# Patient Record
Sex: Female | Born: 1945
Health system: Southern US, Community
[De-identification: ages and names within clinical notes are randomized; demographics above are authoritative.]

## PROBLEM LIST (undated history)

## (undated) DIAGNOSIS — Z860100 Personal history of colon polyps, unspecified: Secondary | ICD-10-CM

## (undated) DIAGNOSIS — I5030 Unspecified diastolic (congestive) heart failure: Secondary | ICD-10-CM

## (undated) DIAGNOSIS — I2510 Atherosclerotic heart disease of native coronary artery without angina pectoris: Secondary | ICD-10-CM

## (undated) DIAGNOSIS — Z8701 Personal history of pneumonia (recurrent): Secondary | ICD-10-CM

## (undated) DIAGNOSIS — Z87898 Personal history of other specified conditions: Secondary | ICD-10-CM

## (undated) DIAGNOSIS — I10 Essential (primary) hypertension: Secondary | ICD-10-CM

## (undated) DIAGNOSIS — L853 Xerosis cutis: Secondary | ICD-10-CM

## (undated) DIAGNOSIS — M549 Dorsalgia, unspecified: Secondary | ICD-10-CM

## (undated) DIAGNOSIS — M069 Rheumatoid arthritis, unspecified: Secondary | ICD-10-CM

## (undated) DIAGNOSIS — R238 Other skin changes: Secondary | ICD-10-CM

## (undated) DIAGNOSIS — E785 Hyperlipidemia, unspecified: Secondary | ICD-10-CM

## (undated) DIAGNOSIS — I6529 Occlusion and stenosis of unspecified carotid artery: Secondary | ICD-10-CM

## (undated) DIAGNOSIS — R05 Cough: Secondary | ICD-10-CM

## (undated) DIAGNOSIS — I739 Peripheral vascular disease, unspecified: Secondary | ICD-10-CM

## (undated) DIAGNOSIS — R059 Cough, unspecified: Secondary | ICD-10-CM

## (undated) DIAGNOSIS — Z720 Tobacco use: Secondary | ICD-10-CM

## (undated) DIAGNOSIS — Z8669 Personal history of other diseases of the nervous system and sense organs: Secondary | ICD-10-CM

## (undated) DIAGNOSIS — J189 Pneumonia, unspecified organism: Secondary | ICD-10-CM

## (undated) DIAGNOSIS — F410 Panic disorder [episodic paroxysmal anxiety] without agoraphobia: Secondary | ICD-10-CM

## (undated) DIAGNOSIS — Z862 Personal history of diseases of the blood and blood-forming organs and certain disorders involving the immune mechanism: Secondary | ICD-10-CM

## (undated) HISTORY — PX: TRIGGER FINGER RELEASE: SHX641

## (undated) HISTORY — PX: BACK SURGERY: SHX140

## (undated) HISTORY — PX: CHOLECYSTECTOMY: SHX55

## (undated) HISTORY — PX: ESOPHAGOGASTRODUODENOSCOPY: SHX1529

## (undated) HISTORY — DX: Personal history of diseases of the blood and blood-forming organs and certain disorders involving the immune mechanism: Z86.2

## (undated) HISTORY — DX: Essential (primary) hypertension: I10

## (undated) HISTORY — DX: Hyperlipidemia, unspecified: E78.5

## (undated) HISTORY — PX: CORONARY ANGIOPLASTY: SHX604

## (undated) HISTORY — PX: ABDOMINAL HYSTERECTOMY: SHX81

## (undated) HISTORY — DX: Unspecified diastolic (congestive) heart failure: I50.30

## (undated) HISTORY — PX: OTHER SURGICAL HISTORY: SHX169

## (undated) HISTORY — PX: CARPAL TUNNEL RELEASE: SHX101

## (undated) HISTORY — DX: Occlusion and stenosis of unspecified carotid artery: I65.29

## (undated) HISTORY — DX: Personal history of other specified conditions: Z87.898

## (undated) HISTORY — PX: COLONOSCOPY: SHX174

## (undated) HISTORY — PX: TOTAL KNEE ARTHROPLASTY: SHX125

## (undated) HISTORY — PX: GIVENS CAPSULE STUDY: SHX5432

## (undated) HISTORY — DX: Personal history of pneumonia (recurrent): Z87.01

---

## 1998-06-09 ENCOUNTER — Encounter: Admission: RE | Admit: 1998-06-09 | Discharge: 1998-09-07 | Payer: Self-pay | Admitting: Anesthesiology

## 1999-03-01 ENCOUNTER — Encounter: Payer: Self-pay | Admitting: Neurological Surgery

## 1999-03-01 ENCOUNTER — Ambulatory Visit (HOSPITAL_COMMUNITY): Admission: RE | Admit: 1999-03-01 | Discharge: 1999-03-01 | Payer: Self-pay | Admitting: Neurological Surgery

## 1999-04-06 ENCOUNTER — Encounter: Payer: Self-pay | Admitting: Neurological Surgery

## 1999-04-08 ENCOUNTER — Encounter: Payer: Self-pay | Admitting: Neurological Surgery

## 1999-04-08 ENCOUNTER — Inpatient Hospital Stay (HOSPITAL_COMMUNITY): Admission: RE | Admit: 1999-04-08 | Discharge: 1999-04-14 | Payer: Self-pay | Admitting: Neurological Surgery

## 1999-05-07 ENCOUNTER — Encounter: Admission: RE | Admit: 1999-05-07 | Discharge: 1999-05-07 | Payer: Self-pay | Admitting: Neurological Surgery

## 1999-05-07 ENCOUNTER — Encounter: Payer: Self-pay | Admitting: Neurological Surgery

## 1999-06-11 ENCOUNTER — Encounter: Payer: Self-pay | Admitting: Neurological Surgery

## 1999-06-11 ENCOUNTER — Encounter: Admission: RE | Admit: 1999-06-11 | Discharge: 1999-06-11 | Payer: Self-pay | Admitting: Neurological Surgery

## 1999-08-06 ENCOUNTER — Ambulatory Visit (HOSPITAL_COMMUNITY): Admission: RE | Admit: 1999-08-06 | Discharge: 1999-08-06 | Payer: Self-pay | Admitting: Neurological Surgery

## 1999-08-06 ENCOUNTER — Encounter: Payer: Self-pay | Admitting: Neurological Surgery

## 2003-03-07 ENCOUNTER — Ambulatory Visit (HOSPITAL_COMMUNITY): Admission: RE | Admit: 2003-03-07 | Discharge: 2003-03-07 | Payer: Self-pay | Admitting: Internal Medicine

## 2003-05-22 ENCOUNTER — Inpatient Hospital Stay (HOSPITAL_COMMUNITY): Admission: AD | Admit: 2003-05-22 | Discharge: 2003-05-23 | Payer: Self-pay | Admitting: Neurology

## 2003-12-22 ENCOUNTER — Ambulatory Visit (HOSPITAL_COMMUNITY): Admission: RE | Admit: 2003-12-22 | Discharge: 2003-12-22 | Payer: Self-pay | Admitting: Cardiovascular Disease

## 2003-12-29 ENCOUNTER — Ambulatory Visit (HOSPITAL_COMMUNITY): Admission: RE | Admit: 2003-12-29 | Discharge: 2003-12-29 | Payer: Self-pay | Admitting: Cardiovascular Disease

## 2004-01-27 ENCOUNTER — Ambulatory Visit (HOSPITAL_COMMUNITY): Admission: RE | Admit: 2004-01-27 | Discharge: 2004-01-28 | Payer: Self-pay | Admitting: Cardiovascular Disease

## 2004-02-18 ENCOUNTER — Emergency Department (HOSPITAL_COMMUNITY): Admission: EM | Admit: 2004-02-18 | Discharge: 2004-02-18 | Payer: Self-pay | Admitting: Emergency Medicine

## 2004-03-09 ENCOUNTER — Emergency Department (HOSPITAL_COMMUNITY): Admission: EM | Admit: 2004-03-09 | Discharge: 2004-03-09 | Payer: Self-pay | Admitting: Emergency Medicine

## 2004-03-21 HISTORY — PX: OTHER SURGICAL HISTORY: SHX169

## 2004-03-25 ENCOUNTER — Ambulatory Visit: Payer: Self-pay | Admitting: Internal Medicine

## 2004-04-09 ENCOUNTER — Ambulatory Visit (HOSPITAL_COMMUNITY): Admission: RE | Admit: 2004-04-09 | Discharge: 2004-04-09 | Payer: Self-pay | Admitting: Internal Medicine

## 2004-04-09 ENCOUNTER — Ambulatory Visit: Payer: Self-pay | Admitting: Internal Medicine

## 2004-07-08 ENCOUNTER — Ambulatory Visit: Payer: Self-pay | Admitting: Internal Medicine

## 2004-07-19 HISTORY — PX: ESOPHAGOGASTRODUODENOSCOPY: SHX1529

## 2004-07-30 ENCOUNTER — Ambulatory Visit: Payer: Self-pay | Admitting: Internal Medicine

## 2004-07-30 ENCOUNTER — Ambulatory Visit (HOSPITAL_COMMUNITY): Admission: RE | Admit: 2004-07-30 | Discharge: 2004-07-30 | Payer: Self-pay | Admitting: Internal Medicine

## 2005-01-24 ENCOUNTER — Ambulatory Visit (HOSPITAL_COMMUNITY): Admission: RE | Admit: 2005-01-24 | Discharge: 2005-01-24 | Payer: Self-pay | Admitting: *Deleted

## 2005-01-31 ENCOUNTER — Observation Stay (HOSPITAL_COMMUNITY): Admission: RE | Admit: 2005-01-31 | Discharge: 2005-02-01 | Payer: Self-pay | Admitting: Cardiovascular Disease

## 2005-01-31 HISTORY — PX: CARDIAC CATHETERIZATION: SHX172

## 2005-10-10 ENCOUNTER — Ambulatory Visit (HOSPITAL_COMMUNITY): Admission: RE | Admit: 2005-10-10 | Discharge: 2005-10-10 | Payer: Self-pay | Admitting: Internal Medicine

## 2006-12-11 ENCOUNTER — Ambulatory Visit (HOSPITAL_COMMUNITY): Admission: RE | Admit: 2006-12-11 | Discharge: 2006-12-11 | Payer: Self-pay | Admitting: Internal Medicine

## 2007-04-25 ENCOUNTER — Inpatient Hospital Stay (HOSPITAL_COMMUNITY): Admission: AD | Admit: 2007-04-25 | Discharge: 2007-04-29 | Payer: Self-pay | Admitting: Internal Medicine

## 2007-04-25 ENCOUNTER — Ambulatory Visit (HOSPITAL_COMMUNITY): Admission: RE | Admit: 2007-04-25 | Discharge: 2007-04-25 | Payer: Self-pay | Admitting: Internal Medicine

## 2007-11-16 ENCOUNTER — Encounter (INDEPENDENT_AMBULATORY_CARE_PROVIDER_SITE_OTHER): Payer: Self-pay | Admitting: Internal Medicine

## 2007-11-21 ENCOUNTER — Ambulatory Visit: Payer: Self-pay | Admitting: Internal Medicine

## 2007-11-21 DIAGNOSIS — E785 Hyperlipidemia, unspecified: Secondary | ICD-10-CM | POA: Insufficient documentation

## 2007-11-21 DIAGNOSIS — S0990XA Unspecified injury of head, initial encounter: Secondary | ICD-10-CM | POA: Insufficient documentation

## 2007-11-22 ENCOUNTER — Encounter (INDEPENDENT_AMBULATORY_CARE_PROVIDER_SITE_OTHER): Payer: Self-pay | Admitting: Internal Medicine

## 2007-11-22 LAB — CONVERTED CEMR LAB
Amphetamine Screen, Ur: NEGATIVE
Barbiturate Quant, Ur: NEGATIVE
Benzodiazepines.: NEGATIVE
Cocaine Metabolites: NEGATIVE
Creatinine,U: 148.1 mg/dL
Marijuana Metabolite: NEGATIVE
Methadone: NEGATIVE
Opiate Screen, Urine: NEGATIVE
Phencyclidine (PCP): NEGATIVE
Propoxyphene: NEGATIVE

## 2008-01-17 ENCOUNTER — Encounter (INDEPENDENT_AMBULATORY_CARE_PROVIDER_SITE_OTHER): Payer: Self-pay | Admitting: Internal Medicine

## 2008-07-29 ENCOUNTER — Ambulatory Visit (HOSPITAL_COMMUNITY): Admission: RE | Admit: 2008-07-29 | Discharge: 2008-07-29 | Payer: Self-pay | Admitting: Internal Medicine

## 2009-10-19 ENCOUNTER — Emergency Department (HOSPITAL_COMMUNITY): Admission: EM | Admit: 2009-10-19 | Discharge: 2009-10-19 | Payer: Self-pay | Admitting: Surgery

## 2010-04-12 ENCOUNTER — Encounter: Payer: Self-pay | Admitting: Internal Medicine

## 2010-08-03 NOTE — H&P (Signed)
Leslie [name], Leslie [name]              ACCOUNT NO.:  [id number]   MEDICAL RECORD NO.:  [id number]          PATIENT TYPE:  INP   LOCATION:  A307                          FACILITY:  [facility]   PHYSICIAN:  Tesfaye D. Felecia Shelling, MD   DATE OF BIRTH:  Jun 28, 1945   DATE OF ADMISSION:  04/25/2007  DATE OF DISCHARGE:  LH                              HISTORY & PHYSICAL   CHIEF COMPLAINT:  Recurrent cough and congestion.   HISTORY OF PRESENT ILLNESS:  This is 65 year old, female patient with a  history of hypertension and hyperlipidemia came to the office with  recurrent cough of 3 days duration.  The patient was initially seen in  the office on April 23, 2007, and diagnosed as a case of possible  acute bronchitis and the patient was started on oral antibiotics and  antihistaminic medications.  She was also given a cough suppressant.  The patient continued the oral antibiotics and the rest of her  medications.  However, her symptoms got worse and the patient was sent  for x-ray.  An x-ray showed left lower lobe pneumonia.  Since the  patient has a history of longstanding tobacco smoking, she was admitted  for IV antibiotics and further evaluation.   REVIEW OF SYSTEMS:  The patient complains of discomfort on her left  chest and generalized weakness and loss of appetite.  No headache, fever  or chills, chest pain, nausea, vomiting, abdominal pain, dysuria,  urgency or frequency of urination.   PAST MEDICAL HISTORY:  1. Hyperlipidemia.  2. Hypertension.  3. History of recurrent bronchitis.  4. Headache.   CURRENT MEDICATIONS:  1. Norvasc 5 mg daily.  2. Lipitor 20 mg p.o. daily.   SOCIAL HISTORY:  The patient is single.  She smokes about one pack per  day.  No history of alcohol or substance abuse.   PHYSICAL EXAMINATION:  GENERAL:  The patient is alert, awake and sick-  looking.  VITAL SIGNS:  Blood pressure 120/70, pulse 106, respiratory rate 26,  temperature 98 degrees Fahrenheit.  HEENT:   Pupils are equal and reactive.  NECK:  Supple.  CHEST:  Decreased air entry.  There are rhonchi and crackles at the left  lung bases.  CARDIAC:  S1, S2 heard, tachycardic.  ABDOMEN:  Soft and lax.  Bowel sounds positive.  No mass or  organomegaly.  EXTREMITIES:  No leg edema.   ASSESSMENT:  1. Left lower lobe pneumonia.  2. History of hypertension.  3. Hyperlipidemia.  4. Nicotine addiction.   PLAN:  Will start the patient on combination of Zithromax and IV  Rocephin.  Will give also nebulizer treatments.  Will continue her  regular medications.      Tesfaye D. Felecia Shelling, MD  Electronically Signed     TDF/MEDQ  D:  04/26/2007  T:  04/27/2007  Job:  [id number]

## 2010-08-06 NOTE — Discharge Summary (Signed)
Leslie [name], Leslie [name]                        ACCOUNT NO.:  [id number]   MEDICAL RECORD NO.:  [id number]                   PATIENT TYPE:  INP   LOCATION:  2009                                 FACILITY:  [facility]   PHYSICIAN:  Pramod P. Pearlean Brownie, MD                 DATE OF BIRTH:  November 15, 1945   DATE OF ADMISSION:  05/22/2003  DATE OF DISCHARGE:  05/23/2003                                 DISCHARGE SUMMARY   DIAGNOSES AT TIME OF DISCHARGE:  1. Numbness secondary to migraine.  2. Dyslipidemia.  3. Tobacco abuse.  4. Hypertension.  5. Back pain.  6. Status post cholecystectomy.  7. Status post hysterectomy.  8. History of back surgery in 2001 by Dr. Danielle Dess.   MEDICATIONS AT TIME OF DISCHARGE:  1. Lotensin/hydrochlorothiazide 10/12.5 one-half tablet daily.  2. Lipitor 10 mg daily.  3. Elavil 25 mg at bedtime.  4. Aspirin 81 mg daily.   STUDIES PERFORMED:  1. CT of the brain on admission showed no significant abnormality, no acute     changes.  2. MRI of the brain shows no acute infarct.  3. MRA of the brain shows no large or medium-size vessels.  4. A 2-D echocardiogram was performed, results pending.  5. Transcranial Doppler performed, results pending.  6. Carotid Doppler showed right 60% to 80% ICA stenosis in the low mid range     of the scale; left, no ICA stenosis, and bilateral vertebral artery flow     antegrade.   LABORATORY STUDIES:  Hemoglobin A1C 6.1. Lipids with cholesterol of 177,  triglycerides 163, HDL 134, and LDL 110.  Homocysteine 11.35. Hemoglobin  12.6, hematocrit 37.6, white blood cell 7.7, and platelet count 348,000.  Sodium 138, potassium 3.5, chloride 103, CO2 26, BUN 13, creatinine 0.6,  glucose 98, calcium 9.5.  Coagulation studies normal. Cardiac enzymes  normal.   HISTORY OF PRESENT ILLNESS:  Leslie [name] is a 65 year old right-handed  black female who developed sudden onset of paresthesia of the right thigh  yesterday morning when she was going to  work. She was unable to go to work  and subsequently developed similar paresthesias affecting the right forearm  as well as the right ring and little fingers.  She presented to [facility]  emergency room and Dr. Rosalia Hammers called and requested transfer since their CT was  not working. She arrived at [facility] where she was evaluated and  admitted for further stroke workup.   The patient does have a history of migraine and as MRI, MRA, and CT were all  negative, it was felt paresthesias were secondary to migraine as they  resolved within 24 hours. The patient had no other identifiable risk factors  other than hyperlipidemia that was present prior to admission and then mild  right ICA stenosis that would just need follow up over time. Will go ahead  and discharge the patient home with  addition of aspirin 81 mg daily for  secondary stroke prevention. The patient did have white matter changes on  her MRI most often seen with history of migraines.   The patient does smoke and she needs to stop. She was counseled to do so and  offered free smoking classes.   CONDITION ON DISCHARGE:  Neurologically normal.   DISCHARGE PLAN:  1. Discharge home.  2. Add aspirin to daily medication regimen.  3. Follow up with Dr. Felecia Shelling in two months.  4. No need for neurological follow-up at this time.      Annie Main, N.P.                         Pramod P. Pearlean Brownie, MD    SB/MEDQ  D:  05/23/2003  T:  05/24/2003  Job:  [id number]   cc:   Pramod P. Pearlean Brownie, MD  Fax: [phone]   Tesfaye D. Felecia Shelling, M.D.  [address]  [place]  [place] [address]  Fax: [phone]

## 2010-08-06 NOTE — Consult Note (Signed)
Leslie [name], Leslie [name]              ACCOUNT NO.:  [id number]   MEDICAL RECORD NO.:  [id number]           PATIENT TYPE:   LOCATION:                                 FACILITY:   PHYSICIAN:  R. Roetta Sessions, M.D. DATE OF BIRTH:  1945/07/03   DATE OF CONSULTATION:  DATE OF DISCHARGE:                                   CONSULTATION   REASON FOR CONSULTATION:  Anemia and rectal bleeding.   HISTORY OF PRESENT ILLNESS:  Leslie [name] is a 65 year old female patient  of Dr. Letitia Neri.  She notes on March 09, 2004 she experienced dizziness at  work.  She was then taken to the emergency room, where she was found to have  anemia with a hemoglobin of 10.5 and a hematocrit of 31 with an MCV of 85.9.  She was evaluated at that time.  The hemoglobin had dropped from 12.7 and a  hematocrit of 40 from November 04, 2003.  Leslie [name] notes she had noticed  rectal bleeding of a moderate amount in her stool.  She notes the bleeding  was bright red.  She also notes external hemorrhoids.  She denied any  associated abdominal pain or melena.  She denied any nausea and vomiting.  She does have occasional heart burn and indigestion.  She had been started  on PPI therapy in November and the Prevacid caused her a rash.  Therefore,  it was discontinued.  She denies any history of odynophagia or dysphagia.  She denies any early satiety.  Notably she has been taking aspirin once  daily, 81 mg daily.  She also has been taking multiple Excedrin Migraine for  migraine headaches, anywhere from one tablet to six tablets daily on a  regular basis.   PAST MEDICAL HISTORY:  1.  Hypertension.  2.  Hypercholesterolemia.  3.  Migraine headaches.  4.  Colonoscopy in 1998 by Dr. Katrinka Blazing with a few polyps.  However, a biopsy      was not performed per the patient's report.   PAST SURGICAL HISTORY:  1.  She is status post PCI and stenting in her right leg.  2.  Status post vaginal hysterectomy about 30 years ago.   CURRENT  MEDICATIONS:  1.  Lotensin 12.5 mg 1/2 tablet daily.  2.  Lipitor 20 mg daily.  3.  Aspirin 81 mg daily.  4.  Excedrin Migraine anywhere from 1-6 tablets daily.  5.  Senokot daily.   ALLERGIES:  1.  PLAVIX.  2.  PREVACID, which caused a rash.   FAMILY HISTORY:  No known family history of colorectal carcinoma, liver, or  chronic GI problems.  Mother is deceased at age 49 secondary to a CVA.  Father is deceased in his early 14s secondary to brain carcinoma.  Multiple  siblings have a history significant for coronary artery disease, diabetes  mellitus, and hypertension.   SOCIAL HISTORY:  Leslie [name] is single.  She lives alone.  She has two  grown healthy children.  She is employed full-time with [employer].  She reports a 1/2 pack per day tobacco use over the  last 30 years.  She  denies any alcohol or drug use.   REVIEW OF SYSTEMS:  CONSTITUTIONAL:  Weight is stable.  Denies any anorexia.  Denies any fatigue.  NEUROLOGIC:  She does have significant migraine  headaches very frequently.  This has been evaluated last year with a  negative CT scan per her report in March.  She is being followed by Dr.  Felecia Shelling for this.  CARDIOVASCULAR:  Denies any chest pain or palpitations.  PULMONARY:  Denies any shortness of breath, dyspnea, cough, or hemoptysis.  GYNECOLOGICAL:  She does note a heavy vaginal discharge.  She has discussed  this with Dr. Felecia Shelling as well.  She notes the discharge is brownish with  possibly some bleeding.  GASTROINTESTINAL:  See HPI.   PHYSICAL EXAMINATION:  VITAL SIGNS:  Weight 151 pounds.  Temperature 97.4,  blood pressure 120/76, pulse 76.  GENERAL:  Leslie [name] is a 65 year old well-developed well-nourished  African American female who is alert, oriented, pleasant, and cooperative in  no acute distress.  HEENT:  Sclerae clear, nonicteric.  Conjunctivae pink.  Oropharynx pink and  moist without any lesions.  She does have upper and lower dentures  intact.  NECK:  Supple without any masses or thyromegaly.  HEART:  Regular rate and rhythm with normal S1 and S2 without any murmurs,  clicks, rubs, or gallops.  LUNGS:  Clear to auscultation bilaterally.  ABDOMEN:  Positive bowel sounds x4.  She does have left lower quadrant and  right lower quadrant soft bruits.  Otherwise abdomen is soft, nontender,  nondistended without palpable masses or hepatosplenomegaly.  No rebound  tenderness or guarding.  RECTAL:  She does have large protruding external hemorrhoids that are not  actively thrombosed or erythematous.  Good sphincter tone.  No internal  masses palpated.  A small amount of stool is Hemoccult negative from the  vault.  EXTREMITIES:  Show 2+ pedal pulses bilaterally.  SKIN:  Brown, warm, and dry without any rashes or jaundice.   LABORATORY DATA:  See HPI.   ASSESSMENT:  Leslie [name] is a 65 year old African American female with  about a 2 g drop in her hemoglobin recently.  Since this episode repeat labs  show her hemoglobin is back up to 11.8.  She is on a significant amount of  aspirin daily, an 81 mg tablet as well as an Excedrin Migraine which has 250  mg of aspirin per tablet.  She notes taking up to six of these per day.  She  is definitely at risk for developing a peptic ulcer disease.  She also has  noted intermittent bright red rectal bleeding with her stools for at least  the last 20 years that she can recall.  I would recommend a further  evaluation to rule out peptic ulcer disease, as well as lower  gastrointestinal bleeding.  She is instructed to discontinue the use of  Excedrin Migraine for now.   RECOMMENDATIONS:  1.  Discontinue Excedrin Migraine.  2.  We will schedule a colonoscopy and EGD with Dr. Jena Gauss in the near      future.  I discussed this procedure including the risks and benefits to      include but not limited to bleeding, infection, perforation, and drug     reaction.  She agrees and a consent will  be obtained.  3.  As far as her vaginal discharge and headaches are concerned she is to      follow up with Dr.  Fanta.  4.  We will hold the PPI for now given the fact that she had a rash with      Prevacid.  5.  Further recommendations pending the procedure and if she develops any      dizziness, profuse bleeding, or any of these signs or symptoms she is to      call our office or go immediately to the emergency room.   We would like to thank Dr. Felecia Shelling for allowing [place] to participate in the care  of Leslie [name].     Kand   KC/MEDQ  D:  03/25/2004  T:  03/25/2004  Job:  [id number]   cc:   Tesfaye D. Felecia Shelling, MD  [address]  [place]  [place] [address]  Fax: [phone]

## 2010-08-06 NOTE — Cardiovascular Report (Signed)
NAMEALANNIS, Leslie [name]              ACCOUNT NO.:  [id number]   MEDICAL RECORD NO.:  [id number]          PATIENT TYPE:  OIB   LOCATION:  2899                         FACILITY:  [facility]   PHYSICIAN:  Nanetta Batty, M.D.   DATE OF BIRTH:  17-Sep-1945   DATE OF PROCEDURE:  01/27/2004  DATE OF DISCHARGE:                              CARDIAC CATHETERIZATION   PROCEDURE:  Peripheral angiogram/PTA and stent procedure.   CARDIOLOGIST:  Nanetta Batty, M.D.   INDEPENDENT MEDICAL EVALUATION:  The patient is a 65 year old  African/American female, a patient of Dr. Ninetta Lights D. Fanta's and Dr. Rod Can.  She has a history of hypertension, hyperlipidemia and chronic  migraines.  She has had a negative Cardiolite, but a history of progressive  right lower extremity claudication.  Dopplers revealed an ABI of 0.87 with  high-grade stenosis, versus occlusion of the mid-right SFA.  She presents  now for an angiography and potential intervention.   DESCRIPTION OF PROCEDURE:  The patient is brought to the sixth floor [facility] Peripheral Vascular Angiographic suite in the post-  absorptive state.  She was premedicated with p.o. Valium and IV Versed.  Her  left groin was prepped and shaved in the usual sterile fashion.  Xylocaine  1% was used for local anesthesia.  A 5-French sheath was inserted into the  left femoral artery using the standard Seldinger technique.  A 5-French  pigtail catheter along with a 5-French cross-over catheter were used for a  mid-stream and distal abdominal aortography, with bifemoral runoff.  Visipaque dye was used for the entirety of the case.  Retrograde aortic  pressures monitored during the case.   ANGIOGRAPHIC RESULTS:  1.  Abdominal aorta      1.  Renal arteries:  A 50% ostial left renal artery stenosis.      2.  Infrarenal abdominal aorta:  Moderate atherosclerosis towards the          iliac bifurcation.  2.  Left lower extremity      1.   Ulcerated plaque at the origin of the left common iliac artery that          was not obstructive, and on the lateral border of the ostium.      2.  A 50% segmental mid-left SFA with three-vessel runoff.  3.  Right lower extremity      1.  First segment occlusion mid-right SFA with three-vessel runoff.   DESCRIPTION OF PROCEDURE:  A contralateral access was obtained with a cross-  over catheter and an 0.035 Wholey followed by a 0.035 diagonal glide wire.  The patient received 3000 units of heparin intravenously.  Following this, a  long 5-French end-hole catheter advanced over the [facility] to the mid-right  SFA.  The wire was exchanged for a 0.035 angled glide wire which was used to  cross the total occlusion into the distal SFA/popliteal.  A PTA was  performed with a 4 x 6 Power Flex using overlapping sequential inflations.  Following this, a 6 x 10 overlapping with 6 x 6 Smart stents were then  deployed  sequentially and post-dilated with a 5 x 8 Power Flex.  There was  some pain in her leg with balloon inflation at low pressures, which resolved  with balloon deflation.  The final angiographic result was a reduction of a  short segment of the mid-right SFA occlusion to 0% residual, with excellent  flow.  The patient tolerated the procedure well.  The contralateral sheath  was then removed, after an ACT was measured at 150.  Pressure was held on  the groin to achieve hemostasis.  The patient left the laboratory in stable  condition.   PLAN:  She be treated with aspirin and Plavix.  She will be hydrated and  discharged home in the morning, and will return to the office in  approximately two weeks after obtaining lower extremity post-procedure  Dopplers.  She left the laboratory in stable condition.       JB/MEDQ  D:  01/27/2004  T:  01/27/2004  Job:  [id number]   cc:   Peripheral Vascular Lab - [facility]   [facility]   Cherly Anderson, M.D.  [place], [place]    Tesfaye D. Felecia Shelling, MD  [address]  [place]  [place] [address]  Fax: [phone]

## 2010-08-06 NOTE — H&P (Signed)
Leslie [name], Leslie [name]              ACCOUNT NO.:  [id number]   MEDICAL RECORD NO.:  [id number]          PATIENT TYPE:  AMB   LOCATION:                                FACILITY:  [facility]   PHYSICIAN:  R. Roetta Sessions, M.D. DATE OF BIRTH:  [birth year]   DATE OF ADMISSION:  DATE OF DISCHARGE:  LH                                HISTORY & PHYSICAL   CHIEF COMPLAINT:  Follow up gastric ulcers.   Last seen April 09, 2004, at which time she was a 65 year old lady who  underwent EGD and colonoscopy for further evaluation of a 2-gram drop in  hemoglobin and hematochezia.  She had been on aspirin and Plavix.  She was  found to have a tiny distal esophageal erosion, prepyloric enteral erosions  and ulcerations.  She had, on colonoscopy polyps, which turned out to be  hyperplastic rectosigmoid.  Remainder of her colonic mucosa to be normal.  She had a titer with positive H. pylori serologies, for which she underwent  triple drug therapy.  She has remained off aspirin and Plavix.  She  continues on Nexium 40 mg daily and has really done very well.  She is here  for consideration of follow-up EGD to document ulcer healing.  Dr. Felecia Shelling  states she needs to go back on Plavix if possible.   PAST MEDICAL HISTORY:  1.  Hypertension.  2.  Hypercholesterolemia.  3.  Migraine headaches.  4.  Colonoscopy in 1998 revealed colonic polyps.   PAST SURGICAL HISTORY:  1.  Status post stenting of right femoral artery.  2.  Status post vaginal hysterectomy.   CURRENT MEDICATIONS:  1.  Lotensin 12.5 mg 1/2 tablet daily.  2.  Lipitor 20 mg daily.  3.  A.S.A. 81 mg daily.  4.  Senokot q.i.d.  5.  Nexium 40 mg daily.   ALLERGIES:  PREVACID.  Prevacid causes a rash.   FAMILY HISTORY:  Negative for chronic GI or liver illness.   SOCIAL HISTORY:  The patient is single.  She lives alone.  She has two  grown, healthy children.  She is employed full time with [employer].  She has been a half-a-pack smoker  daily for 30 years.  No alcohol or drug  use.   REVIEW OF SYSTEMS:  Quite stable.  No chest pain.  No dyspnea.  No fever or  chills.  No change in bowel habits.   PHYSICAL EXAMINATION:  GENERAL APPEARANCE:  A pleasant 65 year old lady,  resting comfortably.  VITAL SIGNS:  Weight 159-1/2 pounds (up 8 pounds).  Height 5-foot-7.  Temp  97.6, BP 118/68, pulse 80.  SKIN:  Warm and dry.  HEENT:  No scleral icterus.  NECK:  JVD is not prominent.  CHEST:  Lungs are clear to auscultation.  CARDIAC:  Regular rate and rhythm without murmur, gallop or rub.  ABDOMEN:  Benign.  Nondistended.  Positive bowel sounds.  Soft and  nontender.  No hepatosplenomegaly.  EXTREMITIES:  No edema.   ASSESSMENT:  Leslie [name] is a 65 year old lady who recently was found  to have multiple enteral  ulcers and erosions, most likely related to H.  pylori and, in part, nonsteroidal use.  She is status post H. pylori  treatment and has been on acid suppression.  She has refrained from taking  antiplatelet therapy.  Hopefully, the ulcers have healed.  We need to  document this as antiplatelet therapy will be an issue in the future.  To  this end, I have offered Leslie [name] and EGD in the very near future.  Potential risks, benefits and alternatives have been reviewed and questions  answered.  She is agreeable to plan to perform EGD very soon.  We will make  further recommendations at that time.      RMR/MEDQ  D:  07/08/2004  T:  07/08/2004  Job:  [id number]   cc:   Tesfaye D. Felecia Shelling, MD  [address]  [place]  [place] [address]  Fax: [phone]

## 2010-08-06 NOTE — Cardiovascular Report (Signed)
NAMEJAVARIA, [name] NO.:  [id number]   MEDICAL RECORD NO.:  [id number]          PATIENT TYPE:  OBV   LOCATION:  6532                         FACILITY:  [facility]   PHYSICIAN:  Nanetta Batty, M.D.   DATE OF BIRTH:  24-Oct-1945   DATE OF PROCEDURE:  01/31/2005  DATE OF DISCHARGE:                              CARDIAC CATHETERIZATION   [name] is a 65 year old African American female, patient of Dr.  Domingo Sep and Dr. Felecia Shelling.  She has a history of hypertension, hyperlipidemia  and continued tobacco abuse.  Because of claudication, peripheral  angiography performed January 27, 2004, revealed short segment occlusion  mid right SFA which is stented using overlapping smart self-expanding  stents.  She did have 50% left renal artery stenosis.  She does continue to  smoke.  Follow-up Dopplers revealed high frequency signal in the right SFA  with progressive right lower extremity claudication.  She presents now for  angiography and potential endovascular therapy.   DESCRIPTION OF PROCEDURE:  Patient was brought to the sixth floor [place].  [facility] Peripheral Vascular Angiographic Suite in the  postabsorptive state.  She was premedicated with IV Versed.  Left groin was  prepped and shaved in the usual sterile fashion.  Xylocaine 1% was used for  local anesthesia.  A 5 French sheath was inserted into the left femoral  artery using standard Seldinger technique.  A 5 [place] tennis racquet  catheter was used for mid stream and distal abdominal aortography along with  bifemoral runoff using digital subtraction bolus chase step table technique.  Isovue dye was used through the entirety of the case.  Retrograde aortic  pressure was monitored during the case.   ANGIOGRAPHIC RESULTS:  1.  Abdominal aorta:      1.  Renal arteries:  Normal.      2.  Infrarenal abdominal aorta:  Moderate atherosclerotic changes.  2.  Left lower extremity:      1.  50 to 60%  segmental mid left SFA stenosis with three vessel runoff.  3.  Right lower extremity:      1.  Diffuse in-stent restenosis with occlusion of the overlapping mid          right SFA stents with three vessel runoff.   PROCEDURE DESCRIPTION:  Contralateral access was obtained with a 7 French  Terumo crossover sheath, anal Glidewire and end hole crossover catheters.  Patient received 5000 units of heparin intravenously.  Dr. Alanda Amass  assisted during the case. The end hole catheter was used for backup and the  [facility] wire was advanced across the occluded segment.  This was exchanged  for a 300 cm length 1.4 Asahi Prowater guidewire.  Following this, a fox  hollow silver hawk LX arthrectomy device was used to atherectomize the in-  stent restenosis.  Multiple passes were performed.  Patient had retrieval  of a significant amount of atherosclerotic burden.  The leading edge of the  stent had high grade restenosis and this was atherectomized with a  suboptimal angiographic result.  Following this, a Cordis Smart 6 x 2  Nitinol  self-expanding stent was then deployed and the proximal edge  overlapping previous stent and post dilated with a 5 x 2 Powerflex resulting  in excellent angiographic result.   IMPRESSION:  Successful fox hollow atherectomy, percutaneous transluminal  angioplasty and stenting of a diffuse in-stent restenosis for functional  limiting claudication.  Patient tolerated the procedure well.  The long 7  French sheath was exchanged for a short 7 [place] sheath.  ACT was measured  and the sheath was removed.  Pressure was  held on the groin to achieve hemostasis.  Patient left the lab in stable  condition.  She will be hydrated overnight, treated with aspirin and Plavix  and discharged home in the morning.  She will get follow-up Dopplers and  ABIs and we will see him back in the office in follow-up.      Nanetta Batty, M.D.  Electronically Signed     JB/MEDQ  D:   01/31/2005  T:  02/01/2005  Job:  [id number]   cc:   6th floor [facility] PV/Angio Suite   [facility]  [address] [place], [place] [address]   Richard A. Alanda Amass, M.D.  Fax: [phone]   Dani Gobble, MD  Fax: [phone]   Tesfaye D. Felecia Shelling, MD  Fax: [phone]

## 2010-08-06 NOTE — H&P (Signed)
NAMEJONIQUE, Leslie [name]                        ACCOUNT NO.:  [id number]   MEDICAL RECORD NO.:  [id number]                   PATIENT TYPE:  INP   LOCATION:  2009                                 FACILITY:  [facility]   PHYSICIAN:  Pramod P. Pearlean Brownie, MD                 DATE OF BIRTH:  February 07, 1946   DATE OF ADMISSION:  05/22/2003  DATE OF DISCHARGE:                                HISTORY & PHYSICAL   REASON FOR ADMISSION:  Stroke.   HISTORY OF PRESENT ILLNESS:  Leslie [name] is a 65 year old lady who  developed sudden onset of paresthesia of the right eye yesterday morning.  She was able to go to work and subsequently developed similar paresthesias  affecting the right forearm as well as right ring and little fingers.  This  persisted throughout the day, and she came to [facility] emergency room  where she could not get an adequate workup because the CT scan was done, and  the hospital did not have beds to admit her.  Dr. Rosalia Hammers called me, and  requested transfer.  The patient said she hae not noticed any weakness in  her hands, blurred vision, double vision or slurred speech.  This morning,  she has noticed a headache which was similar to her migraine headache.  Her  headache has been moderate in intensity, left frontal, throbbing in nature  with some nausea and photophobia.  Previously, her headaches have lasted  about 30 minutes to an hour or so.  She said these headache usually respond  to strong over-the-counter analgesics.  She has been having headaches at a  regular frequency of one per week to one per month.  She has not had similar  paresthesia with any of them previous to today.  She does have significant  vascular risk factors in the form of hypertension, hyperlipidemia, family  history of stroke and smoking.   PAST MEDICAL HISTORY:  1. Hypertension.  2. Hyperlipidemia.  3. Back pain.   PAST SURGICAL HISTORY:  Gallbladder surgery, hysterectomy, back surgery in  2001 by Dr.  Danielle Dess.   CURRENT MEDICATIONS:  1. Lotensin/HCTZ 10/12.5 one-half tablet daily.  2. Lipitor 10 mg a day.  3. Elavil 25 mg at h.s.   MEDICATION ALLERGIES:  None.   SOCIAL HISTORY:  The patient smokes one-half pack per day.  She does not  drink alcohol.  The patient lives in [place].   REVIEW OF SYSTEMS:  Not significant for any recent fever, loss of weight,  cough, chest pain, diarrhea or other illness.   PHYSICAL EXAMINATION:  GENERAL:  Reveals a pleasant, middle-aged lady, not  in distress or febrile.  VITAL SIGNS:  Temperature afebrile.  ___________ Blood pressure 140/80.  ___________.  HEENT:  Head is nontraumatic. HEENT exam is unremarkable.  NECK:  Supple without bruits.  CARDIAC:  Revealed no murmur, gallops or rubs.  LUNGS:  Clear to auscultation.  NEUROLOGIC:  The patient is awake, alert, oriented x3 with normal speech and  language function.  No aphasia, apraxia or dysarthria.  Pupils are equal,  reactive to light and accommodation.  Tongue was midline.  Motor exam  reveals symmetric upper and lower extremity strength and tone.  Reflexes are  depressed in the lower extremities and present in the upper extremities.  Plantars are both downgoing.  Finger-to-nose and ___________ coordination  are accurate.  She has subjective decreased touch and pinprick sensation in  the right upper extremity, and her forearm and involving the index and  little fingers.  Right lower extremities, she has decreased touch and  pinprick sensation from the hip to the knee.  Her gait is steady.   LABORATORY DATA:  CT scan of the head is pending.  Admission labs are pending.  WBC count is  unremarkable.  Cardiac enzymes are negative.   IMPRESSION:  A 65 year old lady with progressive right lower and upper  extremity paresthesias of undetermined etiology.  Possibilities include  small left subcortical or  __________ brain stem infarct.  Presence of  headache this morning. Would favor  complicated migraine.   PLAN:  The patient is admitted for stroke risk stratification workup given  her multiple vascular risk factors.  MRI scan of the brain with MRA of the  brain, carotid and transcranial Dopplers, cardiac echocardiogram and check  hemoglobin A1c, lipid profile and homocystine.  Aspirin for stroke  prevention for now.  Continue Lotensin for hypertension and Lipitor for  hyperlipidemia.  Treat the headache with Toradol 30 mg IM.  If the above  workup is negative, may consider this as a complicated migraine.  I had a  long discussion with the patient regarding the above symptoms, discussed my  plan and evaluation and answered questions.                                                Pramod P. Pearlean Brownie, MD    PPS/MEDQ  D:  05/22/2003  T:  05/22/2003  Job:  [id number]   cc:   Hilario Quarry, M.D.  [address]  [place]  [place] [address]  Fax: [phone]

## 2010-08-06 NOTE — Op Note (Signed)
[name], [name]              ACCOUNT NO.:  [id number]   MEDICAL RECORD NO.:  [id number]          PATIENT TYPE:  AMB   LOCATION:  DAY                           FACILITY:  [facility]   PHYSICIAN:  R. Roetta Sessions, M.D. DATE OF BIRTH:  1945-06-13   DATE OF PROCEDURE:  04/09/2004  DATE OF DISCHARGE:                                 OPERATIVE REPORT   PROCEDURE:  Esophagogastroduodenoscopy, diagnostic, followed by colonoscopy  with biopsy.   INDICATION FOR PROCEDURE:  The patient is a 65 year old lady with a 2 g drop  in her hemoglobin, taking daily aspirin, intermittent hematochezia that has  been small-volume for over 20 years.  It has been some eight years since she  last had a colonoscopy, and she was said to have polyps and was told to  return sooner than now.  She has stopped taking her aspirin supplements.  She had a rash with PREVACID previously.  EGD and colonoscopy are now being  done.  The approach has been discussed with the patient at length, the  potential risks, benefits, and alternatives have been reviewed, questions  answered.  She is agreeable.  Please see the documentation in the medical  record.   PROCEDURE NOTE:  O2 saturation, blood pressure, pulse, and respiration were  monitored throughout the entirety of both procedures.   CONSCIOUS SEDATION:  Versed 5 mg IV, Demerol 100 mg IV in divided doses.   INSTRUMENT USED:  Olympus video chip system.   FINDINGS:  EGD:  Examination of the tubular esophagus revealed  circumferential distal esophageal erosions, mild.  No evidence of Barrett's  esophagus or other abnormality.  EG junction easily traversed.   Stomach:  The gastric cavity was empty and insufflated well with air.  A  thorough examination of the gastric mucosa, including retroflexed view of  the proximal stomach and esophagogastric junction, demonstrated linear  erosions, narrow ulcerated areas in the antrum.  There was a discrete 5 mm  ulcer in the  prepyloric mucosa superior to the pylorus.  There was a mucosal  edema present.  These appeared to be benign lesions, please see photos.  Pylorus patent and easily traversed.  Examination of the bulb and second  portion revealed no abnormalities.   Therapeutic/diagnostic maneuvers performed:  None.   The patient tolerated the procedure well, was prepared for colonoscopy.   Digital rectal examination revealed no abnormalities.   Endoscopic findings:  The prep was adequate.   Rectum:  Examination of the rectal mucosa, including retroflexed view of the  anal verge, revealed only some internal hemorrhoids.   Colon:  The colonic mucosa was surveyed from the rectosigmoid junction,  through the left, transverse and right colon to the area of the appendiceal  orifice, ileocecal valve and cecum.  These structures were well-seen and  photographed for the record.  From this level the scope was slowly withdrawn  and all previously-mentioned mucosal surfaces were again seen.  The patient  had a diminutive 3 mm polyp at 20 cm.  It was cold biopsied/removed.  Aside  from internal hemorrhoids, the remainder of her lower GI  tract appeared  normal.  The patient tolerated both procedures well, was reacted in  endoscopy.   IMPRESSION:  Esophagogastroduodenoscopy:  1.  Tiny distal esophageal erosions consistent with nonerosive reflux      esophagitis, otherwise normal esophagus.  2.  Prepyloric antral erosions/ulcerations as noted above.  The ulcers were      small but present.  The remainder of gastric mucosa appeared normal.  3.  Normal D1, D2.   Colonoscopy findings:  1.  Internal hemorrhoids, otherwise normal rectum.  2.  Diminutive polyp of rectosigmoid junction cold biopsied/removed.  The      remainder of colonic mucosa appeared normal.   RECOMMENDATIONS:  1.  Continue to refrain from taking aspirin products.  2.  H. pylori serologies today.  3.  I am concerned about the rash with  PREVACID.  For now will put her on      Pepcid 20 mg orally b.i.d.  4.  Follow up on pathology.  5.  Follow up on H. pylori serologies.  6.  Further recommendations to follow.      RMR/MEDQ  D:  04/09/2004  T:  04/09/2004  Job:  [id number]   cc:   Tesfaye D. Felecia Shelling, MD  [address]  [place]  [place] [address]  Fax: [phone]

## 2010-08-06 NOTE — Discharge Summary (Signed)
[name], Leslie              ACCOUNT NO.:  [id number]   MEDICAL RECORD NO.:  [id number]          PATIENT TYPE:  OIB   LOCATION:  4705                         FACILITY:  [facility]   PHYSICIAN:  Nanetta Batty, M.D.   DATE OF BIRTH:  [birth year]   DATE OF ADMISSION:  01/27/2004  DATE OF DISCHARGE:  01/28/2004                                 DISCHARGE SUMMARY   [name] came in for elective angiogram secondary to __________  claudication and decrease in her ABIs.  This was performed by Dr. Nanetta Batty.  She was found to have abdominal aorta, had 50% left renal artery  stenosis.  Moderate distal aorta atherosclerosis.  She had a left lower  extremity ulcerated plaque at the origin of the left CIA, 50%, segmental to  her left SFA with three-vessel runoff.  Her right lower extremity had a  short segmental occluded mid right SFA lesion.  She underwent PCI and  stenting to her right SFA reduced from 100% down to zero.  She was seen on  January 28, 2004, by Dr. Nanetta Batty.  Blood pressure was 147/78.  Pulse  was 70.  Respirations were 18.  Temperature was 97.7.  Her labs showed a  hemoglobin of 11.3, hematocrit 32.5, WBC 8.9, and platelets 300.  Sodium  142, potassium 3.6, BUN 6, creatinine 0.7, glucose 87.  She was considered  stable to be discharged home.  She had maintained sinus rhythm on the  monitor.   DISCHARGE MEDICATIONS:  1.  Lotensin/HCTZ 10/12.5 once daily.  2.  Lipitor 20 mg daily.  3.  Prevacid 30 mg daily.  4.  Aspirin 81 mg daily.  5.  Plavix 75 mg once daily.  6.  She may take ibuprofen or Tylenol for pain.   She is not to return to work until November 14.  She will follow up on  November 21 at 4 p.m. for lower extremity Doppler.  She will follow up with  Dr. Allyson Sabal on November 28.  If she has any problems with her groin, she knows  to give [place] a call.   DISCHARGE DIAGNOSES:  1.  Claudication.  Abnormalities ankle brachial indices with subsequent  portal vein angiogram revealing high grade stenosis in the right      superficial femoral artery with subsequent PCI with stenting to her      right superficial      femoral artery.  2.  Hypertension.  3.  Hyperlipidemia.       BB/MEDQ  D:  01/28/2004  T:  01/28/2004  Job:  [id number]   cc:   Tesfaye D. Felecia Shelling, MD  [address]  [place]  [place] [address]  Fax: [phone]   Dani Gobble, MD  Fax: [phone]

## 2010-08-06 NOTE — Discharge Summary (Signed)
[place]. [facility]  Patient:    Leslie [name]                            MRN: [id number] Adm. Date:  [id number] Disc. Date: [id number] Attending:  Jonne Ply                           Discharge Summary  ADMITTING DIAGNOSIS:  Lumbar spondylosis and stenosis, L4 transitional level, with lumbar radiculopathy.  DISCHARGE DIAGNOSIS:  Lumbar spondylosis and stenosis, L4 transitional level, with lumbar radiculopathy.  OPERATION:  Lumbar laminectomy; diskectomy; posterior interbody arthrodesis, L4  transitional level.  Ray cage fixation and posterior interbody grafting with local autograft.  CONDITION ON DISCHARGE:  Improving.  HOSPITAL COURSE:  The patient is a 66 year old individual who was found to have  significant lumbar spondylitic disease.  She is advised regarding surgical decompression and stabilization.  She failed extensive efforts at conservative management.  She was taken to the operating room on the day of admission, April 08, 1999, where she underwent laminectomy, diskectomy, and posterior interbody grafting using a ray cage technique.  She tolerated the procedure well. However, postoperatively she complained of significant exacerbation of radiculopathic pain.  She noted that her back pain seemed to be markedly improved. The radiculopathic pain was poor to respond to starting doses of Neurontin. Baclofen was also tried, however, it was felt that with simple time this should  recover.  Postoperative x-rays demonstrate a good fixation and implantation of er hardware.  She has been increased to 300 mg 3 times a day of Neurontin and is discharged home on this medication.  She will be seen in the office in approximately three weeks time for further followup.  She is given prescriptions for Percocet as needed for pain, which she has used very sparingly in addition o Valium as a muscle relaxer if needed, and Neurontin 300 mg to be taken  3 times  day.  Baclofen was also tried.  This did not seem to affect her pain positively and this medication is discontinued at the time of discharge. DD:  04/13/99 TD:  04/14/99 Job: [id number]

## 2010-08-06 NOTE — H&P (Signed)
[place]. [facility]  Patient:    Leslie [name]                            MRN: [id number] Adm. Date:  [id number] Attending:  Jonne Ply Dictator:   Stefani Dama, M.D.                         History and Physical  ADMISSION DIAGNOSIS:  Lumbar spondylosis at L4-L5 with lumbar radiculopathy and  chronic back pain.  HISTORY OF PRESENT ILLNESS:  The patient is a 65 year old individual who was seen initially in November 1999 because of low back and left leg pain secondary to spondylitic disease and spinal stenosis.  The patient was initially seen and evaluated by [facility] in [facility] at [facility].  A myelogram and post-myelogram scan  showed spondylitic changes bilaterally at the lower levels of the lumbar spine,  most prominent level was actually worse on the right than on the left.  She noted that she had significant feelings of numbness onto the bottoms of her feet, and  generally she note that her pain would radiate down below the levels of her knees. She denied any bowel or bladder dysfunction.  She was seen and treated conservatively initially, then she was seen again by me in November 2000, and after reviewing her films I suggested discography.  The patient had evidence of disc degeneration at the level of the L2-3, L3-4, and L4-5 levels.  She had a transitional L5-S1 which appeared to be fused to the sacrum.  The patient underwent discography at the lower three movable levels, positive concordance was noted at the L4-5 level which was the last mobile segment in this individual.  After careful review of her films, she was advised regarding surgical decompression and stabilization of the L4-5 level, and she is now admitted to the hospital for this procedure.  PAST MEDICAL HISTORY: 1. She had bilateral carpal tunnel surgery in 1988 in [place]. 2. She had  bilateral ulnar nerve surgery done in 1998 by Dr. Simonne Come.  She    obtained excellent results from both procedures. 3. Cholecystectomy was done in 1998.  SOCIAL HISTORY:  She smokes a 1/2 pack of cigarettes a day, and did so for over 20 years.  Drinks alcohol only socially.  Her height and weight have been stable at 170 pounds and 5 feet 7 inches.  She is single.  She works as a Careers adviser at work.  FAMILY HISTORY:  Her mother died of a stroke at age 62 and had hypertension. Father died of brain cancer at age 22.  She has a sister with diabetes.  CURRENT MEDICATIONS: 1. Hydrocodone. 2. Celebrex.  REVIEW OF SYSTEMS:  Notes that the patient wears glasses and she has some sinus  problems, and is otherwise negative.  PHYSICAL EXAMINATION:  GENERAL:  She is alert, oriented, cooperative individual in no overt distress.  NEUROLOGIC:  She flexes forward to a maximum of 70 degrees.  She extends 10 degrees.  Motor strength of the lower extremities reveals iliopsoas, quadriceps, tibialis, and gastrocs have good strength, tone, and bulk in the lower extremities. Deep tendon reflexes are 2+ in the patellae, 1+ in the Achilles.  Babinskis are  downgoing.  Straight leg raising is positive at 60 degrees for primarily back pain, but only minimal complaints of leg pain in  the left lower extremity compared to the right lower extremity.  Patricks maneuver is negative bilaterally.  Sensation is intact to pin and light touch in the distal lower extremities.  There is some suggestion of some tibialis anterior and extensor hallices longus weakness, though the patient is able to heel walk.  In the upper extremities tone and bulk is normal.  Deep tendon reflexes are 2+ in the biceps, 1+ in the triceps, 1+ in the brachial radialis.  Cranial nerve examination reveals the pupils are 4 mm, brisk, reactive to light and accommodation.  Extraocular movements are full with face symmetric.  Grimace,  tongue, and uvula in the midline.  Sclera and conjunctivae are clear.  LUNGS:  Clear to auscultation.  HEART:  Regular rate and rhythm.  ABDOMEN:  Soft, bowel sounds positive, no masses are palpable.  EXTREMITIES:  No clubbing, cyanosis, or edema.  IMPRESSION AND PLAN:  The patient has evidence of a spondylitic disease at the 4-5 level which is the last mobile segment.  She has a positive discogram with concordant pain.  She was advised regarding surgical decompression and stabilization, and is now admitted to the hospital to be taken to the operating  room for this procedure.DD:  04/08/99 TD:  04/08/99 Job: [id number]

## 2010-08-06 NOTE — Discharge Summary (Signed)
NAMEBETHLEHEM, Leslie [name]              ACCOUNT NO.:  [id number]   MEDICAL RECORD NO.:  [id number]          PATIENT TYPE:  INP   LOCATION:  A307                          FACILITY:  [facility]   PHYSICIAN:  Tesfaye D. Felecia Shelling, MD   DATE OF BIRTH:  January 22, 1946   DATE OF ADMISSION:  04/25/2007  DATE OF DISCHARGE:  02/08/2009LH                               DISCHARGE SUMMARY   DISCHARGE DIAGNOSES:  1. Left lower lobe pneumonia.  2. Hypertension.  3. History of recurrent bronchitis.  4. History of tobacco abuse.  5. Hyperlipidemia.  6. Headache.   DISCHARGE MEDICATIONS:  1. Augmentin 500 mg p.o. t.i.d. for 5 days.  2. Caduet 05/20 one tablet p.o. daily.  3. Aspirin 325 mg p.o. daily.   DISPOSITION:  The patient was discharged to home in stable condition.   HOSPITAL COURSE:  This is a 65 year old female patient with a history of  hypertension and recurrent bronchitis who was admitted due to her left  lower lobe pneumonia.  The patient initially was treated as an  outpatient with oral antibiotics for about 3 days.  However, the  patient's condition got worse.  Her chest x-ray showed a new left lower  lobe infiltrate.  She has a history of tobacco abuse.  The patient was  admitted and treated with IV antibiotics.  The patient gradually  improved and she was back to her baseline.  She was discharged to home  in stable condition.  The patient was strongly advised to stop tobacco  smoking.      Tesfaye D. Felecia Shelling, MD  Electronically Signed     TDF/MEDQ  D:  05/29/2007  T:  05/29/2007  Job:  [id number]

## 2010-08-06 NOTE — Op Note (Signed)
[name], RUSSI              ACCOUNT NO.:  [id number]   MEDICAL RECORD NO.:  [id number]          PATIENT TYPE:  AMB   LOCATION:  DAY                           FACILITY:  [facility]   PHYSICIAN:  R. Roetta Sessions, M.D. DATE OF BIRTH:  05-30-1945   DATE OF PROCEDURE:  07/30/2004  DATE OF DISCHARGE:                                 OPERATIVE REPORT   PROCEDURE:  Surveillance esophagogastroduodenoscopy.   INDICATIONS FOR PROCEDURE:  A 65 year old lady was found to have multiple  antral ulcers previously related to aspirin, Plavix, and Helicobacter  pylori. Helicobacter pylori has been treated. She has refrained from taking  aspirin and an another antiplatelet therapy. She has been on Nexium 40 mg  daily. She comes for surveillance to document the ulcer has healed. This  approach has been discussed with the patient. Potential risks, benefits, and  alternatives have been reviewed and questions answered.   PROCEDURE NOTE:  O2 saturation, blood pressure, pulse, and respirations were  monitored throughout the entire procedure. Conscious sedation with Versed 3  mg IV and Demerol 75 mg IV in divided doses.   INSTRUMENT:  Olympus video chip system.   FINDINGS:  Examination of the tubular esophagus revealed no mucosal  abnormalities. EG junction easily traversed.   Stomach:  Gastric cavity was empty and insufflated well with air. Thorough  examination of gastric mucosa including retroflexed view of the proximal  stomach and esophagogastric junction demonstrated normal gastric mucosa.  Previously noted ulcers have completed healed. Pylorus was patent and easily  traversed. Examination of the bulb and second portion revealed no  abnormalities.   THERAPEUTIC/DIAGNOSTIC MANEUVERS:  None.   The patient tolerated the procedure well and was reactive to endoscopy.   IMPRESSION:  Normal esophagus, stomach, D1 and D2. These findings are  reassuring.   RECOMMENDATIONS:  1.  Would continue Nexium  40 mg orally daily, particularly if antiplatelet      therapy in the way of Plavix is      restarted. If she does go back on antiplatelet therapy, she has to      accept some increased risk in recurrent peptic ulcer disease/bleeding.  2.  She is to follow up with Dr. Felecia Shelling.      RMR/MEDQ  D:  07/30/2004  T:  07/30/2004  Job:  [id number]   cc:   Tesfaye D. Felecia Shelling, MD  [address]  [place]  [place] [address]  Fax: [phone]

## 2010-08-06 NOTE — Op Note (Signed)
[place]. [facility]  Patient:    Leslie [name]                            MRN: [id number] Proc. Date: 04/08/99 Adm. Date:  [id number] Attending:  Jonne Ply                           Operative Report  PREOPERATIVE DIAGNOSIS:  Lumbar spondylosis, L4-5, with lumbar radiculopathy.  POSTOPERATIVE DIAGNOSIS:  Lumbar spondylosis, L4-5, with lumbar radiculopathy.  PROCEDURE:  Bilateral laminotomy and diskectomy.  Posterior interbody arthrodesis with Ray cage technique and Lumpo autograft.  SURGEON:  Stefani Dama, M.D.  FIRST ASSISTANT:  Hewitt Shorts, M.D.  ANESTHESIA:  General endotracheal.  INDICATIONS:  The patient is a 65 year old individual who has had significant back pain, with positive discogramic concordant pain.  She has significant radicular  complaints.  She was advised regarding surgical decompression and stabilization  using a Ray cage technique and possible consideration of supplemental fixation.  This was not used in this patient, as at the time of surgery distraction yielded a tight construct with Ray cage arthrodesis.  DESCRIPTION OF PROCEDURE:  The patient was brought to operating room supine on he stretcher.  After smooth induction of general endotracheal anesthesia she was turned prone.  Her back was shaved, prepped with DuraPrep, draped in a sterile fashion.  A midline incision was created and carried down to the lumbodorsal fascia.  First identifiable spinous process was noted to be that of the L4/transitional level.  Dissection was then carried down to expose the internal  laminal spaces of the L4 transitional level, and dissection was carried out laterally.  The facets were noted to be very broad and dense and there was noted to a significant stenotic lesion at this level on a previously obtained myelogram.  Laminectomy was then performed, removing the lateral margin of the lamina out to and including the  facet joint.  The facet joint was severely overgrown, and this seemed to compress the underlying or L5 or S1 nerve root down below the level of the disk space.  This was elevated and decompressed, the dissection was carried  laterally.  The nerve root was mobilized so that it could be retracted medially. The underlying disk space was noted to be protuberant and bulging posteriorly, ith some significant osteophyte formation at this level.  Then diskectomy was performed, opening the posterior longitudinal ligament with a #15 blade.  A combination of curettes and rongeurs was used to dissect the disk and perform a  complete diskectomy.  Osteophyte rongeurs were used to take down the osteophytes from the superior end plate of S1 and the inferior end plate of L5.  Dissection was then continued on the opposite side, where similar procedure was carried out. n the end the disk space was completely evacuated.  A disk spreader was placed into the interspace, and then on the right side the disk space was then cemented with 14 mm tang and cage instrumentation.  Subsequently the patient underwent placement of 16 x 21 mm cage on the right side.  Similar procedure was carried out on the  left side.  Bone that was harvested from the laminectomy was cut into small pieces and packed.  The plastic end caps were then applied.  The external lateral space that was also decompressed of disk material was then packed with the remainder  f the bone.  Care was taken to make sure that the L5 nerve root superiorly, the S1 nerve roots inferiorly were well decompressed at the foramen.  With this being secured, hemostasis was achieved.  Two small subcutaneous fat pads were placed ver the common dural tube and the takeoff of the nerve roots, and then the lumbodorsal fascia was reapproximated with #1 Vicryl interrupted fashion.  Vicryl 2.0 was used in subcutaneous, subcuticular tissues.  Steri-Strips were  placed on the skin.  The patient tolerated the procedure well and was returned to the recovery room n stable condition. DD:  04/08/99 TD:  04/08/99 Job: [id number]

## 2010-12-10 LAB — DIFFERENTIAL
Band Neutrophils: 3
Basophils Absolute: 0
Basophils Relative: 0
Basophils Relative: 0
Blasts: 0
Eosinophils Absolute: 0
Eosinophils Relative: 0
Eosinophils Relative: 0
Lymphocytes Relative: 13
Lymphocytes Relative: 23
Lymphs Abs: 1.6
Metamyelocytes Relative: 1
Monocytes Absolute: 1.3 — ABNORMAL HIGH
Monocytes Relative: 11
Monocytes Relative: 5
Myelocytes: 1
Neutro Abs: 9.6 — ABNORMAL HIGH
Neutrophils Relative %: 67
Neutrophils Relative %: 76
Promyelocytes Absolute: 0
nRBC: 0

## 2010-12-10 LAB — CBC
HCT: 30.4 — ABNORMAL LOW
HCT: 32.3 — ABNORMAL LOW
Hemoglobin: 10.3 — ABNORMAL LOW
Hemoglobin: 11 — ABNORMAL LOW
MCHC: 33.8
MCHC: 34.1
MCV: 85.1
MCV: 85.2
Platelets: 274
Platelets: 428 — ABNORMAL HIGH
RBC: 3.57 — ABNORMAL LOW
RBC: 3.79 — ABNORMAL LOW
RDW: 14.7
RDW: 15.1
WBC: 11 — ABNORMAL HIGH
WBC: 12.7 — ABNORMAL HIGH

## 2010-12-10 LAB — BASIC METABOLIC PANEL
BUN: 5 — ABNORMAL LOW
BUN: 8
CO2: 27
CO2: 27
Calcium: 8.6
Calcium: 8.8
Chloride: 104
Chloride: 99
Creatinine, Ser: 0.74
Creatinine, Ser: 0.76
GFR calc Af Amer: >60
GFR calc Af Amer: >60
GFR calc non Af Amer: >60
GFR calc non Af Amer: >60
Glucose, Bld: 112 — ABNORMAL HIGH
Glucose, Bld: 124 — ABNORMAL HIGH
Potassium: 3.5
Potassium: 4.1
Sodium: 136
Sodium: 137

## 2010-12-10 LAB — CULTURE, RESPIRATORY W GRAM STAIN: Culture: NORMAL

## 2010-12-10 LAB — EXPECTORATED SPUTUM ASSESSMENT W GRAM STAIN, RFLX TO RESP C

## 2011-10-03 ENCOUNTER — Emergency Department (HOSPITAL_COMMUNITY): Payer: Medicaid Other

## 2011-10-03 ENCOUNTER — Inpatient Hospital Stay (HOSPITAL_COMMUNITY)
Admission: EM | Admit: 2011-10-03 | Discharge: 2011-10-04 | DRG: 149 | Disposition: A | Discharge status: Home / Self Care | Payer: Medicaid Other | Attending: Cardiovascular Disease | Admitting: Cardiovascular Disease

## 2011-10-03 ENCOUNTER — Inpatient Hospital Stay (HOSPITAL_COMMUNITY): Payer: Medicaid Other

## 2011-10-03 ENCOUNTER — Encounter (HOSPITAL_COMMUNITY): Payer: Self-pay | Admitting: Emergency Medicine

## 2011-10-03 DIAGNOSIS — Z9114 Patient's other noncompliance with medication regimen: Secondary | ICD-10-CM

## 2011-10-03 DIAGNOSIS — R9431 Abnormal electrocardiogram [ECG] [EKG]: Secondary | ICD-10-CM

## 2011-10-03 DIAGNOSIS — R42 Dizziness and giddiness: Secondary | ICD-10-CM | POA: Diagnosis present

## 2011-10-03 DIAGNOSIS — E785 Hyperlipidemia, unspecified: Secondary | ICD-10-CM | POA: Diagnosis present

## 2011-10-03 DIAGNOSIS — I517 Cardiomegaly: Secondary | ICD-10-CM | POA: Diagnosis present

## 2011-10-03 DIAGNOSIS — Z9119 Patient's noncompliance with other medical treatment and regimen: Secondary | ICD-10-CM

## 2011-10-03 DIAGNOSIS — E669 Obesity, unspecified: Secondary | ICD-10-CM | POA: Diagnosis present

## 2011-10-03 DIAGNOSIS — I10 Essential (primary) hypertension: Secondary | ICD-10-CM | POA: Diagnosis present

## 2011-10-03 DIAGNOSIS — Z720 Tobacco use: Secondary | ICD-10-CM | POA: Diagnosis present

## 2011-10-03 DIAGNOSIS — Z91199 Patient's noncompliance with other medical treatment and regimen due to unspecified reason: Secondary | ICD-10-CM

## 2011-10-03 DIAGNOSIS — I161 Hypertensive emergency: Secondary | ICD-10-CM

## 2011-10-03 DIAGNOSIS — M069 Rheumatoid arthritis, unspecified: Secondary | ICD-10-CM | POA: Diagnosis present

## 2011-10-03 DIAGNOSIS — I739 Peripheral vascular disease, unspecified: Secondary | ICD-10-CM | POA: Diagnosis present

## 2011-10-03 DIAGNOSIS — I701 Atherosclerosis of renal artery: Secondary | ICD-10-CM | POA: Diagnosis present

## 2011-10-03 DIAGNOSIS — F17200 Nicotine dependence, unspecified, uncomplicated: Secondary | ICD-10-CM | POA: Diagnosis present

## 2011-10-03 HISTORY — DX: Rheumatoid arthritis, unspecified: M06.9

## 2011-10-03 HISTORY — DX: Tobacco use: Z72.0

## 2011-10-03 HISTORY — DX: Essential (primary) hypertension: I10

## 2011-10-03 HISTORY — DX: Peripheral vascular disease, unspecified: I73.9

## 2011-10-03 LAB — URINE MICROSCOPIC-ADD ON

## 2011-10-03 LAB — URINALYSIS, ROUTINE W REFLEX MICROSCOPIC
Bilirubin Urine: NEGATIVE
Leukocytes, UA: NEGATIVE
Nitrite: NEGATIVE
Specific Gravity, Urine: 1.01 (ref 1.005–1.030)
pH: 6.5 (ref 5.0–8.0)

## 2011-10-03 LAB — CBC WITH DIFFERENTIAL/PLATELET
Eosinophils Absolute: 0 10*3/uL (ref 0.0–0.7)
Lymphocytes Relative: 24 % (ref 12–46)
Lymphs Abs: 1.9 10*3/uL (ref 0.7–4.0)
Neutrophils Relative %: 69 % (ref 43–77)
Platelets: 282 10*3/uL (ref 150–400)
RBC: 4.93 MIL/uL (ref 3.87–5.11)
WBC: 7.9 10*3/uL (ref 4.0–10.5)

## 2011-10-03 LAB — COMPREHENSIVE METABOLIC PANEL
ALT: 10 U/L (ref 0–35)
Alkaline Phosphatase: 87 U/L (ref 39–117)
CO2: 29 mEq/L (ref 19–32)
GFR calc Af Amer: >90 mL/min (ref >90)
Glucose, Bld: 121 mg/dL — ABNORMAL HIGH (ref 70–99)
Potassium: 4.7 mEq/L (ref 3.5–5.1)
Sodium: 139 mEq/L (ref 135–145)
Total Protein: 8.4 g/dL — ABNORMAL HIGH (ref 6.0–8.3)

## 2011-10-03 LAB — CARDIAC PANEL(CRET KIN+CKTOT+MB+TROPI)
Relative Index: INVALID (ref 0.0–2.5)
Relative Index: INVALID (ref 0.0–2.5)
Total CK: 70 U/L (ref 7–177)

## 2011-10-03 LAB — PRO B NATRIURETIC PEPTIDE: Pro B Natriuretic peptide (BNP): 758.3 pg/mL — ABNORMAL HIGH (ref 0–125)

## 2011-10-03 LAB — POCT I-STAT TROPONIN I

## 2011-10-03 MED ORDER — MORPHINE SULFATE 4 MG/ML IJ SOLN
4.0000 mg | Freq: Once | INTRAMUSCULAR | Status: AC
  Administered 2011-10-03: 4 mg via INTRAVENOUS
  Filled 2011-10-03: qty 1

## 2011-10-03 MED ORDER — FUROSEMIDE 10 MG/ML IJ SOLN
40.0000 mg | Freq: Once | INTRAMUSCULAR | Status: AC
  Administered 2011-10-03: 40 mg via INTRAVENOUS
  Filled 2011-10-03: qty 4

## 2011-10-03 MED ORDER — ONDANSETRON HCL 4 MG/2ML IJ SOLN: 4.0000 mg | Freq: Four times a day (QID) | INTRAMUSCULAR | Status: DC | PRN

## 2011-10-03 MED ORDER — LABETALOL HCL 5 MG/ML IV SOLN
20.0000 mg | Freq: Once | INTRAVENOUS | Status: AC
  Administered 2011-10-03: 20 mg via INTRAVENOUS
  Filled 2011-10-03: qty 4

## 2011-10-03 MED ORDER — SODIUM CHLORIDE 0.9 % IV SOLN: INTRAVENOUS | Status: DC | PRN

## 2011-10-03 MED ORDER — GADOBENATE DIMEGLUMINE 529 MG/ML IV SOLN
15.0000 mL | Freq: Once | INTRAVENOUS | Status: AC | PRN
  Administered 2011-10-03: 15 mL via INTRAVENOUS

## 2011-10-03 MED ORDER — SODIUM CHLORIDE 0.9 % IJ SOLN
3.0000 mL | Freq: Two times a day (BID) | INTRAMUSCULAR | Status: DC
  Administered 2011-10-03 – 2011-10-04 (×2): 3 mL via INTRAVENOUS

## 2011-10-03 MED ORDER — NITROGLYCERIN 0.4 MG SL SUBL
0.4000 mg | SUBLINGUAL_TABLET | SUBLINGUAL | Status: DC | PRN
  Administered 2011-10-03 (×2): 0.4 mg via SUBLINGUAL
  Filled 2011-10-03: qty 25

## 2011-10-03 MED ORDER — ATORVASTATIN CALCIUM 10 MG PO TABS
10.0000 mg | ORAL_TABLET | Freq: Every day | ORAL | Status: DC
  Administered 2011-10-04: 10 mg via ORAL
  Filled 2011-10-03: qty 1

## 2011-10-03 MED ORDER — ASPIRIN EC 325 MG PO TBEC
325.0000 mg | DELAYED_RELEASE_TABLET | Freq: Every day | ORAL | Status: DC
  Administered 2011-10-03 – 2011-10-04 (×2): 325 mg via ORAL
  Filled 2011-10-03 (×2): qty 1

## 2011-10-03 MED ORDER — LABETALOL HCL 5 MG/ML IV SOLN
2.0000 mg/min | INTRAVENOUS | Status: DC
  Administered 2011-10-04: 2 mg/min via INTRAVENOUS
  Filled 2011-10-03 (×2): qty 100

## 2011-10-03 MED ORDER — LABETALOL HCL 5 MG/ML IV SOLN
2.0000 mg/min | INTRAVENOUS | Status: DC
  Administered 2011-10-03: 2 mg/min via INTRAVENOUS
  Filled 2011-10-03 (×2): qty 100

## 2011-10-03 NOTE — H&P (Signed)
Leslie [name] is an 66 y.o. female.   Chief Complaint: Dizziness Cardiologist:  Dr. Allyson Sabal HPI:   The patient is a 66 yo AA female with a history of tobacco abuse and peripheral vascular disease and had a PV angio in 01/2004 which revealed 50% left renal artery stenosis, 50% segmental mid-left SFA with three vessel run-off and first segment occlusion mid-right SFA with three-vessel runoff.  A 6 x 10 overlapping with a 6 x 6 Smart stents were placed sequentially.  She subsequently had instent re-stenosis November 2006.  The leading edge of the stent had high grade restenosis and this was atherectomized with a suboptimal angiographic result.  A Cordis Smart 6 x 2 Nitinol self-expanding stent was then deployed at the proximal edge overlapping previous stent.  Her history also includes hypertension, hyperlipidemia, headaches and recurrent bronchitis.  She has not seen a PCP in over a year and has not seen Dr. Allyson Sabal since the second PV angio in 2006.  The patient developed dizziness over the weekend which got worse today.  She walked out to take the trash out and while standing by the trash can, thought he was going to pass-out.  She was able to hold on to the can to steady herself.  She reports not taking her usual meds due to cost.   She also reports nausea and vomiting today as well as left inguinal pain which has been on-going for a year.  Her BP in the ER was 229/102.   She denies CP, SOB, orthopnea, abd pain, palpitations,  cough, congestion, claudication.  MRA of her neck revealed high-grade right ICA stenosis 2 cm beyond its origin, with focal loss of flow signal. The right ICA remains patent beyond this level.     Past Medical History  Diagnosis Date  . PAD (peripheral artery disease)   . HTN (hypertension), benign   . Tobacco abuse     Past Surgical History  Procedure Date  . Back surgery   . Leg stents     History reviewed. No pertinent family history. Social History:  reports that she has  been smoking Cigarettes.  She has a 25 pack-year smoking history. She does not have any smokeless tobacco history on file. She reports that she drinks alcohol. She reports that she does not use illicit drugs.  Allergies:  Allergies  Allergen Reactions  . Latex Other (See Comments)    Burns skin   . Tape Other (See Comments)    Burns skin     Medications Prior to Admission  Medication Sig Dispense Refill  . acetaminophen (TYLENOL) 325 MG tablet Take 650 mg by mouth every 6 (six) hours as needed. Pain        Results for orders placed during the hospital encounter of 10/03/11 (from the past 48 hour(s))  CBC WITH DIFFERENTIAL     Status: Normal   Collection Time   10/03/11 11:58 AM      Component Value Range Comment   WBC 7.9  4.0 - 10.5 K/uL    RBC 4.93  3.87 - 5.11 MIL/uL    Hemoglobin 14.1  12.0 - 15.0 g/dL    HCT 57.8  46.9 - 62.9 %    MCV 86.2  78.0 - 100.0 fL    MCH 28.6  26.0 - 34.0 pg    MCHC 33.2  30.0 - 36.0 g/dL    RDW 52.8  41.3 - 24.4 %    Platelets 282  150 - 400 K/uL  Neutrophils Relative 69  43 - 77 %    Neutro Abs 5.5  1.7 - 7.7 K/uL    Lymphocytes Relative 24  12 - 46 %    Lymphs Abs 1.9  0.7 - 4.0 K/uL    Monocytes Relative 6  3 - 12 %    Monocytes Absolute 0.5  0.1 - 1.0 K/uL    Eosinophils Relative 0  0 - 5 %    Eosinophils Absolute 0.0  0.0 - 0.7 K/uL    Basophils Relative 0  0 - 1 %    Basophils Absolute 0.0  0.0 - 0.1 K/uL   URINALYSIS, ROUTINE W REFLEX MICROSCOPIC     Status: Abnormal   Collection Time   10/03/11 12:32 PM      Component Value Range Comment   Color, Urine YELLOW  YELLOW    APPearance CLEAR  CLEAR    Specific Gravity, Urine 1.010  1.005 - 1.030    pH 6.5  5.0 - 8.0    Glucose, UA NEGATIVE  NEGATIVE mg/dL    Hgb urine dipstick SMALL (*) NEGATIVE    Bilirubin Urine NEGATIVE  NEGATIVE    Ketones, ur NEGATIVE  NEGATIVE mg/dL    Protein, ur NEGATIVE  NEGATIVE mg/dL    Urobilinogen, UA 0.2  0.0 - 1.0 mg/dL    Nitrite NEGATIVE   NEGATIVE    Leukocytes, UA NEGATIVE  NEGATIVE   URINE MICROSCOPIC-ADD ON     Status: Abnormal   Collection Time   10/03/11 12:32 PM      Component Value Range Comment   Squamous Epithelial / LPF FEW (*) RARE    RBC / HPF 3-6  <3 RBC/hpf    Bacteria, UA FEW (*) RARE   COMPREHENSIVE METABOLIC PANEL     Status: Abnormal   Collection Time   10/03/11 12:45 PM      Component Value Range Comment   Sodium 139  135 - 145 mEq/L    Potassium 4.7  3.5 - 5.1 mEq/L    Chloride 101  96 - 112 mEq/L    CO2 29  19 - 32 mEq/L    Glucose, Bld 121 (*) 70 - 99 mg/dL    BUN 11  6 - 23 mg/dL    Creatinine, Ser 1.61  0.50 - 1.10 mg/dL    Calcium 09.6  8.4 - 10.5 mg/dL    Total Protein 8.4 (*) 6.0 - 8.3 g/dL    Albumin 3.9  3.5 - 5.2 g/dL    AST 17  0 - 37 U/L    ALT 10  0 - 35 U/L    Alkaline Phosphatase 87  39 - 117 U/L    Total Bilirubin 0.3  0.3 - 1.2 mg/dL    GFR calc non Af Amer 90 (*) >90 mL/min    GFR calc Af Amer >90  >90 mL/min   CARDIAC PANEL(CRET KIN+CKTOT+MB+TROPI)     Status: Normal   Collection Time   10/03/11 12:45 PM      Component Value Range Comment   Total CK 72  7 - 177 U/L    CK, MB 2.4  0.3 - 4.0 ng/mL    Troponin I <0.30  <0.30 ng/mL    Relative Index RELATIVE INDEX IS INVALID  0.0 - 2.5   D-DIMER, QUANTITATIVE     Status: Abnormal   Collection Time   10/03/11 12:45 PM      Component Value Range Comment   D-Dimer, Quant 0.49 (*)  0.00 - 0.48 ug/mL-FEU   POCT I-STAT TROPONIN I     Status: Normal   Collection Time   10/03/11  3:54 PM      Component Value Range Comment   Troponin i, poc 0.00  0.00 - 0.08 ng/mL    Comment 3             Mr [name] Wo Contrast  10/03/2011  *RADIOLOGY REPORT*  Clinical Data:  66 year old female with fatigue, dizziness, numbness.  Dizziness and weakness.  Comparison: Brain MRI and MRA 05/22/2003.  MRI HEAD WITHOUT CONTRAST  Technique: Multiplanar, multiecho pulse sequences of the brain and surrounding structures were obtained according to standard  protocol without intravenous contrast.  Findings:  No restricted diffusion to suggest acute infarction.  No midline shift, mass effect, evidence of mass lesion, ventriculomegaly, extra-axial collection or acute intracranial hemorrhage.  Cervicomedullary junction within normal limits.  Major intracranial vascular flow voids are stable.  Patchy confluent cerebral white matter T2 and FLAIR hyperintensity has progressed since 2005 but remains in a similar nonspecific configuration. There is bilateral temporal lobe involvement.  There has been superimposed the generalized cerebral volume loss.  There are small number of chronic lacunar infarcts in the left basal ganglia. Other deep gray matter nuclei are within normal limits.  The comparatively mild T2 and FLAIR hyperintensity in the pontine white matter.  Cerebellum and visualized cervical spine are within normal limits for age.  Chronic adenoid soft tissue hypertrophy is stable.  Partially empty sella configuration has increased.  Stable and normal bone marrow signal. Visualized orbit soft tissues are within normal limits. Mild paranasal sinus mucosal thickening similar to the prior study. Mastoids are clear.  Grossly stable and normal internal auditory structures.  Negative scalp soft tissues.  IMPRESSION: 1. No acute intracranial abnormality. 2.  Progression of nonspecific white matter signal changes and volume loss since 2005.  White matter disease is severe for age. Favor chronic small vessel disease with main differential consideration of chronic demyelination, multiple sclerosis. 3.  See MRA findings below.  MRA NECK WITHOUT AND WITH CONTRAST  Technique:  Angiographic images of the neck were obtained using MRA technique without and with intravenous contrast.  Carotid stenosis measurements (when applicable) are obtained utilizing NASCET criteria, using the distal internal carotid diameter as the denominator.  Contrast:  . 15 ml MultiHance.  Findings:  Precontrast  time-of-flight imaging reveals antegrade flow in both carotid and vertebral arteries.  Three-vessel arch configuration.  Mild to moderate irregularity of the great vessel origins compatible with atherosclerosis. Subsequent common carotid artery origin stenosis is less than 50 % with respect to the distal vessel.   Beyond its origin the right common carotid artery is normal to the bifurcation.  At the bifurcation there is circumferential irregularity compatible with plaque and subsequent stenosis just proximal to the ECA and ICA origin.  There is more severe right ICA stenosis approximately 20 mm beyond its origin, or focal flow gap occurs (series 44 image 6).  This area is partially obscured by prominent ECA branches.  The right ICA remains patent and beyond this level is within normal limits to the skull base.  The left common carotid artery is only mildly irregular beyond its origin.  There is mild plaque and less than 50% narrowing of the left ICA origin.  There is incidental severe stenosis of the left ECA origin.  There is mild irregularity of the left ICA bulb, otherwise the cervical left ICA is within normal limits.  Codominant distal vertebral arteries.  Both vertebral artery origins are patent.  Flow signal in the left proximal vertebral V1 segment is asymmetrically decreased such that a moderate V1 stenosis is suspected.  The V2 segments are symmetric with mild irregularity.  The distal vertebral arteries in the neck are within normal limits.  There may be mild stenosis at the left vertebrobasilar junction.  IMPRESSION: 1.  High-grade right ICA stenosis 2 cm beyond its origin, with focal loss of flow signal.  The right ICA remains patent beyond this level. 2.  No significant left carotid stenosis in the neck (except for incidental left ECA stenosis). 3.  Moderate proximal left vertebral artery stenosis. 4.  Intracranial MRA findings are below.  MRA HEAD WITHOUT CONTRAST  Technique: Angiographic images of  the Circle of Willis were obtained using MRA technique without  intravenous contrast.  Findings:  Antegrade flow in the posterior circulation with relatively codominant distal vertebral arteries.  Normal left PICA. Normal vertebrobasilar junction.  Normal AICA vessels.  Mild basilar artery irregularity without stenosis.  Superior cerebellar artery and PCA origins are within normal limits.  Posterior communicating arteries are diminutive or absent.  Bilateral PCA branches are within normal limits.  Antegrade flow in both ICA siphons.  Ophthalmic artery origins are within normal limits.  Mild ICA irregularity appears stable.  No ICA stenosis.  Stable carotid termini.  MCA and left ACA origins are within normal limits.  Nondominant right ACA A1 segment is stable.  Anterior communicating artery and visualized ACA branches appear stable and within normal limits.  MCA M1 segment are within normal limits.  Visualized bilateral MCA branches are stable and within normal limits.  IMPRESSION: Stable intracranial MRA since 2005 with mild if any intracranial atherosclerosis.  Original Report Authenticated By: Harley Hallmark, M.D.   Mr Angiogram Neck W Wo Contrast  10/03/2011  *RADIOLOGY REPORT*  Clinical Data:  66 year old female with fatigue, dizziness, numbness.  Dizziness and weakness.  Comparison: Brain MRI and MRA 05/22/2003.  MRI HEAD WITHOUT CONTRAST  Technique: Multiplanar, multiecho pulse sequences of the brain and surrounding structures were obtained according to standard protocol without intravenous contrast.  Findings:  No restricted diffusion to suggest acute infarction.  No midline shift, mass effect, evidence of mass lesion, ventriculomegaly, extra-axial collection or acute intracranial hemorrhage.  Cervicomedullary junction within normal limits.  Major intracranial vascular flow voids are stable.  Patchy confluent cerebral white matter T2 and FLAIR hyperintensity has progressed since 2005 but remains in a  similar nonspecific configuration. There is bilateral temporal lobe involvement.  There has been superimposed the generalized cerebral volume loss.  There are small number of chronic lacunar infarcts in the left basal ganglia. Other deep gray matter nuclei are within normal limits.  The comparatively mild T2 and FLAIR hyperintensity in the pontine white matter.  Cerebellum and visualized cervical spine are within normal limits for age.  Chronic adenoid soft tissue hypertrophy is stable.  Partially empty sella configuration has increased.  Stable and normal bone marrow signal. Visualized orbit soft tissues are within normal limits. Mild paranasal sinus mucosal thickening similar to the prior study. Mastoids are clear.  Grossly stable and normal internal auditory structures.  Negative scalp soft tissues.  IMPRESSION: 1. No acute intracranial abnormality. 2.  Progression of nonspecific white matter signal changes and volume loss since 2005.  White matter disease is severe for age. Favor chronic small vessel disease with main differential consideration of chronic demyelination, multiple sclerosis. 3.  See MRA findings  below.  MRA NECK WITHOUT AND WITH CONTRAST  Technique:  Angiographic images of the neck were obtained using MRA technique without and with intravenous contrast.  Carotid stenosis measurements (when applicable) are obtained utilizing NASCET criteria, using the distal internal carotid diameter as the denominator.  Contrast:  . 15 ml MultiHance.  Findings:  Precontrast time-of-flight imaging reveals antegrade flow in both carotid and vertebral arteries.  Three-vessel arch configuration.  Mild to moderate irregularity of the great vessel origins compatible with atherosclerosis. Subsequent common carotid artery origin stenosis is less than 50 % with respect to the distal vessel.   Beyond its origin the right common carotid artery is normal to the bifurcation.  At the bifurcation there is circumferential  irregularity compatible with plaque and subsequent stenosis just proximal to the ECA and ICA origin.  There is more severe right ICA stenosis approximately 20 mm beyond its origin, or focal flow gap occurs (series 44 image 6).  This area is partially obscured by prominent ECA branches.  The right ICA remains patent and beyond this level is within normal limits to the skull base.  The left common carotid artery is only mildly irregular beyond its origin.  There is mild plaque and less than 50% narrowing of the left ICA origin.  There is incidental severe stenosis of the left ECA origin.  There is mild irregularity of the left ICA bulb, otherwise the cervical left ICA is within normal limits.  Codominant distal vertebral arteries.  Both vertebral artery origins are patent.  Flow signal in the left proximal vertebral V1 segment is asymmetrically decreased such that a moderate V1 stenosis is suspected.  The V2 segments are symmetric with mild irregularity.  The distal vertebral arteries in the neck are within normal limits.  There may be mild stenosis at the left vertebrobasilar junction.  IMPRESSION: 1.  High-grade right ICA stenosis 2 cm beyond its origin, with focal loss of flow signal.  The right ICA remains patent beyond this level. 2.  No significant left carotid stenosis in the neck (except for incidental left ECA stenosis). 3.  Moderate proximal left vertebral artery stenosis. 4.  Intracranial MRA findings are below.  MRA HEAD WITHOUT CONTRAST  Technique: Angiographic images of the Circle of Willis were obtained using MRA technique without  intravenous contrast.  Findings:  Antegrade flow in the posterior circulation with relatively codominant distal vertebral arteries.  Normal left PICA. Normal vertebrobasilar junction.  Normal AICA vessels.  Mild basilar artery irregularity without stenosis.  Superior cerebellar artery and PCA origins are within normal limits.  Posterior communicating arteries are diminutive  or absent.  Bilateral PCA branches are within normal limits.  Antegrade flow in both ICA siphons.  Ophthalmic artery origins are within normal limits.  Mild ICA irregularity appears stable.  No ICA stenosis.  Stable carotid termini.  MCA and left ACA origins are within normal limits.  Nondominant right ACA A1 segment is stable.  Anterior communicating artery and visualized ACA branches appear stable and within normal limits.  MCA M1 segment are within normal limits.  Visualized bilateral MCA branches are stable and within normal limits.  IMPRESSION: Stable intracranial MRA since 2005 with mild if any intracranial atherosclerosis.  Original Report Authenticated By: Harley Hallmark, M.D.   Mr Brain Wo Contrast  10/03/2011  *RADIOLOGY REPORT*  Clinical Data:  66 year old female with fatigue, dizziness, numbness.  Dizziness and weakness.  Comparison: Brain MRI and MRA 05/22/2003.  MRI HEAD WITHOUT CONTRAST  Technique: Multiplanar, multiecho  pulse sequences of the brain and surrounding structures were obtained according to standard protocol without intravenous contrast.  Findings:  No restricted diffusion to suggest acute infarction.  No midline shift, mass effect, evidence of mass lesion, ventriculomegaly, extra-axial collection or acute intracranial hemorrhage.  Cervicomedullary junction within normal limits.  Major intracranial vascular flow voids are stable.  Patchy confluent cerebral white matter T2 and FLAIR hyperintensity has progressed since 2005 but remains in a similar nonspecific configuration. There is bilateral temporal lobe involvement.  There has been superimposed the generalized cerebral volume loss.  There are small number of chronic lacunar infarcts in the left basal ganglia. Other deep gray matter nuclei are within normal limits.  The comparatively mild T2 and FLAIR hyperintensity in the pontine white matter.  Cerebellum and visualized cervical spine are within normal limits for age.  Chronic adenoid  soft tissue hypertrophy is stable.  Partially empty sella configuration has increased.  Stable and normal bone marrow signal. Visualized orbit soft tissues are within normal limits. Mild paranasal sinus mucosal thickening similar to the prior study. Mastoids are clear.  Grossly stable and normal internal auditory structures.  Negative scalp soft tissues.  IMPRESSION: 1. No acute intracranial abnormality. 2.  Progression of nonspecific white matter signal changes and volume loss since 2005.  White matter disease is severe for age. Favor chronic small vessel disease with main differential consideration of chronic demyelination, multiple sclerosis. 3.  See MRA findings below.  MRA NECK WITHOUT AND WITH CONTRAST  Technique:  Angiographic images of the neck were obtained using MRA technique without and with intravenous contrast.  Carotid stenosis measurements (when applicable) are obtained utilizing NASCET criteria, using the distal internal carotid diameter as the denominator.  Contrast:  . 15 ml MultiHance.  Findings:  Precontrast time-of-flight imaging reveals antegrade flow in both carotid and vertebral arteries.  Three-vessel arch configuration.  Mild to moderate irregularity of the great vessel origins compatible with atherosclerosis. Subsequent common carotid artery origin stenosis is less than 50 % with respect to the distal vessel.   Beyond its origin the right common carotid artery is normal to the bifurcation.  At the bifurcation there is circumferential irregularity compatible with plaque and subsequent stenosis just proximal to the ECA and ICA origin.  There is more severe right ICA stenosis approximately 20 mm beyond its origin, or focal flow gap occurs (series 44 image 6).  This area is partially obscured by prominent ECA branches.  The right ICA remains patent and beyond this level is within normal limits to the skull base.  The left common carotid artery is only mildly irregular beyond its origin.  There  is mild plaque and less than 50% narrowing of the left ICA origin.  There is incidental severe stenosis of the left ECA origin.  There is mild irregularity of the left ICA bulb, otherwise the cervical left ICA is within normal limits.  Codominant distal vertebral arteries.  Both vertebral artery origins are patent.  Flow signal in the left proximal vertebral V1 segment is asymmetrically decreased such that a moderate V1 stenosis is suspected.  The V2 segments are symmetric with mild irregularity.  The distal vertebral arteries in the neck are within normal limits.  There may be mild stenosis at the left vertebrobasilar junction.  IMPRESSION: 1.  High-grade right ICA stenosis 2 cm beyond its origin, with focal loss of flow signal.  The right ICA remains patent beyond this level. 2.  No significant left carotid stenosis in the neck (  except for incidental left ECA stenosis). 3.  Moderate proximal left vertebral artery stenosis. 4.  Intracranial MRA findings are below.  MRA HEAD WITHOUT CONTRAST  Technique: Angiographic images of the Circle of Willis were obtained using MRA technique without  intravenous contrast.  Findings:  Antegrade flow in the posterior circulation with relatively codominant distal vertebral arteries.  Normal left PICA. Normal vertebrobasilar junction.  Normal AICA vessels.  Mild basilar artery irregularity without stenosis.  Superior cerebellar artery and PCA origins are within normal limits.  Posterior communicating arteries are diminutive or absent.  Bilateral PCA branches are within normal limits.  Antegrade flow in both ICA siphons.  Ophthalmic artery origins are within normal limits.  Mild ICA irregularity appears stable.  No ICA stenosis.  Stable carotid termini.  MCA and left ACA origins are within normal limits.  Nondominant right ACA A1 segment is stable.  Anterior communicating artery and visualized ACA branches appear stable and within normal limits.  MCA M1 segment are within normal  limits.  Visualized bilateral MCA branches are stable and within normal limits.  IMPRESSION: Stable intracranial MRA since 2005 with mild if any intracranial atherosclerosis.  Original Report Authenticated By: Harley Hallmark, M.D.    Review of Systems  Constitutional: Negative for fever.  HENT: Negative for congestion.   Eyes: Negative for blurred vision.  Respiratory: Negative for cough and shortness of breath.   Cardiovascular: Negative for chest pain, palpitations, orthopnea, claudication and leg swelling.  Gastrointestinal: Positive for nausea, vomiting and constipation. Negative for abdominal pain, blood in stool and melena.  Genitourinary: Negative for dysuria and hematuria.  Musculoskeletal:       She describes left inguinal pain which does not necessarily get worse with ambulation.   Neurological: Positive for dizziness.    Blood pressure 204/94, pulse 78, temperature 97.9 F (36.6 C), temperature source Oral, resp. rate 20, height 5' 7.5" (1.715 m), weight 78.019 kg (172 lb), SpO2 97.00%. Physical Exam  Vitals reviewed. Constitutional: She is oriented to person, place, and time. She appears well-developed and well-nourished. No distress.       Obese  HENT:  Head: Normocephalic and atraumatic.  Eyes: Conjunctivae and EOM are normal. Pupils are equal, round, and reactive to light. Right eye exhibits no discharge. Left eye exhibits no discharge. No scleral icterus.  Neck: Normal range of motion. Neck supple. No JVD present. No tracheal deviation present. No thyromegaly present.  Cardiovascular: Normal rate and regular rhythm.  Exam reveals gallop (S4). Exam reveals no friction rub.   No murmur heard. Pulses:      Carotid pulses are on the right side with bruit, and on the left side with bruit.      Radial pulses are 2+ on the right side, and 2+ on the left side.       Dorsalis pedis pulses are 2+ on the right side, and 1+ on the left side.       Posterior tibial pulses are 1+ on  the right side, and 1+ on the left side.       Soft bilateral carotid bruits. Left pedal pulses are diminished < Right.   Slightly cooler left foot compared to right. + S4  Respiratory: Effort normal and breath sounds normal. No respiratory distress. She has no wheezes. She has no rales. She exhibits no tenderness.  GI: Soft. Bowel sounds are normal. She exhibits no distension and no mass. There is no tenderness. There is no rebound and no guarding.  Genitourinary:  Deferred  Musculoskeletal: She exhibits no edema and no tenderness.       No LEE   Lymphadenopathy:    She has no cervical adenopathy.  Neurological: She is alert and oriented to person, place, and time. She displays normal reflexes. No cranial nerve deficit. She exhibits normal muscle tone. Coordination normal.  Skin: Skin is warm and dry.  Psychiatric: She has a normal mood and affect. Her behavior is normal. Judgment and thought content normal.     Assessment/Plan Patient Active Hospital Problem List: Hypertension, malignant (10/03/2011) Dizziness (10/03/2011) Right ICA stenosis PAD (peripheral artery disease): mid right SFA (10/03/2011) HYPERLIPIDEMIA (11/21/2007) Tobacco abuse (10/03/2011) LVH (left ventricular hypertrophy) (10/03/2011) Renal artery stenosis: left 50% 2005 (10/03/2011)  Plan:  The patient was admitted to CICU on IV labetalol.  I will set parameters to maintain SBP between 130-140.  I will order 2D Echo, Carotid dopplers to asses right ICA flow.  Start statin, ASA.  She likely needs renal artery assessment either with dopplers or angiogram.  Given history of PAD, continued tobacco abuse and decreased left pedal pulses,  angiogram may be a good choice.   Wilburt Finlay 10/03/2011, 6:05 PM  ATTENDING ATTESTATION:  I have seen and examined the patient along with Wilburt Finlay, PA.  I have reviewed the chart, notes and new data.  I agree with Bryan's note, exam findings & recommendations.  Brief Description:  67 y/o woman with h/p HTN & PAD, chronic/persistent smoker who presented with SSx c/w Hypertensive Crisis / Malignant HTN -- dizziness, nausea, HA & blurred vision.  In [facility] found to have BPs ~230s/140s & ECG with diffuse ~2.5 mmST Depressions with TWI in Inf-Lat leads associated with septal Q waves & ST Elevations -- suggestive of LVH with strain.  Not felt to meed STEMI requirements by Dr. Excell Seltzer - & Troponin's have remained negative.  The ST depressions & Elevations improved dramatically with BP lowering post IV Labetalol.  She has not c/o CP or SOB, but has been weak with nausea -- ECG changes as likely due to LVH, but cannot r/o ischemia with Q waves & known PAD history. MRI/A of Head & neck demonstrates severe RICA stenosis. She was essentially lost to f/u with Dr. Allyson Sabal after LE PTCA/Stent ~5 yrs ago. Thankfully her renal function remains normal.  I agree with his exam.  PLAN:  Continue IV Labetalol overnight for target BP ~130-160 mmHg -- wean off in AM with plan to convert to PO BB & ACE-i +/- thiazide diuretic.  CXR preliminary review suggests possible interstitial edema -- will give a dose of IV lasix  Continue to cycle cardiac biomarkers & add proBNP to initial set.  The goal will be adequate BP control, but given the MRI/A findings, will as Dr. Allyson Sabal to see her in AM to consider possible Carotid Angiography along with Coronary Angiography to confirm no CAD.  Agree with Statin & ASA  Smoking cessation -- I spent ~ 5 minutes discussing the detrimental effects of smoking -- including financial and health related.  She is in the contemplative stage.  Marykay Lex, M.D., M.S. [facility] [address] [place], [place]  [address]  [phone]  10/03/2011 6:42 PM

## 2011-10-03 NOTE — ED Provider Notes (Signed)
History  This chart was scribed for Leslie Octave, MD by Ladona Ridgel Day. This patient was seen in room APA16A/AP[facility]16A and the patient's care was started at 1138.  CSN: [id number]  Arrival date & time 10/03/11  1110   First MD Initiated Contact with Patient 10/03/11 1138      Chief Complaint  Patient presents with  . Fatigue  . Dizziness  . Numbness    The history is provided by the patient and a relative. No language interpreter was used.   Leslie [name] is a 66 y.o. female who presents to the Emergency Department complaining of gradually worsening dizziness which began 3 days ago. She states she was at work taking out the trash and felt a sudden onset of diffuse weakness through her body and has had intermittent dizziness since then. She states nausea, mild chronic back pain, generalized weakness, emesis on arrival as her associated symptoms. She states modifying factors as standing up makes her feel more dizzy and walking makes her chronic injury painful. She denies any chest pain, SOB, abdominal pain, and blurry/double vision at this time. She has had previous back surgery.  She was seen by Dr. Felecia Shelling years ago but is no longer with him.   Past Medical History  Diagnosis Date  . PAD (peripheral artery disease)   . HTN (hypertension), benign   . Tobacco abuse     Past Surgical History  Procedure Date  . Back surgery   . Leg stents     History reviewed. No pertinent family history.  History  Substance Use Topics  . Smoking status: Current Everyday Smoker -- 0.5 packs/day for 50 years    Types: Cigarettes  . Smokeless tobacco: Not on file  . Alcohol Use: Yes    OB History    Grav Para Term Preterm Abortions TAB SAB Ect Mult Living                  Review of Systems  Constitutional: Negative for fever.  HENT: Negative for congestion, sore throat and neck pain.   Respiratory: Negative for cough, chest tightness and shortness of breath.   Cardiovascular: Negative for  chest pain.  Gastrointestinal: Positive for nausea and vomiting (Emesis episode just prior to arrival. ). Negative for abdominal pain and diarrhea.  Musculoskeletal: Positive for back pain (Chronic back pain. ).  Skin: Negative for color change and pallor.  Neurological: Positive for weakness (Generalized weakness. ) and light-headedness. Negative for speech difficulty and numbness.  All other systems reviewed and are negative.    Allergies  Latex and Tape  Home Medications  No current outpatient prescriptions on file.  Triage Vitals: BP 209/92  Pulse 74  Temp 97.9 F (36.6 C) (Oral)  Resp 17  Ht 5' 7.5" (1.715 m)  Wt 172 lb (78.019 kg)  BMI 26.54 kg/m2  SpO2 100%  Physical Exam  Nursing note and vitals reviewed. Constitutional: She is oriented to person, place, and time. She appears well-developed and well-nourished. No distress.  HENT:  Head: Normocephalic and atraumatic.  Eyes: Conjunctivae and EOM are normal.  Neck: Neck supple. No tracheal deviation present.  Cardiovascular: Normal rate, regular rhythm, normal heart sounds and intact distal pulses.   Pulmonary/Chest: Effort normal. No respiratory distress.  Abdominal: Soft. She exhibits no distension. There is no tenderness. There is no rebound and no guarding.  Musculoskeletal: Normal range of motion.       No midline or paraspinal tenderness.   Neurological: She is alert  and oriented to person, place, and time. No cranial nerve deficit. Coordination normal.       5/5 Motor strength throughout all extremities.  Cranial nerves 3-12 intact.  No nystagmus, ataxia.  Finger to nose test is normal. +2 Patellar reflex.   Skin: Skin is warm and dry.  Psychiatric: She has a normal mood and affect. Her behavior is normal.    ED Course  Procedures (including critical care time) DIAGNOSTIC STUDIES: Oxygen Saturation is 100% on room air, normal by my interpretation.    COORDINATION OF CARE: At 1149 AM Discussed treatment  plan with patient which includes head MRI, heart makers, urine analysis, and blood work. Patient agrees.   1237 PM I discussed consult with cardiologist Dr. Excell Seltzer with patient discussing that she does not immediately need a heart cath but should look to schedule one soon due to abnormal EKG/lab results.  Labs Reviewed  COMPREHENSIVE METABOLIC PANEL - Abnormal; Notable for the following:    Glucose, Bld 121 (*)     Total Protein 8.4 (*)     GFR calc non Af Amer 90 (*)     All other components within normal limits  URINALYSIS, ROUTINE W REFLEX MICROSCOPIC - Abnormal; Notable for the following:    Hgb urine dipstick SMALL (*)     All other components within normal limits  URINE MICROSCOPIC-ADD ON - Abnormal; Notable for the following:    Squamous Epithelial / LPF FEW (*)     Bacteria, UA FEW (*)     All other components within normal limits  D-DIMER, QUANTITATIVE - Abnormal; Notable for the following:    D-Dimer, Quant 0.49 (*)     All other components within normal limits  PRO B NATRIURETIC PEPTIDE - Abnormal; Notable for the following:    Pro B Natriuretic peptide (BNP) 758.3 (*)     All other components within normal limits  CBC WITH DIFFERENTIAL  CARDIAC PANEL(CRET KIN+CKTOT+MB+TROPI)  POCT I-STAT TROPONIN I  MRSA PCR SCREENING  CBC  CARDIAC PANEL(CRET KIN+CKTOT+MB+TROPI)  CARDIAC PANEL(CRET KIN+CKTOT+MB+TROPI)  BASIC METABOLIC PANEL  CARDIAC PANEL(CRET KIN+CKTOT+MB+TROPI)  HEMOGLOBIN A1C   Mr [name] Wo Contrast  10/03/2011  *RADIOLOGY REPORT*  Clinical Data:  66 year old female with fatigue, dizziness, numbness.  Dizziness and weakness.  Comparison: Brain MRI and MRA 05/22/2003.  MRI HEAD WITHOUT CONTRAST  Technique: Multiplanar, multiecho pulse sequences of the brain and surrounding structures were obtained according to standard protocol without intravenous contrast.  Findings:  No restricted diffusion to suggest acute infarction.  No midline shift, mass effect, evidence of  mass lesion, ventriculomegaly, extra-axial collection or acute intracranial hemorrhage.  Cervicomedullary junction within normal limits.  Major intracranial vascular flow voids are stable.  Patchy confluent cerebral white matter T2 and FLAIR hyperintensity has progressed since 2005 but remains in a similar nonspecific configuration. There is bilateral temporal lobe involvement.  There has been superimposed the generalized cerebral volume loss.  There are small number of chronic lacunar infarcts in the left basal ganglia. Other deep gray matter nuclei are within normal limits.  The comparatively mild T2 and FLAIR hyperintensity in the pontine white matter.  Cerebellum and visualized cervical spine are within normal limits for age.  Chronic adenoid soft tissue hypertrophy is stable.  Partially empty sella configuration has increased.  Stable and normal bone marrow signal. Visualized orbit soft tissues are within normal limits. Mild paranasal sinus mucosal thickening similar to the prior study. Mastoids are clear.  Grossly stable and normal internal auditory structures.  Negative scalp soft tissues.  IMPRESSION: 1. No acute intracranial abnormality. 2.  Progression of nonspecific white matter signal changes and volume loss since 2005.  White matter disease is severe for age. Favor chronic small vessel disease with main differential consideration of chronic demyelination, multiple sclerosis. 3.  See MRA findings below.  MRA NECK WITHOUT AND WITH CONTRAST  Technique:  Angiographic images of the neck were obtained using MRA technique without and with intravenous contrast.  Carotid stenosis measurements (when applicable) are obtained utilizing NASCET criteria, using the distal internal carotid diameter as the denominator.  Contrast:  . 15 ml MultiHance.  Findings:  Precontrast time-of-flight imaging reveals antegrade flow in both carotid and vertebral arteries.  Three-vessel arch configuration.  Mild to moderate  irregularity of the great vessel origins compatible with atherosclerosis. Subsequent common carotid artery origin stenosis is less than 50 % with respect to the distal vessel.   Beyond its origin the right common carotid artery is normal to the bifurcation.  At the bifurcation there is circumferential irregularity compatible with plaque and subsequent stenosis just proximal to the ECA and ICA origin.  There is more severe right ICA stenosis approximately 20 mm beyond its origin, or focal flow gap occurs (series 44 image 6).  This area is partially obscured by prominent ECA branches.  The right ICA remains patent and beyond this level is within normal limits to the skull base.  The left common carotid artery is only mildly irregular beyond its origin.  There is mild plaque and less than 50% narrowing of the left ICA origin.  There is incidental severe stenosis of the left ECA origin.  There is mild irregularity of the left ICA bulb, otherwise the cervical left ICA is within normal limits.  Codominant distal vertebral arteries.  Both vertebral artery origins are patent.  Flow signal in the left proximal vertebral V1 segment is asymmetrically decreased such that a moderate V1 stenosis is suspected.  The V2 segments are symmetric with mild irregularity.  The distal vertebral arteries in the neck are within normal limits.  There may be mild stenosis at the left vertebrobasilar junction.  IMPRESSION: 1.  High-grade right ICA stenosis 2 cm beyond its origin, with focal loss of flow signal.  The right ICA remains patent beyond this level. 2.  No significant left carotid stenosis in the neck (except for incidental left ECA stenosis). 3.  Moderate proximal left vertebral artery stenosis. 4.  Intracranial MRA findings are below.  MRA HEAD WITHOUT CONTRAST  Technique: Angiographic images of the Circle of Willis were obtained using MRA technique without  intravenous contrast.  Findings:  Antegrade flow in the posterior  circulation with relatively codominant distal vertebral arteries.  Normal left PICA. Normal vertebrobasilar junction.  Normal AICA vessels.  Mild basilar artery irregularity without stenosis.  Superior cerebellar artery and PCA origins are within normal limits.  Posterior communicating arteries are diminutive or absent.  Bilateral PCA branches are within normal limits.  Antegrade flow in both ICA siphons.  Ophthalmic artery origins are within normal limits.  Mild ICA irregularity appears stable.  No ICA stenosis.  Stable carotid termini.  MCA and left ACA origins are within normal limits.  Nondominant right ACA A1 segment is stable.  Anterior communicating artery and visualized ACA branches appear stable and within normal limits.  MCA M1 segment are within normal limits.  Visualized bilateral MCA branches are stable and within normal limits.  IMPRESSION: Stable intracranial MRA since 2005 with mild if any  intracranial atherosclerosis.  Original Report Authenticated By: Harley Hallmark, M.D.   Mr Angiogram Neck W Wo Contrast  10/03/2011  *RADIOLOGY REPORT*  Clinical Data:  66 year old female with fatigue, dizziness, numbness.  Dizziness and weakness.  Comparison: Brain MRI and MRA 05/22/2003.  MRI HEAD WITHOUT CONTRAST  Technique: Multiplanar, multiecho pulse sequences of the brain and surrounding structures were obtained according to standard protocol without intravenous contrast.  Findings:  No restricted diffusion to suggest acute infarction.  No midline shift, mass effect, evidence of mass lesion, ventriculomegaly, extra-axial collection or acute intracranial hemorrhage.  Cervicomedullary junction within normal limits.  Major intracranial vascular flow voids are stable.  Patchy confluent cerebral white matter T2 and FLAIR hyperintensity has progressed since 2005 but remains in a similar nonspecific configuration. There is bilateral temporal lobe involvement.  There has been superimposed the generalized cerebral  volume loss.  There are small number of chronic lacunar infarcts in the left basal ganglia. Other deep gray matter nuclei are within normal limits.  The comparatively mild T2 and FLAIR hyperintensity in the pontine white matter.  Cerebellum and visualized cervical spine are within normal limits for age.  Chronic adenoid soft tissue hypertrophy is stable.  Partially empty sella configuration has increased.  Stable and normal bone marrow signal. Visualized orbit soft tissues are within normal limits. Mild paranasal sinus mucosal thickening similar to the prior study. Mastoids are clear.  Grossly stable and normal internal auditory structures.  Negative scalp soft tissues.  IMPRESSION: 1. No acute intracranial abnormality. 2.  Progression of nonspecific white matter signal changes and volume loss since 2005.  White matter disease is severe for age. Favor chronic small vessel disease with main differential consideration of chronic demyelination, multiple sclerosis. 3.  See MRA findings below.  MRA NECK WITHOUT AND WITH CONTRAST  Technique:  Angiographic images of the neck were obtained using MRA technique without and with intravenous contrast.  Carotid stenosis measurements (when applicable) are obtained utilizing NASCET criteria, using the distal internal carotid diameter as the denominator.  Contrast:  . 15 ml MultiHance.  Findings:  Precontrast time-of-flight imaging reveals antegrade flow in both carotid and vertebral arteries.  Three-vessel arch configuration.  Mild to moderate irregularity of the great vessel origins compatible with atherosclerosis. Subsequent common carotid artery origin stenosis is less than 50 % with respect to the distal vessel.   Beyond its origin the right common carotid artery is normal to the bifurcation.  At the bifurcation there is circumferential irregularity compatible with plaque and subsequent stenosis just proximal to the ECA and ICA origin.  There is more severe right ICA stenosis  approximately 20 mm beyond its origin, or focal flow gap occurs (series 44 image 6).  This area is partially obscured by prominent ECA branches.  The right ICA remains patent and beyond this level is within normal limits to the skull base.  The left common carotid artery is only mildly irregular beyond its origin.  There is mild plaque and less than 50% narrowing of the left ICA origin.  There is incidental severe stenosis of the left ECA origin.  There is mild irregularity of the left ICA bulb, otherwise the cervical left ICA is within normal limits.  Codominant distal vertebral arteries.  Both vertebral artery origins are patent.  Flow signal in the left proximal vertebral V1 segment is asymmetrically decreased such that a moderate V1 stenosis is suspected.  The V2 segments are symmetric with mild irregularity.  The distal vertebral arteries in the  neck are within normal limits.  There may be mild stenosis at the left vertebrobasilar junction.  IMPRESSION: 1.  High-grade right ICA stenosis 2 cm beyond its origin, with focal loss of flow signal.  The right ICA remains patent beyond this level. 2.  No significant left carotid stenosis in the neck (except for incidental left ECA stenosis). 3.  Moderate proximal left vertebral artery stenosis. 4.  Intracranial MRA findings are below.  MRA HEAD WITHOUT CONTRAST  Technique: Angiographic images of the Circle of Willis were obtained using MRA technique without  intravenous contrast.  Findings:  Antegrade flow in the posterior circulation with relatively codominant distal vertebral arteries.  Normal left PICA. Normal vertebrobasilar junction.  Normal AICA vessels.  Mild basilar artery irregularity without stenosis.  Superior cerebellar artery and PCA origins are within normal limits.  Posterior communicating arteries are diminutive or absent.  Bilateral PCA branches are within normal limits.  Antegrade flow in both ICA siphons.  Ophthalmic artery origins are within normal  limits.  Mild ICA irregularity appears stable.  No ICA stenosis.  Stable carotid termini.  MCA and left ACA origins are within normal limits.  Nondominant right ACA A1 segment is stable.  Anterior communicating artery and visualized ACA branches appear stable and within normal limits.  MCA M1 segment are within normal limits.  Visualized bilateral MCA branches are stable and within normal limits.  IMPRESSION: Stable intracranial MRA since 2005 with mild if any intracranial atherosclerosis.  Original Report Authenticated By: Harley Hallmark, M.D.   Mr Brain Wo Contrast  10/03/2011  *RADIOLOGY REPORT*  Clinical Data:  66 year old female with fatigue, dizziness, numbness.  Dizziness and weakness.  Comparison: Brain MRI and MRA 05/22/2003.  MRI HEAD WITHOUT CONTRAST  Technique: Multiplanar, multiecho pulse sequences of the brain and surrounding structures were obtained according to standard protocol without intravenous contrast.  Findings:  No restricted diffusion to suggest acute infarction.  No midline shift, mass effect, evidence of mass lesion, ventriculomegaly, extra-axial collection or acute intracranial hemorrhage.  Cervicomedullary junction within normal limits.  Major intracranial vascular flow voids are stable.  Patchy confluent cerebral white matter T2 and FLAIR hyperintensity has progressed since 2005 but remains in a similar nonspecific configuration. There is bilateral temporal lobe involvement.  There has been superimposed the generalized cerebral volume loss.  There are small number of chronic lacunar infarcts in the left basal ganglia. Other deep gray matter nuclei are within normal limits.  The comparatively mild T2 and FLAIR hyperintensity in the pontine white matter.  Cerebellum and visualized cervical spine are within normal limits for age.  Chronic adenoid soft tissue hypertrophy is stable.  Partially empty sella configuration has increased.  Stable and normal bone marrow signal. Visualized orbit  soft tissues are within normal limits. Mild paranasal sinus mucosal thickening similar to the prior study. Mastoids are clear.  Grossly stable and normal internal auditory structures.  Negative scalp soft tissues.  IMPRESSION: 1. No acute intracranial abnormality. 2.  Progression of nonspecific white matter signal changes and volume loss since 2005.  White matter disease is severe for age. Favor chronic small vessel disease with main differential consideration of chronic demyelination, multiple sclerosis. 3.  See MRA findings below.  MRA NECK WITHOUT AND WITH CONTRAST  Technique:  Angiographic images of the neck were obtained using MRA technique without and with intravenous contrast.  Carotid stenosis measurements (when applicable) are obtained utilizing NASCET criteria, using the distal internal carotid diameter as the denominator.  Contrast:  .  15 ml MultiHance.  Findings:  Precontrast time-of-flight imaging reveals antegrade flow in both carotid and vertebral arteries.  Three-vessel arch configuration.  Mild to moderate irregularity of the great vessel origins compatible with atherosclerosis. Subsequent common carotid artery origin stenosis is less than 50 % with respect to the distal vessel.   Beyond its origin the right common carotid artery is normal to the bifurcation.  At the bifurcation there is circumferential irregularity compatible with plaque and subsequent stenosis just proximal to the ECA and ICA origin.  There is more severe right ICA stenosis approximately 20 mm beyond its origin, or focal flow gap occurs (series 44 image 6).  This area is partially obscured by prominent ECA branches.  The right ICA remains patent and beyond this level is within normal limits to the skull base.  The left common carotid artery is only mildly irregular beyond its origin.  There is mild plaque and less than 50% narrowing of the left ICA origin.  There is incidental severe stenosis of the left ECA origin.  There is mild  irregularity of the left ICA bulb, otherwise the cervical left ICA is within normal limits.  Codominant distal vertebral arteries.  Both vertebral artery origins are patent.  Flow signal in the left proximal vertebral V1 segment is asymmetrically decreased such that a moderate V1 stenosis is suspected.  The V2 segments are symmetric with mild irregularity.  The distal vertebral arteries in the neck are within normal limits.  There may be mild stenosis at the left vertebrobasilar junction.  IMPRESSION: 1.  High-grade right ICA stenosis 2 cm beyond its origin, with focal loss of flow signal.  The right ICA remains patent beyond this level. 2.  No significant left carotid stenosis in the neck (except for incidental left ECA stenosis). 3.  Moderate proximal left vertebral artery stenosis. 4.  Intracranial MRA findings are below.  MRA HEAD WITHOUT CONTRAST  Technique: Angiographic images of the Circle of Willis were obtained using MRA technique without  intravenous contrast.  Findings:  Antegrade flow in the posterior circulation with relatively codominant distal vertebral arteries.  Normal left PICA. Normal vertebrobasilar junction.  Normal AICA vessels.  Mild basilar artery irregularity without stenosis.  Superior cerebellar artery and PCA origins are within normal limits.  Posterior communicating arteries are diminutive or absent.  Bilateral PCA branches are within normal limits.  Antegrade flow in both ICA siphons.  Ophthalmic artery origins are within normal limits.  Mild ICA irregularity appears stable.  No ICA stenosis.  Stable carotid termini.  MCA and left ACA origins are within normal limits.  Nondominant right ACA A1 segment is stable.  Anterior communicating artery and visualized ACA branches appear stable and within normal limits.  MCA M1 segment are within normal limits.  Visualized bilateral MCA branches are stable and within normal limits.  IMPRESSION: Stable intracranial MRA since 2005 with mild if any  intracranial atherosclerosis.  Original Report Authenticated By: Harley Hallmark, M.D.   Dg Chest Port 1 View  10/03/2011  *RADIOLOGY REPORT*  Clinical Data: Decreased breath sounds.  PORTABLE CHEST - 1 VIEW  Comparison: Chest x-ray 04/25/2007.  Findings: Lung volumes are normal.  No consolidative airspace disease.  No pleural effusions.  No pneumothorax.  No pulmonary nodule or mass noted. Pulmonary vasculature is normal.  Heart size is mildly enlarged with prominence of the left ventricular contour. Atherosclerotic calcifications within the thoracic aorta.  IMPRESSION: 1. No radiographic evidence of acute cardiopulmonary disease. 2.  Mild cardiomegaly  with prominent left ventricular contour which may suggest underlying left ventricular hypertrophy. 3.  Atherosclerosis.  Original Report Authenticated By: Florencia Reasons, M.D.     1. EKG abnormality   2. Dizziness   3. Hypertensive emergency       MDM  3 days of lightheadedness, dizziness, vertigo that is intermittent. Chronic left hip/groin and upper leg pain that does not radiate. No focal weakness, numbness, tingling, chest pain or shortness of breath. Uncontrolled blood pressure.  EKG changes discussed with Dr. Excell Seltzer on-call interventional cardiologist. He agrees changes are concerning but does not meet criteria for ST elevation MI. Patient continues to deny chest pain shortness of breath.  D/w Dr. Jomarie Longs of [facility].  He accepts patient in transfer to CICU at [facility].  Plans [facility].  Does not recommend heparin or NTG gtt unless patient has chest pain or positive cardiac markers. Continue labetalol gtt for BP control  MRI negative for CVA, but high grade R ICA stenosis identified. Admitting team aware.    Date: 10/03/2011  Rate: 85  Rhythm: normal sinus rhythm  QRS Axis: normal  Intervals: normal  ST/T Wave abnormalities: ST elevations anteriorly, ST depressions inferiorly, ST depressions laterally and ST depressions diffusely   Conduction Disutrbances:none  Narrative Interpretation: ST elevation with Q waves V1, V2, aVR. ST depression with T wave inversions, 2, 3, aVF, V5 and V6  Old EKG Reviewed: changes noted  CRITICAL CARE Performed by: Leslie [name]   Total critical care time: 40  Critical care time was exclusive of separately billable procedures and treating other patients.  Critical care was necessary to treat or prevent imminent or life-threatening deterioration.  Critical care was time spent personally by me on the following activities: development of treatment plan with patient and/or surrogate as well as nursing, discussions with consultants, evaluation of patient's response to treatment, examination of patient, obtaining history from patient or surrogate, ordering and performing treatments and interventions, ordering and review of laboratory studies, ordering and review of radiographic studies, pulse oximetry and re-evaluation of patient's condition.   I personally performed the services described in this documentation, which was scribed in my presence.  The recorded information has been reviewed and considered.   Leslie Octave, MD 10/04/11 [phone]

## 2011-10-03 NOTE — ED Notes (Signed)
Pt c/o chronic left leg numbess with new onset of dizziness and weakness.

## 2011-10-03 NOTE — ED Notes (Signed)
DP pulses dopplered billaterally and wnl.

## 2011-10-03 NOTE — ED Notes (Signed)
Pt in radiology 

## 2011-10-04 ENCOUNTER — Encounter (HOSPITAL_COMMUNITY): Payer: Self-pay | Admitting: Cardiology

## 2011-10-04 DIAGNOSIS — Z9114 Patient's other noncompliance with medication regimen: Secondary | ICD-10-CM

## 2011-10-04 DIAGNOSIS — M069 Rheumatoid arthritis, unspecified: Secondary | ICD-10-CM | POA: Diagnosis present

## 2011-10-04 DIAGNOSIS — Z91148 Patient's other noncompliance with medication regimen for other reason: Secondary | ICD-10-CM

## 2011-10-04 LAB — CARDIAC PANEL(CRET KIN+CKTOT+MB+TROPI)
Relative Index: INVALID (ref 0.0–2.5)
Relative Index: INVALID (ref 0.0–2.5)
Total CK: 75 U/L (ref 7–177)
Troponin I: <0.3 ng/mL (ref <0.30)
Troponin I: <0.3 ng/mL (ref <0.30)

## 2011-10-04 LAB — CBC
MCH: 28.2 pg (ref 26.0–34.0)
MCHC: 33 g/dL (ref 30.0–36.0)
MCV: 85.4 fL (ref 78.0–100.0)
Platelets: 263 10*3/uL (ref 150–400)
RBC: 4.44 MIL/uL (ref 3.87–5.11)
RDW: 15.2 % (ref 11.5–15.5)

## 2011-10-04 LAB — LIPID PANEL
Cholesterol: 266 mg/dL — ABNORMAL HIGH (ref 0–200)
HDL: 31 mg/dL — ABNORMAL LOW (ref >39)
LDL Cholesterol: 200 mg/dL — ABNORMAL HIGH (ref 0–99)
Total CHOL/HDL Ratio: 8.6 RATIO
Triglycerides: 177 mg/dL — ABNORMAL HIGH (ref <150)
VLDL: 35 mg/dL (ref 0–40)

## 2011-10-04 LAB — BASIC METABOLIC PANEL
BUN: 12 mg/dL (ref 6–23)
CO2: 25 mEq/L (ref 19–32)
Calcium: 9.6 mg/dL (ref 8.4–10.5)
Creatinine, Ser: 0.7 mg/dL (ref 0.50–1.10)
GFR calc non Af Amer: 90 mL/min — ABNORMAL LOW (ref >90)
Glucose, Bld: 114 mg/dL — ABNORMAL HIGH (ref 70–99)
Sodium: 138 mEq/L (ref 135–145)

## 2011-10-04 LAB — GLUCOSE, CAPILLARY: Glucose-Capillary: 95 mg/dL (ref 70–99)

## 2011-10-04 MED ORDER — LISINOPRIL 10 MG PO TABS
10.0000 mg | ORAL_TABLET | Freq: Once | ORAL | Status: AC
  Administered 2011-10-04: 10 mg via ORAL
  Filled 2011-10-04: qty 1

## 2011-10-04 MED ORDER — METOPROLOL TARTRATE 12.5 MG HALF TABLET: 12.5000 mg | ORAL_TABLET | Freq: Two times a day (BID) | ORAL | Status: DC

## 2011-10-04 MED ORDER — ROSUVASTATIN CALCIUM 5 MG PO TABS: 5.0000 mg | ORAL_TABLET | Freq: Every day | ORAL | Status: DC

## 2011-10-04 MED ORDER — METOPROLOL TARTRATE 12.5 MG HALF TABLET
12.5000 mg | ORAL_TABLET | Freq: Two times a day (BID) | ORAL | Status: DC
  Administered 2011-10-04: 12.5 mg via ORAL
  Filled 2011-10-04 (×2): qty 1

## 2011-10-04 MED ORDER — LISINOPRIL 10 MG PO TABS
10.0000 mg | ORAL_TABLET | Freq: Every day | ORAL | Status: DC
  Administered 2011-10-04: 10 mg via ORAL
  Filled 2011-10-04: qty 1

## 2011-10-04 MED ORDER — ASPIRIN 325 MG PO TBEC: 325.0000 mg | DELAYED_RELEASE_TABLET | Freq: Every day | ORAL | Status: AC

## 2011-10-04 MED ORDER — LISINOPRIL 20 MG PO TABS: 20.0000 mg | ORAL_TABLET | Freq: Every day | ORAL | Status: DC

## 2011-10-04 NOTE — Progress Notes (Signed)
Pt ambulated in hall with no dizziness. Went around circle and back to room. Duwaine Maxin, RN

## 2011-10-04 NOTE — Care Management Note (Signed)
    Page 1 of 1   10/04/2011     9:58:34 AM   CARE MANAGEMENT NOTE 10/04/2011  Patient:  DUSTYN, ARMBRISTER   Account Number:  [id number]  Date Initiated:  10/04/2011  Documentation initiated by:  Junius Creamer  Subjective/Objective Assessment:   adm w htn     Action/Plan:   lives alone, no ins listed   Anticipated DC Date:     Anticipated DC Plan:        DC Planning Services  CM consult      Choice offered to / List presented to:             Status of service:   Medicare Important Message given?   (If response is "NO", the following Medicare IM given date fields will be blank) Date Medicare IM given:   Date Additional Medicare IM given:    Discharge Disposition:    Per UR Regulation:  Reviewed for med. necessity/level of care/duration of stay  If discussed at Long Length of Stay Meetings, dates discussed:    Comments:  7/16 9:53a debbie Diasia Henken rn,bsn [phone] spoke w pt and gave her inform on [facility]. pt gets medicare in sept. she was on 4.00 meds from [facility] but refills ran out and she has not had money for office visits. she would like to follow up w [facility] [place]. she gets her medicare in sept 2013.

## 2011-10-04 NOTE — Progress Notes (Signed)
Patient ID: BREIA OCAMPO,  MRN: [id number], DOB/AGE: 65/19/1947 65 y.o.  Admit date: 10/03/2011 Discharge date: 10/04/2011  Primary Care Provider:  Primary Cardiologist: Dr Allyson Sabal  Discharge Diagnoses Principal Problem:  *Dizziness Active Problems:  Hypertension, malignant  PAD, high grade RICA by MRI 10/03/11  Renal artery stenosis: left 50% 2005  Non compliance w medication regimen secondary to cost  HYPERLIPIDEMIA  Tobacco abuse  LVH (left ventricular hypertrophy)  Arthritis, rheumatoid    Hospital Course:  66 y/o woman with h/p HTN & PAD.66 y/o woman with h/p HTN & PAD. She was essentially lost to f/u with Dr. Allyson Sabal after LE PTCA/Stent ~5 yrs ago. She is a  chronic/persistent smoker. She presented to [facility] with SSx c/w Hypertensive Crisis / Malignant HTN -- dizziness, nausea, HA & blurred vision. In [facility] ER found to have BPs ~230s/140s & ECG with diffuse ~2.5 mmST Depressions with TWI in Inf-Lat leads associated with septal Q waves & ST Elevations -- suggestive of LVH with strain. She was not felt to meed STEMI requirements by Dr. Excell Seltzer - & Troponin's have remained negative. The ST depressions & Elevations improved dramatically with BP lowering post IV Labetalol. She has not c/o CP or SOB, but has been weak with nausea -- ECG changes as likely due to LVH, but cannot r/o ischemia with Q waves & known PAD history. She had an MRI/A of Head & neck that demonstrated severe RICA stenosis at [facility]. She was admitted to stepdown and placed on IV Labetalol which controlled her B/P and actually was able to be turned off during the night. On the morning of 10/04/11 she is comfortable. We started beta blocker and Ace. Echo shows hyperdynamic LVF and LVH. Dr Rennis Golden suggest no diuretics for HTN in her situaction. We increased her Lisinopril to 20mg  daily at discharge. She notes a history of Lipitor intol so we added low dose Crestor at discharge. She will follow up with an extender in one week. She will need carotid  and renal dopplers in our office and then an apt to see Dr Allyson Sabal. At discharge she also noted a history of Lt LE pain c/w claudication. She will need LEA dopplers as well.    Discharge Vitals:  Blood pressure 136/82, pulse 69, temperature 98 F (36.7 C), temperature source Oral, resp. rate 20, height 5\' 7"  (1.702 m), weight 78 kg (171 lb 15.3 oz), SpO2 95.00%.    Labs: Results for orders placed during the hospital encounter of 10/03/11 (from the past 48 hour(s))  CBC WITH DIFFERENTIAL     Status: Normal   Collection Time   10/03/11 11:58 AM      Component Value Range Comment   WBC 7.9  4.0 - 10.5 K/uL    RBC 4.93  3.87 - 5.11 MIL/uL    Hemoglobin 14.1  12.0 - 15.0 g/dL    HCT 95.2  84.1 - 32.4 %    MCV 86.2  78.0 - 100.0 fL    MCH 28.6  26.0 - 34.0 pg    MCHC 33.2  30.0 - 36.0 g/dL    RDW 40.1  02.7 - 25.3 %    Platelets 282  150 - 400 K/uL    Neutrophils Relative 69  43 - 77 %    Neutro Abs 5.5  1.7 - 7.7 K/uL    Lymphocytes Relative 24  12 - 46 %    Lymphs Abs 1.9  0.7 - 4.0 K/uL    Monocytes Relative 6  3 - 12 %    Monocytes Absolute 0.5  0.1 - 1.0 K/uL    Eosinophils Relative 0  0 - 5 %    Eosinophils Absolute 0.0  0.0 - 0.7 K/uL    Basophils Relative 0  0 - 1 %    Basophils Absolute 0.0  0.0 - 0.1 K/uL   URINALYSIS, ROUTINE W REFLEX MICROSCOPIC     Status: Abnormal   Collection Time   10/03/11 12:32 PM      Component Value Range Comment   Color, Urine YELLOW  YELLOW    APPearance CLEAR  CLEAR    Specific Gravity, Urine 1.010  1.005 - 1.030    pH 6.5  5.0 - 8.0    Glucose, UA NEGATIVE  NEGATIVE mg/dL    Hgb urine dipstick SMALL (*) NEGATIVE    Bilirubin Urine NEGATIVE  NEGATIVE    Ketones, ur NEGATIVE  NEGATIVE mg/dL    Protein, ur NEGATIVE  NEGATIVE mg/dL    Urobilinogen, UA 0.2  0.0 - 1.0 mg/dL    Nitrite NEGATIVE  NEGATIVE    Leukocytes, UA NEGATIVE  NEGATIVE   URINE MICROSCOPIC-ADD ON     Status: Abnormal   Collection Time   10/03/11 12:32 PM      Component  Value Range Comment   Squamous Epithelial / LPF FEW (*) RARE    RBC / HPF 3-6  <3 RBC/hpf    Bacteria, UA FEW (*) RARE   COMPREHENSIVE METABOLIC PANEL     Status: Abnormal   Collection Time   10/03/11 12:45 PM      Component Value Range Comment   Sodium 139  135 - 145 mEq/L    Potassium 4.7  3.5 - 5.1 mEq/L    Chloride 101  96 - 112 mEq/L    CO2 29  19 - 32 mEq/L    Glucose, Bld 121 (*) 70 - 99 mg/dL    BUN 11  6 - 23 mg/dL    Creatinine, Ser 1.19  0.50 - 1.10 mg/dL    Calcium 14.7  8.4 - 10.5 mg/dL    Total Protein 8.4 (*) 6.0 - 8.3 g/dL    Albumin 3.9  3.5 - 5.2 g/dL    AST 17  0 - 37 U/L    ALT 10  0 - 35 U/L    Alkaline Phosphatase 87  39 - 117 U/L    Total Bilirubin 0.3  0.3 - 1.2 mg/dL    GFR calc non Af Amer 90 (*) >90 mL/min    GFR calc Af Amer >90  >90 mL/min   CARDIAC PANEL(CRET KIN+CKTOT+MB+TROPI)     Status: Normal   Collection Time   10/03/11 12:45 PM      Component Value Range Comment   Total CK 72  7 - 177 U/L    CK, MB 2.4  0.3 - 4.0 ng/mL    Troponin I <0.30  <0.30 ng/mL    Relative Index RELATIVE INDEX IS INVALID  0.0 - 2.5   D-DIMER, QUANTITATIVE     Status: Abnormal   Collection Time   10/03/11 12:45 PM      Component Value Range Comment   D-Dimer, Quant 0.49 (*) 0.00 - 0.48 ug/mL-FEU   POCT I-STAT TROPONIN I     Status: Normal   Collection Time   10/03/11  3:54 PM      Component Value Range Comment   Troponin i, poc 0.00  0.00 - 0.08 ng/mL  Comment 3            MRSA PCR SCREENING     Status: Normal   Collection Time   10/03/11  4:52 PM      Component Value Range Comment   MRSA by PCR NEGATIVE  NEGATIVE   CARDIAC PANEL(CRET KIN+CKTOT+MB+TROPI)     Status: Normal   Collection Time   10/03/11  6:47 PM      Component Value Range Comment   Total CK 70  7 - 177 U/L    CK, MB 2.3  0.3 - 4.0 ng/mL    Troponin I <0.30  <0.30 ng/mL    Relative Index RELATIVE INDEX IS INVALID  0.0 - 2.5   PRO B NATRIURETIC PEPTIDE     Status: Abnormal   Collection Time     10/03/11  6:47 PM      Component Value Range Comment   Pro B Natriuretic peptide (BNP) 758.3 (*) 0 - 125 pg/mL   CARDIAC PANEL(CRET KIN+CKTOT+MB+TROPI)     Status: Normal   Collection Time   10/04/11 12:23 AM      Component Value Range Comment   Total CK 73  7 - 177 U/L    CK, MB 2.1  0.3 - 4.0 ng/mL    Troponin I <0.30  <0.30 ng/mL    Relative Index RELATIVE INDEX IS INVALID  0.0 - 2.5   CARDIAC PANEL(CRET KIN+CKTOT+MB+TROPI)     Status: Normal   Collection Time   10/04/11  6:04 AM      Component Value Range Comment   Total CK 75  7 - 177 U/L    CK, MB 2.0  0.3 - 4.0 ng/mL    Troponin I <0.30  <0.30 ng/mL    Relative Index RELATIVE INDEX IS INVALID  0.0 - 2.5   BASIC METABOLIC PANEL     Status: Abnormal   Collection Time   10/04/11  6:05 AM      Component Value Range Comment   Sodium 138  135 - 145 mEq/L    Potassium 3.9  3.5 - 5.1 mEq/L    Chloride 101  96 - 112 mEq/L    CO2 25  19 - 32 mEq/L    Glucose, Bld 114 (*) 70 - 99 mg/dL    BUN 12  6 - 23 mg/dL    Creatinine, Ser 0.98  0.50 - 1.10 mg/dL    Calcium 9.6  8.4 - 11.9 mg/dL    GFR calc non Af Amer 90 (*) >90 mL/min    GFR calc Af Amer >90  >90 mL/min   CBC     Status: Normal   Collection Time   10/04/11  6:05 AM      Component Value Range Comment   WBC 9.0  4.0 - 10.5 K/uL    RBC 4.44  3.87 - 5.11 MIL/uL    Hemoglobin 12.5  12.0 - 15.0 g/dL    HCT 14.7  82.9 - 56.2 %    MCV 85.4  78.0 - 100.0 fL    MCH 28.2  26.0 - 34.0 pg    MCHC 33.0  30.0 - 36.0 g/dL    RDW 13.0  86.5 - 78.4 %    Platelets 263  150 - 400 K/uL   HEMOGLOBIN A1C     Status: Abnormal   Collection Time   10/04/11  6:05 AM      Component Value Range Comment   Hemoglobin A1C 6.1 (*) <5.7 %  Mean Plasma Glucose 128 (*) <117 mg/dL   LIPID PANEL     Status: Abnormal   Collection Time   10/04/11  6:05 AM      Component Value Range Comment   Cholesterol 266 (*) 0 - 200 mg/dL    Triglycerides 295 (*) <150 mg/dL    HDL 31 (*) >62 mg/dL    Total  CHOL/HDL Ratio 8.6      VLDL 35  0 - 40 mg/dL    LDL Cholesterol 130 (*) 0 - 99 mg/dL   GLUCOSE, CAPILLARY     Status: Normal   Collection Time   10/04/11  9:15 AM      Component Value Range Comment   Glucose-Capillary 95  70 - 99 mg/dL     Disposition:  Follow-up Information    Follow up with Abelino Derrick, PA. (office will call)    Contact information:   [address] [place] [address] [phone]          Discharge Medications:  Medication List  As of 10/04/2011  4:59 PM   TAKE these medications         aspirin 325 MG EC tablet   Take 1 tablet (325 mg total) by mouth daily.      lisinopril 20 MG tablet   Commonly known as: PRINIVIL,ZESTRIL   Take 1 tablet (20 mg total) by mouth daily.      metoprolol tartrate 12.5 mg Tabs   Commonly known as: LOPRESSOR   Take 0.5 tablets (12.5 mg total) by mouth 2 (two) times daily.      rosuvastatin 5 MG tablet   Commonly known as: CRESTOR   Take 1 tablet (5 mg total) by mouth at bedtime.      TYLENOL 325 MG tablet   Generic drug: acetaminophen   Take 650 mg by mouth every 6 (six) hours as needed. Pain              Duration of Discharge Encounter: Greater than 30 minutes including physician time.  Jolene Provost PA-C 10/04/2011 4:59 PM

## 2011-10-04 NOTE — Progress Notes (Signed)
Pt received discharge instructions with no further questions. She is aware of how to take new medications, where to get them, and when to call the doctor. She is aware of follow up appointment. Encouraged to take BP as much as possible and encourage to stop smoking. Stable for DC. Family to take home. Duwaine Maxin, RN

## 2011-10-04 NOTE — Plan of Care (Signed)
Problem: Phase I Progression Outcomes Goal: Initial discharge plan identified Outcome: Completed/Met Date Met:  10/04/11 Home with family, PCP follow up

## 2011-10-04 NOTE — Progress Notes (Signed)
Subjective:  No chest pain or SOB. Her B/P came down quickly, she is now off Labetalol.  Objective:  Vital Signs in the last 24 hours: Temp:  [97.9 F (36.6 C)-98.2 F (36.8 C)] 98.2 F (36.8 C) (07/16 0315) Pulse Rate:  [63-99] 66  (07/16 0100) Resp:  [16-22] 16  (07/16 0600) BP: (117-229)/(57-103) 136/57 mmHg (07/16 0600) SpO2:  [96 %-100 %] 97 % (07/16 0315) Weight:  [78 kg (171 lb 15.3 oz)-78.019 kg (172 lb)] 78 kg (171 lb 15.3 oz) (07/15 1838)  Intake/Output from previous day:  Intake/Output Summary (Last 24 hours) at 10/04/11 0807 Last data filed at 10/04/11 0500  Gross per 24 hour  Intake    357 ml  Output   1650 ml  Net  -1293 ml    Physical Exam: General appearance: alert, cooperative and no distress Neck: no JVD, supple, symmetrical, trachea midline, thyroid not enlarged, symmetric, no tenderness/mass/nodules and bilat carotid bruits Lungs: clear to auscultation bilaterally Heart: regular rate and rhythm and soft sytolic murmur AOV area.   Rate: 68  Rhythm: normal sinus rhythm  Lab Results:  Basename 10/04/11 0605 10/03/11 1158  WBC 9.0 7.9  HGB 12.5 14.1  PLT 263 282    Basename 10/04/11 0605 10/03/11 1245  NA 138 139  K 3.9 4.7  CL 101 101  CO2 25 29  GLUCOSE 114* 121*  BUN 12 11  CREATININE 0.70 0.70    Basename 10/04/11 0604 10/04/11 0023  TROPONINI <0.30 <0.30   Hepatic Function Panel  Basename 10/03/11 1245  PROT 8.4*  ALBUMIN 3.9  AST 17  ALT 10  ALKPHOS 87  BILITOT 0.3  BILIDIR --  IBILI --   No results found for this basename: CHOL in the last 72 hours No results found for this basename: INR in the last 72 hours  Imaging: Imaging results have been reviewed  Cardiac Studies:  Assessment/Plan:   Principal Problem:  *Dizziness Active Problems:  Hypertension, malignant  PAD, high grade RICA by MRI 10/03/11  Renal artery stenosis: left 50% 2005  Non compliance w medication regimen secondary to cost  HYPERLIPIDEMIA  Tobacco abuse  LVH (left ventricular hypertrophy)  Plan- Add low dose po beta blocker and ACE, (keep meds generic). Echo ordered. Tx to telemetry.    [employer] PA-C 10/04/2011, 8:07 AM

## 2011-10-04 NOTE — Progress Notes (Signed)
Pt. Seen and examined. Agree with the NP/PA-C note as written.  Feels much better today. Good response to medications for hypertensive emergency. Plan for 2D echo today (EKG shows high voltage and LV strain).  She will need outpatient carotid and renal dopplers.  Insurance coverage set to "kick-in" in September (suspect medicare). Could have social work see if she would be eligible early for medicaid? Still with some dizziness in change of position - need to be careful not to run BP too low .Marland Kitchen Shoot for SBP around 140-160.  Possible d/c later this afternoon.  Chrystie Nose, MD, [facility] Attending Cardiologist [facility]

## 2011-10-04 NOTE — Progress Notes (Signed)
  Echocardiogram 2D Echocardiogram has been performed.  Katai Marsico, [facility] 10/04/2011, 10:17 AM

## 2011-12-28 ENCOUNTER — Encounter (HOSPITAL_COMMUNITY): Payer: Self-pay | Admitting: Pharmacy Technician

## 2012-01-02 ENCOUNTER — Other Ambulatory Visit: Payer: Self-pay | Admitting: Cardiovascular Disease

## 2012-01-06 ENCOUNTER — Ambulatory Visit (HOSPITAL_COMMUNITY)
Admission: RE | Admit: 2012-01-06 | Discharge: 2012-01-06 | Disposition: A | Discharge status: Home / Self Care | Payer: Medicare PPO | Source: Ambulatory Visit | Attending: Cardiovascular Disease | Admitting: Cardiovascular Disease

## 2012-01-06 ENCOUNTER — Encounter (HOSPITAL_COMMUNITY)
Admission: RE | Disposition: A | Discharge status: Home / Self Care | Payer: Self-pay | Source: Ambulatory Visit | Attending: Cardiovascular Disease

## 2012-01-06 DIAGNOSIS — I10 Essential (primary) hypertension: Secondary | ICD-10-CM | POA: Insufficient documentation

## 2012-01-06 DIAGNOSIS — E785 Hyperlipidemia, unspecified: Secondary | ICD-10-CM | POA: Insufficient documentation

## 2012-01-06 DIAGNOSIS — I6529 Occlusion and stenosis of unspecified carotid artery: Secondary | ICD-10-CM | POA: Insufficient documentation

## 2012-01-06 DIAGNOSIS — I658 Occlusion and stenosis of other precerebral arteries: Secondary | ICD-10-CM | POA: Insufficient documentation

## 2012-01-06 DIAGNOSIS — I70219 Atherosclerosis of native arteries of extremities with intermittent claudication, unspecified extremity: Secondary | ICD-10-CM | POA: Insufficient documentation

## 2012-01-06 DIAGNOSIS — F17200 Nicotine dependence, unspecified, uncomplicated: Secondary | ICD-10-CM | POA: Insufficient documentation

## 2012-01-06 HISTORY — PX: CAROTID ANGIOGRAM: SHX5504

## 2012-01-06 HISTORY — PX: LOWER EXTREMITY ANGIOGRAM: SHX5508

## 2012-01-06 LAB — BASIC METABOLIC PANEL WITH GFR
BUN: 10 mg/dL (ref 6–23)
CO2: 25 meq/L (ref 19–32)
Calcium: 9.4 mg/dL (ref 8.4–10.5)
Chloride: 105 meq/L (ref 96–112)
Creatinine, Ser: 0.69 mg/dL (ref 0.50–1.10)
GFR calc Af Amer: >90 mL/min
GFR calc non Af Amer: 89 mL/min — ABNORMAL LOW
Glucose, Bld: 113 mg/dL — ABNORMAL HIGH (ref 70–99)
Potassium: 5.4 meq/L — ABNORMAL HIGH (ref 3.5–5.1)
Sodium: 142 meq/L (ref 135–145)

## 2012-01-06 LAB — CBC
HCT: 36.5 % (ref 36.0–46.0)
Hemoglobin: 12.2 g/dL (ref 12.0–15.0)
MCH: 28.4 pg (ref 26.0–34.0)
MCHC: 33.4 g/dL (ref 30.0–36.0)
MCV: 85.1 fL (ref 78.0–100.0)
Platelets: 300 10*3/uL (ref 150–400)
RBC: 4.29 MIL/uL (ref 3.87–5.11)
RDW: 15.3 % (ref 11.5–15.5)
WBC: 6.8 10*3/uL (ref 4.0–10.5)

## 2012-01-06 SURGERY — CAROTID ANGIOGRAM
Anesthesia: LOCAL

## 2012-01-06 MED ORDER — LIDOCAINE HCL (PF) 1 % IJ SOLN
INTRAMUSCULAR | Status: AC
  Filled 2012-01-06: qty 30

## 2012-01-06 MED ORDER — MORPHINE SULFATE 2 MG/ML IJ SOLN
INTRAMUSCULAR | Status: AC
  Filled 2012-01-06: qty 1

## 2012-01-06 MED ORDER — MORPHINE SULFATE 4 MG/ML IJ SOLN
1.0000 mg | INTRAMUSCULAR | Status: DC | PRN
  Administered 2012-01-06: 1 mg via INTRAVENOUS

## 2012-01-06 MED ORDER — ONDANSETRON HCL 4 MG/2ML IJ SOLN: 4.0000 mg | Freq: Four times a day (QID) | INTRAMUSCULAR | Status: DC | PRN

## 2012-01-06 MED ORDER — MIDAZOLAM HCL 2 MG/2ML IJ SOLN
INTRAMUSCULAR | Status: AC
  Filled 2012-01-06: qty 2

## 2012-01-06 MED ORDER — HYDRALAZINE HCL 20 MG/ML IJ SOLN: 10.0000 mg | INTRAMUSCULAR | Status: DC

## 2012-01-06 MED ORDER — FENTANYL CITRATE 0.05 MG/ML IJ SOLN
INTRAMUSCULAR | Status: AC
  Filled 2012-01-06: qty 2

## 2012-01-06 MED ORDER — HYDRALAZINE HCL 20 MG/ML IJ SOLN
INTRAMUSCULAR | Status: AC
  Filled 2012-01-06: qty 1

## 2012-01-06 MED ORDER — SODIUM CHLORIDE 0.9 % IJ SOLN: 3.0000 mL | INTRAMUSCULAR | Status: DC | PRN

## 2012-01-06 MED ORDER — SODIUM CHLORIDE 0.9 % IV SOLN: INTRAVENOUS | Status: DC

## 2012-01-06 MED ORDER — SODIUM CHLORIDE 0.9 % IV SOLN
INTRAVENOUS | Status: DC
  Administered 2012-01-06: 07:00:00 via INTRAVENOUS

## 2012-01-06 MED ORDER — ACETAMINOPHEN 325 MG PO TABS: 650.0000 mg | ORAL_TABLET | ORAL | Status: DC | PRN

## 2012-01-06 NOTE — H&P (Signed)
  H & P will be scanned in.  Pt was reexamined and existing H & P reviewed. No changes found.  Runell Gess, MD [facility] 01/06/2012 9:43 AM

## 2012-01-06 NOTE — Op Note (Signed)
Leslie [name] is a 67 y.o. female    [id number] LOCATION:  FACILITY: [facility]  PHYSICIAN: Nanetta Batty, M.D. 16-Jan-1946   DATE OF PROCEDURE:  01/06/2012  DATE OF DISCHARGE:  [facility]  PV Angio    History obtained from chart review. Leslie [name] is a 66 year old moderately overweight worse African American female mother of 2 who I last saw in the office on 12/19/2011. She has a history of peripheral vascular occlusive disease status post right SFA stenting in 2005 at 2006 for claudication with residual moderate left SFA disease. She has a long history of tobacco abuse having recently stopped her other problems include hypertension and hyperlipidemia. She has normal LV function by 2-D echo and a nonischemic Myoview. She does have left pelvic claudication as well as high-grade carotid disease by duplex ultrasound. She presents now for cerebral angiography as well as abdominal aortography and bifemoral runoff potential endovascular treatment of her left lower extremity her lifestyle limiting claudication.   PROCEDURE DESCRIPTION:    The patient was brought to the second floor  [place] [facility] cath lab in the postabsorptive state. She was premedicated with IV Versed and fentanyl at the end of the case.Marland Kitchen Her right groinwas prepped and shaved in usual sterile fashion. Xylocaine 1% was used for local anesthesia. A 5 French sheath was inserted into the right common femoral  artery using standard Seldinger technique. A 5 French pigtail catheter was used for aortic arch angiography, abdominal aortography with bifemoral runoff using bolus chase digital subtraction spelled table technique. A 5 [place] JP 1 catheters was used for carotid angiography imaging both intra-and extracranial views. Intracranial anatomy will be interpreted by neuro interventional radiology. Visipaque dye was used for the entirety of the case. Retrograde aortic pressure was monitored in the  case.  HEMODYNAMICS:    AO SYSTOLIC/AO DIASTOLIC: 211/98   ANGIOGRAPHIC RESULTS:   1: Aortic arch angiogram-type I aortic arch  2: Right carotid artery-80-90% calcified eccentric distal common carotid/proximal right ICA stenosis. There was 80% diffuse segmental proximal to mid right ICA stenosis beyond the calcified segment.  3: Left carotid artery-30% distal left common carotid/ostial left internal carotid artery stenosis. There was disease at the origin of the left external carotid artery.   4: abdominal aortogram-renal arteries were widely patent. The infrarenal abdominal aorta revealed moderate atherosclerotic changes without significant narrowing or aneurysmal dilatation.   5: left lower extremity-60% ostial left common iliac artery stenosis. The left SFA was diffusely diseased with origin down to the adductor canal in the 80+ percent range with fluoroscopic calcification. There was two-vessel runoff below the knee with occluded posterior tibial.  6: Right lower extremity-occluded right SFA at its origin with reconstitution in the above-the-knee popliteal with three-vessel runoff.  IMPRESSION:this lady has a high-grade distal right common carotid, proximal right internal carotid as well as segmental mid right internal carotid artery stenosis. She also has diffuse SFA disease bilaterally not amenable to percutaneous intervention. Continue medical therapy of her lower extremity occlusive disease is recommended. I cannot feel that she is a candidate for surgical revascularization at this point. She'll be hydrated and discharged home later today as an outpatient. She'll see me back in the office in 2-3 weeks. I will refer her to Dr. Durene Cal for evaluation of her right carotid artery disease.  Runell Gess MD, [facility] 01/06/2012 10:34 AM

## 2012-01-30 NOTE — Consult Note (Signed)
NAMESHERISA, GILVIN                  ACCOUNT NO.:  [id number]  MEDICAL RECORD NO.:  [id number]  LOCATION:                                 FACILITY:  PHYSICIAN:  Narmeen Kerper K. Barbra Miner, M.D.DATE OF BIRTH:  08/28/1945  DATE OF CONSULTATION: DATE OF DISCHARGE:  01/06/2012                                CONSULTATION   CLINICAL HISTORY:  Patient with progressive right internal carotid artery stenosis  by ultrasound.  EXAMINATION:  Intracranial interpretation of bilateral common carotid arteriograms.  The right common carotid arteriogram demonstrates the distal cervical  segment of the right internal carotid to be normal.  The petrous, cavernous, and supraclinoid segments are also normal.  The right middle cerebral artery demonstrates a mild focal stenosis in Its proximal  M1 segment.  More distally, the vessel is seen  To opacify normally into the capillary and venous phases.  A hypoplastic right anterior cerebral artery is seen opacifying into the A3 branches.  The left common carotid arteriogram demonstrates the left internal carotid artery in the cervical/ petrous junction to be normal.  The petrous, cavernous, and supraclinoid segments are normal.  The left middle and the left anterior cerebral arteries  to opacify  normally into the capillary and venous phases.  Cross opacification via the anterior communicating artery of the right anterior cerebral A2 segment is seen.  IMPRESSION:  1. Suggestion of mild atherosclerotic intracranial disease involving the right middle cerebral artery.  The remainder of the examination is unremarkable.          ______________________________ Grandville Silos. Corliss Skains, M.D.     SKD/MEDQ  D:  01/27/2012  T:  01/27/2012  Job:  [id number]

## 2012-03-09 ENCOUNTER — Encounter: Payer: Self-pay | Admitting: Surgery

## 2012-03-12 ENCOUNTER — Ambulatory Visit (INDEPENDENT_AMBULATORY_CARE_PROVIDER_SITE_OTHER): Payer: Medicare PPO | Admitting: Surgery

## 2012-03-12 ENCOUNTER — Encounter: Payer: Self-pay | Admitting: Surgery

## 2012-03-12 VITALS — BP 153/90 | HR 86 | Ht 67.5 in | Wt 177.0 lb

## 2012-03-12 DIAGNOSIS — I6529 Occlusion and stenosis of unspecified carotid artery: Secondary | ICD-10-CM | POA: Insufficient documentation

## 2012-03-12 NOTE — Progress Notes (Signed)
[facility] [place]   Patient name: Leslie [name] MRN: [id number] DOB: 09-Mar-1946 Sex: female   Referred by: Dr. Allyson Sabal  Reason for referral:  Chief Complaint  Patient presents with  . New Evaluation    carotid stenosis/Dr. Allyson Sabal    HISTORY OF PRESENT ILLNESS: This is a very pleasant female referred for evaluation of carotid stenosis. She is been followed for many years with serial carotid ultrasound. This recently showed progression of disease. She underwent arteriogram which revealed 80% right carotid stenosis. She was referred for surgical evaluation. She has a history of peripheral vascular disease having undergone stenting in bilateral superficial femoral arteries. Regarding her carotid artery occlusive disease, she remains asymptomatic. Specifically she denies numbness or weakness in either extremity. She denies slurred speech. She denies ever as 2 tacks. She is medically managed for hypertension and hyperlipidemia. She continues to have problems with claudication. She is on double antiplatelet therapy with aspirin and Plavix. She is a current smoker  Past Medical History  Diagnosis Date  . PAD (peripheral artery disease)   . HTN (hypertension), benign   . Tobacco abuse   . Rheumatoid arthritis   . Carotid artery occlusion   . CAD (coronary artery disease)   . Hyperlipidemia     Past Surgical History  Procedure Date  . Back surgery   . Leg stents   . Total knee arthroplasty     History   Social History  . Marital Status: Divorced    Spouse Name: N/A    Number of Children: N/A  . Years of Education: N/A   Occupational History  . Not on file.   Social History Main Topics  . Smoking status: Current Some Day Smoker -- 0.5 packs/day for 50 years    Types: Cigarettes  . Smokeless tobacco: Never Used     Comment: pt states that she has had 3 cigs in the last 8 weeks  . Alcohol Use: No  . Drug Use: No  . Sexually Active: Not on file   Other  Topics Concern  . Not on file   Social History Narrative  . No narrative on file    Family History  Problem Relation Age of Onset  . Hyperlipidemia Mother   . Hypertension Mother   . Cancer Father   . Diabetes Sister   . Hypertension Sister   . Diabetes Brother   . Heart disease Brother   . Hypertension Brother   . Heart attack Brother     Allergies as of 03/12/2012 - Review Complete 03/12/2012  Allergen Reaction Noted  . Latex Other (See Comments) 10/03/2011  . Tape Other (See Comments) 10/03/2011  . Lipitor (atorvastatin) Other (See Comments) 10/04/2011    Current Outpatient Prescriptions on File Prior to Visit  Medication Sig Dispense Refill  . acetaminophen (TYLENOL) 500 MG tablet Take 500 mg by mouth every 6 (six) hours as needed. For pain      . aspirin EC 325 MG tablet Take 325 mg by mouth daily.      Marland Kitchen atorvastatin (LIPITOR) 20 MG tablet Take 40 mg by mouth daily.       . clopidogrel (PLAVIX) 75 MG tablet Take 75 mg by mouth daily.      Marland Kitchen losartan (COZAAR) 100 MG tablet Take 100 mg by mouth daily.      Marland Kitchen lisinopril (PRINIVIL,ZESTRIL) 20 MG tablet Take 1 tablet (20 mg total) by mouth daily.  30 tablet  11  REVIEW OF SYSTEMS: Cardiovascular: No chest pain, chest pressure, palpitations, orthopnea, or dyspnea on exertion.  No history of DVT or phlebitis. Pulmonary: No productive cough, asthma or wheezing. Neurologic: No weakness, paresthesias, aphasia, or amaurosis. No dizziness. Hematologic: No bleeding problems or clotting disorders. Musculoskeletal: No joint pain or joint swelling. Gastrointestinal: No blood in stool or hematemesis Genitourinary: No dysuria or hematuria. Psychiatric:: No history of major depression. Integumentary: No rashes or ulcers. Constitutional: No fever or chills.  PHYSICAL EXAMINATION: General: The patient appears their stated age.  Vital signs are BP 153/90  Pulse 86  Ht 5' 7.5" (1.715 m)  Wt 177 lb (80.287 kg)  BMI 27.31 kg/m2   SpO2 100% HEENT:  No gross abnormalities Pulmonary: Respirations are non-labored Abdomen: Soft and non-tender  Musculoskeletal: There are no major deformities.   Neurologic: No focal weakness or paresthesias are detected, Skin: There are no ulcer or rashes noted. Psychiatric: The patient has normal affect. Cardiovascular: There is a regular rate and rhythm without significant murmur appreciated. No carotid bruit  Diagnostic Studies: I have reviewed her angiogram. There is a high-grade right carotid stenosis with approximately 30% left carotid stenosis  Outside Studies/Documentation Historical records were reviewed.  They showed asymptomatic high-grade right carotid stenosis  Medication Changes: None  Assessment:  Asymptomatic right carotid stenosis Plan: From a cardiac perspective the patient would be low risk given her nonischemic Myoview and normal LV function by 2-D echo. Having reviewed her carotid angiogram however, I am somewhat concerned that her lesion may be difficult to reach surgically. Unfortunately, I cannot window properly the images and so I will need to review them on the Storz machine. Therefore, I cannot make a definitive decision regarding correction of her carotid stenosis. I have described in detail the carotid endarterectomy procedure including the risks and benefits of nerve injury and stroke. We also discussed the risk for carotid stenting. I told the patient I would contact her once I have reviewed her images and have come up with a definitive decision.     Jorge Ny, M.D. [facility] [place] Office: [phone] Pager:  [phone]

## 2012-04-08 ENCOUNTER — Emergency Department (HOSPITAL_COMMUNITY)
Admission: EM | Admit: 2012-04-08 | Discharge: 2012-04-08 | Disposition: A | Discharge status: Home / Self Care | Payer: Medicare PPO | Attending: Emergency Medicine | Admitting: Emergency Medicine

## 2012-04-08 ENCOUNTER — Emergency Department (HOSPITAL_COMMUNITY): Payer: Medicare PPO

## 2012-04-08 ENCOUNTER — Encounter (HOSPITAL_COMMUNITY): Payer: Self-pay | Admitting: *Deleted

## 2012-04-08 DIAGNOSIS — Z79899 Other long term (current) drug therapy: Secondary | ICD-10-CM | POA: Insufficient documentation

## 2012-04-08 DIAGNOSIS — E785 Hyperlipidemia, unspecified: Secondary | ICD-10-CM | POA: Insufficient documentation

## 2012-04-08 DIAGNOSIS — R079 Chest pain, unspecified: Secondary | ICD-10-CM

## 2012-04-08 DIAGNOSIS — Z8739 Personal history of other diseases of the musculoskeletal system and connective tissue: Secondary | ICD-10-CM | POA: Insufficient documentation

## 2012-04-08 DIAGNOSIS — R0789 Other chest pain: Secondary | ICD-10-CM | POA: Insufficient documentation

## 2012-04-08 DIAGNOSIS — Z8679 Personal history of other diseases of the circulatory system: Secondary | ICD-10-CM | POA: Insufficient documentation

## 2012-04-08 DIAGNOSIS — I2510 Atherosclerotic heart disease of native coronary artery without angina pectoris: Secondary | ICD-10-CM | POA: Insufficient documentation

## 2012-04-08 DIAGNOSIS — F17200 Nicotine dependence, unspecified, uncomplicated: Secondary | ICD-10-CM | POA: Insufficient documentation

## 2012-04-08 DIAGNOSIS — I10 Essential (primary) hypertension: Secondary | ICD-10-CM | POA: Insufficient documentation

## 2012-04-08 DIAGNOSIS — Z7982 Long term (current) use of aspirin: Secondary | ICD-10-CM | POA: Insufficient documentation

## 2012-04-08 NOTE — ED Provider Notes (Signed)
History     CSN: [id number]  Arrival date & time 04/08/12  4098   First MD Initiated Contact with Patient 04/08/12 [phone]      Chief Complaint  Patient presents with  . Chest Pain     Patient is a 67 y.o. female presenting with chest pain. The history is provided by the patient.  Chest Pain Episode onset: just prior to arrival. Chest pain occurs constantly. The chest pain is unchanged. The pain is associated with stress. The severity of the pain is mild. The quality of the pain is described as pressure-like. The pain does not radiate. Pertinent negatives for primary symptoms include no shortness of breath, no vomiting and no dizziness.  Pertinent negatives for associated symptoms include no weakness.   Pt was here in the ER with another family member and she developed chest pressure.  She had otherwise been well.  She has no other complaints  Past Medical History  Diagnosis Date  . PAD (peripheral artery disease)   . HTN (hypertension), benign   . Tobacco abuse   . Rheumatoid arthritis   . Carotid artery occlusion   . CAD (coronary artery disease)   . Hyperlipidemia     Past Surgical History  Procedure Date  . Back surgery   . Leg stents   . Total knee arthroplasty     Family History  Problem Relation Age of Onset  . Hyperlipidemia Mother   . Hypertension Mother   . Cancer Father   . Diabetes Sister   . Hypertension Sister   . Diabetes Brother   . Heart disease Brother   . Hypertension Brother   . Heart attack Brother     History  Substance Use Topics  . Smoking status: Current Some Day Smoker -- 0.5 packs/day for 50 years    Types: Cigarettes  . Smokeless tobacco: Never Used     Comment: pt states that she has had 3 cigs in the last 8 weeks  . Alcohol Use: No    OB History    Grav Para Term Preterm Abortions TAB SAB Ect Mult Living                  Review of Systems  Respiratory: Negative for shortness of breath.   Cardiovascular: Positive for chest  pain.  Gastrointestinal: Negative for vomiting.  Neurological: Negative for dizziness and weakness.    Allergies  Latex; Tape; and Lipitor  Home Medications   Current Outpatient Rx  Name  Route  Sig  Dispense  Refill  . ACETAMINOPHEN 500 MG PO TABS   Oral   Take 500 mg by mouth every 6 (six) hours as needed. For pain         . ASPIRIN EC 325 MG PO TBEC   Oral   Take 325 mg by mouth daily.         . ATORVASTATIN CALCIUM 20 MG PO TABS   Oral   Take 40 mg by mouth daily.          Marland Kitchen CLOPIDOGREL BISULFATE 75 MG PO TABS   Oral   Take 75 mg by mouth daily.         Marland Kitchen LISINOPRIL 20 MG PO TABS   Oral   Take 1 tablet (20 mg total) by mouth daily.   30 tablet   11   . LOSARTAN POTASSIUM 100 MG PO TABS   Oral   Take 100 mg by mouth daily.  BP 215/86  Pulse 117  Temp 97.2 F (36.2 C) (Oral)  Resp 20  SpO2 100%  Physical Exam CONSTITUTIONAL: Well developed/well nourished, anxious HEAD AND FACE: Normocephalic/atraumatic EYES: EOMI ENMT: Mucous membranes moist NECK: supple no meningeal signs CV: S1/S2 noted, no murmurs/rubs/gallops noted LUNGS: Lungs are clear to auscultation bilaterally, no apparent distress ABDOMEN: soft, nontender, no rebound or guarding NEURO: Pt is awake/alert, moves all extremitiesx4 EXTREMITIES: pulses normal, full ROM SKIN: warm, color normal PSYCH: anxious  ED Course  Procedures   1. Chest pain   2. HTN (hypertension)    Pt was initially in the ED with family and upon hearing of bad news she had chest pressure.  She would like to be discharged immediately to be with family.  Her EKG is abnormal, however it is minimally changed from prior and no STEMI noted.  She is in no distress, just mildly anxious.  I advised that chest pain evaluation usually entails CXR/labs and her physical exam and EKG are only part of the initial workup and I can not rule out a life threatening event.  She understands this and will return if there  are any worsening symptoms   MDM  Nursing notes including past medical history and social history reviewed and considered in documentation        Date: 04/08/2012  Rate: 114  Rhythm: sinus tachycardia  QRS Axis: normal  Intervals: normal  ST/T Wave abnormalities: ST depressions inferiorly and ST depressions laterally  Conduction Disutrbances:none  Narrative Interpretation:   Old EKG Reviewed: unchanged    Joya Gaskins, MD 04/08/12 [phone]

## 2012-04-08 NOTE — ED Notes (Signed)
Cp that started upon arriving.  Here with another pt when pain started.  Pt tearful upon triage.  Denies sob/n/v/weakness.

## 2012-04-09 ENCOUNTER — Other Ambulatory Visit: Payer: Self-pay

## 2012-04-16 ENCOUNTER — Ambulatory Visit (HOSPITAL_COMMUNITY)
Admission: RE | Admit: 2012-04-16 | Discharge: 2012-04-16 | Disposition: A | Discharge status: Home / Self Care | Payer: Medicare PPO | Source: Ambulatory Visit | Attending: Anesthesiology | Admitting: Anesthesiology

## 2012-04-16 ENCOUNTER — Encounter (HOSPITAL_COMMUNITY)
Admission: RE | Admit: 2012-04-16 | Discharge: 2012-04-16 | Disposition: A | Discharge status: Home / Self Care | Payer: Medicare PPO | Source: Ambulatory Visit | Attending: Surgery | Admitting: Surgery

## 2012-04-16 ENCOUNTER — Encounter (HOSPITAL_COMMUNITY): Payer: Self-pay

## 2012-04-16 DIAGNOSIS — Z01818 Encounter for other preprocedural examination: Secondary | ICD-10-CM | POA: Insufficient documentation

## 2012-04-16 DIAGNOSIS — Z01812 Encounter for preprocedural laboratory examination: Secondary | ICD-10-CM | POA: Insufficient documentation

## 2012-04-16 HISTORY — DX: Personal history of colonic polyps: Z86.010

## 2012-04-16 HISTORY — DX: Atherosclerotic heart disease of native coronary artery without angina pectoris: I25.10

## 2012-04-16 HISTORY — DX: Cough, unspecified: R05.9

## 2012-04-16 HISTORY — DX: Personal history of other diseases of the nervous system and sense organs: Z86.69

## 2012-04-16 HISTORY — DX: Other skin changes: R23.8

## 2012-04-16 HISTORY — DX: Xerosis cutis: L85.3

## 2012-04-16 HISTORY — DX: Panic disorder (episodic paroxysmal anxiety): F41.0

## 2012-04-16 HISTORY — DX: Personal history of colon polyps, unspecified: Z86.0100

## 2012-04-16 HISTORY — DX: Dorsalgia, unspecified: M54.9

## 2012-04-16 HISTORY — DX: Cough: R05

## 2012-04-16 HISTORY — DX: Pneumonia, unspecified organism: J18.9

## 2012-04-16 LAB — COMPREHENSIVE METABOLIC PANEL
ALT: 12 U/L (ref 0–35)
AST: 17 U/L (ref 0–37)
Albumin: 3.7 g/dL (ref 3.5–5.2)
BUN: 11 mg/dL (ref 6–23)
Calcium: 9.6 mg/dL (ref 8.4–10.5)
Chloride: 99 mEq/L (ref 96–112)
Creatinine, Ser: 0.78 mg/dL (ref 0.50–1.10)
Glucose, Bld: 93 mg/dL (ref 70–99)
Potassium: 4.3 mEq/L (ref 3.5–5.1)
Total Protein: 8.1 g/dL (ref 6.0–8.3)

## 2012-04-16 LAB — TYPE AND SCREEN
ABO/RH(D): A POS
Antibody Screen: NEGATIVE

## 2012-04-16 LAB — URINALYSIS, ROUTINE W REFLEX MICROSCOPIC
Leukocytes, UA: NEGATIVE
Protein, ur: NEGATIVE mg/dL
Urobilinogen, UA: 0.2 mg/dL (ref 0.0–1.0)

## 2012-04-16 LAB — CBC
HCT: 36.2 % (ref 36.0–46.0)
Hemoglobin: 11.4 g/dL — ABNORMAL LOW (ref 12.0–15.0)
MCV: 83.8 fL (ref 78.0–100.0)
RDW: 14.6 % (ref 11.5–15.5)
WBC: 7.9 10*3/uL (ref 4.0–10.5)

## 2012-04-16 LAB — APTT: aPTT: 32 seconds (ref 24–37)

## 2012-04-16 LAB — SURGICAL PCR SCREEN: MRSA, PCR: NEGATIVE

## 2012-04-16 NOTE — Progress Notes (Addendum)
Dr.Jonathan Allyson Sabal is Cardiologist with last visit in Dec 2013--with [facility]  Echo in epic from 10/04/11 Stress test done to request report from Dr.Berry Multiple heart caths in epic  EKG in epic from 04/08/12 No CXR within past yr other than [facility]

## 2012-04-16 NOTE — Pre-Procedure Instructions (Signed)
Leslie [name]  04/16/2012   Your procedure is scheduled on:  Fri, Jan 31 @ 7:30 AM  Report to [facility] at 5:30 AM.  Call this number if you have problems the morning of surgery: [phone]   Remember:   Do not eat food or drink liquids after midnight.      Do not wear jewelry, make-up or nail polish.  Do not wear lotions, powders, or perfumes. You may wear deodorant.  Do not shave 48 hours prior to surgery.   Do not bring valuables to the hospital.  Contacts, dentures or bridgework may not be worn into surgery.  Leave suitcase in the car. After surgery it may be brought to your room.  For patients admitted to the hospital, checkout time is 11:00 AM the day of  discharge.   Patients discharged the day of surgery will not be allowed to drive  home.    Special Instructions: Shower using CHG 2 nights before surgery and the night before surgery.  If you shower the day of surgery use CHG.  Use special wash - you have one bottle of CHG for all showers.  You should use approximately 1/3 of the bottle for each shower.   Please read over the following fact sheets that you were given: Pain Booklet, Coughing and Deep Breathing, Blood Transfusion Information, MRSA Information and Surgical Site Infection Prevention

## 2012-04-16 NOTE — Progress Notes (Signed)
Pt states she has never had knee replacement but I cannot get it to come off of the assessment

## 2012-04-17 ENCOUNTER — Encounter (HOSPITAL_COMMUNITY): Payer: Self-pay | Admitting: Vascular Surgery

## 2012-04-17 NOTE — Consult Note (Signed)
Anesthesia Chart Review:  Patient is a 67 year old female scheduled for right CEA by Dr. Myra Gianotti on 04/20/12.  History includes smoking, PAD s/p right SFA stent '05 and '06, RA, HLD, HTN, PNA, panic attacks, migraines.  History also lists  CAD, but I have not found any evidence to support this--although she does have multiple risk factors and may very well have underlying CAD (she denies known history of MI/CHF or coronary intervention).  Dr. Allyson Sabal is her Cardiologist, but he has primarily been following her for PAD.  He did order a Myoview stress study that was done on 12/29/11 and showed normal myocardial perfusion, EF 48%, global LV systolic function mildly reduced, mild global hypokinesis.  Findings consistent with non-ischemia cardiomyopathy.  This was reviewed by Dr. Allyson Sabal and then patient was referred to Dr. Myra Gianotti for consideration of elective right CEA with plans for right FPBG in the future.  EKG on 04/08/12 showed ST @ 114, LVH, cannot rule out septal infarct, marked ST abnormality, possible inferolateral subendocardial injury.  ST depression is a little more prominent since 10/04/11.  By her PAT RN account, EKG was done in the ED when she presented for chest pain after learning about the death of her 65 month old great grandchild.  She was briefly evaluated by an ED physician who felt her EKG was minimally changed without signs of STEMI noted. She was mildly anxious, but in no acute distress.  He advised labs and CXR as part of her work-up, but she refused and wanted to be with her family.  I was not asked to evaluate patient during her PAT visit, but reportedly she did not report any further chest pain.  I did call her, and she denied further chest pains, SOB, or new LE edema.  She felt she experienced a panic attack.  I spoke with Dr. Allyson Sabal today regarding her ED visit with chest pain associated with a significant stressor, EKG findings that showed slightly more pronounced ST depression, and that  patient refused further evaluation.  Since patient had a low risk stress in October 2013, he did not feel he would pursue further cardiology work-up at this time if she was no longer symptomatic.    Echo on 10/04/11 showed: - Left ventricle: The cavity sizeis small. There was severe concentric hypertrophy. The estimated ejection fraction was 80%. Wall motion was normal; there were no regional wall motion abnormalities. The left ventricle is hyperdynamic with mid-systolic LV cavity obliteration and an increased gradient in the ventricle. Doppler parameters are consistent with abnormal left ventricular relaxation (grade 1 diastolic dysfunction). The E/e' ratio is >25, suggesting very high LV filling pressure. - Aortic valve: Sclerosis without stenosis. - Mitral valve: Mildly thickened leaflets . - Left atrium: Moderately dilated (39 ml/m2) - Right ventricle: The cavity size was normal. Wall thickness was normal. The moderator band was prominent. - Atrial septum: There is bowing of the IAS from left to right, suggesting elevated LA pressure. - Tricuspid valve: Mild regurgitation. - Pulmonary arteries: PA peak pressure: 40mm Hg (S). - Inferior vena cava: The vessel was normal in size; the respirophasic diameter changes were in the normal range (= 50%); findings are consistent with normal central venous pressure. - Pericardium, extracardiac: There was no pericardial effusion.  CXR was normal on 04/16/12.  Preoperative labs noted.  If no significant change in her status then would anticipate she could proceed as planned.  Shonna Chock, PA-C 04/17/12 1235

## 2012-04-19 MED ORDER — DEXTROSE 5 % IV SOLN
1.5000 g | INTRAVENOUS | Status: AC
  Administered 2012-04-20: 1.5 g via INTRAVENOUS
  Filled 2012-04-19: qty 1.5

## 2012-04-19 MED ORDER — SODIUM CHLORIDE 0.9 % IV SOLN: INTRAVENOUS | Status: DC

## 2012-04-20 ENCOUNTER — Inpatient Hospital Stay (HOSPITAL_COMMUNITY): Payer: Medicare PPO | Admitting: Vascular Surgery

## 2012-04-20 ENCOUNTER — Encounter (HOSPITAL_COMMUNITY): Payer: Self-pay | Admitting: *Deleted

## 2012-04-20 ENCOUNTER — Encounter (HOSPITAL_COMMUNITY): Payer: Self-pay | Admitting: Vascular Surgery

## 2012-04-20 ENCOUNTER — Inpatient Hospital Stay (HOSPITAL_COMMUNITY)
Admission: RE | Admit: 2012-04-20 | Discharge: 2012-04-22 | DRG: 039 | Disposition: A | Discharge status: Home / Self Care | Payer: Medicare PPO | Source: Ambulatory Visit | Attending: Surgery | Admitting: Surgery

## 2012-04-20 ENCOUNTER — Encounter (HOSPITAL_COMMUNITY)
Admission: RE | Disposition: A | Discharge status: Home / Self Care | Payer: Self-pay | Source: Ambulatory Visit | Attending: Surgery

## 2012-04-20 ENCOUNTER — Telehealth: Payer: Self-pay | Admitting: Surgery

## 2012-04-20 DIAGNOSIS — R51 Headache: Secondary | ICD-10-CM | POA: Diagnosis not present

## 2012-04-20 DIAGNOSIS — Z8249 Family history of ischemic heart disease and other diseases of the circulatory system: Secondary | ICD-10-CM

## 2012-04-20 DIAGNOSIS — Z79899 Other long term (current) drug therapy: Secondary | ICD-10-CM

## 2012-04-20 DIAGNOSIS — F17200 Nicotine dependence, unspecified, uncomplicated: Secondary | ICD-10-CM | POA: Diagnosis present

## 2012-04-20 DIAGNOSIS — Z888 Allergy status to other drugs, medicaments and biological substances status: Secondary | ICD-10-CM

## 2012-04-20 DIAGNOSIS — Z860100 Personal history of colon polyps, unspecified: Secondary | ICD-10-CM

## 2012-04-20 DIAGNOSIS — I6529 Occlusion and stenosis of unspecified carotid artery: Secondary | ICD-10-CM

## 2012-04-20 DIAGNOSIS — I739 Peripheral vascular disease, unspecified: Secondary | ICD-10-CM | POA: Diagnosis present

## 2012-04-20 DIAGNOSIS — F410 Panic disorder [episodic paroxysmal anxiety] without agoraphobia: Secondary | ICD-10-CM | POA: Diagnosis present

## 2012-04-20 DIAGNOSIS — E785 Hyperlipidemia, unspecified: Secondary | ICD-10-CM | POA: Diagnosis present

## 2012-04-20 DIAGNOSIS — IMO0002 Reserved for concepts with insufficient information to code with codable children: Secondary | ICD-10-CM | POA: Diagnosis present

## 2012-04-20 DIAGNOSIS — Z91040 Latex allergy status: Secondary | ICD-10-CM

## 2012-04-20 DIAGNOSIS — Z7902 Long term (current) use of antithrombotics/antiplatelets: Secondary | ICD-10-CM

## 2012-04-20 DIAGNOSIS — Z7982 Long term (current) use of aspirin: Secondary | ICD-10-CM

## 2012-04-20 DIAGNOSIS — I10 Essential (primary) hypertension: Secondary | ICD-10-CM | POA: Diagnosis present

## 2012-04-20 DIAGNOSIS — M069 Rheumatoid arthritis, unspecified: Secondary | ICD-10-CM | POA: Diagnosis present

## 2012-04-20 DIAGNOSIS — I2510 Atherosclerotic heart disease of native coronary artery without angina pectoris: Secondary | ICD-10-CM | POA: Diagnosis present

## 2012-04-20 HISTORY — PX: ENDARTERECTOMY: SHX5162

## 2012-04-20 HISTORY — PX: ANGIOPLASTY: SHX39

## 2012-04-20 SURGERY — ENDARTERECTOMY, CAROTID
Anesthesia: General | Site: Neck | Laterality: Right | Wound class: Clean

## 2012-04-20 MED ORDER — OXYCODONE HCL 5 MG PO TABS: 5.0000 mg | ORAL_TABLET | Freq: Once | ORAL | Status: DC | PRN

## 2012-04-20 MED ORDER — HEPARIN SODIUM (PORCINE) 1000 UNIT/ML IJ SOLN
INTRAMUSCULAR | Status: DC | PRN
  Administered 2012-04-20: 2 mL via INTRAVENOUS
  Administered 2012-04-20: 7 mL via INTRAVENOUS

## 2012-04-20 MED ORDER — DEXTROSE 5 % IV SOLN
1.5000 g | Freq: Two times a day (BID) | INTRAVENOUS | Status: AC
  Administered 2012-04-20 – 2012-04-21 (×2): 1.5 g via INTRAVENOUS
  Filled 2012-04-20 (×2): qty 1.5

## 2012-04-20 MED ORDER — GUAIFENESIN-DM 100-10 MG/5ML PO SYRP: 15.0000 mL | ORAL_SOLUTION | ORAL | Status: DC | PRN

## 2012-04-20 MED ORDER — LABETALOL HCL 5 MG/ML IV SOLN
10.0000 mg | INTRAVENOUS | Status: DC | PRN
  Administered 2012-04-21 (×2): 10 mg via INTRAVENOUS
  Filled 2012-04-20 (×2): qty 4

## 2012-04-20 MED ORDER — BISACODYL 10 MG RE SUPP: 10.0000 mg | Freq: Every day | RECTAL | Status: DC | PRN

## 2012-04-20 MED ORDER — LOSARTAN POTASSIUM 50 MG PO TABS: 100.0000 mg | ORAL_TABLET | Freq: Every day | ORAL | Status: DC

## 2012-04-20 MED ORDER — POTASSIUM CHLORIDE CRYS ER 20 MEQ PO TBCR: 20.0000 meq | EXTENDED_RELEASE_TABLET | Freq: Once | ORAL | Status: AC | PRN

## 2012-04-20 MED ORDER — ESMOLOL HCL 10 MG/ML IV SOLN
INTRAVENOUS | Status: DC | PRN
  Administered 2012-04-20: 20 mg via INTRAVENOUS

## 2012-04-20 MED ORDER — ONDANSETRON HCL 4 MG/2ML IJ SOLN
INTRAMUSCULAR | Status: DC | PRN
  Administered 2012-04-20: 4 mg via INTRAVENOUS

## 2012-04-20 MED ORDER — PHENOL 1.4 % MT LIQD: 1.0000 | OROMUCOSAL | Status: DC | PRN

## 2012-04-20 MED ORDER — ASPIRIN EC 325 MG PO TBEC: 325.0000 mg | DELAYED_RELEASE_TABLET | Freq: Every day | ORAL | Status: DC

## 2012-04-20 MED ORDER — LACTATED RINGERS IV SOLN
INTRAVENOUS | Status: DC | PRN
  Administered 2012-04-20 (×2): via INTRAVENOUS

## 2012-04-20 MED ORDER — ARTIFICIAL TEARS OP OINT
TOPICAL_OINTMENT | OPHTHALMIC | Status: DC | PRN
  Administered 2012-04-20: 1 via OPHTHALMIC

## 2012-04-20 MED ORDER — MORPHINE SULFATE 2 MG/ML IJ SOLN
2.0000 mg | INTRAMUSCULAR | Status: DC | PRN
  Administered 2012-04-20 – 2012-04-21 (×2): 2 mg via INTRAVENOUS
  Filled 2012-04-20 (×2): qty 1

## 2012-04-20 MED ORDER — MORPHINE SULFATE 4 MG/ML IJ SOLN
INTRAMUSCULAR | Status: AC
  Administered 2012-04-20 (×2): 2 mg
  Filled 2012-04-20: qty 1

## 2012-04-20 MED ORDER — HEMOSTATIC AGENTS (NO CHARGE) OPTIME
TOPICAL | Status: DC | PRN
  Administered 2012-04-20: 1 via TOPICAL

## 2012-04-20 MED ORDER — LOSARTAN POTASSIUM 50 MG PO TABS
100.0000 mg | ORAL_TABLET | Freq: Every day | ORAL | Status: DC
  Administered 2012-04-20 – 2012-04-22 (×3): 100 mg via ORAL
  Filled 2012-04-20 (×3): qty 2

## 2012-04-20 MED ORDER — ACETAMINOPHEN 325 MG PO TABS: 325.0000 mg | ORAL_TABLET | ORAL | Status: DC | PRN

## 2012-04-20 MED ORDER — NEOSTIGMINE METHYLSULFATE 1 MG/ML IJ SOLN
INTRAMUSCULAR | Status: DC | PRN
  Administered 2012-04-20: 3 mg via INTRAVENOUS

## 2012-04-20 MED ORDER — SODIUM CHLORIDE 0.9 % IR SOLN
Status: DC | PRN
  Administered 2012-04-20: 08:00:00

## 2012-04-20 MED ORDER — MAGNESIUM SULFATE 40 MG/ML IJ SOLN
2.0000 g | Freq: Once | INTRAMUSCULAR | Status: AC | PRN
  Filled 2012-04-20: qty 50

## 2012-04-20 MED ORDER — HYDRALAZINE HCL 20 MG/ML IJ SOLN
10.0000 mg | INTRAMUSCULAR | Status: DC | PRN
  Administered 2012-04-21: 10 mg via INTRAVENOUS
  Filled 2012-04-20: qty 1

## 2012-04-20 MED ORDER — ATORVASTATIN CALCIUM 40 MG PO TABS
40.0000 mg | ORAL_TABLET | Freq: Every day | ORAL | Status: DC
  Administered 2012-04-20 – 2012-04-22 (×3): 40 mg via ORAL
  Filled 2012-04-20 (×3): qty 1

## 2012-04-20 MED ORDER — SODIUM CHLORIDE 0.9 % IV SOLN: 500.0000 mL | Freq: Once | INTRAVENOUS | Status: AC | PRN

## 2012-04-20 MED ORDER — LIDOCAINE HCL (PF) 1 % IJ SOLN
INTRAMUSCULAR | Status: AC
  Filled 2012-04-20: qty 30

## 2012-04-20 MED ORDER — DOCUSATE SODIUM 100 MG PO CAPS
100.0000 mg | ORAL_CAPSULE | Freq: Every day | ORAL | Status: DC
  Administered 2012-04-21: 100 mg via ORAL
  Filled 2012-04-20: qty 1

## 2012-04-20 MED ORDER — ALUM & MAG HYDROXIDE-SIMETH 200-200-20 MG/5ML PO SUSP: 15.0000 mL | ORAL | Status: DC | PRN

## 2012-04-20 MED ORDER — ONDANSETRON HCL 4 MG/2ML IJ SOLN: 4.0000 mg | Freq: Four times a day (QID) | INTRAMUSCULAR | Status: DC | PRN

## 2012-04-20 MED ORDER — FENTANYL CITRATE 0.05 MG/ML IJ SOLN
INTRAMUSCULAR | Status: DC | PRN
  Administered 2012-04-20: 25 ug via INTRAVENOUS
  Administered 2012-04-20 (×2): 50 ug via INTRAVENOUS
  Administered 2012-04-20: 100 ug via INTRAVENOUS
  Administered 2012-04-20: 25 ug via INTRAVENOUS

## 2012-04-20 MED ORDER — OXYCODONE HCL 5 MG PO TABS: 5.0000 mg | ORAL_TABLET | ORAL | Status: DC | PRN

## 2012-04-20 MED ORDER — ASPIRIN EC 325 MG PO TBEC
325.0000 mg | DELAYED_RELEASE_TABLET | Freq: Every day | ORAL | Status: DC
  Administered 2012-04-21 – 2012-04-22 (×2): 325 mg via ORAL
  Filled 2012-04-20 (×2): qty 1

## 2012-04-20 MED ORDER — GLYCOPYRROLATE 0.2 MG/ML IJ SOLN
INTRAMUSCULAR | Status: DC | PRN
  Administered 2012-04-20: 0.4 mg via INTRAVENOUS

## 2012-04-20 MED ORDER — MORPHINE SULFATE 4 MG/ML IJ SOLN
INTRAMUSCULAR | Status: AC
  Administered 2012-04-20: 2 mg
  Filled 2012-04-20: qty 1

## 2012-04-20 MED ORDER — OXYCODONE HCL 5 MG/5ML PO SOLN
5.0000 mg | Freq: Once | ORAL | Status: DC | PRN
Start: 2012-04-20 — End: 2012-04-20

## 2012-04-20 MED ORDER — CLOPIDOGREL BISULFATE 75 MG PO TABS
75.0000 mg | ORAL_TABLET | Freq: Every day | ORAL | Status: DC
  Administered 2012-04-21 – 2012-04-22 (×2): 75 mg via ORAL
  Filled 2012-04-20 (×2): qty 1

## 2012-04-20 MED ORDER — SENNOSIDES-DOCUSATE SODIUM 8.6-50 MG PO TABS
1.0000 | ORAL_TABLET | Freq: Every evening | ORAL | Status: DC | PRN
  Filled 2012-04-20: qty 1

## 2012-04-20 MED ORDER — SODIUM CHLORIDE 0.9 % IV SOLN
10.0000 mg | INTRAVENOUS | Status: DC | PRN
  Administered 2012-04-20: 20 ug/min via INTRAVENOUS

## 2012-04-20 MED ORDER — ROCURONIUM BROMIDE 100 MG/10ML IV SOLN
INTRAVENOUS | Status: DC | PRN
  Administered 2012-04-20 (×2): 10 mg via INTRAVENOUS
  Administered 2012-04-20: 50 mg via INTRAVENOUS

## 2012-04-20 MED ORDER — 0.9 % SODIUM CHLORIDE (POUR BTL) OPTIME
TOPICAL | Status: DC | PRN
  Administered 2012-04-20: 3000 mL

## 2012-04-20 MED ORDER — PROPOFOL 10 MG/ML IV BOLUS
INTRAVENOUS | Status: DC | PRN
  Administered 2012-04-20: 140 mg via INTRAVENOUS

## 2012-04-20 MED ORDER — PROTAMINE SULFATE 10 MG/ML IV SOLN
INTRAVENOUS | Status: DC | PRN
  Administered 2012-04-20 (×5): 10 mg via INTRAVENOUS

## 2012-04-20 MED ORDER — MORPHINE SULFATE 2 MG/ML IJ SOLN
1.0000 mg | INTRAMUSCULAR | Status: DC | PRN
  Administered 2012-04-20: 2 mg via INTRAVENOUS

## 2012-04-20 MED ORDER — ACETAMINOPHEN 650 MG RE SUPP: 325.0000 mg | RECTAL | Status: DC | PRN

## 2012-04-20 MED ORDER — METOPROLOL TARTRATE 1 MG/ML IV SOLN: 2.0000 mg | INTRAVENOUS | Status: DC | PRN

## 2012-04-20 MED ORDER — METOCLOPRAMIDE HCL 5 MG/ML IJ SOLN: 10.0000 mg | Freq: Once | INTRAMUSCULAR | Status: DC | PRN

## 2012-04-20 MED ORDER — LIDOCAINE HCL (CARDIAC) 20 MG/ML IV SOLN
INTRAVENOUS | Status: DC | PRN
  Administered 2012-04-20: 100 mg via INTRAVENOUS

## 2012-04-20 MED ORDER — PANTOPRAZOLE SODIUM 40 MG PO TBEC
40.0000 mg | DELAYED_RELEASE_TABLET | Freq: Every day | ORAL | Status: DC
  Administered 2012-04-20 – 2012-04-22 (×3): 40 mg via ORAL
  Filled 2012-04-20 (×3): qty 1

## 2012-04-20 MED ORDER — SODIUM CHLORIDE 0.9 % IV SOLN
INTRAVENOUS | Status: DC
  Administered 2012-04-20: 125 mL/h via INTRAVENOUS
  Administered 2012-04-21: 08:00:00 via INTRAVENOUS

## 2012-04-20 MED ORDER — DOPAMINE-DEXTROSE 3.2-5 MG/ML-% IV SOLN: 3.0000 ug/kg/min | INTRAVENOUS | Status: DC

## 2012-04-20 SURGICAL SUPPLY — 53 items
ADH SKN CLS APL DERMABOND .7 (GAUZE/BANDAGES/DRESSINGS) ×1
CANISTER SUCTION 2500CC (MISCELLANEOUS) ×2 IMPLANT
CATH SUCT 10FR WHISTLE TIP (CATHETERS) ×2 IMPLANT
CLIP TI MEDIUM 24 (CLIP) ×2 IMPLANT
CLIP TI WIDE RED SMALL 24 (CLIP) ×2 IMPLANT
CLOTH BEACON ORANGE TIMEOUT ST (SAFETY) ×2 IMPLANT
COVER SURGICAL LIGHT HANDLE (MISCELLANEOUS) ×2 IMPLANT
CRADLE DONUT ADULT HEAD (MISCELLANEOUS) ×2 IMPLANT
DERMABOND ADVANCED (GAUZE/BANDAGES/DRESSINGS) ×1
DERMABOND ADVANCED .7 DNX12 (GAUZE/BANDAGES/DRESSINGS) ×1 IMPLANT
DRAIN CHANNEL 15F RND FF W/TCR (WOUND CARE) IMPLANT
DRAPE WARM FLUID 44X44 (DRAPE) ×2 IMPLANT
ELECT REM PT RETURN 9FT ADLT (ELECTROSURGICAL) ×2
ELECTRODE REM PT RTRN 9FT ADLT (ELECTROSURGICAL) ×1 IMPLANT
EVACUATOR SILICONE 100CC (DRAIN) IMPLANT
GLOVE BIOGEL PI IND STRL 6.5 (GLOVE) IMPLANT
GLOVE BIOGEL PI IND STRL 7.5 (GLOVE) ×1 IMPLANT
GLOVE BIOGEL PI IND STRL 8 (GLOVE) IMPLANT
GLOVE BIOGEL PI INDICATOR 6.5 (GLOVE) ×2
GLOVE BIOGEL PI INDICATOR 7.5 (GLOVE) ×1
GLOVE BIOGEL PI INDICATOR 8 (GLOVE) ×1
GLOVE SURG SS PI 6.5 STRL IVOR (GLOVE) ×2 IMPLANT
GLOVE SURG SS PI 7.0 STRL IVOR (GLOVE) ×1 IMPLANT
GLOVE SURG SS PI 7.5 STRL IVOR (GLOVE) ×2 IMPLANT
GOWN PREVENTION PLUS XXLARGE (GOWN DISPOSABLE) ×2 IMPLANT
GOWN STRL NON-REIN LRG LVL3 (GOWN DISPOSABLE) ×6 IMPLANT
HEMOSTAT SNOW SURGICEL 2X4 (HEMOSTASIS) ×1 IMPLANT
HEMOSTAT SURGICEL 2X14 (HEMOSTASIS) IMPLANT
INSERT FOGARTY SM (MISCELLANEOUS) IMPLANT
KIT BASIN OR (CUSTOM PROCEDURE TRAY) ×2 IMPLANT
KIT ROOM TURNOVER OR (KITS) ×2 IMPLANT
KIT SUCTION CATH 14FR (SUCTIONS) ×2 IMPLANT
NS IRRIG 1000ML POUR BTL (IV SOLUTION) ×5 IMPLANT
PACK CAROTID (CUSTOM PROCEDURE TRAY) ×2 IMPLANT
PAD ARMBOARD 7.5X6 YLW CONV (MISCELLANEOUS) ×4 IMPLANT
PATCH VASCULAR VASCU GUARD 1X6 (Vascular Products) ×1 IMPLANT
SHUNT CAROTID BYPASS 10 (VASCULAR PRODUCTS) IMPLANT
SHUNT CAROTID BYPASS 12FRX15.5 (VASCULAR PRODUCTS) IMPLANT
SPECIMEN JAR SMALL (MISCELLANEOUS) ×2 IMPLANT
SPONGE INTESTINAL PEANUT (DISPOSABLE) ×2 IMPLANT
SUT ETHILON 3 0 PS 1 (SUTURE) IMPLANT
SUT PROLENE 6 0 BV (SUTURE) ×4 IMPLANT
SUT PROLENE 7 0 BV 1 (SUTURE) IMPLANT
SUT SILK 3 0 (SUTURE) ×2
SUT SILK 3 0 TIES 17X18 (SUTURE)
SUT SILK 3-0 18XBRD TIE 12 (SUTURE) IMPLANT
SUT SILK 3-0 18XBRD TIE BLK (SUTURE) IMPLANT
SUT VIC AB 3-0 SH 27 (SUTURE) ×4
SUT VIC AB 3-0 SH 27X BRD (SUTURE) ×2 IMPLANT
SUT VICRYL 4-0 PS2 18IN ABS (SUTURE) ×2 IMPLANT
TOWEL OR 17X24 6PK STRL BLUE (TOWEL DISPOSABLE) ×3 IMPLANT
TOWEL OR 17X26 10 PK STRL BLUE (TOWEL DISPOSABLE) ×2 IMPLANT
WATER STERILE IRR 1000ML POUR (IV SOLUTION) ×2 IMPLANT

## 2012-04-20 NOTE — H&P (Signed)
[facility] [place]  Patient name: Leslie [name] MRN: [id number] DOB: December 19, 1945 Sex: female  Referred by: Dr. Allyson Sabal  Reason for referral:  Chief Complaint   Patient presents with   .  New Evaluation     carotid stenosis/Dr. Allyson Sabal    HISTORY OF PRESENT ILLNESS:  This is a very pleasant female referred for evaluation of carotid stenosis. She is been followed for many years with serial carotid ultrasound. This recently showed progression of disease. She underwent arteriogram which revealed 80% right carotid stenosis. She was referred for surgical evaluation. She has a history of peripheral vascular disease having undergone stenting in bilateral superficial femoral arteries. Regarding her carotid artery occlusive disease, she remains asymptomatic. Specifically she denies numbness or weakness in either extremity. She denies slurred speech. She denies ever as 2 tacks. She is medically managed for hypertension and hyperlipidemia. She continues to have problems with claudication. She is on double antiplatelet therapy with aspirin and Plavix. She is a current smoker  Past Medical History   Diagnosis  Date   .  PAD (peripheral artery disease)    .  HTN (hypertension), benign    .  Tobacco abuse    .  Rheumatoid arthritis    .  Carotid artery occlusion    .  CAD (coronary artery disease)    .  Hyperlipidemia     Past Surgical History   Procedure  Date   .  Back surgery    .  Leg stents    .  Total knee arthroplasty     History    Social History   .  Marital Status:  Divorced     Spouse Name:  N/A     Number of Children:  N/A   .  Years of Education:  N/A    Occupational History   .  Not on file.    Social History Main Topics   .  Smoking status:  Current Some Day Smoker -- 0.5 packs/day for 50 years     Types:  Cigarettes   .  Smokeless tobacco:  Never Used      Comment: pt states that she has had 3 cigs in the last 8 weeks   .  Alcohol Use:  No   .  Drug Use:   No   .  Sexually Active:  Not on file    Other Topics  Concern   .  Not on file    Social History Narrative   .  No narrative on file    Family History   Problem  Relation  Age of Onset   .  Hyperlipidemia  Mother    .  Hypertension  Mother    .  Cancer  Father    .  Diabetes  Sister    .  Hypertension  Sister    .  Diabetes  Brother    .  Heart disease  Brother    .  Hypertension  Brother    .  Heart attack  Brother     Allergies as of 03/12/2012 - Review Complete 03/12/2012   Allergen  Reaction  Noted   .  Latex  Other (See Comments)  10/03/2011   .  Tape  Other (See Comments)  10/03/2011   .  Lipitor (atorvastatin)  Other (See Comments)  10/04/2011    Current Outpatient Prescriptions on File Prior to Visit   Medication  Sig  Dispense  Refill   .  acetaminophen (TYLENOL) 500 MG tablet  Take 500 mg by mouth every 6 (six) hours as needed. For pain     .  aspirin EC 325 MG tablet  Take 325 mg by mouth daily.     Marland Kitchen  atorvastatin (LIPITOR) 20 MG tablet  Take 40 mg by mouth daily.     .  clopidogrel (PLAVIX) 75 MG tablet  Take 75 mg by mouth daily.     Marland Kitchen  losartan (COZAAR) 100 MG tablet  Take 100 mg by mouth daily.     Marland Kitchen  lisinopril (PRINIVIL,ZESTRIL) 20 MG tablet  Take 1 tablet (20 mg total) by mouth daily.  30 tablet  11    REVIEW OF SYSTEMS:  Cardiovascular: No chest pain, chest pressure, palpitations, orthopnea, or dyspnea on exertion. No history of DVT or phlebitis.  Pulmonary: No productive cough, asthma or wheezing.  Neurologic: No weakness, paresthesias, aphasia, or amaurosis. No dizziness.  Hematologic: No bleeding problems or clotting disorders.  Musculoskeletal: No joint pain or joint swelling.  Gastrointestinal: No blood in stool or hematemesis  Genitourinary: No dysuria or hematuria.  Psychiatric:: No history of major depression.  Integumentary: No rashes or ulcers.  Constitutional: No fever or chills.  PHYSICAL EXAMINATION:  General: The patient appears their  stated age. Vital signs are BP 153/90  Pulse 86  Ht 5' 7.5" (1.715 m)  Wt 177 lb (80.287 kg)  BMI 27.31 kg/m2  SpO2 100%  HEENT: No gross abnormalities  Pulmonary: Respirations are non-labored  Abdomen: Soft and non-tender  Musculoskeletal: There are no major deformities.  Neurologic: No focal weakness or paresthesias are detected,  Skin: There are no ulcer or rashes noted.  Psychiatric: The patient has normal affect.  Cardiovascular: There is a regular rate and rhythm without significant murmur appreciated. No carotid bruit  Diagnostic Studies:  I have reviewed her angiogram. There is a high-grade right carotid stenosis with approximately 30% left carotid stenosis  Outside Studies/Documentation  Historical records were reviewed. They showed asymptomatic high-grade right carotid stenosis  Medication Changes:  None  Assessment:  Asymptomatic right carotid stenosis  Plan:  From a cardiac perspective the patient would be low risk given her nonischemic Myoview and normal LV function by 2-D echo. Having reviewed her carotid angiogram however, I am somewhat concerned that her lesion may be difficult to reach surgically. Unfortunately, I cannot window properly the images and so I will need to review them on the Storz machine. Therefore, I cannot make a definitive decision regarding correction of her carotid stenosis. I have described in detail the carotid endarterectomy procedure including the risks and benefits of nerve injury and stroke. We also discussed the risk for carotid stenting. I told the patient I would contact her once I have reviewed her images and have come up with a definitive decision.    After reviewing her angio again with Dr. Allyson Sabal, we fel that her lesion is too long for carotid stenting.  It is on the high side, so I discussed the significance of this with her this am, including the risk of swallowing trouble post op.  She wishes to proceed. Jorge Ny, M.D.   [facility] [place]  Office: [phone]  Pager: [phone]

## 2012-04-20 NOTE — Telephone Encounter (Addendum)
Message copied by Rosalyn Charters on Fri Apr 20, 2012  3:13 PM ------      Message from: Melene Plan      Created: Fri Apr 20, 2012  1:56 PM                   ----- Message -----         From: Lars Mage, PA         Sent: 04/20/2012   1:07 PM           To: Melene Plan, RN            Right carotid Dr. Myra Gianotti f/u in 2 weeks  l/v/m notifying patient of fu appt. with dr. Myra Gianotti on 05-14-12 at 3 pm

## 2012-04-20 NOTE — Progress Notes (Signed)
PHARMACIST - PHYSICIAN COMMUNICATION  CONCERNING:  Renal dose ABX adjustments  DESCRIPTION: Pt. Receiving Cefuroxime 1.5 gm every 12 hours x 2 doses.  Current renal function is stable with an estimated clearance > 54ml/min.   RECOMMENDATION: Continue current regimen.  Nadara Mustard, PharmD., MS Clinical Pharmacist Pager:  [phone] Thank you for allowing pharmacy to be part of this patients care team.

## 2012-04-20 NOTE — Progress Notes (Signed)
Patient arrived from PACU, all vital signs WNL, alert and oriented, right CEA soft, OTA with dermabond, patient tongue diviates slightly to the right, per PACU RN, Nita Sells, Dr. Myra Gianotti aware and has been to bedside in PACU .Admission vitals, 158/80, 91, 94% on 2 L, 24, 98.1. Will continue to monitor patient.

## 2012-04-20 NOTE — Anesthesia Preprocedure Evaluation (Addendum)
Anesthesia Evaluation  Patient identified by MRN, date of birth, ID band Patient awake    Reviewed: Allergy & Precautions, H&P , NPO status , Patient's Chart, lab work & pertinent test results, reviewed documented beta blocker date and time   Airway Mallampati: II TM Distance: >3 FB Neck ROM: full    Dental  (+) Edentulous Upper, Edentulous Lower and Dental Advisory Given   Pulmonary pneumonia -, resolved, Current Smoker,  breath sounds clear to auscultation        Cardiovascular hypertension, On Medications + CAD and + Peripheral Vascular Disease Rhythm:regular     Neuro/Psych negative neurological ROS  negative psych ROS   GI/Hepatic negative GI ROS, Neg liver ROS,   Endo/Other  negative endocrine ROS  Renal/GU Renal disease  negative genitourinary   Musculoskeletal   Abdominal   Peds  Hematology negative hematology ROS (+)   Anesthesia Other Findings See surgeon's H&P   Reproductive/Obstetrics negative OB ROS                          Anesthesia Physical Anesthesia Plan  ASA: III  Anesthesia Plan: General   Post-op Pain Management:    Induction: Intravenous  Airway Management Planned: Oral ETT  Additional Equipment: Arterial line  Intra-op Plan:   Post-operative Plan: Extubation in OR  Informed Consent: I have reviewed the patients History and Physical, chart, labs and discussed the procedure including the risks, benefits and alternatives for the proposed anesthesia with the patient or authorized representative who has indicated his/her understanding and acceptance.   Dental Advisory Given  Plan Discussed with: CRNA and Surgeon  Anesthesia Plan Comments:         Anesthesia Quick Evaluation

## 2012-04-20 NOTE — Progress Notes (Signed)
Patient is now s/p R CEA for asymptomatic stenosis earlier today.  She had extensive disease from her clavicle up to her skull base which was completely removed.  This required full mobilization of her hypoglossal nerve.  On examination, her incision is c/d/i She has equal motor strength bilaterally Smile is symmetric Her tongue deviates slightly to the right which is not unexpected due to the dissection required.  I discussed this with the patient and told her that this is NOT a stroke, and that it will most likely resolve with time. She has been able to eat jello and drink liquid without swallowing difficulties  She will most likely be able to go home tomorrow. Family at bedside and verbalized understanding  Durene Cal

## 2012-04-20 NOTE — Transfer of Care (Signed)
Immediate Anesthesia Transfer of Care Note  Patient: Leslie [name]  Procedure(s) Performed: Procedure(s) (LRB) with comments: ENDARTERECTOMY CAROTID (Right) ANGIOPLASTY (Right) - Using 1cm x6 cm vascu-guard patch   Patient Location: PACU  Anesthesia Type:General  Level of Consciousness: awake, alert  and oriented  Airway & Oxygen Therapy: Patient Spontanous Breathing and Patient connected to nasal cannula oxygen  Post-op Assessment: Report given to PACU RN and Patient moving all extremities X 4  Post vital signs: Reviewed  Complications: No apparent anesthesia complications

## 2012-04-20 NOTE — Anesthesia Postprocedure Evaluation (Signed)
Anesthesia Post Note  Patient: Leslie [name]  Procedure(s) Performed: Procedure(s) (LRB): ENDARTERECTOMY CAROTID (Right) ANGIOPLASTY (Right)  Anesthesia type: general  Patient location: PACU  Post pain: Pain level controlled  Post assessment: Patient's Cardiovascular Status Stable  Last Vitals:  Filed Vitals:   04/20/12 1215  BP:   Pulse: 81  Temp:   Resp: 18    Post vital signs: Reviewed and stable  Level of consciousness: sedated  Complications: No apparent anesthesia complications

## 2012-04-20 NOTE — Progress Notes (Signed)
Utilization review completed.  

## 2012-04-20 NOTE — Preoperative (Signed)
Beta Blockers   Reason not to administer Beta Blockers:Not Applicable 

## 2012-04-20 NOTE — Op Note (Signed)
[facility] [place]  Patient name: Leslie [name] MRN: [id number] DOB: 11-21-1945 Sex: female  04/20/2012 Pre-operative Diagnosis: Asymptomatic   right carotid stenosis Post-operative diagnosis:  Same Surgeon:  Jorge Ny Assistants:  Narda Amber Procedure:    right carotid Endarterectomy with bovine pericardial patch angioplasty Anesthesia:  General Blood Loss:  See anesthesia record Specimens:  Carotid Plaque to pathology  Findings:  Diffuse long segment 85 %stenosis; Thrombus: None   Indications:  This is a 67 year old female with known carotid occlusive disease. Angiography revealed a long segment diffuse stenosis within the right carotid artery. She was felt to be a poor candidate for stenting and therefore she comes in for operation.  Procedure:  The patient was identified in the holding area and taken to [facility] OR ROOM 11  The patient was then placed supine on the table.   General endotrachial anesthesia was administered.  The patient was prepped and draped in the usual sterile fashion.  A time out was called and antibiotics were administered.  The incision was made along the anterior border of the right sternocleidomastoid muscle.  Cautery was used to dissect through the subcutaneous tissue.  The platysma muscle was divided with cautery.  The internal jugular vein was exposed along its anterior medial border.  The common facial vein was exposed and then divided between 2-0 silk ties and metal clips.  The common carotid artery was then circumferentially exposed and encircled with an umbilical tape.  The vagus nerve was identified and protected.  It was on the lateral posterior side of the common carotid artery Next sharp dissection was used to expose the external carotid artery and the superior thyroid artery.  The were encircled with a blue vessel loop and a 2-0 silk tie respectively.  Finally, the internal carotid was carefully dissected free. I had to use a  retractor under the posterior belly of the digastric muscle in order to dissect out the distal internal carotid artery. This required gentle manipulation of the hypoglossal nerve. The artery was obviously diseased all the way up to just short of the skull base. I dissected the artery all the way out up to this point. Once exposure was satisfactory, the patient was fully heparinized. After the heparin circulated, the internal, followed by the external and common carotid arteries were occluded. A #11 blade was used to make an arteriotomy which was extended longitudinally along the anterior surface of the common and internal carotid artery. The disease within the internal carotid artery went all the way up to the base of the clamp which was as high as I could position it. The disease within the common carotid artery also went down to under the clavicle. The patient did have good backbleeding from her internal carotid artery. Because of the proximal extent of the disease, I was unable to place a shunt. An endarterectomy was performed using a Ladoris Gene. Fortunately, I was able to remove all the plaque and get a good distal endpoint. The endarterectomized head was copiously irrigated and all potential embolic debris was removed. I did remove also the plaque down to just under the clavicle. I primarily closed approximately 4 cm of the common carotid artery with a running 6-0 Prolene. I then proceeded with patch angioplasty using a bovine pericardial patch on the internal carotid and common carotid artery. Prior completion the appropriate flushing maneuvers were performed. The artery was again flushed, and then the anastomosis was completed. Sequential declamping was performed,  first with the external followed by the common carotid artery clamp. Approximately 30 seconds later the internal carotid artery was released. The patient did have a significant increase in her blood pressure with declamping. This was  easily controlled with medication. The heparin was then reversed with protamine. Once hemostasis was satisfactory, the carotid sheath was reapproximated with 2-0 Vicryl, the platysma muscle was reapproximated with 3-0 Vicryl, and the skin was closed with running 4-0 Vicryl.  Disposition:  To PACU in stable condition.  Relevant Operative Details:   Lesion extended up to the base of the skull and down to the clavicle. A shunt was not placed because of the extent of the disease. I was able to remove all the plaque. The hypoglossal nerve had to be manipulated in order to fully visualize all of the distal internal carotid artery. The vagus nerve was on the posterior lateral side of the common carotid artery. I closed the common carotid artery primarily for approximately 4 cm. A bovine pericardial patch was placed on the internal and common carotid artery.   Juleen China, M.D. [facility] [place] Office: [phone] Pager:  [phone]

## 2012-04-21 LAB — CBC
HCT: 29.6 % — ABNORMAL LOW (ref 36.0–46.0)
MCH: 26.5 pg (ref 26.0–34.0)
MCV: 84.3 fL (ref 78.0–100.0)
RBC: 3.51 MIL/uL — ABNORMAL LOW (ref 3.87–5.11)
WBC: 10.7 10*3/uL — ABNORMAL HIGH (ref 4.0–10.5)

## 2012-04-21 LAB — BASIC METABOLIC PANEL
BUN: 7 mg/dL (ref 6–23)
CO2: 26 mEq/L (ref 19–32)
Chloride: 104 mEq/L (ref 96–112)
Creatinine, Ser: 0.77 mg/dL (ref 0.50–1.10)
Glucose, Bld: 117 mg/dL — ABNORMAL HIGH (ref 70–99)

## 2012-04-21 MED ORDER — DOPAMINE-DEXTROSE 3.2-5 MG/ML-% IV SOLN: 3.0000 ug/kg/min | INTRAVENOUS | Status: DC | PRN

## 2012-04-21 NOTE — Progress Notes (Addendum)
VASCULAR AND VEIN SURGERY POST - OP CEA PROGRESS NOTE  Date of Surgery: 04/20/2012  Surgeon(s): Nada Libman, MD 1 Day Post-Op right Carotid Endarterectomy .  HPI: Leslie [name] is a 67 y.o. female who is 1 Day Post-Op right Carotid Endarterectomy . Patient is doing well.  Patient reports mild headache which has resolved Patient denies difficulty swallowing; denies weakness in upper or lower extremities; Pt. denies other symptoms of stroke or TIA.  IMAGING: No results found.  Significant Diagnostic Studies: CBC Lab Results  Component Value Date   WBC 10.7* 04/21/2012   HGB 9.3* 04/21/2012   HCT 29.6* 04/21/2012   MCV 84.3 04/21/2012   PLT 242 04/21/2012    BMET    Component Value Date/Time   NA 137 04/21/2012 0400   K 4.0 04/21/2012 0400   CL 104 04/21/2012 0400   CO2 26 04/21/2012 0400   GLUCOSE 117* 04/21/2012 0400   BUN 7 04/21/2012 0400   CREATININE 0.77 04/21/2012 0400   CALCIUM 8.2* 04/21/2012 0400   GFRNONAA 86* 04/21/2012 0400   GFRAA >90 04/21/2012 0400    COAG Lab Results  Component Value Date   INR 0.97 04/16/2012   No results found for this basename: PTT      Intake/Output Summary (Last 24 hours) at 04/21/12 0805 Last data filed at 04/21/12 0600  Gross per 24 hour  Intake   3570 ml  Output   1400 ml  Net   2170 ml    Physical Exam:  BP Readings from Last 3 Encounters:  04/21/12 147/74  04/21/12 147/74  04/16/12 145/72   Temp Readings from Last 3 Encounters:  04/21/12 98.4 F (36.9 C)   04/21/12 98.4 F (36.9 C)   04/16/12 98 F (36.7 C)    SpO2 Readings from Last 3 Encounters:  04/21/12 98%  04/21/12 98%  04/16/12 94%   Pulse Readings from Last 3 Encounters:  04/21/12 96  04/21/12 96  04/16/12 91    Pt is A&O x 3 Gait is normal Speech is fluent right Neck Wound is healing well Patient with Positive mild tongue deviation and Negative facial droop Pt has good and equal strength in all extremities  Assessment/Plan:: Leslie [name] is a 67 y.o.  female is S/P Right Carotid endarterectomy Pt is voiding, ambulating and taking po well Pt denies Chest pain/SOB Mild reperfusion H/A - resolved    Discharge to: Home Follow-up in 2 weeks   ROCZNIAK,REGINA J [phone] 04/21/2012 8:05 AM   I have examined the patient, reviewed and agree with above. Up in chair. Grossly intact neurologically. He is having mild difficulty with swallowing. Her right neck has typical swelling following surgery on postop day one with no evidence of tracheal shift or hematoma. She is having a saturations dropping below 90 off supplemental oxygen. I discussed this with the patient would feel more comfortable watching her for one additional day in the hospital. She will wean oxygen as tolerated.  Jaylin Roundy, MD 04/21/2012 11:27 AM

## 2012-04-21 NOTE — Progress Notes (Addendum)
During 0000 neuro assessment, tautness and swelling noted around incision site. Firmness had grown past previously marked area around incision from 2000 assessment. Patient's vital signs stable. No tracheal deviation noted. Patient does not complain of any discomfort besides tightness of skin around incision. Dr. Arbie Cookey notified and made aware. No new orders received. Will continue to monitor.   Kathlene Cote A RN

## 2012-04-21 NOTE — Discharge Summary (Signed)
[facility] Discharge Summary   Patient ID:  Leslie [name] MRN: [id number] DOB/AGE: 1945-11-27 67 y.o.  Admit date: 04/20/2012 Discharge date: 04/22/12 Date of Surgery: 04/20/2012 Surgeon: Surgeon(s): Nada Libman, MD  Admission Diagnosis: right intenal carotid artery stenosis  Discharge Diagnoses:  right intenal carotid artery stenosis  Secondary Diagnoses: Past Medical History  Diagnosis Date  . PAD (peripheral artery disease)   . Tobacco abuse   . Rheumatoid arthritis   . Carotid artery occlusion   . Hyperlipidemia     takes Atorvastatin daily  . HTN (hypertension), benign     takes Losartan daily  . Cough     phlem but no odor noted  . Pneumonia     several yrs ago  . History of migraine     last one at least 8months ago  . Back pain     DDD  . Dry skin   . Bruises easily     pt attributes this to Plavix and ASA  . History of colon polyps   . Panic attack     on the 2024-01-23Granddaughter died(32months)  . Coronary artery disease     no definite diagnosis found as of 04/17/12; no ischemia, EF 48% by Myoview stress 12/29/11 (Dr. Allyson Sabal)    Procedure(s): ENDARTERECTOMY CAROTID ANGIOPLASTY  Discharged Condition: good  HPI:  Leslie [name] is a 67 y.o. female is a very pleasant female referred for evaluation of carotid stenosis. She is been followed for many years with serial carotid ultrasound. This recently showed progression of disease. She underwent arteriogram which revealed 80% right carotid stenosis. She was referred for surgical evaluation. She has a history of peripheral vascular disease having undergone stenting in bilateral superficial femoral arteries. Regarding her carotid artery occlusive disease, she remains asymptomatic. Specifically she denies numbness or weakness in either extremity. She denies slurred speech. She denies ever as 2 tacks. She is medically managed for hypertension and hyperlipidemia. She continues to have problems  with claudication. She is on double antiplatelet therapy with aspirin and Plavix. She is a current smoker. Pt is admitted for elective Right CEA    Hospital Course:  Leslie [name] is a 67 y.o. female is S/P Right Procedure(s): ENDARTERECTOMY CAROTID ANGIOPLASTY Extubated: POD # 0 Post-op wounds healing well Neuro exam intact with improving mild tongue dev. Min diff swallowing pills and good and equal strength in all extremities Pt. Ambulating, voiding and taking PO diet without difficulty. Pt pain controlled with PO pain meds. Labs as below Complications: POD#1 pt with some fullness right neck with sore throat. O2 sats in the 70's with ambulation Both these issues resolved by POD#2 with less soreness with swallowing and sat's 90% on RA which is baseline  Consults:     Significant Diagnostic Studies: CBC Lab Results  Component Value Date   WBC 10.7* 04/21/2012   HGB 9.3* 04/21/2012   HCT 29.6* 04/21/2012   MCV 84.3 04/21/2012   PLT 242 04/21/2012    BMET    Component Value Date/Time   NA 137 04/21/2012 0400   K 4.0 04/21/2012 0400   CL 104 04/21/2012 0400   CO2 26 04/21/2012 0400   GLUCOSE 117* 04/21/2012 0400   BUN 7 04/21/2012 0400   CREATININE 0.77 04/21/2012 0400   CALCIUM 8.2* 04/21/2012 0400   GFRNONAA 86* 04/21/2012 0400   GFRAA >90 04/21/2012 0400   COAG Lab Results  Component Value Date   INR 0.97 04/16/2012  Disposition:  Discharge to :Home Discharge Orders    Future Appointments: Provider: Department: Dept Phone: Center:   05/14/2012 3:00 PM [facility] [phone] [facility]   05/14/2012 4:00 PM Nada Libman, MD [facility] [phone] [facility]     Future Orders Please Complete By Expires   Resume previous diet      Driving Restrictions      Comments:   No driving for 2 weeks   Lifting restrictions      Comments:   No lifting for 6 weeks   Call MD for:  temperature >100.5      Call MD for:  redness,  tenderness, or signs of infection (pain, swelling, bleeding, redness, odor or green/yellow discharge around incision site)      Call MD for:  severe or increased pain, loss or decreased feeling  in affected limb(s)      Increase activity slowly      Comments:   Walk with assistance use walker or cane as needed      Raziya, Aveni  Home Medication Instructions [id number]   Printed on:04/21/12 0809  Medication Information                    aspirin EC 325 MG tablet Take 325 mg by mouth daily.           acetaminophen (TYLENOL) 500 MG tablet Take 1,000 mg by mouth every 6 (six) hours as needed. For pain           clopidogrel (PLAVIX) 75 MG tablet Take 75 mg by mouth daily.           losartan (COZAAR) 100 MG tablet Take 100 mg by mouth daily.           atorvastatin (LIPITOR) 40 MG tablet Take 40 mg by mouth daily.           oxyCODONE (OXY IR/ROXICODONE) 5 MG immediate release tablet Take 1 tablet (5 mg total) by mouth once as needed.            Verbal and written Discharge instructions given to the patient. Wound care per Discharge AVS Follow-up Information    Follow up with Myra Gianotti IV, Lala Lund, MD. In 2 weeks. (sent)    Contact information:   [address] [place] [address] [phone]          Signed: Marlowe Shores 04/21/2012, 8:09 AM

## 2012-04-22 NOTE — Progress Notes (Signed)
Pt discharged home per MD order. Discharge instructions were reviewed and all questions answered. Surgical site is clean, dry, and intact with no signs of infection.

## 2012-04-22 NOTE — Progress Notes (Signed)
VASCULAR AND VEIN SURGERY POST - OP CEA PROGRESS NOTE  Date of Surgery: 04/20/2012  Surgeon(s): Nada Libman, MD 2 Days Post-Op right Carotid Endarterectomy .  HPI: Leslie [name] is a 67 y.o. female who is 2 Days Post-Op right Carotid Endarterectomy . Patient is doing well. C/O some numbness superior aspect of incision as expected Patient denies headache; Patient improved swallowing; denies weakness in upper or lower extremities; Pt. denies other symptoms of stroke or TIA.  IMAGING: No results found.  Significant Diagnostic Studies: CBC Lab Results  Component Value Date   WBC 10.7* 04/21/2012   HGB 9.3* 04/21/2012   HCT 29.6* 04/21/2012   MCV 84.3 04/21/2012   PLT 242 04/21/2012    BMET    Component Value Date/Time   NA 137 04/21/2012 0400   K 4.0 04/21/2012 0400   CL 104 04/21/2012 0400   CO2 26 04/21/2012 0400   GLUCOSE 117* 04/21/2012 0400   BUN 7 04/21/2012 0400   CREATININE 0.77 04/21/2012 0400   CALCIUM 8.2* 04/21/2012 0400   GFRNONAA 86* 04/21/2012 0400   GFRAA >90 04/21/2012 0400    COAG Lab Results  Component Value Date   INR 0.97 04/16/2012   No results found for this basename: PTT      Intake/Output Summary (Last 24 hours) at 04/22/12 1001 Last data filed at 04/22/12 0800  Gross per 24 hour  Intake    200 ml  Output   1850 ml  Net  -1650 ml    Physical Exam:  BP Readings from Last 3 Encounters:  04/22/12 134/76  04/22/12 134/76  04/16/12 145/72   Temp Readings from Last 3 Encounters:  04/22/12 98.9 F (37.2 C) Oral  04/22/12 98.9 F (37.2 C) Oral  04/16/12 98 F (36.7 C)    SpO2 Readings from Last 3 Encounters:  04/22/12 92%  04/22/12 92%  04/16/12 94%   Pulse Readings from Last 3 Encounters:  04/22/12 108  04/22/12 108  04/16/12 91    Pt is A&O x 3 Gait is normal Speech is fluent right Neck Wound is healing well Patient with improved tongue deviation and Negative facial droop Pt has good and equal strength in all  extremities  Assessment/Plan:: Leslie [name] is a 67 y.o. female is S/P Right Carotid endarterectomy Pt is voiding, ambulating and taking po well    Discharge to: Home Follow-up in 2 weeks   Infinity Jeffords J [phone] 04/22/2012 10:01 AM

## 2012-04-23 ENCOUNTER — Encounter (HOSPITAL_COMMUNITY): Payer: Self-pay | Admitting: Surgery

## 2012-04-23 NOTE — Discharge Summary (Signed)
Agree with the above.  The patient was kept in the hospital one extra day for pulmonary issues.  Leslie [name]

## 2012-05-01 ENCOUNTER — Other Ambulatory Visit: Payer: Self-pay | Admitting: *Deleted

## 2012-05-01 DIAGNOSIS — I6529 Occlusion and stenosis of unspecified carotid artery: Secondary | ICD-10-CM

## 2012-05-11 ENCOUNTER — Encounter: Payer: Self-pay | Admitting: Surgery

## 2012-05-14 ENCOUNTER — Other Ambulatory Visit (INDEPENDENT_AMBULATORY_CARE_PROVIDER_SITE_OTHER): Payer: Medicare PPO | Admitting: Vascular Surgery

## 2012-05-14 ENCOUNTER — Encounter: Payer: Self-pay | Admitting: Surgery

## 2012-05-14 ENCOUNTER — Ambulatory Visit (INDEPENDENT_AMBULATORY_CARE_PROVIDER_SITE_OTHER): Payer: Medicare PPO | Admitting: Surgery

## 2012-05-14 NOTE — Progress Notes (Signed)
This is the patient's first postoperative visit. She is status post right carotid endarterectomy with bovine pericardial patch angioplasty. This was done on 04/20/2012 for asymptomatic right carotid stenosis. Intraoperative findings included a diffuse long segment stenosis which went from her skull base down to her clavicle. An extensive endarterectomy was performed. The patient's postoperative course was uncomplicated. She has had episodes of hypotension. She has minimal complaints today other than some numbness around her incision and occasional pain over her right eye.  On examination she was neurologically intact, her incision is healing nicely. Ultrasound was done today which showed a widely patent right carotid endarterectomy site.  Overall, the patient is doing very well. She will followup with me in 6 months. She will be getting her followup surveillance ultrasound studies with Dr. Allyson Sabal

## 2012-08-17 ENCOUNTER — Telehealth: Payer: Self-pay

## 2012-08-17 NOTE — Telephone Encounter (Signed)
Pt returned call and said she is not having any problems and no family hx of colon cancer. She is aware that we will have her on list to call for 03/2014 and she will call before then if she has any problems.

## 2012-08-17 NOTE — Telephone Encounter (Signed)
Pt was referred by Dr. Felecia Shelling for colonoscopy. Per our records, pt had EGD/TCS by [facility] on 04/09/2004. ( Hyperplastic polyp) . Would not be due until Jan 2016 unless having problems or recent family colon cancer).

## 2012-08-19 NOTE — Telephone Encounter (Signed)
Send Dr. Felecia Shelling copy of last tsc note, plse

## 2012-08-20 NOTE — Telephone Encounter (Signed)
Reminder in epic to follow up in Jan 2016

## 2012-08-20 NOTE — Telephone Encounter (Signed)
Letter, copy of procedure and path faxed to Dr. Felecia Shelling. Routing to Darl Pikes to nic for reminder in 03/2014.

## 2012-11-12 ENCOUNTER — Ambulatory Visit: Payer: Medicare PPO | Admitting: Surgery

## 2012-11-26 ENCOUNTER — Ambulatory Visit: Payer: Medicare PPO | Admitting: Surgery

## 2012-12-07 ENCOUNTER — Other Ambulatory Visit: Payer: Self-pay | Admitting: *Deleted

## 2012-12-07 MED ORDER — CLOPIDOGREL BISULFATE 75 MG PO TABS: 75.0000 mg | ORAL_TABLET | Freq: Every day | ORAL | Status: DC

## 2012-12-07 NOTE — Telephone Encounter (Signed)
Rx was sent to pharmacy electronically. 

## 2012-12-24 ENCOUNTER — Ambulatory Visit: Payer: Medicare PPO | Admitting: Surgery

## 2012-12-28 ENCOUNTER — Encounter: Payer: Self-pay | Admitting: Surgery

## 2012-12-31 ENCOUNTER — Encounter: Payer: Self-pay | Admitting: Surgery

## 2012-12-31 ENCOUNTER — Ambulatory Visit (INDEPENDENT_AMBULATORY_CARE_PROVIDER_SITE_OTHER): Payer: Medicare PPO | Admitting: Surgery

## 2012-12-31 DIAGNOSIS — I6529 Occlusion and stenosis of unspecified carotid artery: Secondary | ICD-10-CM

## 2012-12-31 DIAGNOSIS — Z48812 Encounter for surgical aftercare following surgery on the circulatory system: Secondary | ICD-10-CM

## 2012-12-31 DIAGNOSIS — I739 Peripheral vascular disease, unspecified: Secondary | ICD-10-CM

## 2012-12-31 NOTE — Progress Notes (Signed)
[facility] [place]   Patient name: Leslie [name] MRN: [id number] DOB: 1945/06/02 Sex: female     Chief Complaint  Patient presents with  . Carotid    6 month f/ u     HISTORY OF PRESENT ILLNESS: She is status post right carotid endarterectomy with bovine pericardial patch angioplasty. This was done on 04/20/2012 for asymptomatic right carotid stenosis. Intraoperative findings included a diffuse long segment stenosis which went from her skull base down to her clavicle. An extensive endarterectomy was perform back today for followup.  Her only complaint is that of some numbness around her incision.  She remains neurologically intact.  We discussed her limitations from a lower extremity standpoint.  She will get cramping and fatigue in her legs with activities.  Her biggest complaint is that of cramping and burning when she is lying down or sitting.    Past Medical History  Diagnosis Date  . PAD (peripheral artery disease)   . Tobacco abuse   . Rheumatoid arthritis(714.0)   . Carotid artery occlusion   . Hyperlipidemia     takes Atorvastatin daily  . HTN (hypertension), benign     takes Losartan daily  . Cough     phlem but no odor noted  . Pneumonia     several yrs ago  . History of migraine     last one at least 8months ago  . Back pain     DDD  . Dry skin   . Bruises easily     pt attributes this to Plavix and ASA  . History of colon polyps   . Panic attack     on the Feb 12, 2024Granddaughter died(54months)  . Coronary artery disease     no definite diagnosis found as of 04/17/12; no ischemia, EF 48% by Myoview stress 12/29/11 (Dr. Allyson Sabal)    Past Surgical History  Procedure Laterality Date  . Back surgery    . Leg stents    . Total knee arthroplasty    . Abdominal hysterectomy    . Ulnar nerve surgery    . Carpal tunnel release    . Trigger finger release    . Cholecystectomy    . Coronary angioplasty  01/06/12/2005/2006     2 stents  .  Givens capsule study    . Colonoscopy    . Esophagogastroduodenoscopy    . Endarterectomy  04/20/2012    Procedure: ENDARTERECTOMY CAROTID;  Surgeon: Nada Libman, MD;  Location: [facility] OR;  Service: Vascular;  Laterality: Right;  . Angioplasty  04/20/2012    Procedure: ANGIOPLASTY;  Surgeon: Nada Libman, MD;  Location: [facility] OR;  Service: Vascular;  Laterality: Right;  Using 1cm x6 cm vascu-guard patch     History   Social History  . Marital Status: Divorced    Spouse Name: N/A    Number of Children: N/A  . Years of Education: N/A   Occupational History  . Not on file.   Social History Main Topics  . Smoking status: Former Smoker -- 0.50 packs/day for 50 years    Types: Cigarettes  . Smokeless tobacco: Never Used     Comment: pt states that she uses nic patches  . Alcohol Use: No  . Drug Use: No  . Sexual Activity: Not on file   Other Topics Concern  . Not on file   Social History Narrative  . No narrative on file    Family History  Problem Relation  Age of Onset  . Hyperlipidemia Mother   . Hypertension Mother   . Cancer Father   . Diabetes Sister   . Hypertension Sister   . Diabetes Brother   . Heart disease Brother   . Hypertension Brother   . Heart attack Brother     Allergies as of 12/31/2012 - Review Complete 12/31/2012  Allergen Reaction Noted  . Latex Other (See Comments) 10/03/2011  . Tape Other (See Comments) 10/03/2011  . Lipitor [atorvastatin] Other (See Comments) 10/04/2011    Current Outpatient Prescriptions on File Prior to Visit  Medication Sig Dispense Refill  . aspirin EC 325 MG tablet Take 325 mg by mouth daily.      Marland Kitchen atorvastatin (LIPITOR) 40 MG tablet Take 40 mg by mouth daily.      . clopidogrel (PLAVIX) 75 MG tablet Take 1 tablet (75 mg total) by mouth daily.  30 tablet  3  . losartan (COZAAR) 100 MG tablet Take 100 mg by mouth daily.      Marland Kitchen acetaminophen (TYLENOL) 500 MG tablet Take 1,000 mg by mouth every 6 (six) hours as needed.  For pain      . oxyCODONE (OXY IR/ROXICODONE) 5 MG immediate release tablet Take 1 tablet (5 mg total) by mouth once as needed.  30 tablet  0   No current facility-administered medications on file prior to visit.     REVIEW OF SYSTEMS: See history of present illness, otherwise negative  PHYSICAL EXAMINATION:   Vital signs are BP 134/76  Pulse 83  Temp(Src) 98.2 F (36.8 C) (Oral)  Ht 5\' 7"  (1.702 m)  Wt 167 lb (75.751 kg)  BMI 26.15 kg/m2  SpO2 100% General: The patient appears their stated age. HEENT:  No gross abnormalities Pulmonary:  Non labored breathing Musculoskeletal: There are no major deformities. Neurologic: No focal weakness or paresthesias are detected, Skin: There are no ulcer or rashes noted. Psychiatric: The patient has normal affect. Cardiovascular: There is a regular rate and rhythm without significant murmur appreciated.   Diagnostic Studies None  Assessment: Carotid disease: Lower extremity disease Plan: #1: The patient has yet to have her 6 month followup carotid duplex.  She did have a study approximately one month postoperative period she will need a carotid ultrasound within the next 6 months.  Peripheral vascular disease: The patient complains of lower extremity fatigue with activity.  After a while informed discussion, she feels that this is manageable and not lifestyle limiting at this time.  I did give her a prescription for cilostazol to see if this helps her symptoms.  This will also be followed up in 6 months.  The patient told me that she wanted me to be responsible for her vascular care moving forward.  Jorge Ny, M.D. [facility] [place] Office: [phone] Pager:  [phone]

## 2013-01-01 NOTE — Addendum Note (Signed)
Addended by: Sharee Pimple on: 01/01/2013 08:33 AM   Modules accepted: Orders

## 2013-04-19 ENCOUNTER — Other Ambulatory Visit (HOSPITAL_COMMUNITY): Payer: Self-pay | Admitting: Internal Medicine

## 2013-04-19 DIAGNOSIS — Z139 Encounter for screening, unspecified: Secondary | ICD-10-CM

## 2013-04-26 ENCOUNTER — Ambulatory Visit (HOSPITAL_COMMUNITY)
Admission: RE | Admit: 2013-04-26 | Discharge: 2013-04-26 | Disposition: A | Discharge status: Home / Self Care | Payer: Medicare HMO | Source: Ambulatory Visit | Attending: Internal Medicine | Admitting: Internal Medicine

## 2013-04-26 DIAGNOSIS — Z139 Encounter for screening, unspecified: Secondary | ICD-10-CM

## 2013-04-26 DIAGNOSIS — Z1231 Encounter for screening mammogram for malignant neoplasm of breast: Secondary | ICD-10-CM | POA: Insufficient documentation

## 2013-07-12 ENCOUNTER — Encounter: Payer: Self-pay | Admitting: Surgery

## 2013-07-15 ENCOUNTER — Ambulatory Visit (HOSPITAL_COMMUNITY)
Admission: RE | Admit: 2013-07-15 | Discharge: 2013-07-15 | Disposition: A | Discharge status: Home / Self Care | Payer: Medicare HMO | Source: Ambulatory Visit | Attending: Surgery | Admitting: Surgery

## 2013-07-15 ENCOUNTER — Ambulatory Visit (INDEPENDENT_AMBULATORY_CARE_PROVIDER_SITE_OTHER)
Admission: RE | Admit: 2013-07-15 | Discharge: 2013-07-15 | Disposition: A | Discharge status: Home / Self Care | Payer: Medicare HMO | Source: Ambulatory Visit | Attending: Surgery | Admitting: Surgery

## 2013-07-15 ENCOUNTER — Ambulatory Visit (INDEPENDENT_AMBULATORY_CARE_PROVIDER_SITE_OTHER): Payer: Medicare HMO | Admitting: Surgery

## 2013-07-15 DIAGNOSIS — I739 Peripheral vascular disease, unspecified: Secondary | ICD-10-CM

## 2013-07-15 DIAGNOSIS — Z48812 Encounter for surgical aftercare following surgery on the circulatory system: Secondary | ICD-10-CM

## 2013-07-15 DIAGNOSIS — I6529 Occlusion and stenosis of unspecified carotid artery: Secondary | ICD-10-CM | POA: Insufficient documentation

## 2013-08-19 ENCOUNTER — Ambulatory Visit: Payer: Medicare HMO | Admitting: Surgery

## 2013-09-06 ENCOUNTER — Encounter: Payer: Self-pay | Admitting: Surgery

## 2013-09-09 ENCOUNTER — Encounter: Payer: Self-pay | Admitting: Surgery

## 2013-09-09 ENCOUNTER — Ambulatory Visit (INDEPENDENT_AMBULATORY_CARE_PROVIDER_SITE_OTHER): Payer: Medicare HMO | Admitting: Surgery

## 2013-09-09 VITALS — BP 119/74 | HR 74 | Ht 67.0 in | Wt 169.0 lb

## 2013-09-09 DIAGNOSIS — I6529 Occlusion and stenosis of unspecified carotid artery: Secondary | ICD-10-CM

## 2013-09-09 MED ORDER — CILOSTAZOL 100 MG PO TABS: 100.0000 mg | ORAL_TABLET | Freq: Two times a day (BID) | ORAL | Status: DC

## 2013-09-09 NOTE — Progress Notes (Signed)
Patient name: Leslie [name] MRN: [id number] DOB: [birth year] Sex: female     Chief Complaint  Patient presents with  . Re-evaluation    f/u from 04/27 carotid duplex    HISTORY OF PRESENT ILLNESS: She is status post right carotid endarterectomy with bovine pericardial patch angioplasty. This was done on 04/20/2012 for asymptomatic right carotid stenosis. Intraoperative findings included a diffuse long segment stenosis which went from her skull base down to her clavicle. An extensive endarterectomy was performed.  She is back today for followup.  I have also been following her for claudication.  She has a known occlusion of a right superficial femoral stent.  She does have claudication but feels she can tolerate her symptoms.  I gave her a prescription for cilostazol at our last visit.  She did not get this filled because she lost.  She reports no ulcers or rest pain   Past Medical History  Diagnosis Date  . PAD (peripheral artery disease)   . Tobacco abuse   . Rheumatoid arthritis(714.0)   . Carotid artery occlusion   . Hyperlipidemia     takes Atorvastatin daily  . HTN (hypertension), benign     takes Losartan daily  . Cough     phlem but no odor noted  . Pneumonia     several yrs ago  . History of migraine     last one at least 65months ago  . Back pain     DDD  . Dry skin   . Bruises easily     pt attributes this to Plavix and ASA  . History of colon polyps   . Panic attack     on the 04-16-22 Granddaughter died(89months)  . Coronary artery disease     no definite diagnosis found as of 04/17/12; no ischemia, EF 48% by Myoview stress 12/29/11 (Dr. Gwenlyn Found)    Past Surgical History  Procedure Laterality Date  . Back surgery    . Leg stents    . Total knee arthroplasty    . Abdominal hysterectomy    . Ulnar nerve surgery    . Carpal tunnel release    . Trigger finger release    . Cholecystectomy    . Coronary angioplasty  01/06/12/2005/2006     2 stents  .  Givens capsule study    . Colonoscopy    . Esophagogastroduodenoscopy    . Endarterectomy  04/20/2012    Procedure: ENDARTERECTOMY CAROTID;  Surgeon: Serafina Mitchell, MD;  Location: [place];  Service: Vascular;  Laterality: Right;  . Angioplasty  04/20/2012    Procedure: ANGIOPLASTY;  Surgeon: Serafina Mitchell, MD;  Location: [facility] OR;  Service: Vascular;  Laterality: Right;  Using 1cm x6 cm vascu-guard patch     History   Social History  . Marital Status: Divorced    Spouse Name: N/A    Number of Children: N/A  . Years of Education: N/A   Occupational History  . Not on file.   Social History Main Topics  . Smoking status: Former Smoker -- 0.50 packs/day for 50 years    Types: Cigarettes  . Smokeless tobacco: Never Used     Comment: pt states that she uses nic patches  . Alcohol Use: No  . Drug Use: No  . Sexual Activity: Not on file   Other Topics Concern  . Not on file   Social History Narrative  . No narrative on file    Family  History  Problem Relation Age of Onset  . Hyperlipidemia Mother   . Hypertension Mother   . Cancer Father   . Diabetes Sister   . Hypertension Sister   . Diabetes Brother   . Heart disease Brother   . Hypertension Brother   . Heart attack Brother     Allergies as of 09/09/2013 - Review Complete 09/09/2013  Allergen Reaction Noted  . Latex Other (See Comments) 10/03/2011  . Tape Other (See Comments) 10/03/2011  . Lipitor [atorvastatin] Other (See Comments) 10/04/2011    Current Outpatient Prescriptions on File Prior to Visit  Medication Sig Dispense Refill  . acetaminophen (TYLENOL) 500 MG tablet Take 1,000 mg by mouth every 6 (six) hours as needed. For pain      . aspirin EC 325 MG tablet Take 325 mg by mouth daily.      Marland Kitchen atorvastatin (LIPITOR) 40 MG tablet Take 40 mg by mouth daily.      . clopidogrel (PLAVIX) 75 MG tablet Take 1 tablet (75 mg total) by mouth daily.  30 tablet  3  . hydrochlorothiazide (MICROZIDE) 12.5 MG capsule Take  12.5 mg by mouth daily.      Marland Kitchen losartan (COZAAR) 100 MG tablet Take 100 mg by mouth daily.      Marland Kitchen oxyCODONE (OXY IR/ROXICODONE) 5 MG immediate release tablet Take 1 tablet (5 mg total) by mouth once as needed.  30 tablet  0   No current facility-administered medications on file prior to visit.     REVIEW OF SYSTEMS: Cardiovascular: No chest pain, chest pressure, palpitations, orthopnea, or dyspnea on exertion.  Positive for claudication,  No history of DVT or phlebitis. Pulmonary: No productive cough, asthma or wheezing. Neurologic: No weakness, paresthesias, aphasia, or amaurosis. No dizziness. Hematologic: No bleeding problems or clotting disorders. Musculoskeletal: No joint pain or joint swelling. Gastrointestinal: No blood in stool or hematemesis Genitourinary: No dysuria or hematuria. Psychiatric:: No history of major depression. Integumentary: No rashes or ulcers. Constitutional: No fever or chills.  PHYSICAL EXAMINATION:   Vital signs are BP 119/74  Pulse 74  Ht 5\' 7"  (1.702 m)  Wt 169 lb (76.658 kg)  BMI 26.46 kg/m2  SpO2 100% General: The patient appears their stated age. HEENT:  No gross abnormalities Pulmonary:  Non labored breathing Musculoskeletal: There are no major deformities. Neurologic: No focal weakness or paresthesias are detected, Skin: There are no ulcer or rashes noted. Psychiatric: The patient has normal affect. Cardiovascular: There is a regular rate and rhythm without significant murmur appreciated.   Diagnostic Studies I have reviewed her ultrasound studies.  The right carotid endarterectomy site is widely patent.  40-59% left carotid stenosis.  ABI on the right is 0.69.  On the left is 0.77.  There is a right superficial femoral occlusion.  There is a greater than 50% mid superficial femoral artery stenosis on the left.  She has bilateral 50% common femoral stenosis.  Assessment: #1: Carotid artery disease, status post endarterectomy #2:  Peripheral vascular disease with claudication Plan: #1: The patient's most recent carotid ultrasound shows a widely patent endarterectomy site with mild-moderate stenosis on the left.  The patient will followup in one year with a repeat carotid ultrasound #2: I again stressed the importance of medical management for peripheral vascular disease.  She agrees that her symptoms are not lifestyle limiting at this time.  She can tolerate her degree of disability.  I have again given her a prescription for cilostazol.  We did stop  her Plavix today as she is having significant bruising while taking dual antiplatelet therapy.  From what I can tell this was started when she had lower extremity revascularization, which has now failed.  She'll continue to take 325 mg of aspirin.  Eldridge Abrahams, M.D. [facility] [place] Office: [phone] Pager:  [phone]

## 2014-01-06 ENCOUNTER — Encounter: Payer: Self-pay | Admitting: Internal Medicine

## 2014-02-27 ENCOUNTER — Encounter (HOSPITAL_COMMUNITY): Payer: Self-pay | Admitting: Cardiovascular Disease

## 2014-07-21 ENCOUNTER — Other Ambulatory Visit (HOSPITAL_COMMUNITY): Payer: Self-pay | Admitting: Respiratory Therapy

## 2014-07-21 DIAGNOSIS — I10 Essential (primary) hypertension: Secondary | ICD-10-CM

## 2014-07-21 DIAGNOSIS — I2510 Atherosclerotic heart disease of native coronary artery without angina pectoris: Secondary | ICD-10-CM

## 2014-07-21 DIAGNOSIS — I739 Peripheral vascular disease, unspecified: Secondary | ICD-10-CM

## 2014-09-05 ENCOUNTER — Other Ambulatory Visit: Payer: Self-pay | Admitting: *Deleted

## 2014-09-05 DIAGNOSIS — Z48812 Encounter for surgical aftercare following surgery on the circulatory system: Secondary | ICD-10-CM

## 2014-09-05 DIAGNOSIS — I739 Peripheral vascular disease, unspecified: Secondary | ICD-10-CM

## 2014-09-05 DIAGNOSIS — I6523 Occlusion and stenosis of bilateral carotid arteries: Secondary | ICD-10-CM

## 2014-09-12 ENCOUNTER — Encounter: Payer: Self-pay | Admitting: Family

## 2014-09-15 ENCOUNTER — Ambulatory Visit: Payer: Medicare HMO | Admitting: Family

## 2014-09-15 ENCOUNTER — Encounter (HOSPITAL_COMMUNITY): Payer: Medicare HMO

## 2014-09-15 ENCOUNTER — Other Ambulatory Visit (HOSPITAL_COMMUNITY): Payer: Medicare HMO

## 2014-09-16 ENCOUNTER — Encounter: Payer: Self-pay | Admitting: Vascular Surgery

## 2014-09-17 ENCOUNTER — Ambulatory Visit: Payer: Medicare HMO | Admitting: Family

## 2014-09-17 ENCOUNTER — Encounter (HOSPITAL_COMMUNITY): Payer: Medicare HMO

## 2014-11-26 ENCOUNTER — Encounter: Payer: Self-pay | Admitting: Family

## 2014-11-27 ENCOUNTER — Encounter (HOSPITAL_COMMUNITY): Payer: Commercial Managed Care - HMO

## 2014-11-27 ENCOUNTER — Ambulatory Visit: Payer: Self-pay | Admitting: Family

## 2014-11-27 ENCOUNTER — Encounter (HOSPITAL_COMMUNITY): Payer: Self-pay

## 2015-02-06 DIAGNOSIS — R0989 Other specified symptoms and signs involving the circulatory and respiratory systems: Secondary | ICD-10-CM | POA: Diagnosis not present

## 2015-02-06 DIAGNOSIS — J069 Acute upper respiratory infection, unspecified: Secondary | ICD-10-CM | POA: Diagnosis not present

## 2015-02-06 DIAGNOSIS — J029 Acute pharyngitis, unspecified: Secondary | ICD-10-CM | POA: Diagnosis not present

## 2015-03-19 ENCOUNTER — Inpatient Hospital Stay (HOSPITAL_COMMUNITY)
Admission: EM | Admit: 2015-03-19 | Discharge: 2015-03-21 | DRG: 292 | Disposition: A | Discharge status: Home / Self Care | Payer: Commercial Managed Care - HMO | Attending: Internal Medicine | Admitting: Internal Medicine

## 2015-03-19 ENCOUNTER — Emergency Department (HOSPITAL_COMMUNITY): Payer: Commercial Managed Care - HMO

## 2015-03-19 ENCOUNTER — Encounter (HOSPITAL_COMMUNITY): Payer: Self-pay | Admitting: Emergency Medicine

## 2015-03-19 DIAGNOSIS — I5031 Acute diastolic (congestive) heart failure: Secondary | ICD-10-CM | POA: Diagnosis not present

## 2015-03-19 DIAGNOSIS — Z833 Family history of diabetes mellitus: Secondary | ICD-10-CM | POA: Diagnosis not present

## 2015-03-19 DIAGNOSIS — F410 Panic disorder [episodic paroxysmal anxiety] without agoraphobia: Secondary | ICD-10-CM | POA: Diagnosis present

## 2015-03-19 DIAGNOSIS — R05 Cough: Secondary | ICD-10-CM | POA: Diagnosis not present

## 2015-03-19 DIAGNOSIS — M069 Rheumatoid arthritis, unspecified: Secondary | ICD-10-CM | POA: Diagnosis present

## 2015-03-19 DIAGNOSIS — I2510 Atherosclerotic heart disease of native coronary artery without angina pectoris: Secondary | ICD-10-CM | POA: Diagnosis not present

## 2015-03-19 DIAGNOSIS — Z8249 Family history of ischemic heart disease and other diseases of the circulatory system: Secondary | ICD-10-CM

## 2015-03-19 DIAGNOSIS — Z7982 Long term (current) use of aspirin: Secondary | ICD-10-CM

## 2015-03-19 DIAGNOSIS — R0600 Dyspnea, unspecified: Secondary | ICD-10-CM | POA: Diagnosis present

## 2015-03-19 DIAGNOSIS — J441 Chronic obstructive pulmonary disease with (acute) exacerbation: Secondary | ICD-10-CM | POA: Diagnosis not present

## 2015-03-19 DIAGNOSIS — Z23 Encounter for immunization: Secondary | ICD-10-CM | POA: Diagnosis not present

## 2015-03-19 DIAGNOSIS — Z7902 Long term (current) use of antithrombotics/antiplatelets: Secondary | ICD-10-CM

## 2015-03-19 DIAGNOSIS — I509 Heart failure, unspecified: Secondary | ICD-10-CM

## 2015-03-19 DIAGNOSIS — I10 Essential (primary) hypertension: Secondary | ICD-10-CM

## 2015-03-19 DIAGNOSIS — E785 Hyperlipidemia, unspecified: Secondary | ICD-10-CM | POA: Diagnosis present

## 2015-03-19 DIAGNOSIS — I110 Hypertensive heart disease with heart failure: Principal | ICD-10-CM | POA: Diagnosis present

## 2015-03-19 DIAGNOSIS — I5033 Acute on chronic diastolic (congestive) heart failure: Secondary | ICD-10-CM | POA: Diagnosis not present

## 2015-03-19 DIAGNOSIS — Z716 Tobacco abuse counseling: Secondary | ICD-10-CM | POA: Diagnosis not present

## 2015-03-19 DIAGNOSIS — F17210 Nicotine dependence, cigarettes, uncomplicated: Secondary | ICD-10-CM | POA: Diagnosis not present

## 2015-03-19 DIAGNOSIS — E7800 Pure hypercholesterolemia, unspecified: Secondary | ICD-10-CM | POA: Diagnosis present

## 2015-03-19 DIAGNOSIS — I5022 Chronic systolic (congestive) heart failure: Secondary | ICD-10-CM | POA: Diagnosis not present

## 2015-03-19 DIAGNOSIS — I739 Peripheral vascular disease, unspecified: Secondary | ICD-10-CM | POA: Diagnosis present

## 2015-03-19 LAB — CBC
HCT: 37.1 % (ref 36.0–46.0)
HEMOGLOBIN: 11.8 g/dL — AB (ref 12.0–15.0)
MCH: 26.9 pg (ref 26.0–34.0)
MCHC: 31.8 g/dL (ref 30.0–36.0)
MCV: 84.7 fL (ref 78.0–100.0)
PLATELETS: 210 10*3/uL (ref 150–400)
RBC: 4.38 MIL/uL (ref 3.87–5.11)
RDW: 15.6 % — ABNORMAL HIGH (ref 11.5–15.5)
WBC: 5.6 10*3/uL (ref 4.0–10.5)

## 2015-03-19 LAB — BASIC METABOLIC PANEL
ANION GAP: 11 (ref 5–15)
BUN: 15 mg/dL (ref 6–20)
CHLORIDE: 96 mmol/L — AB (ref 101–111)
CO2: 29 mmol/L (ref 22–32)
CREATININE: 1.03 mg/dL — AB (ref 0.44–1.00)
Calcium: 9.1 mg/dL (ref 8.9–10.3)
GFR calc non Af Amer: 55 mL/min — ABNORMAL LOW (ref >60)
Glucose, Bld: 110 mg/dL — ABNORMAL HIGH (ref 65–99)
POTASSIUM: 3.9 mmol/L (ref 3.5–5.1)
SODIUM: 136 mmol/L (ref 135–145)

## 2015-03-19 LAB — TROPONIN I
TROPONIN I: 0.06 ng/mL — AB (ref <0.031)
Troponin I: 0.06 ng/mL — ABNORMAL HIGH (ref <0.031)

## 2015-03-19 LAB — BRAIN NATRIURETIC PEPTIDE: B NATRIURETIC PEPTIDE 5: 272 pg/mL — AB (ref 0.0–100.0)

## 2015-03-19 LAB — TSH: TSH: 0.53 u[IU]/mL (ref 0.350–4.500)

## 2015-03-19 MED ORDER — ALBUTEROL SULFATE HFA 108 (90 BASE) MCG/ACT IN AERS
2.0000 | INHALATION_SPRAY | Freq: Once | RESPIRATORY_TRACT | Status: AC
  Administered 2015-03-19: 2 via RESPIRATORY_TRACT
  Filled 2015-03-19: qty 6.7

## 2015-03-19 MED ORDER — ACETAMINOPHEN 325 MG PO TABS
650.0000 mg | ORAL_TABLET | ORAL | Status: DC | PRN
  Administered 2015-03-20: 650 mg via ORAL
  Filled 2015-03-19: qty 2

## 2015-03-19 MED ORDER — ASPIRIN EC 325 MG PO TBEC
325.0000 mg | DELAYED_RELEASE_TABLET | Freq: Every day | ORAL | Status: DC
  Administered 2015-03-20 – 2015-03-21 (×2): 325 mg via ORAL
  Filled 2015-03-19 (×2): qty 1

## 2015-03-19 MED ORDER — CLOPIDOGREL BISULFATE 75 MG PO TABS
75.0000 mg | ORAL_TABLET | Freq: Every day | ORAL | Status: DC
  Administered 2015-03-20 – 2015-03-21 (×2): 75 mg via ORAL
  Filled 2015-03-19 (×2): qty 1

## 2015-03-19 MED ORDER — CILOSTAZOL 100 MG PO TABS
100.0000 mg | ORAL_TABLET | Freq: Two times a day (BID) | ORAL | Status: DC
  Administered 2015-03-20 – 2015-03-21 (×3): 100 mg via ORAL
  Filled 2015-03-19 (×8): qty 1

## 2015-03-19 MED ORDER — LOSARTAN POTASSIUM 50 MG PO TABS
100.0000 mg | ORAL_TABLET | Freq: Every day | ORAL | Status: DC
  Administered 2015-03-20: 100 mg via ORAL
  Filled 2015-03-19: qty 2

## 2015-03-19 MED ORDER — FUROSEMIDE 10 MG/ML IJ SOLN
40.0000 mg | Freq: Two times a day (BID) | INTRAMUSCULAR | Status: DC
  Administered 2015-03-20: 40 mg via INTRAVENOUS
  Filled 2015-03-19: qty 4

## 2015-03-19 MED ORDER — FUROSEMIDE 10 MG/ML IJ SOLN
40.0000 mg | Freq: Once | INTRAMUSCULAR | Status: AC
  Administered 2015-03-19: 40 mg via INTRAVENOUS
  Filled 2015-03-19: qty 4

## 2015-03-19 MED ORDER — ONDANSETRON HCL 4 MG/2ML IJ SOLN
4.0000 mg | Freq: Four times a day (QID) | INTRAMUSCULAR | Status: DC | PRN
Start: 2015-03-19 — End: 2015-03-21

## 2015-03-19 MED ORDER — ENOXAPARIN SODIUM 40 MG/0.4ML ~~LOC~~ SOLN
40.0000 mg | SUBCUTANEOUS | Status: DC
  Administered 2015-03-20: 40 mg via SUBCUTANEOUS
  Filled 2015-03-19: qty 0.4

## 2015-03-19 MED ORDER — HYDROCOD POLST-CPM POLST ER 10-8 MG/5ML PO SUER
5.0000 mL | Freq: Once | ORAL | Status: AC
  Administered 2015-03-19: 5 mL via ORAL
  Filled 2015-03-19: qty 5

## 2015-03-19 MED ORDER — ATORVASTATIN CALCIUM 40 MG PO TABS
40.0000 mg | ORAL_TABLET | Freq: Every day | ORAL | Status: DC
  Filled 2015-03-19 (×2): qty 1

## 2015-03-19 MED ORDER — ASPIRIN 81 MG PO CHEW
324.0000 mg | CHEWABLE_TABLET | Freq: Once | ORAL | Status: AC
  Administered 2015-03-19: 324 mg via ORAL
  Filled 2015-03-19: qty 4

## 2015-03-19 MED ORDER — HYDROCHLOROTHIAZIDE 12.5 MG PO CAPS
12.5000 mg | ORAL_CAPSULE | Freq: Every day | ORAL | Status: DC
  Administered 2015-03-20 – 2015-03-21 (×2): 12.5 mg via ORAL
  Filled 2015-03-19 (×2): qty 1

## 2015-03-19 MED ORDER — SODIUM CHLORIDE 0.9 % IJ SOLN
3.0000 mL | Freq: Two times a day (BID) | INTRAMUSCULAR | Status: DC
Start: 2015-03-19 — End: 2015-03-21
  Administered 2015-03-20 – 2015-03-21 (×3): 3 mL via INTRAVENOUS

## 2015-03-19 NOTE — H&P (Signed)
[facility] History and Physical  Leslie [name] [id number] DOB: 04/27/1945 DOA: 03/19/2015  Referring physician: ER PCP: Rosita Fire, MD   Chief Complaint: Cough, dyspnea  HPI: Leslie [name] is a 69 y.o. female  This is a 69 year old lady who has history of carotid artery disease and peripheral vascular disease who presents with almost a month's history of cough productive of white sputum associated with dyspnea. She gets short of breath on exertion minimally. She denies any chest pain or palpitations. She denies any fever. She denies any PND or orthopnea. There is no ankle or leg swelling. Evaluation in the emergency room showed a chest x-ray that is consistent with congestive heart failure. She is now admitted for further evaluation.   Review of Systems:  Apart from symptoms above, all systems are negative.  Past Medical History  Diagnosis Date  . PAD (peripheral artery disease) ([place])   . Tobacco abuse   . Rheumatoid arthritis(714.0)   . Carotid artery occlusion   . Hyperlipidemia     takes Atorvastatin daily  . HTN (hypertension), benign     takes Losartan daily  . Cough     phlem but no odor noted  . Pneumonia     several yrs ago  . History of migraine     last one at least 52months ago  . Back pain     DDD  . Dry skin   . Bruises easily     pt attributes this to Plavix and ASA  . History of colon polyps   . Panic attack     on the 2022-04-27 Granddaughter died(21months)  . Coronary artery disease     no definite diagnosis found as of 04/17/12; no ischemia, EF 48% by Myoview stress 12/29/11 (Dr. Gwenlyn Found)  . History of bruising easily    Past Surgical History  Procedure Laterality Date  . Back surgery    . Leg stents    . Total knee arthroplasty    . Abdominal hysterectomy    . Ulnar nerve surgery    . Carpal tunnel release    . Trigger finger release    . Cholecystectomy    . Coronary angioplasty  01/06/12/2005/2006     2 stents  . Givens capsule  study    . Colonoscopy    . Esophagogastroduodenoscopy    . Endarterectomy  04/20/2012    Procedure: ENDARTERECTOMY CAROTID;  Surgeon: Serafina Mitchell, MD;  Location: [place];  Service: Vascular;  Laterality: Right;  . Angioplasty  04/20/2012    Procedure: ANGIOPLASTY;  Surgeon: Serafina Mitchell, MD;  Location: [facility] OR;  Service: Vascular;  Laterality: Right;  Using 1cm x6 cm vascu-guard patch   . Carotid angiogram N/A 01/06/2012    Procedure: CAROTID ANGIOGRAM;  Surgeon: Lorretta Harp, MD;  Location: [facility] CATH LAB;  Service: Cardiovascular;  Laterality: N/A;  . Lower extremity angiogram N/A 01/06/2012    Procedure: LOWER EXTREMITY ANGIOGRAM;  Surgeon: Lorretta Harp, MD;  Location: [facility] CATH LAB;  Service: Cardiovascular;  Laterality: N/A;  . Cardiac catheterization  01/31/2005   Social History:  reports that she has been smoking Cigarettes.  She has a 25 pack-year smoking history. She has never used smokeless tobacco. She reports that she drinks alcohol. She reports that she does not use illicit drugs.  Allergies  Allergen Reactions  . Latex Other (See Comments)    Burns skin   . Tape Other (See Comments)    Burns skin   .  Lipitor [Atorvastatin] Other (See Comments)    myalgia    Family History  Problem Relation Age of Onset  . Hyperlipidemia Mother   . Hypertension Mother   . Cancer Father   . Diabetes Sister   . Hypertension Sister   . Diabetes Brother   . Heart disease Brother   . Hypertension Brother   . Heart attack Brother      Prior to Admission medications   Medication Sig Start Date End Date Taking? Authorizing Provider  acetaminophen (TYLENOL) 500 MG tablet Take 1,000 mg by mouth every 6 (six) hours as needed. For pain    Historical Provider, MD  aspirin EC 325 MG tablet Take 325 mg by mouth daily.    Historical Provider, MD  atorvastatin (LIPITOR) 40 MG tablet Take 40 mg by mouth daily.    Historical Provider, MD  cilostazol (PLETAL) 100 MG tablet Take 1 tablet (100  mg total) by mouth 2 (two) times daily. 09/09/13   Serafina Mitchell, MD  clopidogrel (PLAVIX) 75 MG tablet Take 1 tablet (75 mg total) by mouth daily. 12/07/12   Lorretta Harp, MD  hydrochlorothiazide (MICROZIDE) 12.5 MG capsule Take 12.5 mg by mouth daily.    Historical Provider, MD  losartan (COZAAR) 100 MG tablet Take 100 mg by mouth daily.    Historical Provider, MD  oxyCODONE (OXY IR/ROXICODONE) 5 MG immediate release tablet Take 1 tablet (5 mg total) by mouth once as needed. 04/20/12   Ulyses Amor, PA-C   Physical Exam: Filed Vitals:   03/19/15 1755 03/19/15 1758 03/19/15 2053  BP: 133/79  143/72  Pulse: 113  107  Temp: 98.7 F (37.1 C)    TempSrc: Oral    Resp: 24  20  Height: 5\' 7"  (1.702 m)    Weight: 75.751 kg (167 lb)    SpO2: 96% 93% 99%    Wt Readings from Last 3 Encounters:  03/19/15 75.751 kg (167 lb)  09/09/13 76.658 kg (169 lb)  12/31/12 75.751 kg (167 lb)    General:  Appears calm and comfortable. There is no increased work of breathing at rest. Eyes: PERRL, normal lids, irises & conjunctiva ENT: grossly normal hearing, lips & tongue Neck: no LAD, masses or thyromegaly Cardiovascular: RRR, no m/r/g. No LE edema. Telemetry: SR, no arrhythmias  Respiratory: she has bilateral wheezing with inspiratory crackles at the bases and mid zones. There is no bronchial breathing.  Abdomen: soft, ntnd Skin: no rash or induration seen on limited exam Musculoskeletal: grossly normal tone BUE/BLE Psychiatric: grossly normal mood and affect, speech fluent and appropriate Neurologic: grossly non-focal.          Labs on Admission:  Basic Metabolic Panel:  Recent Labs Lab 03/19/15 1950  NA 136  K 3.9  CL 96*  CO2 29  GLUCOSE 110*  BUN 15  CREATININE 1.03*  CALCIUM 9.1   Liver Function Tests: No results for input(s): AST, ALT, ALKPHOS, BILITOT, PROT, ALBUMIN in the last 168 hours. No results for input(s): LIPASE, AMYLASE in the last 168 hours. No results for  input(s): AMMONIA in the last 168 hours. CBC: No results for input(s): WBC, NEUTROABS, HGB, HCT, MCV, PLT in the last 168 hours. Cardiac Enzymes:  Recent Labs Lab 03/19/15 1950  TROPONINI 0.06*    BNP (last 3 results)  Recent Labs  03/19/15 1950  BNP 272.0*    ProBNP (last 3 results) No results for input(s): PROBNP in the last 8760 hours.  CBG: No results  for input(s): GLUCAP in the last 168 hours.  Radiological Exams on Admission: Dg Chest 2 View  03/19/2015  CLINICAL DATA:  Nonproductive cough for the past 2 days. General myalgias. EXAM: CHEST  2 VIEW COMPARISON:  04/16/2012. FINDINGS: The cardiac silhouette is enlarged with an interval increase in size. Increased prominence of the pulmonary vasculature and interstitial markings. The lungs are hyperexpanded and no pleural fluid is seen. Diffuse peribronchial thickening is noted. Unremarkable bones. IMPRESSION: Progressive cardiomegaly with interval mild changes of congestive heart failure superimposed on COPD and chronic bronchitis. Electronically Signed   By: Claudie Revering M.D.   On: 03/19/2015 18:30    EKG: Independently reviewed-Sinus tachycardia with T-wave changes which are old with ST segment depressions laterally. This is unchanged from previous EEG. There is no acute ST elevation.   Assessment/Plan   1. Congestive heart failure. She will be treated with intravenous diuretics. Obtain echocardiogram. Cardiology consultation in the morning. 2. Elevated troponin level. Cycle cardiac enzymes. 3. Hypertension. Continue with home medications. 4. Peripheral vascular disease. Continue with home medications.   She'll be admitted to telemetry. Further recommendations will depend on patient's hospital progress  Code Status: Full code.  DVT Prophylaxis:Lovenox.   Family Communication: I discussed the plan with the patient at the bedside.   Disposition Plan: home when medically stable.  Time spent: 60 minutes.    Doree Albee [facility] Pager [phone].

## 2015-03-19 NOTE — ED Notes (Signed)
PT states congested non-productive cough x2 days with generalized body aches. PT states antibiotic therapy x2 weeks ago for same.

## 2015-03-19 NOTE — ED Provider Notes (Signed)
CSN: [id number]     Arrival date & time 03/19/15  1744 History   First MD Initiated Contact with Patient 03/19/15 1839     Chief Complaint  Patient presents with  . Cough     (Consider location/radiation/quality/duration/timing/severity/associated sxs/prior Treatment) Patient is a 69 y.o. female presenting with cough. The history is provided by the patient.  Cough Cough characteristics:  Productive Sputum characteristics:  Clear Severity:  Moderate Onset quality:  Gradual Duration:  2 days Timing:  Intermittent Progression:  Worsening Smoker: yes   Context: sick contacts, upper respiratory infection and weather changes   Relieved by:  Nothing Worsened by:  Nothing tried Associated symptoms: chills, myalgias, shortness of breath and wheezing   Associated symptoms: no chest pain, no diaphoresis, no fever and no sore throat   Risk factors: no recent travel     Past Medical History  Diagnosis Date  . PAD (peripheral artery disease) ([place])   . Tobacco abuse   . Rheumatoid arthritis(714.0)   . Carotid artery occlusion   . Hyperlipidemia     takes Atorvastatin daily  . HTN (hypertension), benign     takes Losartan daily  . Cough     phlem but no odor noted  . Pneumonia     several yrs ago  . History of migraine     last one at least 1months ago  . Back pain     DDD  . Dry skin   . Bruises easily     pt attributes this to Plavix and ASA  . History of colon polyps   . Panic attack     on the 04-11-22 Granddaughter died(54months)  . Coronary artery disease     no definite diagnosis found as of 04/17/12; no ischemia, EF 48% by Myoview stress 12/29/11 (Dr. Gwenlyn Found)  . History of bruising easily    Past Surgical History  Procedure Laterality Date  . Back surgery    . Leg stents    . Total knee arthroplasty    . Abdominal hysterectomy    . Ulnar nerve surgery    . Carpal tunnel release    . Trigger finger release    . Cholecystectomy    . Coronary angioplasty   01/06/12/2005/2006     2 stents  . Givens capsule study    . Colonoscopy    . Esophagogastroduodenoscopy    . Endarterectomy  04/20/2012    Procedure: ENDARTERECTOMY CAROTID;  Surgeon: Serafina Mitchell, MD;  Location: [facility];  Service: Vascular;  Laterality: Right;  . Angioplasty  04/20/2012    Procedure: ANGIOPLASTY;  Surgeon: Serafina Mitchell, MD;  Location: [facility] OR;  Service: Vascular;  Laterality: Right;  Using 1cm x6 cm vascu-guard patch   . Carotid angiogram N/A 01/06/2012    Procedure: CAROTID ANGIOGRAM;  Surgeon: Lorretta Harp, MD;  Location: [facility] CATH LAB;  Service: Cardiovascular;  Laterality: N/A;  . Lower extremity angiogram N/A 01/06/2012    Procedure: LOWER EXTREMITY ANGIOGRAM;  Surgeon: Lorretta Harp, MD;  Location: [facility] CATH LAB;  Service: Cardiovascular;  Laterality: N/A;  . Cardiac catheterization  01/31/2005   Family History  Problem Relation Age of Onset  . Hyperlipidemia Mother   . Hypertension Mother   . Cancer Father   . Diabetes Sister   . Hypertension Sister   . Diabetes Brother   . Heart disease Brother   . Hypertension Brother   . Heart attack Brother    Social History  Substance  Use Topics  . Smoking status: Current Every Day Smoker -- 0.50 packs/day for 50 years    Types: Cigarettes  . Smokeless tobacco: Never Used     Comment: pt states that she uses nic patches  . Alcohol Use: Yes     Comment: occassionally   OB History    Gravida Para Term Preterm AB TAB SAB Ectopic Multiple Living            2     Review of Systems  Constitutional: Positive for chills. Negative for fever and diaphoresis.  HENT: Negative for sore throat.   Respiratory: Positive for cough, shortness of breath and wheezing.   Cardiovascular: Negative for chest pain, palpitations and leg swelling.  Musculoskeletal: Positive for myalgias.      Allergies  Latex; Tape; and Lipitor  Home Medications   Prior to Admission medications   Medication Sig Start Date End Date  Taking? Authorizing Provider  acetaminophen (TYLENOL) 500 MG tablet Take 1,000 mg by mouth every 6 (six) hours as needed. For pain    Historical Provider, MD  aspirin EC 325 MG tablet Take 325 mg by mouth daily.    Historical Provider, MD  atorvastatin (LIPITOR) 40 MG tablet Take 40 mg by mouth daily.    Historical Provider, MD  cilostazol (PLETAL) 100 MG tablet Take 1 tablet (100 mg total) by mouth 2 (two) times daily. 09/09/13   Serafina Mitchell, MD  clopidogrel (PLAVIX) 75 MG tablet Take 1 tablet (75 mg total) by mouth daily. 12/07/12   Lorretta Harp, MD  hydrochlorothiazide (MICROZIDE) 12.5 MG capsule Take 12.5 mg by mouth daily.    Historical Provider, MD  losartan (COZAAR) 100 MG tablet Take 100 mg by mouth daily.    Historical Provider, MD  oxyCODONE (OXY IR/ROXICODONE) 5 MG immediate release tablet Take 1 tablet (5 mg total) by mouth once as needed. 04/20/12   Ulyses Amor, PA-C   BP 133/79 mmHg  Pulse 113  Temp(Src) 98.7 F (37.1 C) (Oral)  Resp 24  Ht 5\' 7"  (1.702 m)  Wt 75.751 kg  BMI 26.15 kg/m2  SpO2 93% Physical Exam  Constitutional: She is oriented to person, place, and time. She appears well-developed and well-nourished.  Non-toxic appearance.  HENT:  Head: Normocephalic.  Right Ear: Tympanic membrane and external ear normal.  Left Ear: Tympanic membrane and external ear normal.  Eyes: EOM and lids are normal. Pupils are equal, round, and reactive to light.  Neck: Normal range of motion. Neck supple. Carotid bruit is not present.  Cardiovascular: Normal rate, regular rhythm, normal heart sounds, intact distal pulses and normal pulses.   Pulmonary/Chest: No respiratory distress. She has wheezes.  Few scattered wheezes and some rhonchi present. Patient has some mild laboring no respirations, and tachypnea of 26 breaths per minute. No retractions, or using the sensory muscles.  Abdominal: Soft. Bowel sounds are normal. There is no tenderness. There is no guarding.  No  ascites, no mass or tumor appreciated.  Musculoskeletal: Normal range of motion. She exhibits no edema.  No pitting edema of the upper or lower extremities.  Lymphadenopathy:       Head (right side): No submandibular adenopathy present.       Head (left side): No submandibular adenopathy present.    She has no cervical adenopathy.  Neurological: She is alert and oriented to person, place, and time. She has normal strength. No cranial nerve deficit or sensory deficit.  Skin: Skin is warm and  dry.  No color changes of the extremities.  Psychiatric: She has a normal mood and affect. Her speech is normal.  Nursing note and vitals reviewed.   ED Course  Oxygen level went down to 88% on room air. Pt improved on O2. No distress.  Case discussed in detail with Dr Roderic Palau.  Procedures (including critical care time) Labs Review Labs Reviewed - No data to display  Imaging Review Dg Chest 2 View  03/19/2015  CLINICAL DATA:  Nonproductive cough for the past 2 days. General myalgias. EXAM: CHEST  2 VIEW COMPARISON:  04/16/2012. FINDINGS: The cardiac silhouette is enlarged with an interval increase in size. Increased prominence of the pulmonary vasculature and interstitial markings. The lungs are hyperexpanded and no pleural fluid is seen. Diffuse peribronchial thickening is noted. Unremarkable bones. IMPRESSION: Progressive cardiomegaly with interval mild changes of congestive heart failure superimposed on COPD and chronic bronchitis. Electronically Signed   By: Claudie Revering M.D.   On: 03/19/2015 18:30   I have personally reviewed and evaluated these images and lab results as part of my medical decision-making.   EKG Interpretation None      MDM  Vital signs reviewed. Pulse oximetry is 93-96% on room air. Heart rate is elevated at 113.   The electrocardiogram shows a sinus tachycardia 119 beats a minute. There is evidence of right atrial enlargement. There is ST-T wave abnormality in the  inferior lateral area. Question of some ischemia. These changes however are unchanged from EKG of 2014. Chest x-ray shows progressive cardiomegaly with interval changes of congestive heart failure superimposed on chronic obstructive pulmonary disease and chronic bronchitis. Patient treated with albuterol and Tussionex.  Patient's oxygen level went down to 88% on room air. Patient was placed on 2 L of oxygen by nasal cannula, and improved to 93-94% on oxygen. The basic metabolic panel shows the glucose to be slightly elevated at 110, the creatinine slightly elevated at 1.03, the anion gap is normal at 11. The troponin is elevated at 0.06, the B Nat peptide is elevated at 272. Patient given aspirin, and IV Lasix.   Pt having good results from IV Lasix. Case discussed with Dr Sharren Bridge. Pt to be admitted to telemetry.   Final diagnoses:  Congestive heart failure, unspecified congestive heart failure chronicity, unspecified congestive heart failure type (HCC)    **I have reviewed nursing notes, vital signs, and all appropriate lab and imaging results for this patient.Lily Kocher, PA-C 03/20/15 1545  Milton Ferguson, MD 03/23/15 [phone]

## 2015-03-20 ENCOUNTER — Inpatient Hospital Stay (HOSPITAL_COMMUNITY): Payer: Commercial Managed Care - HMO

## 2015-03-20 DIAGNOSIS — I509 Heart failure, unspecified: Secondary | ICD-10-CM

## 2015-03-20 DIAGNOSIS — J441 Chronic obstructive pulmonary disease with (acute) exacerbation: Secondary | ICD-10-CM

## 2015-03-20 DIAGNOSIS — I5031 Acute diastolic (congestive) heart failure: Secondary | ICD-10-CM

## 2015-03-20 DIAGNOSIS — I739 Peripheral vascular disease, unspecified: Secondary | ICD-10-CM

## 2015-03-20 DIAGNOSIS — Z716 Tobacco abuse counseling: Secondary | ICD-10-CM

## 2015-03-20 DIAGNOSIS — I248 Other forms of acute ischemic heart disease: Secondary | ICD-10-CM

## 2015-03-20 DIAGNOSIS — R509 Fever, unspecified: Secondary | ICD-10-CM

## 2015-03-20 DIAGNOSIS — I10 Essential (primary) hypertension: Secondary | ICD-10-CM

## 2015-03-20 DIAGNOSIS — R000 Tachycardia, unspecified: Secondary | ICD-10-CM

## 2015-03-20 LAB — COMPREHENSIVE METABOLIC PANEL
ALT: 10 U/L — ABNORMAL LOW (ref 14–54)
AST: 18 U/L (ref 15–41)
Albumin: 3.5 g/dL (ref 3.5–5.0)
Alkaline Phosphatase: 50 U/L (ref 38–126)
Anion gap: 10 (ref 5–15)
BILIRUBIN TOTAL: 0.8 mg/dL (ref 0.3–1.2)
BUN: 16 mg/dL (ref 6–20)
CO2: 29 mmol/L (ref 22–32)
CREATININE: 0.98 mg/dL (ref 0.44–1.00)
Calcium: 8.8 mg/dL — ABNORMAL LOW (ref 8.9–10.3)
Chloride: 97 mmol/L — ABNORMAL LOW (ref 101–111)
GFR, EST NON AFRICAN AMERICAN: 58 mL/min — AB (ref >60)
Glucose, Bld: 108 mg/dL — ABNORMAL HIGH (ref 65–99)
POTASSIUM: 3.4 mmol/L — AB (ref 3.5–5.1)
Sodium: 136 mmol/L (ref 135–145)
TOTAL PROTEIN: 7.4 g/dL (ref 6.5–8.1)

## 2015-03-20 LAB — TROPONIN I
TROPONIN I: 0.06 ng/mL — AB (ref <0.031)
Troponin I: 0.06 ng/mL — ABNORMAL HIGH (ref <0.031)

## 2015-03-20 MED ORDER — ALBUTEROL SULFATE (2.5 MG/3ML) 0.083% IN NEBU
2.5000 mg | INHALATION_SOLUTION | RESPIRATORY_TRACT | Status: DC | PRN
  Administered 2015-03-20: 2.5 mg via RESPIRATORY_TRACT
  Filled 2015-03-20: qty 3

## 2015-03-20 MED ORDER — INFLUENZA VAC SPLIT QUAD 0.5 ML IM SUSY
0.5000 mL | PREFILLED_SYRINGE | INTRAMUSCULAR | Status: AC
  Administered 2015-03-21: 0.5 mL via INTRAMUSCULAR
  Filled 2015-03-20: qty 0.5

## 2015-03-20 MED ORDER — HYDROCODONE-ACETAMINOPHEN 5-325 MG PO TABS
1.0000 | ORAL_TABLET | ORAL | Status: DC | PRN
  Administered 2015-03-20 – 2015-03-21 (×2): 1 via ORAL
  Filled 2015-03-20 (×3): qty 1

## 2015-03-20 MED ORDER — DILTIAZEM HCL 30 MG PO TABS
30.0000 mg | ORAL_TABLET | Freq: Four times a day (QID) | ORAL | Status: DC
  Administered 2015-03-20 – 2015-03-21 (×3): 30 mg via ORAL
  Filled 2015-03-20 (×3): qty 1

## 2015-03-20 MED ORDER — LEVALBUTEROL HCL 0.63 MG/3ML IN NEBU
0.6300 mg | INHALATION_SOLUTION | Freq: Three times a day (TID) | RESPIRATORY_TRACT | Status: DC
  Administered 2015-03-20: 0.63 mg via RESPIRATORY_TRACT
  Filled 2015-03-20: qty 3

## 2015-03-20 MED ORDER — LEVALBUTEROL HCL 0.63 MG/3ML IN NEBU: 0.6300 mg | INHALATION_SOLUTION | Freq: Three times a day (TID) | RESPIRATORY_TRACT | Status: DC

## 2015-03-20 MED ORDER — GUAIFENESIN-CODEINE 100-10 MG/5ML PO SOLN
10.0000 mL | Freq: Four times a day (QID) | ORAL | Status: DC | PRN
  Administered 2015-03-20 (×3): 10 mL via ORAL
  Filled 2015-03-20 (×3): qty 10

## 2015-03-20 MED ORDER — LOSARTAN POTASSIUM 50 MG PO TABS
50.0000 mg | ORAL_TABLET | Freq: Every day | ORAL | Status: DC
  Administered 2015-03-21: 50 mg via ORAL
  Filled 2015-03-20: qty 1

## 2015-03-20 MED ORDER — ALBUTEROL SULFATE (2.5 MG/3ML) 0.083% IN NEBU
INHALATION_SOLUTION | RESPIRATORY_TRACT | Status: AC
  Administered 2015-03-20: 2.5 mg
  Filled 2015-03-20: qty 3

## 2015-03-20 NOTE — Care Management Important Message (Signed)
Important Message  Patient Details  Name: AYLIA SKIBA MRN: [id number] Date of Birth: 1945-06-26   Medicare Important Message Given:  Yes    Joylene Draft, RN 03/20/2015, 11:26 AM

## 2015-03-20 NOTE — Progress Notes (Signed)
Patient c/o pain r/t coughing and muscle soreness, paged on call MD, will follow any new orders received and continue to monitor.

## 2015-03-20 NOTE — Progress Notes (Signed)
lipitor not given - patient has allergy

## 2015-03-20 NOTE — Care Management Note (Signed)
Case Management Note  Patient Details  Name: Leslie [name] MRN: [id number] Date of Birth: 06-01-1945  Subjective/Objective:                  Pt admitted from home with CHF. Pt lives with family and will return home at discharge. Pt spends part of the year in [place] and part of the year in [place]. Pt has a neb machine for home use. Pt is independent with ADL's.  Action/Plan: Will need home O2 assessment prior to discharge. No other CM needs noted.  Expected Discharge Date:                  Expected Discharge Plan:  Home/Self Care  In-House Referral:  NA  Discharge planning Services  CM Consult  Post Acute Care Choice:  NA Choice offered to:  NA  DME Arranged:    DME Agency:     HH Arranged:    HH Agency:     Status of Service:  Completed, signed off  Medicare Important Message Given:  Yes Date Medicare IM Given:    Medicare IM give by:    Date Additional Medicare IM Given:    Additional Medicare Important Message give by:     If discussed at [place] of Stay Meetings, dates discussed:    Additional Comments:  Joylene Draft, RN 03/20/2015, 11:27 AM

## 2015-03-20 NOTE — Progress Notes (Signed)
Subjective: Patient was admitted yesterday due to cough, congestion and shortness of breath. Patient is a tobacco smoker with history of chronic bronchitis. Her chest x-ray shows cardiomegally with sign of CHF and COPD. No chest pain. Patient is on IV diuretics and nebulizer treatment. ECHO and cardiology consult are requested.  Objective: Vital signs in last 24 hours: Temp:  [98.7 F (37.1 C)-99.7 F (37.6 C)] 98.7 F (37.1 C) (12/30 0519) Pulse Rate:  [105-113] 105 (12/30 0519) Resp:  [20-24] 20 (12/30 0519) BP: (108-143)/(61-79) 131/61 mmHg (12/30 0519) SpO2:  [93 %-99 %] 99 % (12/30 0519) Weight:  [74.345 kg (163 lb 14.4 oz)-75.751 kg (167 lb)] 74.345 kg (163 lb 14.4 oz) (12/30 0542) Weight change:  Last BM Date: 03/19/15  Intake/Output from previous day:    PHYSICAL EXAM General appearance: alert, cooperative and no distress Resp: diminished breath sounds bilaterally and rhonchi bilaterally Cardio: S1, S2 normal GI: soft, non-tender; bowel sounds normal; no masses,  no organomegaly Extremities: extremities normal, atraumatic, no cyanosis or edema  Lab Results:  Results for orders placed or performed during the hospital encounter of 03/19/15 (from the past 48 hour(s))  Basic metabolic panel     Status: Abnormal   Collection Time: 03/19/15  7:50 PM  Result Value Ref Range   Sodium 136 135 - 145 mmol/L   Potassium 3.9 3.5 - 5.1 mmol/L   Chloride 96 (L) 101 - 111 mmol/L   CO2 29 22 - 32 mmol/L   Glucose, Bld 110 (H) 65 - 99 mg/dL   BUN 15 6 - 20 mg/dL   Creatinine, Ser 1.03 (H) 0.44 - 1.00 mg/dL   Calcium 9.1 8.9 - 10.3 mg/dL   GFR calc non Af Amer 55 (L) >60 mL/min   GFR calc Af Amer >60 >60 mL/min    Comment: (NOTE) The eGFR has been calculated using the CKD EPI equation. This calculation has not been validated in all clinical situations. eGFR's persistently <60 mL/min signify possible Chronic Kidney Disease.    Anion gap 11 5 - 15  Troponin I     Status: Abnormal    Collection Time: 03/19/15  7:50 PM  Result Value Ref Range   Troponin I 0.06 (H) <0.031 ng/mL    Comment:        PERSISTENTLY INCREASED TROPONIN VALUES IN THE RANGE OF 0.04-0.49 ng/mL CAN BE SEEN IN:       -UNSTABLE ANGINA       -CONGESTIVE HEART FAILURE       -MYOCARDITIS       -CHEST TRAUMA       -ARRYHTHMIAS       -LATE PRESENTING MYOCARDIAL INFARCTION       -COPD   CLINICAL FOLLOW-UP RECOMMENDED.   Brain natriuretic peptide     Status: Abnormal   Collection Time: 03/19/15  7:50 PM  Result Value Ref Range   B Natriuretic Peptide 272.0 (H) 0.0 - 100.0 pg/mL  TSH     Status: None   Collection Time: 03/19/15  7:50 PM  Result Value Ref Range   TSH 0.530 0.350 - 4.500 uIU/mL  Troponin I     Status: Abnormal   Collection Time: 03/19/15 10:09 PM  Result Value Ref Range   Troponin I 0.06 (H) <0.031 ng/mL    Comment:        PERSISTENTLY INCREASED TROPONIN VALUES IN THE RANGE OF 0.04-0.49 ng/mL CAN BE SEEN IN:       -UNSTABLE ANGINA       -  CONGESTIVE HEART FAILURE       -MYOCARDITIS       -CHEST TRAUMA       -ARRYHTHMIAS       -LATE PRESENTING MYOCARDIAL INFARCTION       -COPD   CLINICAL FOLLOW-UP RECOMMENDED.   CBC     Status: Abnormal   Collection Time: 03/19/15 10:09 PM  Result Value Ref Range   WBC 5.6 4.0 - 10.5 K/uL   RBC 4.38 3.87 - 5.11 MIL/uL   Hemoglobin 11.8 (L) 12.0 - 15.0 g/dL   HCT 37.1 36.0 - 46.0 %   MCV 84.7 78.0 - 100.0 fL   MCH 26.9 26.0 - 34.0 pg   MCHC 31.8 30.0 - 36.0 g/dL   RDW 15.6 (H) 11.5 - 15.5 %   Platelets 210 150 - 400 K/uL  Troponin I     Status: Abnormal   Collection Time: 03/20/15  3:35 AM  Result Value Ref Range   Troponin I 0.06 (H) <0.031 ng/mL    Comment:        PERSISTENTLY INCREASED TROPONIN VALUES IN THE RANGE OF 0.04-0.49 ng/mL CAN BE SEEN IN:       -UNSTABLE ANGINA       -CONGESTIVE HEART FAILURE       -MYOCARDITIS       -CHEST TRAUMA       -ARRYHTHMIAS       -LATE PRESENTING MYOCARDIAL INFARCTION       -COPD    CLINICAL FOLLOW-UP RECOMMENDED.   Comprehensive metabolic panel     Status: Abnormal   Collection Time: 03/20/15  3:35 AM  Result Value Ref Range   Sodium 136 135 - 145 mmol/L   Potassium 3.4 (L) 3.5 - 5.1 mmol/L   Chloride 97 (L) 101 - 111 mmol/L   CO2 29 22 - 32 mmol/L   Glucose, Bld 108 (H) 65 - 99 mg/dL   BUN 16 6 - 20 mg/dL   Creatinine, Ser 0.98 0.44 - 1.00 mg/dL   Calcium 8.8 (L) 8.9 - 10.3 mg/dL   Total Protein 7.4 6.5 - 8.1 g/dL   Albumin 3.5 3.5 - 5.0 g/dL   AST 18 15 - 41 U/L   ALT 10 (L) 14 - 54 U/L   Alkaline Phosphatase 50 38 - 126 U/L   Total Bilirubin 0.8 0.3 - 1.2 mg/dL   GFR calc non Af Amer 58 (L) >60 mL/min   GFR calc Af Amer >60 >60 mL/min    Comment: (NOTE) The eGFR has been calculated using the CKD EPI equation. This calculation has not been validated in all clinical situations. eGFR's persistently <60 mL/min signify possible Chronic Kidney Disease.    Anion gap 10 5 - 15    ABGS No results for input(s): PHART, PO2ART, TCO2, HCO3 in the last 72 hours.  Invalid input(s): PCO2 CULTURES No results found for this or any previous visit (from the past 240 hour(s)). Studies/Results: Dg Chest 2 View  03/19/2015  CLINICAL DATA:  Nonproductive cough for the past 2 days. General myalgias. EXAM: CHEST  2 VIEW COMPARISON:  04/16/2012. FINDINGS: The cardiac silhouette is enlarged with an interval increase in size. Increased prominence of the pulmonary vasculature and interstitial markings. The lungs are hyperexpanded and no pleural fluid is seen. Diffuse peribronchial thickening is noted. Unremarkable bones. IMPRESSION: Progressive cardiomegaly with interval mild changes of congestive heart failure superimposed on COPD and chronic bronchitis. Electronically Signed   By: Claudie Revering M.D.   On: 03/19/2015  18:30    Medications: I have reviewed the patient's current medications.  Assesment:   Active Problems:   CHF (congestive heart failure) (HCC)   Hypertension    Peripheral vascular disease (HCC)   Congestive heart failure (CHF) (HCC) probably COPD Nicotine dependence    Plan:  Medications reviewed ECHO  Cardiology consult Continue nebulizer Continue IV diuretics for now.    LOS: 1 day   Harl Wiechmann 03/20/2015, 8:19 AM

## 2015-03-20 NOTE — Consult Note (Signed)
CARDIOLOGY CONSULT NOTE   Patient ID: DEAUNDRA SCHRAG MRN: [id number] DOB/AGE: 68-15-1947 68 y.o.  Admit Date: 03/19/2015 Referring Physician: Rosita Fire MD Primary Physician: Rosita Fire, MD Consulting Cardiologist: Kate Sable MD Primary Cardiologist Quay Burow MD Reason for Consultation: CHF  Clinical Summary Ms. Cosentino is a 69 y.o.female with known history of HTN & PAD.69 y/o woman with h/p HTN & PAD. She was essentially lost to f/u with Dr. Gwenlyn Found after LE PTCA/Stent 2008, carotid artery stenosis s/p right carotid endarterectomy in 2014,  hypertension, hypercholesterolemia who presented to ER with complaints of worsening dyspnea, cough and congestion.    IN ER BP 133/79, HR 113 bpm, O2 Sat 96%, Troponin 0.06, Creatinine 1.03. CXR demonstrated CHF, with COPD. She was treated with albuterol, lasix, and placed back on her medications.  EKG, ST with LVH and ST depression laterally likely rate  related.   She states that she has had worsening breathing and fatigue for several days, but the coughing was most worrisome. She had some edema in her hands and legs as well. Daughter at bedside states that she snores loudly and does stop breathing while sleeping.   Allergies  Allergen Reactions  . Latex Other (See Comments)    Burns skin   . Tape Other (See Comments)    Burns skin   . Lipitor [Atorvastatin] Other (See Comments)    myalgia    Medications Scheduled Medications: . aspirin EC  325 mg Oral Daily  . atorvastatin  40 mg Oral q1800  . cilostazol  100 mg Oral BID  . clopidogrel  75 mg Oral Daily  . diltiazem  30 mg Oral 4 times per day  . enoxaparin (LOVENOX) injection  40 mg Subcutaneous Q24H  . furosemide  40 mg Intravenous Q12H  . hydrochlorothiazide  12.5 mg Oral Daily  . [START ON 03/21/2015] Influenza vac split quadrivalent PF  0.5 mL Intramuscular Tomorrow-1000  . levalbuterol  0.63 mg Nebulization Q8H  . [START ON 03/21/2015] losartan  50 mg Oral  Daily  . sodium chloride  3 mL Intravenous Q12H    Infusions:    PRN Medications: acetaminophen, guaiFENesin-codeine, ondansetron (ZOFRAN) IV   Past Medical History  Diagnosis Date  . PAD (peripheral artery disease) ([place])   . Tobacco abuse   . Rheumatoid arthritis(714.0)   . Carotid artery occlusion   . Hyperlipidemia     takes Atorvastatin daily  . HTN (hypertension), benign     takes Losartan daily  . Cough     phlem but no odor noted  . Pneumonia     several yrs ago  . History of migraine     last one at least 69months ago  . Back pain     DDD  . Dry skin   . Bruises easily     pt attributes this to Plavix and ASA  . History of colon polyps   . Panic attack     on the 29-Apr-2022 Granddaughter died(64months)  . Coronary artery disease     no definite diagnosis found as of 04/17/12; no ischemia, EF 48% by Myoview stress 12/29/11 (Dr. Gwenlyn Found)  . History of bruising easily     Past Surgical History  Procedure Laterality Date  . Back surgery    . Leg stents    . Total knee arthroplasty    . Abdominal hysterectomy    . Ulnar nerve surgery    . Carpal tunnel release    . Trigger finger  release    . Cholecystectomy    . Coronary angioplasty  01/06/12/2005/2006     2 stents  . Givens capsule study    . Colonoscopy    . Esophagogastroduodenoscopy    . Endarterectomy  04/20/2012    Procedure: ENDARTERECTOMY CAROTID;  Surgeon: Serafina Mitchell, MD;  Location: [facility];  Service: Vascular;  Laterality: Right;  . Angioplasty  04/20/2012    Procedure: ANGIOPLASTY;  Surgeon: Serafina Mitchell, MD;  Location: [facility] OR;  Service: Vascular;  Laterality: Right;  Using 1cm x6 cm vascu-guard patch   . Carotid angiogram N/A 01/06/2012    Procedure: CAROTID ANGIOGRAM;  Surgeon: Lorretta Harp, MD;  Location: [facility] CATH LAB;  Service: Cardiovascular;  Laterality: N/A;  . Lower extremity angiogram N/A 01/06/2012    Procedure: LOWER EXTREMITY ANGIOGRAM;  Surgeon: Lorretta Harp, MD;   Location[facility] LAB;  Service: Cardiovascular;  Laterality: N/A;  . Cardiac catheterization  01/31/2005    Family History  Problem Relation Age of Onset  . Hyperlipidemia Mother   . Hypertension Mother   . Cancer Father   . Diabetes Sister   . Hypertension Sister   . Diabetes Brother   . Heart disease Brother   . Hypertension Brother   . Heart attack Brother     Social History Ms. Protsman reports that she has been smoking Cigarettes.  She has a 25 pack-year smoking history. She has never used smokeless tobacco. Ms. Kret reports that she drinks alcohol.  Review of Systems Complete review of systems are found to be negative unless outlined in H&P above.  Physical Examination Blood pressure 128/91, pulse 125, temperature 102.1 F (38.9 C), temperature source Oral, resp. rate 24, height 5\' 7"  (1.702 m), weight 163 lb 14.4 oz (74.345 kg), SpO2 90 %.  Intake/Output Summary (Last 24 hours) at 03/20/15 1638 Last data filed at 03/20/15 1300  Gross per 24 hour  Intake    483 ml  Output      0 ml  Net    483 ml    Telemetry: Sinus tachycardia.   GEN: No acute distress HEENT: Conjunctiva and lids normal, oropharynx clear with moist mucosa. Neck: Supple, no elevated JVP or carotid bruits, no thyromegaly.Well healed carotid incision on the right.  Lungs: Crackles in the LLL, no wheezes.  Cardiac: Regular rate and rhythm, tachycardic,  Course 2/6 systolic murmur, apex with radiation to the left and right.  Abdomen: Soft, nontender, no hepatomegaly, bowel sounds present, no guarding or rebound. Extremities: No pitting edema, distal pulses 2+. Skin: Warm and dry. Musculoskeletal: No kyphosis. Neuropsychiatric: Alert and oriented x3, affect grossly appropriate.  Prior Cardiac Testing/Procedures 1. Echocardiogram 03/20/2015 Left ventricle: The cavity size was normal. Systolic function was vigorous. The estimated ejection fraction was in the range of 75% to 80%. Doppler  parameters are consistent with abnormal left ventricular relaxation (grade 1 diastolic dysfunction). Moderate concentric and severe focal basal septal hypertrophy. - Mitral valve: Calcified annulus. Mildly thickened leaflets . There was mild to moderate regurgitation. - Left atrium: The atrium was moderately dilated. - Tricuspid valve: There was mild regurgitation. - Pulmonary arteries: Systolic pressure was mildly increased. PA peak pressure: 37 mm Hg (S).  2. Aortagram with Run Off. 08/03/2014 ANGIOGRAPHIC RESULTS: 1. Abdominal aorta:  1. Renal arteries: Normal.  2. Infrarenal abdominal aorta: Moderate atherosclerotic changes. 2. Left lower extremity:  1. 50 to 60% segmental mid left SFA stenosis with three vessel runoff. 3. Right lower extremity:  1.  Diffuse in-stent restenosis with occlusion of the overlapping mid  right SFA stents with three vessel runoff. Successful fox hollow atherectomy, percutaneous transluminal angioplasty and stenting of a diffuse in-stent restenosis for functional limiting claudication.  Lab Results  Basic Metabolic Panel:  Recent Labs Lab 03/19/15 1950 03/20/15 0335  NA 136 136  K 3.9 3.4*  CL 96* 97*  CO2 29 29  GLUCOSE 110* 108*  BUN 15 16  CREATININE 1.03* 0.98  CALCIUM 9.1 8.8*    Liver Function Tests:  Recent Labs Lab 03/20/15 0335  AST 18  ALT 10*  ALKPHOS 50  BILITOT 0.8  PROT 7.4  ALBUMIN 3.5    CBC:  Recent Labs Lab 03/19/15 2209  WBC 5.6  HGB 11.8*  HCT 37.1  MCV 84.7  PLT 210    Cardiac Enzymes:  Recent Labs Lab 03/19/15 1950 03/19/15 2209 03/20/15 0335 03/20/15 0951  TROPONINI 0.06* 0.06* 0.06* 0.06*    Radiology: Dg Chest 2 View  03/19/2015  CLINICAL DATA:  Nonproductive cough for the past 2 days. General myalgias. EXAM: CHEST  2 VIEW COMPARISON:  04/16/2012. FINDINGS: The cardiac silhouette is enlarged with an interval increase in size. Increased  prominence of the pulmonary vasculature and interstitial markings. The lungs are hyperexpanded and no pleural fluid is seen. Diffuse peribronchial thickening is noted. Unremarkable bones. IMPRESSION: Progressive cardiomegaly with interval mild changes of congestive heart failure superimposed on COPD and chronic bronchitis. Electronically Signed   By: Claudie Revering M.D.   On: 03/19/2015 18:30     ECG: Sinus tachycardia with LVH.    Impression and Recommendations  1. Acute on Chronic Diastolic CHF: She is breathing and feeling better, but she has not diuresed a considerable amount on lasix. No overt edema. Creatinine 0.98. Replace potassium Will stop lasix.  2. Tachycardia: Will slow HR for better cardiac output and perfusion. Will begin diltiazem 30 mg Q 6 hours, decrease losartan to 50 mg, to avoid hypotension.  Avoiding BB for now due to COPD. Change albuterol to Xopenex as this has less affect on HR.   3. COPD: She is a longtime smoker, over 50 years. She is on inhalers. May need to have pulmonology see her as OP.   4. Febrile: Await influenza result. She did not get a flu shot.    Signed: Phill Myron. Lawrence NP [place]  03/20/2015, 4:38 PM Co-Sign MD  The patient was seen and examined, and I agree with the assessment and plan as documented above, with modifications as noted below.  Pt with long h/o tobacco abuse, right CEA, and lower extremity PVD with angioplasty and stenting admitted with acute respiratory infection with probable COPD exacerbation and a very mild degree of diastolic CHF (BNP only [id number]). Troponin elevation consistent with demand ischemia (very low level and flat curve). Tachycardic which is likely being driven by acute illness and beta agonists. Will temporarily provide diltiazem to slow HR. Await influenza result. LVH with consequent repolarization abnormalities seen on ECG. No need for further diuresis.  Kate Sable, MD, [facility]  03/20/2015 4:48 PM

## 2015-03-21 DIAGNOSIS — E785 Hyperlipidemia, unspecified: Secondary | ICD-10-CM | POA: Diagnosis not present

## 2015-03-21 DIAGNOSIS — M069 Rheumatoid arthritis, unspecified: Secondary | ICD-10-CM | POA: Diagnosis not present

## 2015-03-21 DIAGNOSIS — I739 Peripheral vascular disease, unspecified: Secondary | ICD-10-CM | POA: Diagnosis not present

## 2015-03-21 DIAGNOSIS — F17210 Nicotine dependence, cigarettes, uncomplicated: Secondary | ICD-10-CM | POA: Diagnosis not present

## 2015-03-21 DIAGNOSIS — I5033 Acute on chronic diastolic (congestive) heart failure: Secondary | ICD-10-CM | POA: Diagnosis not present

## 2015-03-21 DIAGNOSIS — I110 Hypertensive heart disease with heart failure: Secondary | ICD-10-CM | POA: Diagnosis not present

## 2015-03-21 DIAGNOSIS — I2510 Atherosclerotic heart disease of native coronary artery without angina pectoris: Secondary | ICD-10-CM | POA: Diagnosis not present

## 2015-03-21 DIAGNOSIS — E7800 Pure hypercholesterolemia, unspecified: Secondary | ICD-10-CM | POA: Diagnosis not present

## 2015-03-21 DIAGNOSIS — J441 Chronic obstructive pulmonary disease with (acute) exacerbation: Secondary | ICD-10-CM | POA: Diagnosis not present

## 2015-03-21 MED ORDER — DILTIAZEM HCL 30 MG PO TABS: 30.0000 mg | ORAL_TABLET | Freq: Four times a day (QID) | ORAL | Status: DC

## 2015-03-21 NOTE — Progress Notes (Signed)
Patient staes understanding of discharge instructions 

## 2015-03-24 DIAGNOSIS — J449 Chronic obstructive pulmonary disease, unspecified: Secondary | ICD-10-CM | POA: Diagnosis not present

## 2015-03-24 DIAGNOSIS — I2510 Atherosclerotic heart disease of native coronary artery without angina pectoris: Secondary | ICD-10-CM | POA: Diagnosis not present

## 2015-03-24 DIAGNOSIS — I10 Essential (primary) hypertension: Secondary | ICD-10-CM | POA: Diagnosis not present

## 2015-04-06 NOTE — Discharge Summary (Signed)
Physician Discharge Summary  Patient ID: Leslie [name] MRN: [id number] DOB/AGE: 69/11/1945 69 y.o. Primary Care Physician:Karnisha Lefebre, MD Admit date: 03/19/2015 Discharge date: 03/21/2015   Discharge Diagnoses:   Active Problems:   CHF (congestive heart failure) (HCC)   Hypertension   Peripheral vascular disease (HCC)   Congestive heart failure (CHF) (HCC) Acute on chronic diastolic CHF COPD with exacerbation   Medication List    TAKE these medications        acetaminophen 500 MG tablet  Commonly known as:  TYLENOL  Take 1,000 mg by mouth every 6 (six) hours as needed. For pain     aspirin EC 325 MG tablet  Take 325 mg by mouth daily.     cilostazol 100 MG tablet  Commonly known as:  PLETAL  Take 1 tablet (100 mg total) by mouth 2 (two) times daily.     diltiazem 30 MG tablet  Commonly known as:  CARDIZEM  Take 1 tablet (30 mg total) by mouth every 6 (six) hours.     guaiFENesin-codeine 100-10 MG/5ML syrup  Commonly known as:  ROBITUSSIN AC  Take 5 mLs by mouth 3 (three) times daily as needed for cough.     losartan 25 MG tablet  Commonly known as:  COZAAR  Take 25 mg by mouth daily.     omeprazole 20 MG capsule  Commonly known as:  PRILOSEC  Take 20 mg by mouth daily.     rosuvastatin 10 MG tablet  Commonly known as:  CRESTOR  Take 10 mg by mouth daily.        Discharged Condition: improved    Consults: cardiology  Significant Diagnostic Studies: Dg Chest 2 View  03/19/2015  CLINICAL DATA:  Nonproductive cough for the past 2 days. General myalgias. EXAM: CHEST  2 VIEW COMPARISON:  04/16/2012. FINDINGS: The cardiac silhouette is enlarged with an interval increase in size. Increased prominence of the pulmonary vasculature and interstitial markings. The lungs are hyperexpanded and no pleural fluid is seen. Diffuse peribronchial thickening is noted. Unremarkable bones. IMPRESSION: Progressive cardiomegaly with interval mild changes of congestive heart  failure superimposed on COPD and chronic bronchitis. Electronically Signed   By: Claudie Revering M.D.   On: 03/19/2015 18:30    Lab Results: Basic Metabolic Panel: No results for input(s): NA, K, CL, CO2, GLUCOSE, BUN, CREATININE, CALCIUM, MG, PHOS in the last 72 hours. Liver Function Tests: No results for input(s): AST, ALT, ALKPHOS, BILITOT, PROT, ALBUMIN in the last 72 hours.   CBC: No results for input(s): WBC, NEUTROABS, HGB, HCT, MCV, PLT in the last 72 hours.  No results found for this or any previous visit (from the past 240 hour(s)).   Hospital Course:   This is a 70 years old female with history of multiple medical illnesses was admitted to shortness of breath. Patient was found to have an acute on chronic diastolic CHF. Patient was evaluated by cardiology and she was diuresed. Patient is also known case of COPD and she had component COPD exacerbation. Patient improved and discharged in stable condition.  Discharge Exam: Blood pressure 112/59, pulse 84, temperature 98.5 F (36.9 C), temperature source Oral, resp. rate 20, height 5\' 7"  (1.702 m), weight 74.617 kg (164 lb 8 oz), SpO2 98 %.   Disposition:  home        Follow-up Information    Follow up with [facility], MD In 1 week.   Specialty:  Internal Medicine   Contact information:   [address]  [place] [address] [phone]       Signed: Dulse Rutan   04/06/2015, 8:09 AM

## 2015-05-23 DIAGNOSIS — M5431 Sciatica, right side: Secondary | ICD-10-CM | POA: Diagnosis not present

## 2015-05-23 DIAGNOSIS — E7800 Pure hypercholesterolemia, unspecified: Secondary | ICD-10-CM | POA: Diagnosis not present

## 2015-05-23 DIAGNOSIS — M545 Low back pain: Secondary | ICD-10-CM | POA: Diagnosis not present

## 2015-05-23 DIAGNOSIS — I10 Essential (primary) hypertension: Secondary | ICD-10-CM | POA: Diagnosis not present

## 2015-05-23 DIAGNOSIS — I2510 Atherosclerotic heart disease of native coronary artery without angina pectoris: Secondary | ICD-10-CM | POA: Diagnosis not present

## 2015-05-30 DIAGNOSIS — M25561 Pain in right knee: Secondary | ICD-10-CM | POA: Diagnosis not present

## 2015-05-30 DIAGNOSIS — M25461 Effusion, right knee: Secondary | ICD-10-CM | POA: Diagnosis not present

## 2015-06-03 DIAGNOSIS — M25561 Pain in right knee: Secondary | ICD-10-CM | POA: Diagnosis not present

## 2015-06-09 DIAGNOSIS — Z720 Tobacco use: Secondary | ICD-10-CM | POA: Diagnosis not present

## 2015-06-09 DIAGNOSIS — M25569 Pain in unspecified knee: Secondary | ICD-10-CM | POA: Diagnosis not present

## 2015-06-12 DIAGNOSIS — M25561 Pain in right knee: Secondary | ICD-10-CM | POA: Diagnosis not present

## 2015-06-22 DIAGNOSIS — M25561 Pain in right knee: Secondary | ICD-10-CM | POA: Diagnosis not present

## 2015-07-03 DIAGNOSIS — M25561 Pain in right knee: Secondary | ICD-10-CM | POA: Diagnosis not present

## 2015-07-16 DIAGNOSIS — R760 Raised antibody titer: Secondary | ICD-10-CM | POA: Diagnosis not present

## 2015-07-30 DIAGNOSIS — M25561 Pain in right knee: Secondary | ICD-10-CM | POA: Diagnosis not present

## 2016-04-01 DIAGNOSIS — J45909 Unspecified asthma, uncomplicated: Secondary | ICD-10-CM | POA: Diagnosis not present

## 2016-04-01 DIAGNOSIS — J209 Acute bronchitis, unspecified: Secondary | ICD-10-CM | POA: Diagnosis not present

## 2016-04-03 DIAGNOSIS — J188 Other pneumonia, unspecified organism: Secondary | ICD-10-CM | POA: Diagnosis not present

## 2016-04-14 DIAGNOSIS — Z1159 Encounter for screening for other viral diseases: Secondary | ICD-10-CM | POA: Diagnosis not present

## 2016-04-14 DIAGNOSIS — I739 Peripheral vascular disease, unspecified: Secondary | ICD-10-CM | POA: Diagnosis not present

## 2016-04-14 DIAGNOSIS — E7800 Pure hypercholesterolemia, unspecified: Secondary | ICD-10-CM | POA: Diagnosis not present

## 2016-04-14 DIAGNOSIS — Z114 Encounter for screening for human immunodeficiency virus [HIV]: Secondary | ICD-10-CM | POA: Diagnosis not present

## 2016-04-14 DIAGNOSIS — I10 Essential (primary) hypertension: Secondary | ICD-10-CM | POA: Diagnosis not present

## 2016-04-14 DIAGNOSIS — Z8679 Personal history of other diseases of the circulatory system: Secondary | ICD-10-CM | POA: Diagnosis not present

## 2016-04-14 DIAGNOSIS — M1711 Unilateral primary osteoarthritis, right knee: Secondary | ICD-10-CM | POA: Diagnosis not present

## 2016-04-14 DIAGNOSIS — I509 Heart failure, unspecified: Secondary | ICD-10-CM | POA: Diagnosis not present

## 2016-04-14 DIAGNOSIS — Z720 Tobacco use: Secondary | ICD-10-CM | POA: Diagnosis not present

## 2016-04-21 DIAGNOSIS — J189 Pneumonia, unspecified organism: Secondary | ICD-10-CM | POA: Diagnosis not present

## 2016-04-21 DIAGNOSIS — I10 Essential (primary) hypertension: Secondary | ICD-10-CM | POA: Diagnosis not present

## 2016-04-21 DIAGNOSIS — I517 Cardiomegaly: Secondary | ICD-10-CM | POA: Diagnosis not present

## 2016-04-21 DIAGNOSIS — Z720 Tobacco use: Secondary | ICD-10-CM | POA: Diagnosis not present

## 2016-04-21 DIAGNOSIS — R946 Abnormal results of thyroid function studies: Secondary | ICD-10-CM | POA: Diagnosis not present

## 2016-04-21 DIAGNOSIS — Z23 Encounter for immunization: Secondary | ICD-10-CM | POA: Diagnosis not present

## 2016-04-21 DIAGNOSIS — E7800 Pure hypercholesterolemia, unspecified: Secondary | ICD-10-CM | POA: Diagnosis not present

## 2016-04-21 DIAGNOSIS — M1711 Unilateral primary osteoarthritis, right knee: Secondary | ICD-10-CM | POA: Diagnosis not present

## 2016-04-21 DIAGNOSIS — J9801 Acute bronchospasm: Secondary | ICD-10-CM | POA: Diagnosis not present

## 2016-04-26 DIAGNOSIS — I739 Peripheral vascular disease, unspecified: Secondary | ICD-10-CM | POA: Diagnosis not present

## 2016-04-26 DIAGNOSIS — Z720 Tobacco use: Secondary | ICD-10-CM | POA: Diagnosis not present

## 2016-04-26 DIAGNOSIS — I517 Cardiomegaly: Secondary | ICD-10-CM | POA: Diagnosis not present

## 2016-04-26 DIAGNOSIS — I5032 Chronic diastolic (congestive) heart failure: Secondary | ICD-10-CM | POA: Diagnosis not present

## 2016-04-26 DIAGNOSIS — Z8679 Personal history of other diseases of the circulatory system: Secondary | ICD-10-CM | POA: Diagnosis not present

## 2016-04-26 DIAGNOSIS — F17200 Nicotine dependence, unspecified, uncomplicated: Secondary | ICD-10-CM | POA: Diagnosis not present

## 2016-04-26 DIAGNOSIS — I10 Essential (primary) hypertension: Secondary | ICD-10-CM | POA: Diagnosis not present

## 2016-04-26 DIAGNOSIS — E7800 Pure hypercholesterolemia, unspecified: Secondary | ICD-10-CM | POA: Diagnosis not present

## 2016-04-26 DIAGNOSIS — N183 Chronic kidney disease, stage 3 (moderate): Secondary | ICD-10-CM | POA: Diagnosis not present

## 2016-04-26 DIAGNOSIS — I701 Atherosclerosis of renal artery: Secondary | ICD-10-CM | POA: Diagnosis not present

## 2016-04-27 DIAGNOSIS — G8929 Other chronic pain: Secondary | ICD-10-CM | POA: Diagnosis not present

## 2016-04-27 DIAGNOSIS — M1711 Unilateral primary osteoarthritis, right knee: Secondary | ICD-10-CM | POA: Diagnosis not present

## 2016-04-27 DIAGNOSIS — M25561 Pain in right knee: Secondary | ICD-10-CM | POA: Diagnosis not present

## 2016-05-04 DIAGNOSIS — R0602 Shortness of breath: Secondary | ICD-10-CM | POA: Diagnosis not present

## 2016-05-04 DIAGNOSIS — I739 Peripheral vascular disease, unspecified: Secondary | ICD-10-CM | POA: Diagnosis not present

## 2016-05-04 DIAGNOSIS — R69 Illness, unspecified: Secondary | ICD-10-CM | POA: Diagnosis not present

## 2016-05-05 DIAGNOSIS — Z13820 Encounter for screening for osteoporosis: Secondary | ICD-10-CM | POA: Diagnosis not present

## 2016-05-05 DIAGNOSIS — I10 Essential (primary) hypertension: Secondary | ICD-10-CM | POA: Diagnosis not present

## 2016-05-05 DIAGNOSIS — Z1231 Encounter for screening mammogram for malignant neoplasm of breast: Secondary | ICD-10-CM | POA: Diagnosis not present

## 2016-05-05 DIAGNOSIS — Z1211 Encounter for screening for malignant neoplasm of colon: Secondary | ICD-10-CM | POA: Diagnosis not present

## 2016-05-10 DIAGNOSIS — I2510 Atherosclerotic heart disease of native coronary artery without angina pectoris: Secondary | ICD-10-CM | POA: Diagnosis not present

## 2016-05-10 DIAGNOSIS — I739 Peripheral vascular disease, unspecified: Secondary | ICD-10-CM | POA: Diagnosis not present

## 2016-05-26 DIAGNOSIS — Z87898 Personal history of other specified conditions: Secondary | ICD-10-CM | POA: Diagnosis not present

## 2016-05-26 DIAGNOSIS — E785 Hyperlipidemia, unspecified: Secondary | ICD-10-CM | POA: Diagnosis not present

## 2016-05-27 DIAGNOSIS — D123 Benign neoplasm of transverse colon: Secondary | ICD-10-CM | POA: Diagnosis not present

## 2016-05-27 DIAGNOSIS — D125 Benign neoplasm of sigmoid colon: Secondary | ICD-10-CM | POA: Diagnosis not present

## 2016-05-27 DIAGNOSIS — Z1212 Encounter for screening for malignant neoplasm of rectum: Secondary | ICD-10-CM | POA: Diagnosis not present

## 2016-05-27 DIAGNOSIS — D126 Benign neoplasm of colon, unspecified: Secondary | ICD-10-CM | POA: Diagnosis not present

## 2016-05-27 DIAGNOSIS — K635 Polyp of colon: Secondary | ICD-10-CM | POA: Diagnosis not present

## 2016-05-27 DIAGNOSIS — D124 Benign neoplasm of descending colon: Secondary | ICD-10-CM | POA: Diagnosis not present

## 2016-05-27 DIAGNOSIS — Z1211 Encounter for screening for malignant neoplasm of colon: Secondary | ICD-10-CM | POA: Diagnosis not present

## 2016-05-27 DIAGNOSIS — K649 Unspecified hemorrhoids: Secondary | ICD-10-CM | POA: Diagnosis not present

## 2016-05-27 DIAGNOSIS — D122 Benign neoplasm of ascending colon: Secondary | ICD-10-CM | POA: Diagnosis not present

## 2016-06-08 DIAGNOSIS — J069 Acute upper respiratory infection, unspecified: Secondary | ICD-10-CM | POA: Diagnosis not present

## 2016-06-08 DIAGNOSIS — Z720 Tobacco use: Secondary | ICD-10-CM | POA: Diagnosis not present

## 2016-06-08 DIAGNOSIS — J40 Bronchitis, not specified as acute or chronic: Secondary | ICD-10-CM | POA: Diagnosis not present

## 2016-09-15 DIAGNOSIS — Z23 Encounter for immunization: Secondary | ICD-10-CM | POA: Diagnosis not present

## 2016-09-15 DIAGNOSIS — F5101 Primary insomnia: Secondary | ICD-10-CM | POA: Diagnosis not present

## 2016-09-15 DIAGNOSIS — E7800 Pure hypercholesterolemia, unspecified: Secondary | ICD-10-CM | POA: Diagnosis not present

## 2016-09-15 DIAGNOSIS — N183 Chronic kidney disease, stage 3 (moderate): Secondary | ICD-10-CM | POA: Diagnosis not present

## 2016-09-15 DIAGNOSIS — Z0000 Encounter for general adult medical examination without abnormal findings: Secondary | ICD-10-CM | POA: Diagnosis not present

## 2017-03-17 DIAGNOSIS — R0603 Acute respiratory distress: Secondary | ICD-10-CM | POA: Diagnosis not present

## 2017-03-17 DIAGNOSIS — I214 Non-ST elevation (NSTEMI) myocardial infarction: Secondary | ICD-10-CM | POA: Diagnosis not present

## 2017-03-17 DIAGNOSIS — R079 Chest pain, unspecified: Secondary | ICD-10-CM | POA: Diagnosis not present

## 2017-03-17 DIAGNOSIS — J101 Influenza due to other identified influenza virus with other respiratory manifestations: Secondary | ICD-10-CM | POA: Diagnosis not present

## 2017-03-17 DIAGNOSIS — R69 Illness, unspecified: Secondary | ICD-10-CM | POA: Diagnosis not present

## 2017-03-17 DIAGNOSIS — N183 Chronic kidney disease, stage 3 (moderate): Secondary | ICD-10-CM | POA: Diagnosis not present

## 2017-03-17 DIAGNOSIS — I471 Supraventricular tachycardia: Secondary | ICD-10-CM | POA: Diagnosis not present

## 2017-03-17 DIAGNOSIS — R0602 Shortness of breath: Secondary | ICD-10-CM | POA: Diagnosis not present

## 2017-03-17 DIAGNOSIS — K921 Melena: Secondary | ICD-10-CM | POA: Diagnosis not present

## 2017-03-17 DIAGNOSIS — A4189 Other specified sepsis: Secondary | ICD-10-CM | POA: Diagnosis not present

## 2017-03-17 DIAGNOSIS — I10 Essential (primary) hypertension: Secondary | ICD-10-CM | POA: Diagnosis not present

## 2017-03-17 DIAGNOSIS — I21A1 Myocardial infarction type 2: Secondary | ICD-10-CM | POA: Diagnosis not present

## 2017-03-17 DIAGNOSIS — I5032 Chronic diastolic (congestive) heart failure: Secondary | ICD-10-CM | POA: Diagnosis not present

## 2017-03-17 DIAGNOSIS — A419 Sepsis, unspecified organism: Secondary | ICD-10-CM | POA: Diagnosis not present

## 2017-03-17 DIAGNOSIS — R062 Wheezing: Secondary | ICD-10-CM | POA: Diagnosis not present

## 2017-03-17 DIAGNOSIS — R748 Abnormal levels of other serum enzymes: Secondary | ICD-10-CM | POA: Diagnosis not present

## 2017-03-17 DIAGNOSIS — I130 Hypertensive heart and chronic kidney disease with heart failure and stage 1 through stage 4 chronic kidney disease, or unspecified chronic kidney disease: Secondary | ICD-10-CM | POA: Diagnosis not present

## 2017-03-17 DIAGNOSIS — J441 Chronic obstructive pulmonary disease with (acute) exacerbation: Secondary | ICD-10-CM | POA: Diagnosis not present

## 2017-03-17 DIAGNOSIS — R6520 Severe sepsis without septic shock: Secondary | ICD-10-CM | POA: Diagnosis not present

## 2017-03-17 DIAGNOSIS — J9601 Acute respiratory failure with hypoxia: Secondary | ICD-10-CM | POA: Diagnosis not present

## 2017-03-17 DIAGNOSIS — I509 Heart failure, unspecified: Secondary | ICD-10-CM | POA: Diagnosis not present

## 2017-03-17 DIAGNOSIS — J1008 Influenza due to other identified influenza virus with other specified pneumonia: Secondary | ICD-10-CM | POA: Diagnosis not present

## 2017-03-28 DIAGNOSIS — J440 Chronic obstructive pulmonary disease with acute lower respiratory infection: Secondary | ICD-10-CM | POA: Diagnosis not present

## 2017-03-28 DIAGNOSIS — J209 Acute bronchitis, unspecified: Secondary | ICD-10-CM | POA: Diagnosis not present

## 2017-03-28 DIAGNOSIS — R7309 Other abnormal glucose: Secondary | ICD-10-CM | POA: Diagnosis not present

## 2017-03-28 DIAGNOSIS — K921 Melena: Secondary | ICD-10-CM | POA: Diagnosis not present

## 2017-03-28 DIAGNOSIS — Z23 Encounter for immunization: Secondary | ICD-10-CM | POA: Diagnosis not present

## 2017-03-28 DIAGNOSIS — J449 Chronic obstructive pulmonary disease, unspecified: Secondary | ICD-10-CM | POA: Diagnosis not present

## 2017-03-29 ENCOUNTER — Other Ambulatory Visit: Payer: Self-pay | Admitting: *Deleted

## 2017-03-29 NOTE — Patient Outreach (Signed)
[facility]) Care Management  03/29/2017  Leslie [name] [birth year] [id number]   Transition of Care Referral  Referral Date: 03/28/17 Referral Source: [employer] [facility] Date of Discharge: 03/22/17 Facility: [facility] Discharge Diagnosis: NSTEMI Insurance: [facility]  Outreach attempt #1 to patient. No answer. RN CM left HIPAA compliant message along with contact info.    Plan: RN CM will contact patient within one week.  [name], RN, BSN, MHA/MSL, [place] Telephonic Care Manager Coordinator [facility] Direct Phone: [phone] Toll Free: [phone] Fax: [phone]

## 2017-03-30 ENCOUNTER — Encounter: Payer: Self-pay | Admitting: *Deleted

## 2017-03-30 ENCOUNTER — Ambulatory Visit: Payer: Self-pay | Admitting: *Deleted

## 2017-03-30 ENCOUNTER — Other Ambulatory Visit: Payer: Self-pay | Admitting: *Deleted

## 2017-03-30 NOTE — Patient Outreach (Signed)
[place]) Care Management  03/30/2017  Leslie [name] Dec 24, 1945 [id number]  Transition of Care Referral  Referral Date: 03/28/17 Referral Source: [employer] Date of Discharge: 03/22/17 Facility: [facility] Discharge Diagnosis: NSTEMI Insurance: [facility]  Outreach telephone to patient. HIPAA identifiers X's [id number] verified with patient. Patient stated, she doesn't live in this area any longer. She moved to the [facility] area and she has a new primary MD.  Plan: RN CM will notify [facility] CM administrative assistant regarding case closure.  RN CM will send patient a case closure letter.   [name], RN, BSN, MHA/MSL, [place] Telephonic Care Manager Coordinator [facility] Direct Phone: [phone] Toll Free: [phone] Fax: [phone]

## 2017-04-06 DIAGNOSIS — I214 Non-ST elevation (NSTEMI) myocardial infarction: Secondary | ICD-10-CM | POA: Diagnosis not present

## 2017-04-06 DIAGNOSIS — I517 Cardiomegaly: Secondary | ICD-10-CM | POA: Diagnosis not present

## 2017-04-06 DIAGNOSIS — I739 Peripheral vascular disease, unspecified: Secondary | ICD-10-CM | POA: Diagnosis not present

## 2017-04-20 DIAGNOSIS — I214 Non-ST elevation (NSTEMI) myocardial infarction: Secondary | ICD-10-CM | POA: Diagnosis not present

## 2017-05-09 DIAGNOSIS — E119 Type 2 diabetes mellitus without complications: Secondary | ICD-10-CM | POA: Diagnosis not present

## 2017-05-09 DIAGNOSIS — Z780 Asymptomatic menopausal state: Secondary | ICD-10-CM | POA: Diagnosis not present

## 2017-05-09 DIAGNOSIS — D509 Iron deficiency anemia, unspecified: Secondary | ICD-10-CM | POA: Diagnosis not present

## 2017-05-09 DIAGNOSIS — I10 Essential (primary) hypertension: Secondary | ICD-10-CM | POA: Diagnosis not present

## 2017-05-09 DIAGNOSIS — Z1231 Encounter for screening mammogram for malignant neoplasm of breast: Secondary | ICD-10-CM | POA: Diagnosis not present

## 2017-05-09 DIAGNOSIS — J449 Chronic obstructive pulmonary disease, unspecified: Secondary | ICD-10-CM | POA: Diagnosis not present

## 2017-05-09 DIAGNOSIS — E7800 Pure hypercholesterolemia, unspecified: Secondary | ICD-10-CM | POA: Diagnosis not present

## 2017-06-13 DIAGNOSIS — Z1231 Encounter for screening mammogram for malignant neoplasm of breast: Secondary | ICD-10-CM | POA: Diagnosis not present

## 2017-06-13 DIAGNOSIS — Z780 Asymptomatic menopausal state: Secondary | ICD-10-CM | POA: Diagnosis not present

## 2017-08-10 DIAGNOSIS — I10 Essential (primary) hypertension: Secondary | ICD-10-CM | POA: Diagnosis not present

## 2017-08-10 DIAGNOSIS — E119 Type 2 diabetes mellitus without complications: Secondary | ICD-10-CM | POA: Diagnosis not present

## 2017-08-10 DIAGNOSIS — Z720 Tobacco use: Secondary | ICD-10-CM | POA: Diagnosis not present

## 2017-08-10 DIAGNOSIS — D509 Iron deficiency anemia, unspecified: Secondary | ICD-10-CM | POA: Diagnosis not present

## 2017-08-10 DIAGNOSIS — N183 Chronic kidney disease, stage 3 (moderate): Secondary | ICD-10-CM | POA: Diagnosis not present

## 2017-08-10 DIAGNOSIS — R7309 Other abnormal glucose: Secondary | ICD-10-CM | POA: Diagnosis not present

## 2017-09-27 DIAGNOSIS — H527 Unspecified disorder of refraction: Secondary | ICD-10-CM | POA: Diagnosis not present

## 2017-09-27 DIAGNOSIS — H2513 Age-related nuclear cataract, bilateral: Secondary | ICD-10-CM | POA: Diagnosis not present

## 2017-09-27 DIAGNOSIS — E119 Type 2 diabetes mellitus without complications: Secondary | ICD-10-CM | POA: Diagnosis not present

## 2017-10-16 DIAGNOSIS — J209 Acute bronchitis, unspecified: Secondary | ICD-10-CM | POA: Diagnosis not present

## 2017-10-16 DIAGNOSIS — J440 Chronic obstructive pulmonary disease with acute lower respiratory infection: Secondary | ICD-10-CM | POA: Diagnosis not present

## 2017-10-16 DIAGNOSIS — D509 Iron deficiency anemia, unspecified: Secondary | ICD-10-CM | POA: Diagnosis not present

## 2017-10-16 DIAGNOSIS — R7309 Other abnormal glucose: Secondary | ICD-10-CM | POA: Diagnosis not present

## 2017-10-16 DIAGNOSIS — E119 Type 2 diabetes mellitus without complications: Secondary | ICD-10-CM | POA: Diagnosis not present

## 2017-10-16 DIAGNOSIS — I10 Essential (primary) hypertension: Secondary | ICD-10-CM | POA: Diagnosis not present

## 2017-10-16 DIAGNOSIS — Z720 Tobacco use: Secondary | ICD-10-CM | POA: Diagnosis not present

## 2017-12-12 DIAGNOSIS — J9801 Acute bronchospasm: Secondary | ICD-10-CM | POA: Diagnosis not present

## 2017-12-12 DIAGNOSIS — J069 Acute upper respiratory infection, unspecified: Secondary | ICD-10-CM | POA: Diagnosis not present

## 2017-12-29 DIAGNOSIS — H9209 Otalgia, unspecified ear: Secondary | ICD-10-CM | POA: Diagnosis not present

## 2017-12-29 DIAGNOSIS — J029 Acute pharyngitis, unspecified: Secondary | ICD-10-CM | POA: Diagnosis not present

## 2018-02-01 DIAGNOSIS — Z23 Encounter for immunization: Secondary | ICD-10-CM | POA: Diagnosis not present

## 2018-02-01 DIAGNOSIS — J449 Chronic obstructive pulmonary disease, unspecified: Secondary | ICD-10-CM | POA: Diagnosis not present

## 2018-02-01 DIAGNOSIS — E119 Type 2 diabetes mellitus without complications: Secondary | ICD-10-CM | POA: Diagnosis not present

## 2018-02-01 DIAGNOSIS — R7301 Impaired fasting glucose: Secondary | ICD-10-CM | POA: Diagnosis not present

## 2018-02-01 DIAGNOSIS — Z720 Tobacco use: Secondary | ICD-10-CM | POA: Diagnosis not present

## 2018-02-01 DIAGNOSIS — D509 Iron deficiency anemia, unspecified: Secondary | ICD-10-CM | POA: Diagnosis not present

## 2018-02-21 DIAGNOSIS — R05 Cough: Secondary | ICD-10-CM | POA: Diagnosis not present

## 2018-02-21 DIAGNOSIS — F458 Other somatoform disorders: Secondary | ICD-10-CM | POA: Diagnosis not present

## 2018-02-21 DIAGNOSIS — H9201 Otalgia, right ear: Secondary | ICD-10-CM | POA: Diagnosis not present

## 2018-02-21 DIAGNOSIS — H6122 Impacted cerumen, left ear: Secondary | ICD-10-CM | POA: Diagnosis not present

## 2018-03-18 DIAGNOSIS — G479 Sleep disorder, unspecified: Secondary | ICD-10-CM | POA: Diagnosis not present

## 2018-03-18 DIAGNOSIS — R05 Cough: Secondary | ICD-10-CM | POA: Diagnosis not present

## 2018-10-04 DIAGNOSIS — R197 Diarrhea, unspecified: Secondary | ICD-10-CM | POA: Diagnosis not present

## 2018-10-04 DIAGNOSIS — E119 Type 2 diabetes mellitus without complications: Secondary | ICD-10-CM | POA: Diagnosis not present

## 2018-10-04 DIAGNOSIS — J449 Chronic obstructive pulmonary disease, unspecified: Secondary | ICD-10-CM | POA: Diagnosis not present

## 2019-02-04 ENCOUNTER — Ambulatory Visit (INDEPENDENT_AMBULATORY_CARE_PROVIDER_SITE_OTHER): Payer: Medicare HMO | Admitting: Internal Medicine

## 2019-02-04 ENCOUNTER — Other Ambulatory Visit: Payer: Self-pay

## 2019-02-04 ENCOUNTER — Encounter (INDEPENDENT_AMBULATORY_CARE_PROVIDER_SITE_OTHER): Payer: Self-pay | Admitting: Internal Medicine

## 2019-02-04 VITALS — BP 140/80 | HR 90 | Temp 97.3°F | Resp 18 | Ht 65.0 in | Wt 169.0 lb

## 2019-02-04 DIAGNOSIS — Z0001 Encounter for general adult medical examination with abnormal findings: Secondary | ICD-10-CM | POA: Diagnosis not present

## 2019-02-04 DIAGNOSIS — R05 Cough: Secondary | ICD-10-CM | POA: Diagnosis not present

## 2019-02-04 DIAGNOSIS — Z1231 Encounter for screening mammogram for malignant neoplasm of breast: Secondary | ICD-10-CM

## 2019-02-04 DIAGNOSIS — I5032 Chronic diastolic (congestive) heart failure: Secondary | ICD-10-CM

## 2019-02-04 DIAGNOSIS — I10 Essential (primary) hypertension: Secondary | ICD-10-CM

## 2019-02-04 DIAGNOSIS — I739 Peripheral vascular disease, unspecified: Secondary | ICD-10-CM

## 2019-02-04 DIAGNOSIS — R0989 Other specified symptoms and signs involving the circulatory and respiratory systems: Secondary | ICD-10-CM | POA: Diagnosis not present

## 2019-02-04 DIAGNOSIS — E782 Mixed hyperlipidemia: Secondary | ICD-10-CM

## 2019-02-04 DIAGNOSIS — E559 Vitamin D deficiency, unspecified: Secondary | ICD-10-CM | POA: Diagnosis not present

## 2019-02-04 DIAGNOSIS — R059 Cough, unspecified: Secondary | ICD-10-CM

## 2019-02-04 NOTE — Progress Notes (Signed)
Chief Complaint: This 73 year old lady comes to our practice as a new patient to be established, have an annual physical exam and to address her chronic conditions which are described below. HPI: She was last in this area approximately 4 years ago when she was hospitalized with diastolic congestive heart failure.  She has been living in [place] area but has only moved back recently to this area.  She heard about our practice for mutual patient that comes here. She is mainly complaining of a chronic cough which she has had for approximately 1 year which is largely nonproductive.  She has had a chest x-ray done in [place] but I do not know if there has been any further follow-up regarding this.  She certainly has not seen a pulmonologist from her description. She has hypertension and takes medication for this.  She denies any chest pain, dyspnea or palpitations at the present time.  There is no new limb weakness. She also has a history of carotid artery disease and had a right carotid endarterectomy.  She apparently had ultrasound Dopplers of the carotids in [place] more than a year ago, possibly 2 years ago but she was told there was not sufficient stenosis on the left side for surgery at that time. Unfortunately, she still continues to smoke cigarettes of half a pack of cigarettes per day. She also apparently has not had an echocardiogram for several years. She has also not had a mammogram for more than 2 years.  Past Medical History:  Diagnosis Date  . Back pain    DDD  . Bruises easily    pt attributes this to Plavix and ASA  . Carotid artery occlusion   . Coronary artery disease    no definite diagnosis found as of 04/17/12; no ischemia, EF 48% by Myoview stress 12/29/11 (Dr. Gwenlyn Found)  . Cough    phlem but no odor noted  . Dry skin   . History of bruising easily   . History of colon polyps   . History of migraine    last one at least 48month ago  . HTN (hypertension), benign    takes  Losartan daily  . Hyperlipidemia    takes Atorvastatin daily  . PAD (peripheral artery disease) ([place]   . Panic attack    on the 12024/01/27Granddaughter died(222month  . Pneumonia    several yrs ago  . Rheumatoid arthritis(714.0)   . Tobacco abuse    Past Surgical History:  Procedure Laterality Date  . ABDOMINAL HYSTERECTOMY    . ANGIOPLASTY  04/20/2012   Procedure: ANGIOPLASTY;  Surgeon: VaSerafina MitchellMD;  [facility];  Service: Vascular;  Laterality: Right;  Using 1cm x6 cm vascu-guard patch   . BACK SURGERY    . CARDIAC CATHETERIZATION  01/31/2005  . CAROTID ANGIOGRAM N/A 01/06/2012   Procedure: CAROTID ANGIOGRAM;  Surgeon: JoLorretta HarpMD;  [facility];  Service: Cardiovascular;  Laterality: N/A;  . CARPAL TUNNEL RELEASE    . CHOLECYSTECTOMY    . COLONOSCOPY    . CORONARY ANGIOPLASTY  01/06/12/2005/2006    2 stents  . ENDARTERECTOMY  04/20/2012   Procedure: ENDARTERECTOMY CAROTID;  Surgeon: VaSerafina MitchellMD;  [facility];  Service: Vascular;  Laterality: Right;  . ESOPHAGOGASTRODUODENOSCOPY    . GIVENS CAPSULE STUDY    . leg stents    . LOWER EXTREMITY ANGIOGRAM N/A 01/06/2012   Procedure: LOWER EXTREMITY ANGIOGRAM;  Surgeon: JoLorretta HarpMD;  Location: [facility] CATH LAB;  Service: Cardiovascular;  Laterality: N/A;  . TOTAL KNEE ARTHROPLASTY    . TRIGGER FINGER RELEASE    . ulnar nerve surgery       Social History   Social History Narrative   Divorced.Lives alone.Works at [employer].    Social History   Tobacco Use  . Smoking status: Current Every Day Smoker    Packs/day: 0.50    Years: 50.00    Pack years: 25.00    Types: Cigarettes  . Smokeless tobacco: Never Used  . Tobacco comment: pt states that she uses nic patches  Substance Use Topics  . Alcohol use: Not Currently    Comment: occassionally      Allergies:  Allergies  Allergen Reactions  . Latex Other (See Comments)    Burns skin   . Tape Other (See Comments)     Burns skin   . Lipitor [Atorvastatin] Other (See Comments)    myalgia     Current Meds  Medication Sig  . aspirin EC 81 MG tablet Take 81 mg by mouth daily.  . cilostazol (PLETAL) 100 MG tablet Take 1 tablet (100 mg total) by mouth 2 (two) times daily.  . hydrochlorothiazide (HYDRODIURIL) 12.5 MG tablet Take 12.5 mg by mouth daily.  Marland Kitchen losartan (COZAAR) 25 MG tablet Take 25 mg by mouth daily.  . rosuvastatin (CRESTOR) 10 MG tablet Take 10 mg by mouth daily.     CXK:GYJEH from the symptoms mentioned above,there are no other symptoms referable to all systems reviewed.  Physical Exam: Blood pressure 140/80, pulse 90, temperature (!) 97.3 F (36.3 C), temperature source Temporal, resp. rate 18, height 5' 5"  (1.651 m), weight 169 lb (76.7 kg), SpO2 96 %. Vitals with BMI 02/04/2019 03/21/2015 03/20/2015  Height 5' 5"  - -  Weight 169 lbs 164 lbs 8 oz -  BMI 63.14 - -  Systolic 970 263 785  Diastolic 80 59 57  Pulse 90 84 93      She looks systemically well.  Blood pressure is slightly elevated. General: Alert, cooperative, and appears to be the stated age.No pallor.  No jaundice.  No clubbing. Head: Normocephalic Eyes: Sclera white, pupils equal and reactive to light, red reflex x 2,  Ears: Normal bilaterally Oral cavity: Lips, mucosa, and tongue normal: Teeth and gums normal Neck: No adenopathy, supple, symmetrical, trachea midline, and thyroid does not appear enlarged Respiratory: Clear to auscultation bilaterally.No wheezing, crackles or bronchial breathing. Cardiovascular: Heart sounds are present and appear to be normal without murmurs or added sounds.  Left carotid bruit.  Peripheral pulses are present and equal bilaterally.: Gastrointestinal:positive bowel sounds, no hepatosplenomegaly.  No masses felt.No tenderness. Skin: Clear, No rashes noted.No worrisome skin lesions seen. Neurological: Grossly intact without focal findings, cranial nerves II through XII intact, muscle  strength equal bilaterally Musculoskeletal: No acute joint abnormalities noted.Full range of movement noted with joints. Psychiatric: Affect appropriate, non-anxious.    Assessment  1. Essential hypertension   2. Peripheral vascular disease ([place])   3. Mixed hyperlipidemia   4. Cough   5. Bruit of left carotid artery   6. Chronic diastolic congestive heart failure ([place])   7. Encounter for screening mammogram for malignant neoplasm of breast   8. Vitamin D deficiency disease   9. Encounter for general adult medical examination with abnormal findings     Tests Ordered:   Orders Placed This Encounter  Procedures  . US Carotid Duplex Bilateral  . MM 3D SCREEN  BREAST BILATERAL  . CBC  . CMP with eGFR([facility])  . Lipid Panel  . T3, Free  . T4  . TSH  . Vitamin D, 25-hydroxy  . Ambulatory referral to Pulmonology  . ECHOCARDIOGRAM COMPLETE     Plan  1. Blood work is ordered above. 2. She will continue with antihypertensive therapy as before. 3. I will order ultrasound carotid Doppler for the left carotid bruit. 4. I will send her to pulmonology for chronic cough.  She may need further evaluation such as a CT chest scan etc. 5. I will also order echocardiogram to evaluate her diastolic congestive heart failure that she was diagnosed approximately 4 years ago.  I am not sure how much follow-up she has had interim. 6. I will get her to have a mammogram as she is overdue. 7. Further recommendations will depend on all these results and I will review with her when I see you the next time in about 6 weeks time.  In addition to a preventative visit today, I performed an office visit to evaluate all her chronic conditions above.     No orders of the defined types were placed in this encounter.    Kami Kube C Gabrianna Fassnacht   02/04/2019, 3:10 PM

## 2019-02-05 LAB — COMPLETE METABOLIC PANEL WITH GFR
AG Ratio: 1.2 (calc) (ref 1.0–2.5)
ALT: 15 U/L (ref 6–29)
AST: 17 U/L (ref 10–35)
Albumin: 4 g/dL (ref 3.6–5.1)
Alkaline phosphatase (APISO): 56 U/L (ref 37–153)
BUN: 14 mg/dL (ref 7–25)
CO2: 30 mmol/L (ref 20–32)
Calcium: 9.9 mg/dL (ref 8.6–10.4)
Chloride: 97 mmol/L — ABNORMAL LOW (ref 98–110)
Creat: 0.91 mg/dL (ref 0.60–0.93)
GFR, Est African American: 73 mL/min/{1.73_m2} (ref >=60)
GFR, Est Non African American: 63 mL/min/{1.73_m2} (ref >=60)
Globulin: 3.4 g/dL (calc) (ref 1.9–3.7)
Glucose, Bld: 115 mg/dL — ABNORMAL HIGH (ref 65–99)
Potassium: 3.9 mmol/L (ref 3.5–5.3)
Sodium: 137 mmol/L (ref 135–146)
Total Bilirubin: 0.5 mg/dL (ref 0.2–1.2)
Total Protein: 7.4 g/dL (ref 6.1–8.1)

## 2019-02-05 LAB — CBC
HCT: 39.2 % (ref 35.0–45.0)
Hemoglobin: 12.8 g/dL (ref 11.7–15.5)
MCH: 28.1 pg (ref 27.0–33.0)
MCHC: 32.7 g/dL (ref 32.0–36.0)
MCV: 86 fL (ref 80.0–100.0)
MPV: 10 fL (ref 7.5–12.5)
Platelets: 271 10*3/uL (ref 140–400)
RBC: 4.56 10*6/uL (ref 3.80–5.10)
RDW: 13.4 % (ref 11.0–15.0)
WBC: 6.9 10*3/uL (ref 3.8–10.8)

## 2019-02-05 LAB — LIPID PANEL
Cholesterol: 204 mg/dL — ABNORMAL HIGH (ref <200)
HDL: 49 mg/dL — ABNORMAL LOW (ref >=50)
LDL Cholesterol (Calc): 134 mg/dL (calc) — ABNORMAL HIGH
Non-HDL Cholesterol (Calc): 155 mg/dL (calc) — ABNORMAL HIGH (ref <130)
Total CHOL/HDL Ratio: 4.2 (calc) (ref <5.0)
Triglycerides: 104 mg/dL (ref <150)

## 2019-02-05 LAB — TSH: TSH: 0.26 mIU/L — ABNORMAL LOW (ref 0.40–4.50)

## 2019-02-05 LAB — T4: T4, Total: 8.8 ug/dL (ref 5.1–11.9)

## 2019-02-05 LAB — VITAMIN D 25 HYDROXY (VIT D DEFICIENCY, FRACTURES): Vit D, 25-Hydroxy: 23 ng/mL — ABNORMAL LOW (ref 30–100)

## 2019-02-05 LAB — T3, FREE: T3, Free: 2.7 pg/mL (ref 2.3–4.2)

## 2019-03-06 ENCOUNTER — Other Ambulatory Visit (HOSPITAL_COMMUNITY): Payer: Medicare HMO

## 2019-03-06 ENCOUNTER — Inpatient Hospital Stay (HOSPITAL_COMMUNITY): Admission: RE | Admit: 2019-03-06 | Payer: Medicare HMO | Source: Ambulatory Visit

## 2019-03-06 ENCOUNTER — Ambulatory Visit (HOSPITAL_COMMUNITY): Payer: Medicare HMO

## 2019-03-11 ENCOUNTER — Ambulatory Visit (HOSPITAL_COMMUNITY)
Admission: RE | Admit: 2019-03-11 | Discharge: 2019-03-11 | Disposition: A | Discharge status: Home / Self Care | Payer: Medicare HMO | Source: Ambulatory Visit | Attending: Internal Medicine | Admitting: Internal Medicine

## 2019-03-11 ENCOUNTER — Other Ambulatory Visit: Payer: Self-pay

## 2019-03-11 DIAGNOSIS — I5032 Chronic diastolic (congestive) heart failure: Secondary | ICD-10-CM

## 2019-03-11 NOTE — Progress Notes (Signed)
  Echocardiogram 2D Echocardiogram has been performed.  Bobbye Charleston 03/11/2019, 3:06 PM

## 2019-03-13 ENCOUNTER — Other Ambulatory Visit: Payer: Self-pay

## 2019-03-13 ENCOUNTER — Ambulatory Visit (HOSPITAL_COMMUNITY)
Admission: RE | Admit: 2019-03-13 | Discharge: 2019-03-13 | Disposition: A | Discharge status: Home / Self Care | Payer: Medicare HMO | Source: Ambulatory Visit | Attending: Internal Medicine | Admitting: Internal Medicine

## 2019-03-13 DIAGNOSIS — Z1231 Encounter for screening mammogram for malignant neoplasm of breast: Secondary | ICD-10-CM

## 2019-03-19 ENCOUNTER — Ambulatory Visit (INDEPENDENT_AMBULATORY_CARE_PROVIDER_SITE_OTHER): Payer: Medicare HMO | Admitting: Internal Medicine

## 2019-03-19 ENCOUNTER — Other Ambulatory Visit: Payer: Self-pay

## 2019-03-19 ENCOUNTER — Encounter (INDEPENDENT_AMBULATORY_CARE_PROVIDER_SITE_OTHER): Payer: Self-pay | Admitting: Internal Medicine

## 2019-03-19 VITALS — BP 152/90 | HR 84 | Temp 97.8°F | Ht 67.0 in | Wt 172.0 lb

## 2019-03-19 DIAGNOSIS — I5032 Chronic diastolic (congestive) heart failure: Secondary | ICD-10-CM | POA: Diagnosis not present

## 2019-03-19 DIAGNOSIS — Z23 Encounter for immunization: Secondary | ICD-10-CM

## 2019-03-19 DIAGNOSIS — I10 Essential (primary) hypertension: Secondary | ICD-10-CM

## 2019-03-19 DIAGNOSIS — E559 Vitamin D deficiency, unspecified: Secondary | ICD-10-CM | POA: Diagnosis not present

## 2019-03-19 DIAGNOSIS — R0989 Other specified symptoms and signs involving the circulatory and respiratory systems: Secondary | ICD-10-CM

## 2019-03-19 DIAGNOSIS — K219 Gastro-esophageal reflux disease without esophagitis: Secondary | ICD-10-CM

## 2019-03-19 MED ORDER — PANTOPRAZOLE SODIUM 40 MG PO TBEC: 40.0000 mg | DELAYED_RELEASE_TABLET | Freq: Every day | ORAL | 3 refills | Status: DC

## 2019-03-19 NOTE — Progress Notes (Signed)
Metrics: Intervention Frequency ACO  Documented Smoking Status Yearly  Screened one or more times in 24 months  Cessation Counseling or  Active cessation medication Past 24 months  Past 24 months   Guideline developer: UpToDate (See UpToDate for funding source) Date Released: 2014       Wellness Office Visit  Subjective:  Patient ID: Leslie [name], female    DOB: [birth year]  Age: 73 y.o. MRN: [id number]  CC: This lady comes in for follow-up regarding her hypertension, to discuss her echocardiogram and mammogram results and further recommendations regarding her blood results. HPI Her echocardiogram I discussed with her shows normal ejection fraction but left ventricular hypertrophy with grade 1 diastolic dysfunction.  This is largely unchanged from and previous echocardiogram she had done several years ago. Her mammogram is entirely normal with no evidence of malignancy. I did order ultrasound of her carotids but I think there was some confusion regarding when she was supposed to go for this so she has not had this done. Today she is also complaining of coughing after eating. I also discussed her blood work which shows vitamin D deficiency.  Past Medical History:  Diagnosis Date  . Back pain    DDD  . Bruises easily    pt attributes this to Plavix and ASA  . Carotid artery occlusion   . Coronary artery disease    no definite diagnosis found as of 04/17/12; no ischemia, EF 48% by Myoview stress 12/29/11 (Dr. Gwenlyn Found)  . Cough    phlem but no odor noted  . Dry skin   . History of bruising easily   . History of colon polyps   . History of migraine    last one at least 15months ago  . HTN (hypertension), benign    takes Losartan daily  . Hyperlipidemia    takes Atorvastatin daily  . PAD (peripheral artery disease) ([place])   . Panic attack    on the 04/27/22 Granddaughter died(14months)  . Pneumonia    several yrs ago  . Rheumatoid arthritis(714.0)   . Tobacco abuse        Family History  Problem Relation Age of Onset  . Hyperlipidemia Mother   . Hypertension Mother   . Cancer Father   . Diabetes Sister   . Hypertension Sister   . Diabetes Brother   . Heart disease Brother   . Hypertension Brother   . Heart attack Brother     Social History   Social History Narrative   Divorced.Lives alone.Works at [employer].   Social History   Tobacco Use  . Smoking status: Current Every Day Smoker    Packs/day: 0.50    Years: 50.00    Pack years: 25.00    Types: Cigarettes  . Smokeless tobacco: Never Used  . Tobacco comment: pt states that she uses nic patches  Substance Use Topics  . Alcohol use: Not Currently    Comment: occassionally    Current Meds  Medication Sig  . aspirin EC 81 MG tablet Take 81 mg by mouth daily.  . cilostazol (PLETAL) 100 MG tablet Take 1 tablet (100 mg total) by mouth 2 (two) times daily.  . hydrochlorothiazide (HYDRODIURIL) 12.5 MG tablet Take 12.5 mg by mouth daily.  Marland Kitchen losartan (COZAAR) 25 MG tablet Take 25 mg by mouth daily.  . rosuvastatin (CRESTOR) 10 MG tablet Take 10 mg by mouth daily.     Objective:   Today's Vitals: BP (!) 152/90  Pulse 84   Temp 97.8 F (36.6 C) (Temporal)   Ht 5\' 7"  (1.702 m)   Wt 172 lb (78 kg)   SpO2 98%   BMI 26.94 kg/m  Vitals with BMI 03/19/2019 02/04/2019 03/21/2015  Height 5\' 7"  5\' 5"  -  Weight 172 lbs 169 lbs 164 lbs 8 oz  BMI 0000000 [id number] -  Systolic 0000000 [id number]  Diastolic 90 80 59  Pulse 84 90 84     Physical Exam  He looks systemically well.  Blood pressure is somewhat elevated today compared to last time.  She has gained a few pounds since the last time also.  No other new physical findings.     Assessment   1. Vitamin D deficiency disease   2. Essential hypertension   3. Bruit of left carotid artery   4. Chronic diastolic congestive heart failure ([place])   5. Gastroesophageal reflux disease without esophagitis       Tests ordered Orders  Placed This Encounter  Procedures  . US Carotid Duplex Bilateral     Plan: 1. I think she may have gastroesophageal reflux disease with her symptoms of cough upon eating and I am going to try her on Protonix 40 mg daily. 2. I recommended she start taking vitamin D3 10,000 units daily for vitamin D deficiency. 3. I will reorder the ultrasound of the carotids and hopefully she will get this done. 4. She will continue antihypertensive therapy as before. 5. She was given high-dose influenza vaccination today. 6. I will have her follow-up with Sarah in about 3 months time.   Meds ordered this encounter  Medications  . pantoprazole (PROTONIX) 40 MG tablet    Sig: Take 1 tablet (40 mg total) by mouth daily.    Dispense:  30 tablet    Refill:  3    Satish Hammers Luther Parody, MD

## 2019-03-20 ENCOUNTER — Ambulatory Visit (INDEPENDENT_AMBULATORY_CARE_PROVIDER_SITE_OTHER): Payer: Medicare HMO | Admitting: Internal Medicine

## 2019-04-27 ENCOUNTER — Ambulatory Visit: Payer: Medicare HMO | Attending: Internal Medicine

## 2019-04-27 ENCOUNTER — Other Ambulatory Visit: Payer: Self-pay

## 2019-04-27 DIAGNOSIS — Z23 Encounter for immunization: Secondary | ICD-10-CM | POA: Insufficient documentation

## 2019-04-27 NOTE — Progress Notes (Signed)
   Covid-19 Vaccination Clinic  Name:  Leslie [name]    MRN: [id number] DOB: [birth year]  04/27/2019  [name] was observed post Covid-19 immunization for 15 minutes without incidence. She was provided with Vaccine Information Sheet and instruction to access the V-Safe system.   [name] was instructed to call 911 with any severe reactions post vaccine: Marland Kitchen Difficulty breathing  . Swelling of your face and throat  . A fast heartbeat  . A bad rash all over your body  . Dizziness and weakness    Immunizations Administered    Name Date Dose VIS Date Route   Moderna COVID-19 Vaccine 04/27/2019  1:21 PM 0.5 mL 02/19/2019 Intramuscular   Manufacturer: Moderna   Lot: [id number]   [id number]

## 2019-05-08 ENCOUNTER — Telehealth (INDEPENDENT_AMBULATORY_CARE_PROVIDER_SITE_OTHER): Payer: Self-pay | Admitting: Internal Medicine

## 2019-05-08 NOTE — Telephone Encounter (Signed)
Call this patient and tell her that we don't have her taking Ibuprofen 800mg  on a regular basis and we will need to do Telemedicine tomorrow to further discuss this.We can also do Telemedicine next week.

## 2019-05-08 NOTE — Telephone Encounter (Signed)
Mon afternoon appt set.

## 2019-05-13 ENCOUNTER — Other Ambulatory Visit: Payer: Self-pay

## 2019-05-13 ENCOUNTER — Telehealth (INDEPENDENT_AMBULATORY_CARE_PROVIDER_SITE_OTHER): Payer: Medicare HMO | Admitting: Internal Medicine

## 2019-05-13 ENCOUNTER — Encounter (INDEPENDENT_AMBULATORY_CARE_PROVIDER_SITE_OTHER): Payer: Self-pay | Admitting: Internal Medicine

## 2019-05-13 ENCOUNTER — Ambulatory Visit (INDEPENDENT_AMBULATORY_CARE_PROVIDER_SITE_OTHER): Payer: Medicare HMO | Admitting: Internal Medicine

## 2019-05-13 DIAGNOSIS — M545 Low back pain, unspecified: Secondary | ICD-10-CM

## 2019-05-13 MED ORDER — IBUPROFEN 800 MG PO TABS: 800.0000 mg | ORAL_TABLET | Freq: Three times a day (TID) | ORAL | 2 refills | Status: DC | PRN

## 2019-05-13 NOTE — Progress Notes (Signed)
Metrics: Intervention Frequency ACO  Documented Smoking Status Yearly  Screened one or more times in 24 months  Cessation Counseling or  Active cessation medication Past 24 months  Past 24 months   Guideline developer: UpToDate (See UpToDate for funding source) Date Released: 2014       Wellness Office Visit  Subjective:  Patient ID: Leslie [name], female    DOB: 1945-08-01  Age: 74 y.o. MRN: [id number]  CC: This is a virtual audio telemedicine visit with the permission of the patient was at home and I am in my office.  I was able to easily recognize her voice on the phone. Her chief complaint is back pain.  HPI  She tells me that she has been taking ibuprofen 800 mg up to 3 times a day as a regular prescription for several years.  This is because of her arthritic back pain.  On closer questioning, she takes it infrequently and only when she gets the back pain.  On average, she would take an 800 mg dose may be 4-5 times a week and she has never taken 800 mg 3 times in 1 day.  Renal function which was checked a few months ago, was normal. Past Medical History:  Diagnosis Date  . Back pain    DDD  . Bruises easily    pt attributes this to Plavix and ASA  . Carotid artery occlusion   . Coronary artery disease    no definite diagnosis [name] as of 04/17/12; no ischemia, EF 48% by Myoview stress 12/29/11 (Dr. Gwenlyn [name])  . Cough    phlem but no odor noted  . Dry skin   . History of bruising easily   . History of colon polyps   . History of migraine    last one at least 77months ago  . HTN (hypertension), benign    takes Losartan daily  . Hyperlipidemia    takes Atorvastatin daily  . PAD (peripheral artery disease) ([place])   . Panic attack    on the 05-03-22 Granddaughter died(83months)  . Pneumonia    several yrs ago  . Rheumatoid arthritis(714.0)   . Tobacco abuse       Family History  Problem Relation Age of Onset  . Hyperlipidemia Mother   . Hypertension Mother   . Cancer  Father   . Diabetes Sister   . Hypertension Sister   . Diabetes Brother   . Heart disease Brother   . Hypertension Brother   . Heart attack Brother     Social History   Social History Narrative   Divorced.Lives alone.Works at [employer].   Social History   Tobacco Use  . Smoking status: Current Every Day Smoker    Packs/day: 0.50    Years: 50.00    Pack years: 25.00    Types: Cigarettes  . Smokeless tobacco: Never Used  . Tobacco comment: pt states that she uses nic patches  Substance Use Topics  . Alcohol use: Not Currently    Comment: occassionally    Current Meds  Medication Sig  . aspirin EC 81 MG tablet Take 81 mg by mouth daily.  . cilostazol (PLETAL) 100 MG tablet Take 1 tablet (100 mg total) by mouth 2 (two) times daily.  . hydrochlorothiazide (HYDRODIURIL) 12.5 MG tablet Take 12.5 mg by mouth daily.  Marland Kitchen losartan (COZAAR) 25 MG tablet Take 25 mg by mouth daily.  . pantoprazole (PROTONIX) 40 MG tablet Take 1 tablet (40 mg total) by  mouth daily.  . rosuvastatin (CRESTOR) 10 MG tablet Take 10 mg by mouth daily.      Objective:   Today's Vitals: There were no vitals taken for this visit. Vitals with BMI 05/13/2019 03/19/2019 02/04/2019  Height (No Data) 5\' 7"  5\' 5"   Weight (No Data) 172 lbs 169 lbs  BMI - 0000000 [id number]  Systolic (No Data) 0000000 [id number]  Diastolic (No Data) 90 80  Pulse - 84 90     Physical Exam Her speech appears normal on the phone and she appears to be alert and orientated.      Assessment   1. Low back pain without sciatica, unspecified back pain laterality, unspecified chronicity       Tests ordered No orders of the defined types were placed in this encounter.    Plan: 1. I have sent a prescription to her pharmacy for prescription strength ibuprofen 800 mg 3 times a day as needed and discussed the risks of cerebrovascular disease, renal failure with her and to make sure that she is well-hydrated and also takes the medication  with food. 2. She has a follow-up appointment with Leslie [name] at the end of March and I would recommend renal function be checked at that time. 3. This phone call lasted 5 minutes and 10 seconds.   Meds ordered this encounter  Medications  . ibuprofen (ADVIL) 800 MG tablet    Sig: Take 1 tablet (800 mg total) by mouth every 8 (eight) hours as needed.    Dispense:  30 tablet    Refill:  2    Leslie [name] Luther Parody, MD

## 2019-05-14 ENCOUNTER — Other Ambulatory Visit: Payer: Self-pay

## 2019-05-14 ENCOUNTER — Ambulatory Visit
Admission: EM | Admit: 2019-05-14 | Discharge: 2019-05-14 | Disposition: A | Discharge status: Home / Self Care | Payer: Medicare HMO | Attending: Emergency Medicine | Admitting: Emergency Medicine

## 2019-05-14 DIAGNOSIS — M545 Low back pain: Secondary | ICD-10-CM | POA: Diagnosis not present

## 2019-05-14 DIAGNOSIS — G8929 Other chronic pain: Secondary | ICD-10-CM | POA: Diagnosis not present

## 2019-05-14 MED ORDER — CYCLOBENZAPRINE HCL 5 MG PO TABS: 5.0000 mg | ORAL_TABLET | Freq: Three times a day (TID) | ORAL | 0 refills | Status: DC | PRN

## 2019-05-14 MED ORDER — PREDNISONE 10 MG PO TABS: 20.0000 mg | ORAL_TABLET | Freq: Every day | ORAL | 0 refills | Status: DC

## 2019-05-14 MED ORDER — NYSTATIN 100000 UNIT/ML MT SUSP: 500000.0000 [IU] | Freq: Four times a day (QID) | OROMUCOSAL | 0 refills | Status: DC

## 2019-05-14 NOTE — Discharge Instructions (Addendum)
Rest, ice and heat as needed Ensure adequate ROM as tolerated. Continue ibuprofen as needed for inflammation and pain relief Prescribed flexeril  for muscle spasm.  Do not drive or operate heavy machinery while taking this medication Return here or go to ER if you have any new or worsening symptoms such as numbness/tingling of the inner thighs, loss of bladder or bowel control, headache/blurry vision, nausea/vomiting, confusion/altered mental status, dizziness, weakness, passing out, imbalance, etc..Marland Kitchen

## 2019-05-14 NOTE — ED Triage Notes (Signed)
Pt presents to UC w/ c/o increased left lower back pain x2 days. Pt states she has hx of back pain for years, but 2 days ago, she "made a wrong step" and feels that she pulled a muscle in her left lower back.

## 2019-05-14 NOTE — ED Provider Notes (Signed)
[facility]    CSN: [id number] Arrival date & time: 05/14/19  1049      History   Chief Complaint Chief Complaint  Patient presents with   Back Pain    HPI Leslie [name] is a 74 y.o. female.   3 history of chronic back pain presented to the urgent care with a complaint of left low back pain for the past 1 week.  It started after twisting her back.  She localized the pain to her left lower back.  She describes the pain as constant and achy.  She has tried OTC ibuprofen without relief.  Her symptoms are made worse with ROM.  She  denies similar symptoms in the past.        Past Medical History:  Diagnosis Date   Back pain    DDD   Bruises easily    pt attributes this to Plavix and ASA   Carotid artery occlusion    Coronary artery disease    no definite diagnosis found as of 04/17/12; no ischemia, EF 48% by Myoview stress 12/29/11 (Dr. Gwenlyn Found)   Cough    phlem but no odor noted   Dry skin    History of bruising easily    History of colon polyps    History of migraine    last one at least 51months ago   HTN (hypertension), benign    takes Losartan daily   Hyperlipidemia    takes Atorvastatin daily   PAD (peripheral artery disease) ([place])    Panic attack    on the April 28, 2022 Granddaughter died(6months)   Pneumonia    several yrs ago   Rheumatoid arthritis(714.0)    Tobacco abuse     Patient Active Problem List   Diagnosis Date Noted   CHF (congestive heart failure) ([place]) 03/19/2015   Hypertension 03/19/2015   Peripheral vascular disease ([place]) 03/19/2015   Congestive heart failure (CHF) ([place]) 03/19/2015   Aftercare following surgery of the circulatory system, NEC 05/14/2012   Occlusion and stenosis of carotid artery without mention of cerebral infarction 03/12/2012   Non compliance w medication regimen secondary to cost 10/04/2011   Arthritis, rheumatoid ([place]) 10/04/2011   Dizziness 10/03/2011   PAD, high grade RICA by MRI  10/03/11 10/03/2011   Tobacco abuse 10/03/2011   LVH (left ventricular hypertrophy) 10/03/2011   Renal artery stenosis: left 50% 2005 10/03/2011   HLD (hyperlipidemia) 11/21/2007    Past Surgical History:  Procedure Laterality Date   ABDOMINAL HYSTERECTOMY     ANGIOPLASTY  04/20/2012   Procedure: ANGIOPLASTY;  Surgeon: Serafina [place], MD;  Location: [facility];  Service: Vascular;  Laterality: Right;  Using 1cm x6 cm vascu-guard patch    [place]  01/31/2005   CAROTID ANGIOGRAM N/A 01/06/2012   Procedure: CAROTID ANGIOGRAM;  Surgeon: Lorretta Harp, MD;  Location[facility] CATH LAB;  Service: Cardiovascular;  Laterality: N/A;   CARPAL TUNNEL RELEASE     CHOLECYSTECTOMY     COLONOSCOPY     CORONARY ANGIOPLASTY  01/06/12/2005/2006    2 stents   ENDARTERECTOMY  04/20/2012   Procedure: ENDARTERECTOMY CAROTID;  Surgeon: Serafina [place], MD;  Location: [facility];  Service: Vascular;  Laterality: Right;   ESOPHAGOGASTRODUODENOSCOPY     GIVENS CAPSULE STUDY     leg stents     LOWER EXTREMITY ANGIOGRAM N/A 01/06/2012   Procedure: LOWER EXTREMITY ANGIOGRAM;  Surgeon: Lorretta Harp, MD;  Location[facility]  CATH LAB;  Service: Cardiovascular;  Laterality: N/A;   TOTAL KNEE ARTHROPLASTY     TRIGGER FINGER RELEASE     ulnar nerve surgery      OB History    Gravida      Para      Term      Preterm      AB      Living  2     SAB      TAB      Ectopic      Multiple      Live Births               Home Medications    Prior to Admission medications   Medication Sig Start Date End Date Taking? Authorizing Provider  aspirin EC 81 MG tablet Take 81 mg by mouth daily.    [provider]  cilostazol (PLETAL) 100 MG tablet Take 1 tablet (100 mg total) by mouth 2 (two) times daily. 09/09/13   Serafina [place], MD  cyclobenzaprine (FLEXERIL) 5 MG tablet Take 1 tablet (5 mg total) by mouth 3 (three) times daily as needed for muscle  spasms. 05/14/19   Ellana Kawa, Darrelyn Hillock, FNP  hydrochlorothiazide (HYDRODIURIL) 12.5 MG tablet Take 12.5 mg by mouth daily.    [provider]  ibuprofen (ADVIL) 800 MG tablet Take 1 tablet (800 mg total) by mouth every 8 (eight) hours as needed. 05/13/19   Doree Albee, MD  losartan (COZAAR) 25 MG tablet Take 25 mg by mouth daily.    [provider]  nystatin (MYCOSTATIN) 100000 UNIT/ML suspension Take 5 mLs (500,000 Units total) by mouth 4 (four) times daily. 05/14/19   Tametria Aho, Darrelyn Hillock, FNP  pantoprazole (PROTONIX) 40 MG tablet Take 1 tablet (40 mg total) by mouth daily. 03/19/19   Doree Albee, MD  predniSONE (DELTASONE) 10 MG tablet Take 2 tablets (20 mg total) by mouth daily. 05/14/19   Ellamay Fors, Darrelyn Hillock, FNP  rosuvastatin (CRESTOR) 10 MG tablet Take 10 mg by mouth daily.    [provider]    Family History Family History  Problem Relation Age of Onset   Hyperlipidemia Mother    Hypertension Mother    Cancer Father    Diabetes Sister    Hypertension Sister    Diabetes Brother    Heart disease Brother    Hypertension Brother    Heart attack Brother     Social History Social History   Tobacco Use   Smoking status: Current Every Day Smoker    Packs/day: 0.50    Years: 50.00    Pack years: 25.00    Types: Cigarettes   Smokeless tobacco: Never Used   Tobacco comment: pt states that she uses nic patches  Substance Use Topics   Alcohol use: Not Currently    Comment: occassionally   Drug use: No     Allergies   Latex, Tape, and Lipitor [atorvastatin]   Review of Systems Review of Systems  Constitutional: Negative.   Respiratory: Negative.   Cardiovascular: Negative.   Musculoskeletal: Positive for back pain.  Neurological: Negative.   All other systems reviewed and are negative.    Physical Exam Triage Vital Signs ED Triage Vitals  Enc Vitals Group     BP 05/14/19 1056 132/76     Pulse Rate 05/14/19 1056 99       Resp 05/14/19 1056 16     Temp 05/14/19 1056 97.7 F (36.5 C)  Temp Source 05/14/19 1056 Oral     SpO2 05/14/19 1056 95 %     Weight --      Height --      Head Circumference --      Peak Flow --      Pain Score 05/14/19 1101 8     Pain Loc --      Pain Edu? --      Excl. in [place]? --    No data found.  Updated Vital Signs BP 132/76 (BP Location: Right Arm)    Pulse 99    Temp 97.7 F (36.5 C) (Oral)    Resp 16    SpO2 95%   Visual Acuity Right Eye Distance:   Left Eye Distance:   Bilateral Distance:    Right Eye Near:   Left Eye Near:    Bilateral Near:     Physical Exam Vitals and nursing note reviewed.  Constitutional:      General: She is not in acute distress.    Appearance: Normal appearance. She is normal weight. She is not toxic-appearing.  Cardiovascular:     Rate and Rhythm: Normal rate.     Pulses: Normal pulses.     Heart sounds: Normal heart sounds.  Pulmonary:     Effort: Pulmonary effort is normal. No respiratory distress.     Breath sounds: Normal breath sounds. No stridor. No wheezing, rhonchi or rales.  Chest:     Chest wall: No tenderness.  Musculoskeletal:        General: Tenderness present. No swelling, deformity or signs of injury.     Cervical back: Normal.     Thoracic back: Normal.     Lumbar back: Spasms and tenderness present.     Right lower leg: No edema.     Left lower leg: No edema.  Neurological:     Mental Status: She is alert and oriented to person, place, and time.     Cranial Nerves: Cranial nerves are intact.     Sensory: Sensation is intact.     Motor: Motor function is intact.     Coordination: Coordination is intact.     Gait: Gait is intact.      UC Treatments / Results  Labs (all labs ordered are listed, but only abnormal results are displayed) Labs Reviewed - No data to display  EKG   Radiology No results found.  Procedures Procedures (including critical care time)  Medications Ordered in  UC Medications - No data to display  Initial Impression / Assessment and Plan / UC Course  I have reviewed the triage vital signs and the nursing notes.  Pertinent labs & imaging results that were available during my care of the patient were reviewed by me and considered in my medical decision making (see chart for details).    Patient is stable at discharge. Advised patient to continue to take ibuprofen as prescribed Short-term prednisone was prescribed Flexeril was prescribed Oral nystatin was prescribed to prevent thrush Follow-up with primary care Return for worsening of symptoms  Final Clinical Impressions(s) / UC Diagnoses   Final diagnoses:  Chronic left-sided low back pain without sciatica     Discharge Instructions     Rest, ice and heat as needed Ensure adequate ROM as tolerated. Continue ibuprofen as needed for inflammation and pain relief Prescribed flexeril  for muscle spasm.  Do not drive or operate heavy machinery while taking this medication Return here or go to ER if you  have any new or worsening symptoms such as numbness/tingling of the inner thighs, loss of bladder or bowel control, headache/blurry vision, nausea/vomiting, confusion/altered mental status, dizziness, weakness, passing out, imbalance, etc...      ED Prescriptions    Medication Sig Dispense Auth. Provider   predniSONE (DELTASONE) 10 MG tablet Take 2 tablets (20 mg total) by mouth daily. 15 tablet Lus Kriegel, Darrelyn Hillock, FNP   nystatin (MYCOSTATIN) 100000 UNIT/ML suspension Take 5 mLs (500,000 Units total) by mouth 4 (four) times daily. 120 mL Adden Strout, Darrelyn Hillock, FNP   cyclobenzaprine (FLEXERIL) 5 MG tablet Take 1 tablet (5 mg total) by mouth 3 (three) times daily as needed for muscle spasms. 20 tablet Danity Schmelzer, Darrelyn Hillock, FNP     PDMP not reviewed this encounter.   Emerson Monte, [place] 05/14/19 1122

## 2019-05-28 ENCOUNTER — Ambulatory Visit: Payer: Medicare HMO | Attending: Internal Medicine

## 2019-05-28 DIAGNOSIS — Z23 Encounter for immunization: Secondary | ICD-10-CM

## 2019-05-28 NOTE — Progress Notes (Signed)
   Covid-19 Vaccination Clinic  Name:  DELLIE CHELTON    MRN: [id number] DOB: 1945/08/23  05/28/2019  Ms. Goodine was observed post Covid-19 immunization for 15 minutes without incident. She was provided with Vaccine Information Sheet and instruction to access the V-Safe system.   Ms. Peppel was instructed to call 911 with any severe reactions post vaccine: Marland Kitchen Difficulty breathing  . Swelling of face and throat  . A fast heartbeat  . A bad rash all over body  . Dizziness and weakness   Immunizations Administered    Name Date Dose VIS Date Route   Moderna COVID-19 Vaccine 05/28/2019 11:25 AM 0.5 mL 02/19/2019 Intramuscular   Manufacturer: Moderna   Lot: [id number]   [id number]

## 2019-06-18 ENCOUNTER — Other Ambulatory Visit: Payer: Self-pay

## 2019-06-18 ENCOUNTER — Ambulatory Visit (INDEPENDENT_AMBULATORY_CARE_PROVIDER_SITE_OTHER): Payer: Medicare HMO | Admitting: Nurse Practitioner

## 2019-06-18 ENCOUNTER — Encounter (INDEPENDENT_AMBULATORY_CARE_PROVIDER_SITE_OTHER): Payer: Self-pay | Admitting: Nurse Practitioner

## 2019-06-18 ENCOUNTER — Other Ambulatory Visit (INDEPENDENT_AMBULATORY_CARE_PROVIDER_SITE_OTHER): Payer: Self-pay | Admitting: Nurse Practitioner

## 2019-06-18 VITALS — BP 140/85 | HR 65 | Temp 97.5°F | Ht 67.0 in | Wt 173.6 lb

## 2019-06-18 DIAGNOSIS — R059 Cough, unspecified: Secondary | ICD-10-CM

## 2019-06-18 DIAGNOSIS — M545 Low back pain, unspecified: Secondary | ICD-10-CM

## 2019-06-18 DIAGNOSIS — I10 Essential (primary) hypertension: Secondary | ICD-10-CM

## 2019-06-18 DIAGNOSIS — R21 Rash and other nonspecific skin eruption: Secondary | ICD-10-CM

## 2019-06-18 DIAGNOSIS — R0989 Other specified symptoms and signs involving the circulatory and respiratory systems: Secondary | ICD-10-CM

## 2019-06-18 DIAGNOSIS — R05 Cough: Secondary | ICD-10-CM | POA: Diagnosis not present

## 2019-06-18 DIAGNOSIS — E559 Vitamin D deficiency, unspecified: Secondary | ICD-10-CM

## 2019-06-18 LAB — COMPLETE METABOLIC PANEL WITH GFR
AG Ratio: 1.2 (calc) (ref 1.0–2.5)
ALT: 11 U/L (ref 6–29)
AST: 15 U/L (ref 10–35)
Albumin: 3.9 g/dL (ref 3.6–5.1)
Alkaline phosphatase (APISO): 49 U/L (ref 37–153)
BUN: 17 mg/dL (ref 7–25)
CO2: 32 mmol/L (ref 20–32)
Calcium: 9.5 mg/dL (ref 8.6–10.4)
Chloride: 97 mmol/L — ABNORMAL LOW (ref 98–110)
Creat: 0.93 mg/dL (ref 0.60–0.93)
GFR, Est African American: 71 mL/min/{1.73_m2} (ref >=60)
GFR, Est Non African American: 61 mL/min/{1.73_m2} (ref >=60)
Globulin: 3.2 g/dL (calc) (ref 1.9–3.7)
Glucose, Bld: 142 mg/dL — ABNORMAL HIGH (ref 65–99)
Potassium: 4.6 mmol/L (ref 3.5–5.3)
Sodium: 134 mmol/L — ABNORMAL LOW (ref 135–146)
Total Bilirubin: 0.4 mg/dL (ref 0.2–1.2)
Total Protein: 7.1 g/dL (ref 6.1–8.1)

## 2019-06-18 LAB — VITAMIN D 25 HYDROXY (VIT D DEFICIENCY, FRACTURES): Vit D, 25-Hydroxy: 47 ng/mL (ref 30–100)

## 2019-06-18 NOTE — Progress Notes (Addendum)
Subjective:  Patient ID: Leslie [name], female    DOB: 09/22/1945  Age: 74 y.o. MRN: [id number]  CC:  Chief Complaint  Patient presents with  . Hypertension  . vitamin d deficiency  . Follow-up    Low back pain, cough, carotid bruit  . Rash    under eyes      HPI  This patient arrives today for the above.  Hypertension: She continues on her antihypertensives which are hydrochlorothiazide and losartan.  She tolerating these well.  Vitamin D deficiency: She has a history of vitamin D deficiency and is currently taking 10,000 IUs of vitamin D3 daily.  Last serum level was collected in November 2020 and it was 23.  Low back pain: She has a history of chronic low back pain.  She did have surgery on her back in 2000 by Dr. Ellene Route in [place].  Over the last month she has been experiencing intermittent severe pain.  She tells me standing up for long periods of time worsen the pain.  She has been taking ibuprofen 800 mg as needed and Flexeril as needed with very minimal relief in her symptoms.  She denies any sensation changes to her lower extremities or groin, new bowel or bladder incontinence, or weakness.  Cough: She has also been experiencing a cough when she eats.  She denies difficulty with swallowing or pain with swallowing.  She denies sensation of food getting caught in her throat.  She was started on pantoprazole to see if this could be GERD, and she has been taking 40 mg daily.  Her symptoms have not improved with this medication.  Carotid bruit: Was also noted that she has a carotid bruit and heart murmur on her last exam.  She did undergo cardiac echocardiogram with no major abnormalities noted, ultrasound of bilateral carotid arteries was ordered but has not yet been completed.  She does tell me she has a history of bilateral carotid artery stenosis.  She tells me she had carotid endarterectomy on her right carotid artery back in 2013, but left carotid artery stenosis was not  significant enough for intervention at that time.  Rash: She also mentions of rash on both of her cheeks, left worse than right.  She tells me the rash has been progressing for about 1 year.  She has not tried any treatments for this.  It does not itch or bother her, but cosmetically it is worrisome for her.   Past Medical History:  Diagnosis Date  . Back pain    DDD  . Bruises easily    pt attributes this to Plavix and ASA  . Carotid artery occlusion   . Coronary artery disease    no definite diagnosis found as of 04/17/12; no ischemia, EF 48% by Myoview stress 12/29/11 (Dr. Gwenlyn Found)  . Cough    phlem but no odor noted  . Dry skin   . History of bruising easily   . History of colon polyps   . History of migraine    last one at least 28month ago  . HTN (hypertension), benign    takes Losartan daily  . Hyperlipidemia    takes Atorvastatin daily  . [place]   . Panic attack    on the [id number] died(281month  . Pneumonia    several yrs ago  . Rheumatoid arthritis(714.0)   . Tobacco abuse       Family History  Problem Relation Age  of Onset  . Hyperlipidemia Mother   . Hypertension Mother   . Cancer Father   . Diabetes Sister   . Hypertension Sister   . Diabetes Brother   . Heart disease Brother   . Hypertension Brother   . Heart attack Brother     Social History   Social History Narrative   Divorced.Lives alone.Works at [employer].   Social History   Tobacco Use  . Smoking status: Current Every Day Smoker    Packs/day: 0.50    Years: 50.00    Pack years: 25.00    Types: Cigarettes  . Smokeless tobacco: Never Used  . Tobacco comment: pt states that she uses nic patches  Substance Use Topics  . Alcohol use: Not Currently    Comment: occassionally     Current Meds  Medication Sig  . aspirin EC 81 MG tablet Take 81 mg by mouth daily.  . cilostazol (PLETAL) 100 MG tablet Take 1 tablet (100 mg total) by mouth 2  (two) times daily.  . hydrochlorothiazide (HYDRODIURIL) 12.5 MG tablet Take 12.5 mg by mouth daily.  Marland Kitchen ibuprofen (ADVIL) 800 MG tablet Take 1 tablet (800 mg total) by mouth every 8 (eight) hours as needed.  Marland Kitchen losartan (COZAAR) 25 MG tablet Take 25 mg by mouth daily.  . rosuvastatin (CRESTOR) 10 MG tablet Take 10 mg by mouth daily.    ROS:  Review of Systems  Eyes: Negative for blurred vision and double vision.  Respiratory: Negative for cough, shortness of breath and wheezing.   Cardiovascular: Negative for chest pain and palpitations.  Gastrointestinal: Negative for heartburn.  Musculoskeletal: Positive for back pain.  Skin: Positive for rash.  Neurological: Negative for dizziness, sensory change, focal weakness and headaches.     Objective:   Today's Vitals: BP 140/85 (BP Location: Right Arm, Patient Position: Sitting, Cuff Size: Normal)   Pulse 65   Temp (!) 97.5 F (36.4 C) (Temporal)   Ht 5' 7"  (1.702 m)   Wt 173 lb 9.6 oz (78.7 kg)   SpO2 95%   BMI 27.19 kg/m  Vitals with BMI 06/18/2019 05/14/2019 05/13/2019  Height 5' 7"  - (No Data)  Weight 173 lbs 10 oz - (No Data)  BMI 06.26 - -  Systolic 948 546 (No Data)  Diastolic 85 76 (No Data)  Pulse 65 99 -     Physical Exam Vitals reviewed.  Constitutional:      General: She is not in acute distress.    Appearance: Normal appearance.  HENT:     Head: Normocephalic and atraumatic.  Neck:     Vascular: Carotid bruit (bilateral) present.  Cardiovascular:     Rate and Rhythm: Normal rate and regular rhythm.     Pulses: Normal pulses.     Heart sounds: Murmur present.  Pulmonary:     Effort: Pulmonary effort is normal.     Breath sounds: Normal breath sounds.  Skin:    General: Skin is warm and dry.     Findings: Rash present. Rash is macular.       Neurological:     General: No focal deficit present.     Mental Status: She is alert and oriented to person, place, and time.  Psychiatric:        Mood and Affect:  Mood normal.        Behavior: Behavior normal.        Judgment: Judgment normal.          Assessment  and Plan   1. Essential hypertension   2. Vitamin D deficiency disease   3. Rash   4. Cough   5. Low back pain without sciatica, unspecified back pain laterality, unspecified chronicity   6. Bilateral carotid bruits      Plan: 1.  She will continue on her antihypertensive as prescribed.  She does tell me that she has not taken her medications yet today, which may explain why her blood pressure is slightly elevated today.  2.  We will collect serum level of vitamin D for further evaluation today.  3.  She does have a malar rash, but does not seem to have other symptoms of lupus, thus we will first refer to dermatology for further assistance regarding diagnosis and management of rash.  If etiology does not appear to be dermatologic in nature we will pursue further work-up.  4.  I will refer her to gastroenterology for further evaluation and management.  5.  She will continue using her conservative treatment with ibuprofen and Flexeril as needed.  I did let her know that if she experiences any weakness, sensation changes, saddle paresthesias, or new onset of incontinence that she should notify us right away and if we are not available she should proceed to the emergency department.  Tells me she understands.  6.  I will check with our medical assistant to see why this test has not been scheduled yet, and will try to get this scheduled for further evaluation.   Tests ordered Orders Placed This Encounter  Procedures  . Vitamin D, 25-hydroxy  . CMP with eGFR([facility])  . Ambulatory referral to Dermatology  . Ambulatory referral to Gastroenterology      No orders of the defined types were placed in this encounter.   Patient to follow-up in 6 weeks or sooner as needed.  Ailene Ards, NP

## 2019-06-19 ENCOUNTER — Encounter (INDEPENDENT_AMBULATORY_CARE_PROVIDER_SITE_OTHER): Payer: Self-pay | Admitting: Nurse Practitioner

## 2019-06-19 ENCOUNTER — Encounter: Payer: Self-pay | Admitting: Internal Medicine

## 2019-06-25 ENCOUNTER — Ambulatory Visit (HOSPITAL_COMMUNITY)
Admission: RE | Admit: 2019-06-25 | Discharge: 2019-06-25 | Disposition: A | Discharge status: Home / Self Care | Payer: Medicare HMO | Source: Ambulatory Visit | Attending: Nurse Practitioner | Admitting: Nurse Practitioner

## 2019-06-25 ENCOUNTER — Other Ambulatory Visit: Payer: Self-pay

## 2019-06-25 DIAGNOSIS — R0989 Other specified symptoms and signs involving the circulatory and respiratory systems: Secondary | ICD-10-CM | POA: Insufficient documentation

## 2019-06-25 DIAGNOSIS — I6523 Occlusion and stenosis of bilateral carotid arteries: Secondary | ICD-10-CM | POA: Diagnosis not present

## 2019-06-26 ENCOUNTER — Encounter (INDEPENDENT_AMBULATORY_CARE_PROVIDER_SITE_OTHER): Payer: Self-pay | Admitting: Nurse Practitioner

## 2019-06-26 DIAGNOSIS — I6522 Occlusion and stenosis of left carotid artery: Secondary | ICD-10-CM

## 2019-07-03 ENCOUNTER — Other Ambulatory Visit: Payer: Self-pay

## 2019-07-03 ENCOUNTER — Encounter: Payer: Self-pay | Admitting: Gastroenterology

## 2019-07-03 ENCOUNTER — Ambulatory Visit: Payer: Medicare HMO | Admitting: Gastroenterology

## 2019-07-03 DIAGNOSIS — R05 Cough: Secondary | ICD-10-CM | POA: Insufficient documentation

## 2019-07-03 DIAGNOSIS — R1312 Dysphagia, oropharyngeal phase: Secondary | ICD-10-CM

## 2019-07-03 DIAGNOSIS — R059 Cough, unspecified: Secondary | ICD-10-CM

## 2019-07-03 NOTE — Progress Notes (Unsigned)
mbs

## 2019-07-03 NOTE — Progress Notes (Signed)
Primary Care Physician:  Doree Albee, MD  Primary Gastroenterologist:  Garfield Cornea, MD   Chief Complaint  Patient presents with  . Cough    anytime she starts to eat, going on 2 years    HPI:  Leslie [name] is a 74 y.o. female here at the request of Jeralyn Ruths, NP with Dr. Anastasio Champion for further evaluation of cough.  Patient last seen in 2006 for anemia and GI bleeding. EGD and colonoscopy January 2006 showed tiny distal esophageal erosions, prepyloric antral erosions/ulcerations, internal hemorrhoids, diminutive polyp in the rectosigmoid junction.  Pathology unavailable to me at this time but according to subsequent notes was hyperplastic.  She did have H. pylori and was treated.  Follow-up EGD in May 2006, previously noted ulcers had completely healed.  EGD was normal.  Past surgical history mentions prior Givens capsule but did not see any indication that she is have this done.  Patient has a 2-year history of coughing regularly noted with eating solid food.  Does not seem to be triggered by liquids or taking her medication.  She admits to sometimes is triggered by her first puff on a cigarette.  She has been treated for possible reflux, pantoprazole 40 mg daily for the past 2 months.  No significant improvement.  She was told that it might be allergies, she tried Zyrtec without relief.  She denies waking up in the morning with coughing.  No heartburn.  No abdominal pain.  Occasional constipation.  No melena or rectal bleeding.  When eats, feels like something hits in the back of her throat, triggers coughing.  Cough is dry.  Almost like irritation.  Gets embarrassed by it when she tries to eat with friends.  Until the sensation goes away the coughing will continue.    No prior ENT evaluation.  Chest CT angio December 2018 for shortness of breath: Emphysema, mild interstitial and peribronchial thickening suggesting COPD exacerbation.  No PE.  Pulmonary artery enlargement suggesting  pulmonary hypertension.    Current Outpatient Medications  Medication Sig Dispense Refill  . aspirin EC 81 MG tablet Take 81 mg by mouth daily.    . cilostazol (PLETAL) 100 MG tablet Take 1 tablet (100 mg total) by mouth 2 (two) times daily. 60 tablet 6  . hydrochlorothiazide (HYDRODIURIL) 12.5 MG tablet Take 12.5 mg by mouth daily.    Marland Kitchen ibuprofen (ADVIL) 800 MG tablet Take 1 tablet (800 mg total) by mouth every 8 (eight) hours as needed. 30 tablet 2  . losartan (COZAAR) 25 MG tablet Take 25 mg by mouth daily.    . pantoprazole (PROTONIX) 40 MG tablet Take 1 tablet (40 mg total) by mouth daily. 30 tablet 3  . rosuvastatin (CRESTOR) 10 MG tablet Take 10 mg by mouth daily.     No current facility-administered medications for this visit.    Allergies as of 07/03/2019 - Review Complete 07/03/2019  Allergen Reaction Noted  . Latex Other (See Comments) 10/03/2011  . Tape Other (See Comments) and Itching 10/03/2011  . Atorvastatin Other (See Comments) 10/04/2011    Past Medical History:  Diagnosis Date  . Back pain    DDD  . Bruises easily    pt attributes this to Plavix and ASA  . Carotid artery occlusion   . Coronary artery disease    no definite diagnosis found as of 04/17/12; no ischemia, EF 48% by Myoview stress 12/29/11 (Dr. Gwenlyn Found)  . Cough    phlem but no odor noted  .  Dry skin   . History of bruising easily   . History of colon polyps   . History of migraine    last one at least 37months ago  . HTN (hypertension), benign    takes Losartan daily  . Hyperlipidemia    takes Atorvastatin daily  . PAD (peripheral artery disease) ([place])   . Panic attack    on the 04-29-2022 Granddaughter died(34months)  . Pneumonia    several yrs ago  . Rheumatoid arthritis(714.0)   . Tobacco abuse     Past Surgical History:  Procedure Laterality Date  . ABDOMINAL HYSTERECTOMY    . ANGIOPLASTY  04/20/2012   Procedure: ANGIOPLASTY;  Surgeon: Serafina Mitchell, MD;  Location: [facility] OR;   Service: Vascular;  Laterality: Right;  Using 1cm x6 cm vascu-guard patch   . BACK SURGERY    . CARDIAC CATHETERIZATION  01/31/2005  . CAROTID ANGIOGRAM N/A 01/06/2012   Procedure: CAROTID ANGIOGRAM;  Surgeon: Lorretta Harp, MD;  Location: [facility] CATH LAB;  Service: Cardiovascular;  Laterality: N/A;  . CARPAL TUNNEL RELEASE    . CHOLECYSTECTOMY    . COLONOSCOPY    . CORONARY ANGIOPLASTY  01/06/12/2005/2006    2 stents  . EGD/TCS  03/2004   Dr. Gala Romney: Tiny distal esophageal erosions, prepyloric antral erosions/ulcerations, internal hemorrhoids, diminutive polyp in the rectosigmoid junction removed (hyperplastic).  Pathology not available but reportedly she had H. pylori which was treated.  Marland Kitchen ENDARTERECTOMY  04/20/2012   Procedure: ENDARTERECTOMY CAROTID;  Surgeon: Serafina Mitchell, MD;  Location: [place];  Service: Vascular;  Laterality: Right;  . ESOPHAGOGASTRODUODENOSCOPY  07/2004   Dr. Gala Romney: Previous antral ulcer was completely healed.  EGD normal.  . GIVENS CAPSULE STUDY     ???  . leg stents    . LOWER EXTREMITY ANGIOGRAM N/A 01/06/2012   Procedure: LOWER EXTREMITY ANGIOGRAM;  Surgeon: Lorretta Harp, MD;  Location: [facility] CATH LAB;  Service: Cardiovascular;  Laterality: N/A;  . TOTAL KNEE ARTHROPLASTY    . TRIGGER FINGER RELEASE    . ulnar nerve surgery      Family History  Problem Relation Age of Onset  . Hyperlipidemia Mother   . Hypertension Mother   . Cancer Father        Brain  . Diabetes Sister   . Hypertension Sister   . Diabetes Brother   . Heart disease Brother   . Hypertension Brother   . Heart attack Brother   . Colon cancer Neg Hx     Social History   Socioeconomic History  . Marital status: Divorced    Spouse name: Not on file  . Number of children: Not on file  . Years of education: Not on file  . Highest education level: Not on file  Occupational History  . Not on file  Tobacco Use  . Smoking status: Current Every Day Smoker    Packs/day: 0.50     Years: 50.00    Pack years: 25.00    Types: Cigarettes  . Smokeless tobacco: Never Used  . Tobacco comment: pt states that she uses nic patches  Substance and Sexual Activity  . Alcohol use: Not Currently    Comment: occassionally  . Drug use: No  . Sexual activity: Not on file  Other Topics Concern  . Not on file  Social History Narrative   Divorced.Lives alone.Works at [employer].   Social Determinants of Health   Financial Resource Strain:   . Difficulty of  Paying Living Expenses:   Food Insecurity:   . Worried About Charity fundraiser in the Last Year:   . Arboriculturist in the Last Year:   Transportation Needs:   . Film/video editor (Medical):   Marland Kitchen Lack of Transportation (Non-Medical):   Physical Activity:   . Days of Exercise per Week:   . Minutes of Exercise per Session:   Stress:   . Feeling of Stress :   Social Connections:   . Frequency of Communication with Friends and Family:   . Frequency of Social Gatherings with Friends and Family:   . Attends Religious Services:   . Active Member of Clubs or Organizations:   . Attends Archivist Meetings:   Marland Kitchen Marital Status:   Intimate Partner Violence:   . Fear of Current or Ex-Partner:   . Emotionally Abused:   Marland Kitchen Physically Abused:   . Sexually Abused:       ROS:  General: Negative for anorexia, weight loss, fever, chills, fatigue, weakness. Eyes: Negative for vision changes.  ENT: Negative for hoarseness, difficulty swallowing , nasal congestion. CV: Negative for chest pain, angina, palpitations, dyspnea on exertion, peripheral edema.  Respiratory: Negative for dyspnea at rest, dyspnea on exertion,   sputum, wheezing. See hpi GI: See history of present illness. GU:  Negative for dysuria, hematuria, urinary incontinence, urinary frequency, nocturnal urination.  MS: Negative for joint pain, low back pain.  Derm: Negative for rash or itching.  Neuro: Negative for weakness, abnormal sensation,  seizure, frequent headaches, memory loss, confusion.  Psych: Negative for anxiety, depression, suicidal ideation, hallucinations.  Endo: Negative for unusual weight change.  Heme: Negative for bruising or bleeding. Allergy: Negative for rash or hives.    Physical Examination:  BP (!) 141/85   Pulse 98   Temp 98.2 F (36.8 C) (Oral)   Ht 5\' 7"  (1.702 m)   Wt 171 lb 9.6 oz (77.8 kg)   BMI 26.88 kg/m    General: Well-nourished, well-developed in no acute distress.  Head: Normocephalic, atraumatic.   Eyes: Conjunctiva pink, no icterus. Mouth: Oropharyngeal mucosa moist and pink , no lesions erythema or exudate. Neck: Supple without thyromegaly, masses, or lymphadenopathy.  Lungs: Clear to auscultation bilaterally.  Heart: Regular rate and rhythm, no murmurs rubs or gallops.  Abdomen: Bowel sounds are normal, nontender, nondistended, no hepatosplenomegaly or masses, no abdominal bruits or    hernia , no rebound or guarding.   Rectal: not performed Extremities: No lower extremity edema. No clubbing or deformities.  Neuro: Alert and oriented x 4 , grossly normal neurologically.  Skin: Warm and dry, no rash or jaundice.   Psych: Alert and cooperative, normal mood and affect.  Labs: Lab Results  Component Value Date   CREATININE 0.93 06/18/2019   BUN 17 06/18/2019   NA 134 (L) 06/18/2019   K 4.6 06/18/2019   CL 97 (L) 06/18/2019   CO2 32 06/18/2019   Lab Results  Component Value Date   ALT 11 06/18/2019   AST 15 06/18/2019   ALKPHOS 50 03/20/2015   BILITOT 0.4 06/18/2019   Lab Results  Component Value Date   TSH 0.26 (L) 02/04/2019    Lab Results  Component Value Date   WBC 6.9 02/04/2019   HGB 12.8 02/04/2019   HCT 39.2 02/04/2019   MCV 86.0 02/04/2019   PLT 271 02/04/2019    Imaging Studies: US Carotid Duplex Bilateral  Result Date: 06/25/2019 CLINICAL DATA:  History of  right carotid stenosis and prior right carotid endarterectomy in 2014. Carotid bruit it.  History of hypertension, hyperlipidemia and coronary artery disease. EXAM: BILATERAL CAROTID DUPLEX ULTRASOUND TECHNIQUE: Pearline Cables scale imaging, color Doppler and duplex ultrasound were performed of bilateral carotid and vertebral arteries in the neck. COMPARISON:  Report from a prior carotid ultrasound on 07/15/2013 performed at [facility] and [facility] of the neck on 10/03/2011 FINDINGS: Criteria: Quantification of carotid stenosis is based on velocity parameters that correlate the residual internal carotid diameter with NASCET-based stenosis levels, using the diameter of the distal internal carotid lumen as the denominator for stenosis measurement. The following velocity measurements were obtained: RIGHT ICA:  119/23 cm/sec CCA:  [id number] cm/sec SYSTOLIC ICA/CCA RATIO:  1.5 ECA:  73 cm/sec LEFT ICA:  194/57 cm/sec CCA:  [id number] cm/sec SYSTOLIC ICA/CCA RATIO:  4.1 ECA:  326 cm/sec RIGHT CAROTID ARTERY: There is some intimal thickening at the level of the common carotid artery and carotid bulb. No significant focal plaque. Endarterectomy site shows normal patency with no evidence of recurrent right-sided carotid stenosis. RIGHT VERTEBRAL ARTERY: Antegrade flow with normal waveform and velocity. LEFT CAROTID ARTERY: Moderate calcified plaque present at the level of the left carotid bulb and proximal left ICA. Estimated left ICA stenosis is 50-69%. Elevated velocities at the external carotid origin are consistent with at least moderate grade external carotid stenosis. LEFT VERTEBRAL ARTERY: Antegrade flow with normal waveform and velocity. IMPRESSION: 1. Moderate plaque at the level of the left carotid bulb and proximal left ICA with estimated 50-69% left ICA stenosis. 2. No evidence of recurrent right-sided carotid stenosis after prior endarterectomy. Electronically Signed   By: Aletta Edouard M.D.   On: 06/25/2019 13:40

## 2019-07-03 NOTE — Patient Instructions (Signed)
1. Please see the speech therapist for swallowing test as discussed. We will contact you with results as available.

## 2019-07-04 ENCOUNTER — Other Ambulatory Visit (HOSPITAL_COMMUNITY): Payer: Self-pay | Admitting: Specialist

## 2019-07-04 DIAGNOSIS — R059 Cough, unspecified: Secondary | ICD-10-CM

## 2019-07-04 DIAGNOSIS — R05 Cough: Secondary | ICD-10-CM

## 2019-07-04 DIAGNOSIS — R1312 Dysphagia, oropharyngeal phase: Secondary | ICD-10-CM

## 2019-07-07 NOTE — Assessment & Plan Note (Signed)
74 y/o female with two year history of coughing after eating solid foods. Treated for possible reflux, with PPI daily for past two months, with no improvement. No improvement with allergy medication. She denies heartburn or regurgitation. ?oropharyngeal dysphagia as source of symptoms. At this time EGD likely would be low yield as she denies typical heartburn or dysphagia. Make arrangements for possible modified barium swallow study as first step.

## 2019-07-08 ENCOUNTER — Encounter (HOSPITAL_COMMUNITY): Payer: Self-pay | Admitting: Speech Pathology

## 2019-07-08 ENCOUNTER — Other Ambulatory Visit: Payer: Self-pay

## 2019-07-08 ENCOUNTER — Ambulatory Visit (HOSPITAL_COMMUNITY): Payer: Medicare HMO | Attending: Gastroenterology | Admitting: Speech Pathology

## 2019-07-08 ENCOUNTER — Ambulatory Visit (HOSPITAL_COMMUNITY)
Admission: RE | Admit: 2019-07-08 | Discharge: 2019-07-08 | Disposition: A | Discharge status: Home / Self Care | Payer: Medicare HMO | Source: Ambulatory Visit | Attending: Gastroenterology | Admitting: Gastroenterology

## 2019-07-08 DIAGNOSIS — R05 Cough: Secondary | ICD-10-CM | POA: Diagnosis not present

## 2019-07-08 DIAGNOSIS — R1312 Dysphagia, oropharyngeal phase: Secondary | ICD-10-CM | POA: Diagnosis not present

## 2019-07-08 DIAGNOSIS — K219 Gastro-esophageal reflux disease without esophagitis: Secondary | ICD-10-CM | POA: Diagnosis not present

## 2019-07-08 DIAGNOSIS — R1310 Dysphagia, unspecified: Secondary | ICD-10-CM | POA: Diagnosis not present

## 2019-07-08 DIAGNOSIS — R059 Cough, unspecified: Secondary | ICD-10-CM

## 2019-07-08 NOTE — Therapy (Signed)
[place], [place], [address] Phone: [phone]   Fax:  [phone]  Modified Barium Swallow  Patient Details  Name: Leslie [name] MRN: [id number] Date of Birth: [birth year] No data recorded  Encounter Date: 07/08/2019  End of Session - 07/08/19 1602    Visit Number  1    Number of Visits  1    Authorization Type  [employer]    SLP Start Time  [id number]    SLP Stop Time   [id number]    SLP Time Calculation (min)  30 min    Activity Tolerance  Patient tolerated treatment well       Past Medical History:  Diagnosis Date  . Back pain    DDD  . Bruises easily    pt attributes this to Plavix and ASA  . Carotid artery occlusion   . Coronary artery disease    no definite diagnosis found as of 04/17/12; no ischemia, EF 48% by Myoview stress 12/29/11 (Dr. Gwenlyn Found)  . Cough    phlem but no odor noted  . Dry skin   . History of bruising easily   . History of colon polyps   . History of migraine    last one at least 46months ago  . HTN (hypertension), benign    takes Losartan daily  . Hyperlipidemia    takes Atorvastatin daily  . PAD (peripheral artery disease) ([place])   . Panic attack    on the Apr 29, 2022 Granddaughter died(61months)  . Pneumonia    several yrs ago  . Rheumatoid arthritis(714.0)   . Tobacco abuse     Past Surgical History:  Procedure Laterality Date  . ABDOMINAL HYSTERECTOMY    . ANGIOPLASTY  04/20/2012   Procedure: ANGIOPLASTY;  Surgeon: Serafina Mitchell, MD;  Location: [facility] OR;  Service: Vascular;  Laterality: Right;  Using 1cm x6 cm vascu-guard patch   . BACK SURGERY    . CARDIAC CATHETERIZATION  01/31/2005  . CAROTID ANGIOGRAM N/A 01/06/2012   Procedure: CAROTID ANGIOGRAM;  Surgeon: Lorretta Harp, MD;  Location: [facility] CATH LAB;  Service: Cardiovascular;  Laterality: N/A;  . CARPAL TUNNEL RELEASE    . CHOLECYSTECTOMY    . COLONOSCOPY    . CORONARY ANGIOPLASTY  01/06/12/2005/2006    2 stents  .  EGD/TCS  03/2004   Dr. Gala Romney: Tiny distal esophageal erosions, prepyloric antral erosions/ulcerations, internal hemorrhoids, diminutive polyp in the rectosigmoid junction removed (hyperplastic).  Pathology not available but reportedly she had H. pylori which was treated.  Marland Kitchen ENDARTERECTOMY  04/20/2012   Procedure: ENDARTERECTOMY CAROTID;  Surgeon: Serafina Mitchell, MD;  Location: [place];  Service: Vascular;  Laterality: Right;  . ESOPHAGOGASTRODUODENOSCOPY  07/2004   Dr. Gala Romney: Previous antral ulcer was completely healed.  EGD normal.  . GIVENS CAPSULE STUDY     ???  . leg stents    . LOWER EXTREMITY ANGIOGRAM N/A 01/06/2012   Procedure: LOWER EXTREMITY ANGIOGRAM;  Surgeon: Lorretta Harp, MD;  Location: [facility] CATH LAB;  Service: Cardiovascular;  Laterality: N/A;  . TOTAL KNEE ARTHROPLASTY    . TRIGGER FINGER RELEASE    . ulnar nerve surgery      There were no vitals filed for this visit.  Subjective Assessment - 07/08/19 1556    Subjective  "Sometimes I cough when I am eating solid foods."    Special Tests  MBSS    Currently in Pain?  No/denies  General - 07/08/19 1557      General Information   HPI  Leslie [name] is a 74 yo female who was referred for MBSS by Neil Crouch, PA-C (GI) due to Pt report of coughing after eating solid foods. She has been taking a PPI, however denies symptoms of reflux.    Type of Study  MBS-Modified Barium Swallow Study    Diet Prior to this Study  Regular;Thin liquids    Temperature Spikes Noted  No    Respiratory Status  Room air    History of Recent Intubation  No    Behavior/Cognition  Alert;Cooperative;Pleasant mood    Oral Cavity Assessment  Within Functional Limits    Oral Care Completed by SLP  No    Oral Cavity - Dentition  Dentures, bottom;Dentures, top    Vision  Functional for self feeding    Self-Feeding Abilities  Able to feed self    Patient Positioning  Upright in chair    Baseline Vocal Quality  Normal    Volitional Cough   Strong    Volitional Swallow  Able to elicit    Anatomy  Within functional limits    Pharyngeal Secretions  Not observed secondary MBS         Oral Preparation/Oral Phase - 07/08/19 1600      Oral Preparation/Oral Phase   Oral Phase  Within functional limits      Electrical stimulation - Oral Phase   Was Electrical Stimulation Used  No       Pharyngeal Phase - 07/08/19 1600      Pharyngeal Phase   Pharyngeal Phase  Impaired      Pharyngeal - Thin   Pharyngeal- Thin Teaspoon  Within functional limits    Pharyngeal- Thin Cup  Swallow initiation at vallecula;Swallow initiation at pyriform sinus;Penetration/Aspiration during swallow    Pharyngeal  Material does not enter airway;Material enters airway, remains ABOVE vocal cords then ejected out    Pharyngeal- Thin Straw  Within functional limits      Pharyngeal - Solids   Pharyngeal- Puree  Within functional limits    Pharyngeal- Regular  Within functional limits    Pharyngeal- Pill  Within functional limits      Electrical Stimulation - Pharyngeal Phase   Was Electrical Stimulation Used  No       Cricopharyngeal Phase - 07/08/19 1601      Cervical Esophageal Phase   Cervical Esophageal Phase  Within functional limits         Plan - 07/08/19 1603    Clinical Impression Statement  Pt presents with normal oropharyngeal swallow with min delay in swallow trigger after filling the valleculae (can be normal for age), adequate hyolaryngeal excursion, flash penetration of thins x 1 during the swallow without aspiration (only underepiglottic coating) and no residuals post swallow. Pt expressed frustration that she did not "cough" during her evaluation today. SLP suggested that Pt keep a food journal to document what foods seem to cause her difficulty. She indicates mild xerostomia and sucks on peppermints. SLP further recommended that pt add moisture to foods and/or sips of water and to try chewing sugarless gum instead of the  peppermint candy to help with dry mouth. No further SLP services indicated at this time.    Consulted and Agree with Plan of Care  Patient       Patient will benefit from skilled therapeutic intervention in order to improve the following deficits and impairments:   Dysphagia, oropharyngeal phase  Recommendations/Treatment - 07/08/19 1601      Swallow Evaluation Recommendations   SLP Diet Recommendations  Age appropriate regular;Thin    Liquid Administration via  Cup;Straw    Medication Administration  Whole meds with liquid    Supervision  Patient able to self feed    Postural Changes  Seated upright at 90 degrees;Remain upright for at least 30 minutes after feeds/meals       Prognosis - 07/08/19 1602      Prognosis   Prognosis for Safe Diet Advancement  Good      Individuals Consulted   Consulted and Agree with Results and Recommendations  Patient    Report Sent to   Referring physician       Problem List Patient Active Problem List   Diagnosis Date Noted  . Cough   . Oropharyngeal dysphagia   . Vitamin D deficiency disease 06/18/2019  . CHF (congestive heart failure) ([place]) 03/19/2015  . Hypertension 03/19/2015  . Peripheral vascular disease ([place]) 03/19/2015  . Congestive heart failure (CHF) ([place]) 03/19/2015  . Aftercare following surgery of the circulatory system, [place] 05/14/2012  . Carotid artery stenosis 03/12/2012  . Non compliance w medication regimen secondary to cost 10/04/2011  . Arthritis, rheumatoid ([place]) 10/04/2011  . Dizziness 10/03/2011  . PAD, high grade RICA by MRI 10/03/11 10/03/2011  . Tobacco abuse 10/03/2011  . LVH (left ventricular hypertrophy) 10/03/2011  . Renal artery stenosis: left 50% 2005 10/03/2011  . HLD (hyperlipidemia) 11/21/2007   Thank you,  Genene Churn, [place]  [place] 07/08/2019, 4:03 PM  [place] [address] [place], [place], [address] Phone:  [phone]   Fax:  [phone]  Name: Leslie [name] MRN: [id number] Date of Birth: [birth year]

## 2019-07-16 ENCOUNTER — Other Ambulatory Visit: Payer: Self-pay

## 2019-07-16 DIAGNOSIS — R1312 Dysphagia, oropharyngeal phase: Secondary | ICD-10-CM

## 2019-07-16 DIAGNOSIS — R059 Cough, unspecified: Secondary | ICD-10-CM

## 2019-07-16 DIAGNOSIS — R05 Cough: Secondary | ICD-10-CM

## 2019-07-22 ENCOUNTER — Other Ambulatory Visit: Payer: Self-pay

## 2019-07-22 ENCOUNTER — Ambulatory Visit (HOSPITAL_COMMUNITY)
Admission: RE | Admit: 2019-07-22 | Discharge: 2019-07-22 | Disposition: A | Discharge status: Home / Self Care | Payer: Medicare HMO | Source: Ambulatory Visit | Attending: Gastroenterology | Admitting: Gastroenterology

## 2019-07-22 DIAGNOSIS — K219 Gastro-esophageal reflux disease without esophagitis: Secondary | ICD-10-CM | POA: Diagnosis not present

## 2019-07-22 DIAGNOSIS — R05 Cough: Secondary | ICD-10-CM | POA: Insufficient documentation

## 2019-07-22 DIAGNOSIS — R1312 Dysphagia, oropharyngeal phase: Secondary | ICD-10-CM | POA: Diagnosis not present

## 2019-07-22 DIAGNOSIS — R059 Cough, unspecified: Secondary | ICD-10-CM

## 2019-07-22 DIAGNOSIS — R1310 Dysphagia, unspecified: Secondary | ICD-10-CM | POA: Diagnosis not present

## 2019-07-26 ENCOUNTER — Other Ambulatory Visit (INDEPENDENT_AMBULATORY_CARE_PROVIDER_SITE_OTHER): Payer: Self-pay | Admitting: Internal Medicine

## 2019-07-30 ENCOUNTER — Other Ambulatory Visit: Payer: Self-pay

## 2019-07-30 ENCOUNTER — Encounter (INDEPENDENT_AMBULATORY_CARE_PROVIDER_SITE_OTHER): Payer: Self-pay | Admitting: Internal Medicine

## 2019-07-30 ENCOUNTER — Ambulatory Visit (INDEPENDENT_AMBULATORY_CARE_PROVIDER_SITE_OTHER): Payer: Medicare HMO | Admitting: Internal Medicine

## 2019-07-30 VITALS — BP 128/71 | HR 66 | Temp 96.6°F | Resp 18 | Ht 67.0 in | Wt 172.0 lb

## 2019-07-30 DIAGNOSIS — E782 Mixed hyperlipidemia: Secondary | ICD-10-CM | POA: Diagnosis not present

## 2019-07-30 DIAGNOSIS — I10 Essential (primary) hypertension: Secondary | ICD-10-CM

## 2019-07-30 DIAGNOSIS — R0989 Other specified symptoms and signs involving the circulatory and respiratory systems: Secondary | ICD-10-CM

## 2019-07-30 DIAGNOSIS — E559 Vitamin D deficiency, unspecified: Secondary | ICD-10-CM | POA: Diagnosis not present

## 2019-07-30 MED ORDER — OMEPRAZOLE 40 MG PO CPDR: 40.0000 mg | DELAYED_RELEASE_CAPSULE | Freq: Every day | ORAL | 3 refills | Status: DC

## 2019-07-30 MED ORDER — LOSARTAN POTASSIUM 50 MG PO TABS: 50.0000 mg | ORAL_TABLET | Freq: Every day | ORAL | 0 refills | Status: DC

## 2019-07-30 NOTE — Progress Notes (Signed)
Metrics: Intervention Frequency ACO  Documented Smoking Status Yearly  Screened one or more times in 24 months  Cessation Counseling or  Active cessation medication Past 24 months  Past 24 months   Guideline developer: UpToDate (See UpToDate for funding source) Date Released: 2014       Wellness Office Visit  Subjective:  Patient ID: Leslie [name], female    DOB: 1945-09-20  Age: 74 y.o. MRN: [id number]  CC: This lady comes in for follow-up of hypertension, bilateral carotid bruits, hyperlipidemia.  She also has gastroesophageal reflux disease. HPI She says that her home blood pressure readings tend to be higher and she has noticed a blood pressure of systolic [id number].  This is frightening to her. Thankfully, she does not have any current symptoms of coronary artery disease or cerebrovascular disease. She continues on statin therapy for hyperlipidemia without any problems. She also continues on Protonix for gastroesophageal reflux disease but she feels that it is too expensive and does not seem to help her.  She was seen by gastroenterology recently and she underwent swallowing study which was unremarkable. She is due to have bilateral carotid Dopplers for her bruits done soon.  Past Medical History:  Diagnosis Date  . Back pain    DDD  . Bruises easily    pt attributes this to Plavix and ASA  . Carotid artery occlusion   . Coronary artery disease    no definite diagnosis found as of 04/17/12; no ischemia, EF 48% by Myoview stress 12/29/11 (Dr. Gwenlyn Found)  . Cough    phlem but no odor noted  . Dry skin   . History of bruising easily   . History of colon polyps   . History of migraine    last one at least 6months ago  . HTN (hypertension), benign    takes Losartan daily  . Hyperlipidemia    takes Atorvastatin daily  . PAD (peripheral artery disease) ([place])   . Panic attack    on the May 03, 2022 Granddaughter died(62months)  . Pneumonia    several yrs ago  . Rheumatoid  arthritis(714.0)   . Tobacco abuse       Family History  Problem Relation Age of Onset  . Hyperlipidemia Mother   . Hypertension Mother   . Cancer Father        Brain  . Diabetes Sister   . Hypertension Sister   . Diabetes Brother   . Heart disease Brother   . Hypertension Brother   . Heart attack Brother   . Colon cancer Neg Hx     Social History   Social History Narrative   Divorced.Lives alone.Works at [employer].   Social History   Tobacco Use  . Smoking status: Current Every Day Smoker    Packs/day: 0.50    Years: 50.00    Pack years: 25.00    Types: Cigarettes  . Smokeless tobacco: Never Used  . Tobacco comment: pt states that she uses nic patches  Substance Use Topics  . Alcohol use: Not Currently    Comment: occassionally    Current Meds  Medication Sig  . aspirin EC 81 MG tablet Take 81 mg by mouth daily.  . cilostazol (PLETAL) 100 MG tablet TAKE 1 TABLET(100 MG) BY MOUTH TWICE DAILY  . hydrochlorothiazide (HYDRODIURIL) 12.5 MG tablet Take 12.5 mg by mouth daily.  Marland Kitchen ibuprofen (ADVIL) 800 MG tablet Take 1 tablet (800 mg total) by mouth every 8 (eight) hours as needed.  Marland Kitchen  losartan (COZAAR) 25 MG tablet Take 25 mg by mouth daily.  . pantoprazole (PROTONIX) 40 MG tablet Take 1 tablet (40 mg total) by mouth daily.  . rosuvastatin (CRESTOR) 10 MG tablet Take 10 mg by mouth daily.      Objective:   Today's Vitals: BP 128/71 (BP Location: Left Arm, Patient Position: Sitting, Cuff Size: Normal)   Pulse 66   Temp (!) 96.6 F (35.9 C) (Temporal)   Resp 18   Ht 5\' 7"  (1.702 m)   Wt 172 lb (78 kg)   SpO2 98%   BMI 26.94 kg/m  Vitals with BMI 07/30/2019 07/03/2019 06/18/2019  Height 5\' 7"  5\' 7"  5\' 7"   Weight 172 lbs 171 lbs 10 oz 173 lbs 10 oz  BMI 26.93 [id number]  Systolic 0000000 [id number]  Diastolic 71 85 85  Pulse 66 98 65     Physical Exam  Her blood pressure reading in the office today was normal.  She is alert and orientated without any focal  neurological signs.     Assessment   1. Essential hypertension   2. Bilateral carotid bruits   3. Vitamin D deficiency disease   4. Mixed hyperlipidemia       Tests ordered No orders of the defined types were placed in this encounter.    Plan: 1. After joint decision making, we agreed that we should be proactive and I have increased her losartan to 50 mg daily and sent a new prescription for this.  She will continue to monitor blood pressure at home. 2. I will await bilateral carotid studies. 3. She will continue with vitamin D3 supplementation for vitamin D deficiency. 4. She will continue with statin therapy for hyperlipidemia in the face of coronary artery disease. 5. Follow-up in about a month to check on her blood pressure again and we will do blood work.   Meds ordered this encounter  Medications  . losartan (COZAAR) 50 MG tablet    Sig: Take 1 tablet (50 mg total) by mouth daily.    Dispense:  90 tablet    Refill:  0  . omeprazole (PRILOSEC) 40 MG capsule    Sig: Take 1 capsule (40 mg total) by mouth daily.    Dispense:  30 capsule    Refill:  3    Brodan Grewell Luther Parody, MD

## 2019-08-01 ENCOUNTER — Telehealth: Payer: Self-pay

## 2019-08-01 DIAGNOSIS — R059 Cough, unspecified: Secondary | ICD-10-CM

## 2019-08-01 DIAGNOSIS — R1312 Dysphagia, oropharyngeal phase: Secondary | ICD-10-CM

## 2019-08-01 NOTE — Telephone Encounter (Signed)
LSL, pt and her daughter called back to discuss the results. They would like a referral to see a surgeon for possible removal of Hiatal hernia. They are aware that pt's hernia is small. Please advise.

## 2019-08-05 ENCOUNTER — Other Ambulatory Visit: Payer: Self-pay

## 2019-08-05 ENCOUNTER — Ambulatory Visit: Payer: Medicare HMO | Admitting: Physician Assistant

## 2019-08-05 ENCOUNTER — Encounter: Payer: Self-pay | Admitting: Physician Assistant

## 2019-08-05 DIAGNOSIS — L810 Postinflammatory hyperpigmentation: Secondary | ICD-10-CM

## 2019-08-05 MED ORDER — HYDROQUINONE 4 % EX CREA: TOPICAL_CREAM | Freq: Two times a day (BID) | CUTANEOUS | 0 refills | Status: DC

## 2019-08-05 NOTE — Progress Notes (Signed)
   New Patient Visit  Subjective  Leslie [name] is a 74 y.o. AA female who presents for the following: Skin Problem (Left cheek - x 1 year-discoloration- "bothers me & taking away my beauty"). She noticed a redness on her left cheek about 1 year ago. It didn't hurt or burn. She didn't treat with anything. It then turned dark and the darkness has continued to get worse in the last few months. It still does not hurt or burn and there doesn't seem to be a texture change. She is not sure if her mask has contributed. In  The beginning of [place] she was working at [employer] and wearing a mask for 6-8 hours at a time.   Objective  Well appearing patient in no apparent distress; mood and affect are within normal limits.  Face examined. Relevant physical exam findings are noted in the Assessment and Plan.  Objective  Left Malar Cheek, Left Zygomatic Area (2), Right Malar Cheek, Right Zygomatic Area: Hyperpigmentation noticed temples and cheeks left greater than right. No inflammation is noted and no texture change or induration is noted.   Assessment & Plan  Postinflammatory hyperpigmentation (5) Left Zygomatic Area (2); Right Zygomatic Area; Left Malar Cheek; Right Malar Cheek  Start with OTC hydroquinone and if no improvement fill Rx for 4% hydroquinone. Recheck 2 -3 months. We also discussed the need for sun protection.  We discussed the possibility of other causes of hyperpigmentation like lupus or sarcoidosis. She will call if it reinflames, changes texture or becomes symptomatic.

## 2019-08-06 ENCOUNTER — Other Ambulatory Visit (INDEPENDENT_AMBULATORY_CARE_PROVIDER_SITE_OTHER): Payer: Self-pay

## 2019-08-06 ENCOUNTER — Other Ambulatory Visit: Payer: Self-pay | Admitting: *Deleted

## 2019-08-06 DIAGNOSIS — I6529 Occlusion and stenosis of unspecified carotid artery: Secondary | ICD-10-CM

## 2019-08-06 DIAGNOSIS — I739 Peripheral vascular disease, unspecified: Secondary | ICD-10-CM

## 2019-08-06 MED ORDER — IBUPROFEN 800 MG PO TABS: 800.0000 mg | ORAL_TABLET | Freq: Three times a day (TID) | ORAL | 0 refills | Status: DC | PRN

## 2019-08-06 MED ORDER — CILOSTAZOL 100 MG PO TABS: ORAL_TABLET | ORAL | 0 refills | Status: DC

## 2019-08-09 ENCOUNTER — Telehealth (HOSPITAL_COMMUNITY): Payer: Self-pay

## 2019-08-09 NOTE — Telephone Encounter (Signed)

## 2019-08-12 ENCOUNTER — Ambulatory Visit (INDEPENDENT_AMBULATORY_CARE_PROVIDER_SITE_OTHER)
Admission: RE | Admit: 2019-08-12 | Discharge: 2019-08-12 | Disposition: A | Discharge status: Home / Self Care | Payer: Medicare HMO | Source: Ambulatory Visit | Attending: Surgery | Admitting: Surgery

## 2019-08-12 ENCOUNTER — Other Ambulatory Visit: Payer: Self-pay

## 2019-08-12 ENCOUNTER — Ambulatory Visit (HOSPITAL_COMMUNITY)
Admission: RE | Admit: 2019-08-12 | Discharge: 2019-08-12 | Disposition: A | Discharge status: Home / Self Care | Payer: Medicare HMO | Source: Ambulatory Visit | Attending: Surgery | Admitting: Surgery

## 2019-08-12 ENCOUNTER — Encounter: Payer: Medicare HMO | Admitting: Surgery

## 2019-08-12 DIAGNOSIS — I6529 Occlusion and stenosis of unspecified carotid artery: Secondary | ICD-10-CM

## 2019-08-12 DIAGNOSIS — I739 Peripheral vascular disease, unspecified: Secondary | ICD-10-CM

## 2019-08-12 NOTE — Telephone Encounter (Signed)
LSL, following up on 08/01/19 note. Pt walked in office to see about referral for hernia removal. Pt's family is aware the the hernia isn't and wants it removed.

## 2019-08-13 NOTE — Telephone Encounter (Signed)
Spoke with pts daughter. She was notified that hernia was very small and no need for repair. Pts daughter was notified of LSL recommendations of adding moisture to foods by sipping water while eating, chewing sugarless gum instead of the peppermint candy to help with dry mouth. Pts daughter would like for her mother to have an evaluation with the ENT.

## 2019-08-13 NOTE — Telephone Encounter (Signed)
Her hiatal hernia is very small and does not need repair. Not likely source of her symptoms ie cough. See barium esophagram instructions from 07/22/19.  [facility] clinical: she does not need speech therapy referral. I want her to follow the instructions they previously provided her. SLP suggested that Pt keep a food journal to document what foods seem to cause her difficulty. SLP further recommended that pt add moisture to foods and/or sips of water and to try chewing sugarless gum instead of the peppermint candy to help with dry mouth.   I could offer her ENT evaluation of her coughing with solid foods I doubt EGD would be helpful for her symptoms.  Other option is to follow up with her PCP to discuss other possible causes of cough as we have exhausted GI work up of cough.

## 2019-08-13 NOTE — Telephone Encounter (Signed)
ENT referral for cough and throat symptoms with swallowing. Unremarkable esophagram and unremarkable speech therapy evaluation. Not responding to PPI therapy.

## 2019-08-14 NOTE — Addendum Note (Signed)
Addended by: Hassan Rowan on: 08/14/2019 07:57 AM   Modules accepted: Orders

## 2019-08-14 NOTE — Telephone Encounter (Signed)
Referral sent to ENT via Epic.

## 2019-08-20 ENCOUNTER — Other Ambulatory Visit (INDEPENDENT_AMBULATORY_CARE_PROVIDER_SITE_OTHER): Payer: Self-pay

## 2019-08-20 MED ORDER — PANTOPRAZOLE SODIUM 40 MG PO TBEC: 40.0000 mg | DELAYED_RELEASE_TABLET | Freq: Every day | ORAL | 3 refills | Status: DC

## 2019-08-20 MED ORDER — ROSUVASTATIN CALCIUM 10 MG PO TABS: 10.0000 mg | ORAL_TABLET | Freq: Every day | ORAL | 0 refills | Status: DC

## 2019-08-20 MED ORDER — LOSARTAN POTASSIUM 50 MG PO TABS: 50.0000 mg | ORAL_TABLET | Freq: Every day | ORAL | 0 refills | Status: DC

## 2019-09-02 ENCOUNTER — Other Ambulatory Visit (INDEPENDENT_AMBULATORY_CARE_PROVIDER_SITE_OTHER): Payer: Self-pay

## 2019-09-02 MED ORDER — LOSARTAN POTASSIUM 50 MG PO TABS: 50.0000 mg | ORAL_TABLET | Freq: Every day | ORAL | 0 refills | Status: DC

## 2019-09-02 MED ORDER — ROSUVASTATIN CALCIUM 10 MG PO TABS: 10.0000 mg | ORAL_TABLET | Freq: Every day | ORAL | 0 refills | Status: DC

## 2019-09-02 MED ORDER — PANTOPRAZOLE SODIUM 40 MG PO TBEC: 40.0000 mg | DELAYED_RELEASE_TABLET | Freq: Every day | ORAL | 3 refills | Status: DC

## 2019-09-03 ENCOUNTER — Ambulatory Visit (INDEPENDENT_AMBULATORY_CARE_PROVIDER_SITE_OTHER): Payer: Medicare HMO | Admitting: Internal Medicine

## 2019-09-03 ENCOUNTER — Encounter (INDEPENDENT_AMBULATORY_CARE_PROVIDER_SITE_OTHER): Payer: Self-pay | Admitting: Internal Medicine

## 2019-09-03 ENCOUNTER — Other Ambulatory Visit: Payer: Self-pay

## 2019-09-03 VITALS — BP 130/70 | HR 81 | Temp 97.5°F | Resp 18 | Ht 67.0 in | Wt 174.0 lb

## 2019-09-03 DIAGNOSIS — E782 Mixed hyperlipidemia: Secondary | ICD-10-CM

## 2019-09-03 DIAGNOSIS — Z1159 Encounter for screening for other viral diseases: Secondary | ICD-10-CM | POA: Diagnosis not present

## 2019-09-03 DIAGNOSIS — E559 Vitamin D deficiency, unspecified: Secondary | ICD-10-CM | POA: Diagnosis not present

## 2019-09-03 DIAGNOSIS — I10 Essential (primary) hypertension: Secondary | ICD-10-CM

## 2019-09-03 NOTE — Progress Notes (Signed)
Metrics: Intervention Frequency ACO  Documented Smoking Status Yearly  Screened one or more times in 24 months  Cessation Counseling or  Active cessation medication Past 24 months  Past 24 months   Guideline developer: UpToDate (See UpToDate for funding source) Date Released: 2014       Wellness Office Visit  Subjective:  Patient ID: Leslie [name], female    DOB: February 01, 1946  Age: 74 y.o. MRN: [id number]  CC: This lady comes in for follow-up of hypertension, hyperlipidemia and vitamin D deficiency. HPI  When I had seen her about a month ago, she had home readings which were elevated for her blood pressure.  We agreed to add losartan to her medication.  She has tolerated this well. She has been taking vitamin D3 10,000 units daily. She continues to take statin therapy for her hyperlipidemia in the face of carotid artery disease. Past Medical History:  Diagnosis Date  . Back pain    DDD  . Bruises easily    pt attributes this to Plavix and ASA  . Carotid artery occlusion   . Coronary artery disease    no definite diagnosis found as of 04/17/12; no ischemia, EF 48% by Myoview stress 12/29/11 (Dr. Gwenlyn Found)  . Cough    phlem but no odor noted  . Dry skin   . History of bruising easily   . History of colon polyps   . History of migraine    last one at least 74months ago  . HTN (hypertension), benign    takes Losartan daily  . Hyperlipidemia    takes Atorvastatin daily  . PAD (peripheral artery disease) ([place])   . Panic attack    on the 05/03/2022 Granddaughter died(78months)  . Pneumonia    several yrs ago  . Rheumatoid arthritis(714.0)   . Tobacco abuse    Past Surgical History:  Procedure Laterality Date  . ABDOMINAL HYSTERECTOMY    . ANGIOPLASTY  04/20/2012   Procedure: ANGIOPLASTY;  Surgeon: Serafina Mitchell, MD;  Location: [facility] OR;  Service: Vascular;  Laterality: Right;  Using 1cm x6 cm vascu-guard patch   . BACK SURGERY    . CARDIAC CATHETERIZATION  01/31/2005  . CAROTID  ANGIOGRAM N/A 01/06/2012   Procedure: CAROTID ANGIOGRAM;  Surgeon: Lorretta Harp, MD;  Location: [facility] CATH LAB;  Service: Cardiovascular;  Laterality: N/A;  . CARPAL TUNNEL RELEASE    . CHOLECYSTECTOMY    . COLONOSCOPY    . CORONARY ANGIOPLASTY  01/06/12/2005/2006    2 stents  . EGD/TCS  03/2004   Dr. Gala Romney: Tiny distal esophageal erosions, prepyloric antral erosions/ulcerations, internal hemorrhoids, diminutive polyp in the rectosigmoid junction removed (hyperplastic).  Pathology not available but reportedly she had H. pylori which was treated.  Marland Kitchen ENDARTERECTOMY  04/20/2012   Procedure: ENDARTERECTOMY CAROTID;  Surgeon: Serafina Mitchell, MD;  Location: [place];  Service: Vascular;  Laterality: Right;  . ESOPHAGOGASTRODUODENOSCOPY  07/2004   Dr. Gala Romney: Previous antral ulcer was completely healed.  EGD normal.  . GIVENS CAPSULE STUDY     ???  . leg stents    . LOWER EXTREMITY ANGIOGRAM N/A 01/06/2012   Procedure: LOWER EXTREMITY ANGIOGRAM;  Surgeon: Lorretta Harp, MD;  Location: [facility] CATH LAB;  Service: Cardiovascular;  Laterality: N/A;  . TOTAL KNEE ARTHROPLASTY    . TRIGGER FINGER RELEASE    . ulnar nerve surgery       Family History  Problem Relation Age of Onset  . Hyperlipidemia Mother   .  Hypertension Mother   . Cancer Father        Brain  . Diabetes Sister   . Hypertension Sister   . Diabetes Brother   . Heart disease Brother   . Hypertension Brother   . Heart attack Brother   . Colon cancer Neg Hx     Social History   Social History Narrative   Divorced.Lives alone.Works at [employer].   Social History   Tobacco Use  . Smoking status: Current Every Day Smoker    Packs/day: 0.50    Years: 50.00    Pack years: 25.00    Types: Cigarettes  . Smokeless tobacco: Never Used  . Tobacco comment: pt states that she uses nic patches  Substance Use Topics  . Alcohol use: Not Currently    Comment: occassionally    Current Meds  Medication Sig  . aspirin EC 81 MG  tablet Take 81 mg by mouth daily.  . Cholecalciferol (VITAMIN D-3) 125 MCG (5000 UT) TABS Take 2 tablets by mouth daily.  . cilostazol (PLETAL) 100 MG tablet TAKE 1 TABLET(100 MG) BY MOUTH TWICE DAILY  . hydrochlorothiazide (HYDRODIURIL) 12.5 MG tablet Take 12.5 mg by mouth daily.  . hydroquinone 4 % cream Apply topically 2 (two) times daily.  Marland Kitchen ibuprofen (ADVIL) 800 MG tablet Take 1 tablet (800 mg total) by mouth every 8 (eight) hours as needed.  Marland Kitchen losartan (COZAAR) 50 MG tablet Take 1 tablet (50 mg total) by mouth daily.  . pantoprazole (PROTONIX) 40 MG tablet Take 1 tablet (40 mg total) by mouth daily.  . rosuvastatin (CRESTOR) 10 MG tablet Take 1 tablet (10 mg total) by mouth daily.        Depression screen [facility] 2/9 06/18/2019 02/04/2019  Decreased Interest 0 0  Down, Depressed, Hopeless 0 0  PHQ - 2 Score 0 0     Objective:   Today's Vitals: BP 130/70 (BP Location: Right Arm, Patient Position: Sitting, Cuff Size: Normal)   Pulse 81   Temp (!) 97.5 F (36.4 C) (Temporal)   Resp 18   Ht 5\' 7"  (1.702 m)   Wt 174 lb (78.9 kg)   SpO2 91%   BMI 27.25 kg/m  Vitals with BMI 09/03/2019 07/30/2019 07/03/2019  Height 5\' 7"  5\' 7"  5\' 7"   Weight 174 lbs 172 lbs 171 lbs 10 oz  BMI 27.25 98.33 82.50  Systolic 539 767 341  Diastolic 70 71 85  Pulse 81 66 98     Physical Exam  She looks systemically well.  Her weight is stable.  Blood pressure is in a normal range.  She is alert and orientated without any focal neurological signs.     Assessment   1. Essential hypertension   2. Mixed hyperlipidemia   3. Vitamin D deficiency disease   4. Encounter for hepatitis C screening test for low risk patient       Tests ordered Orders Placed This Encounter  Procedures  . COMPLETE METABOLIC PANEL WITH GFR  . VITAMIN D 25 Hydroxy (Vit-D Deficiency, Fractures)  . Lipid panel  . Hepatitis C antibody     Plan: 1. Blood work is ordered. 2. She will continue with losartan  hydrochlorothiazide which seems to be controlling her blood pressure well at the office and at home. 3. She will continue with Crestor 10 mg daily and we will check lipid panel today. 4. She will continue with vitamin D3 10,000 units daily and I will check levels today. 5. Further  recommendations will depend on blood results and I will see her in about 3 months time for follow-up.   No orders of the defined types were placed in this encounter.   Doree Albee, MD

## 2019-09-04 LAB — COMPLETE METABOLIC PANEL WITH GFR
AG Ratio: 1.3 (calc) (ref 1.0–2.5)
ALT: 16 U/L (ref 6–29)
AST: 21 U/L (ref 10–35)
Albumin: 4 g/dL (ref 3.6–5.1)
Alkaline phosphatase (APISO): 57 U/L (ref 37–153)
BUN: 12 mg/dL (ref 7–25)
CO2: 29 mmol/L (ref 20–32)
Calcium: 9.6 mg/dL (ref 8.6–10.4)
Chloride: 91 mmol/L — ABNORMAL LOW (ref 98–110)
Creat: 0.88 mg/dL (ref 0.60–0.93)
GFR, Est African American: 76 mL/min/{1.73_m2} (ref >=60)
GFR, Est Non African American: 66 mL/min/{1.73_m2} (ref >=60)
Globulin: 3.2 g/dL (calc) (ref 1.9–3.7)
Glucose, Bld: 120 mg/dL — ABNORMAL HIGH (ref 65–99)
Potassium: 4.9 mmol/L (ref 3.5–5.3)
Sodium: 128 mmol/L — ABNORMAL LOW (ref 135–146)
Total Bilirubin: 0.6 mg/dL (ref 0.2–1.2)
Total Protein: 7.2 g/dL (ref 6.1–8.1)

## 2019-09-04 LAB — LIPID PANEL
Cholesterol: 164 mg/dL (ref <200)
HDL: 50 mg/dL (ref >=50)
LDL Cholesterol (Calc): 93 mg/dL (calc)
Non-HDL Cholesterol (Calc): 114 mg/dL (calc) (ref <130)
Total CHOL/HDL Ratio: 3.3 (calc) (ref <5.0)
Triglycerides: 109 mg/dL (ref <150)

## 2019-09-04 LAB — VITAMIN D 25 HYDROXY (VIT D DEFICIENCY, FRACTURES): Vit D, 25-Hydroxy: 42 ng/mL (ref 30–100)

## 2019-09-04 LAB — HEPATITIS C ANTIBODY
Hepatitis C Ab: NONREACTIVE
SIGNAL TO CUT-OFF: 0.01 (ref <1.00)

## 2019-09-04 NOTE — Progress Notes (Signed)
Please let the patient know that her sodium levels are low.  I want her to stop the hydrochlorothiazide that she takes for her blood pressure.  Continue taking the losartan for blood pressure.  Although I am supposed to see her in 3 months for follow-up, I think we need to see her earlier in about a month's time so that we can make sure her blood pressure is still well controlled and check her sodium levels again.  Please make that appointment.  Thanks.

## 2019-09-04 NOTE — Progress Notes (Signed)
Patient called.  Notified to STOP HCTZ  medication today ongoing. Reasons was because of sodium levels too low. Continue to take Losartan daily for blood pressure. F/u to re-check levels on 6/20@8 :30am.

## 2019-09-17 ENCOUNTER — Other Ambulatory Visit (INDEPENDENT_AMBULATORY_CARE_PROVIDER_SITE_OTHER): Payer: Self-pay | Admitting: Internal Medicine

## 2019-09-18 ENCOUNTER — Other Ambulatory Visit: Payer: Self-pay

## 2019-09-18 ENCOUNTER — Telehealth (INDEPENDENT_AMBULATORY_CARE_PROVIDER_SITE_OTHER): Payer: Self-pay

## 2019-09-18 ENCOUNTER — Emergency Department (HOSPITAL_COMMUNITY)
Admission: EM | Admit: 2019-09-18 | Discharge: 2019-09-19 | Disposition: A | Discharge status: Home / Self Care | Payer: Medicare HMO | Attending: Emergency Medicine | Admitting: Emergency Medicine

## 2019-09-18 ENCOUNTER — Encounter (HOSPITAL_COMMUNITY): Payer: Self-pay

## 2019-09-18 DIAGNOSIS — Z720 Tobacco use: Secondary | ICD-10-CM

## 2019-09-18 DIAGNOSIS — I509 Heart failure, unspecified: Secondary | ICD-10-CM | POA: Insufficient documentation

## 2019-09-18 DIAGNOSIS — Z91040 Latex allergy status: Secondary | ICD-10-CM | POA: Insufficient documentation

## 2019-09-18 DIAGNOSIS — Z79899 Other long term (current) drug therapy: Secondary | ICD-10-CM | POA: Insufficient documentation

## 2019-09-18 DIAGNOSIS — I10 Essential (primary) hypertension: Secondary | ICD-10-CM

## 2019-09-18 DIAGNOSIS — I110 Hypertensive heart disease with heart failure: Secondary | ICD-10-CM | POA: Insufficient documentation

## 2019-09-18 DIAGNOSIS — F17210 Nicotine dependence, cigarettes, uncomplicated: Secondary | ICD-10-CM | POA: Diagnosis not present

## 2019-09-18 DIAGNOSIS — Z8679 Personal history of other diseases of the circulatory system: Secondary | ICD-10-CM | POA: Insufficient documentation

## 2019-09-18 DIAGNOSIS — Z7982 Long term (current) use of aspirin: Secondary | ICD-10-CM | POA: Diagnosis not present

## 2019-09-18 DIAGNOSIS — R609 Edema, unspecified: Secondary | ICD-10-CM | POA: Insufficient documentation

## 2019-09-18 DIAGNOSIS — R600 Localized edema: Secondary | ICD-10-CM | POA: Diagnosis not present

## 2019-09-18 LAB — CBC WITH DIFFERENTIAL/PLATELET
Abs Immature Granulocytes: 0.02 10*3/uL (ref 0.00–0.07)
Basophils Absolute: 0 10*3/uL (ref 0.0–0.1)
Basophils Relative: 1 %
Eosinophils Absolute: 0.1 10*3/uL (ref 0.0–0.5)
Eosinophils Relative: 2 %
HCT: 42.2 % (ref 36.0–46.0)
Hemoglobin: 13.1 g/dL (ref 12.0–15.0)
Immature Granulocytes: 0 %
Lymphocytes Relative: 39 %
Lymphs Abs: 2.8 10*3/uL (ref 0.7–4.0)
MCH: 27.6 pg (ref 26.0–34.0)
MCHC: 31 g/dL (ref 30.0–36.0)
MCV: 88.8 fL (ref 80.0–100.0)
Monocytes Absolute: 0.6 10*3/uL (ref 0.1–1.0)
Monocytes Relative: 9 %
Neutro Abs: 3.6 10*3/uL (ref 1.7–7.7)
Neutrophils Relative %: 49 %
Platelets: 239 10*3/uL (ref 150–400)
RBC: 4.75 MIL/uL (ref 3.87–5.11)
RDW: 14.3 % (ref 11.5–15.5)
WBC: 7.3 10*3/uL (ref 4.0–10.5)
nRBC: 0 % (ref 0.0–0.2)

## 2019-09-18 LAB — URINALYSIS, ROUTINE W REFLEX MICROSCOPIC
Bilirubin Urine: NEGATIVE
Glucose, UA: NEGATIVE mg/dL
Ketones, ur: NEGATIVE mg/dL
Leukocytes,Ua: NEGATIVE
Nitrite: NEGATIVE
Protein, ur: NEGATIVE mg/dL
Specific Gravity, Urine: 1.004 — ABNORMAL LOW (ref 1.005–1.030)
pH: 7 (ref 5.0–8.0)

## 2019-09-18 LAB — BASIC METABOLIC PANEL
Anion gap: 9 (ref 5–15)
BUN: 12 mg/dL (ref 8–23)
CO2: 27 mmol/L (ref 22–32)
Calcium: 9.3 mg/dL (ref 8.9–10.3)
Chloride: 99 mmol/L (ref 98–111)
Creatinine, Ser: 0.92 mg/dL (ref 0.44–1.00)
GFR calc Af Amer: >60 mL/min (ref >60)
GFR calc non Af Amer: >60 mL/min (ref >60)
Glucose, Bld: 100 mg/dL — ABNORMAL HIGH (ref 70–99)
Potassium: 4.2 mmol/L (ref 3.5–5.1)
Sodium: 135 mmol/L (ref 135–145)

## 2019-09-18 MED ORDER — FUROSEMIDE 10 MG/ML IJ SOLN
20.0000 mg | Freq: Once | INTRAMUSCULAR | Status: AC
  Administered 2019-09-18: 20 mg via INTRAVENOUS
  Filled 2019-09-18: qty 2

## 2019-09-18 NOTE — ED Provider Notes (Signed)
[facility] EMERGENCY DEPARTMENT Provider Note   CSN: [id number] Arrival date & time: 09/18/19  1829     History Chief Complaint  Patient presents with  . Hypertension  . Edema    Leslie [name] is a 74 y.o. female.  Pt presents to the ED today with high blood pressure and leg swelling.  Pt was taken off her losartan HCTZ because her sodium dropped to 128 (6/15).  Pt said she has noticed swelling in her lower legs for the past few days.  She also noticed that her BP was elevated as well.  Pt denies any cp or sob.          Past Medical History:  Diagnosis Date  . Back pain    DDD  . Bruises easily    pt attributes this to Plavix and ASA  . Carotid artery occlusion   . Coronary artery disease    no definite diagnosis found as of 04/17/12; no ischemia, EF 48% by Myoview stress 12/29/11 (Dr. Gwenlyn Found)  . Cough    phlem but no odor noted  . Dry skin   . History of bruising easily   . History of colon polyps   . History of migraine    last one at least 30months ago  . HTN (hypertension), benign    takes Losartan daily  . Hyperlipidemia    takes Atorvastatin daily  . PAD (peripheral artery disease) ([place])   . Panic attack    on the 2022-04-30 Granddaughter died(38months)  . Pneumonia    several yrs ago  . Rheumatoid arthritis(714.0)   . Tobacco abuse     Patient Active Problem List   Diagnosis Date Noted  . Cough   . Oropharyngeal dysphagia   . Vitamin D deficiency disease 06/18/2019  . CHF (congestive heart failure) ([place]) 03/19/2015  . Hypertension 03/19/2015  . Peripheral vascular disease ([place]) 03/19/2015  . Congestive heart failure (CHF) ([place]) 03/19/2015  . Aftercare following surgery of the circulatory system, [place] 05/14/2012  . Carotid artery stenosis 03/12/2012  . Non compliance w medication regimen secondary to cost 10/04/2011  . Arthritis, rheumatoid ([place]) 10/04/2011  . Dizziness 10/03/2011  . PAD, high grade RICA by MRI 10/03/11 10/03/2011  . Tobacco abuse  10/03/2011  . LVH (left ventricular hypertrophy) 10/03/2011  . Renal artery stenosis: left 50% 2005 10/03/2011  . HLD (hyperlipidemia) 11/21/2007    Past Surgical History:  Procedure Laterality Date  . ABDOMINAL HYSTERECTOMY    . ANGIOPLASTY  04/20/2012   Procedure: ANGIOPLASTY;  Surgeon: Serafina Mitchell, MD;  Location: [facility] OR;  Service: Vascular;  Laterality: Right;  Using 1cm x6 cm vascu-guard patch   . BACK SURGERY    . CARDIAC CATHETERIZATION  01/31/2005  . CAROTID ANGIOGRAM N/A 01/06/2012   Procedure: CAROTID ANGIOGRAM;  Surgeon: Lorretta Harp, MD;  Location[facility] CATH LAB;  Service: Cardiovascular;  Laterality: N/A;  . CARPAL TUNNEL RELEASE    . CHOLECYSTECTOMY    . COLONOSCOPY    . CORONARY ANGIOPLASTY  01/06/12/2005/2006    2 stents  . EGD/TCS  03/2004   Dr. Gala Romney: Tiny distal esophageal erosions, prepyloric antral erosions/ulcerations, internal hemorrhoids, diminutive polyp in the rectosigmoid junction removed (hyperplastic).  Pathology not available but reportedly she had H. pylori which was treated.  Marland Kitchen ENDARTERECTOMY  04/20/2012   Procedure: ENDARTERECTOMY CAROTID;  Surgeon: Serafina Mitchell, MD;  Location: [facility] OR;  Service: Vascular;  Laterality: Right;  . ESOPHAGOGASTRODUODENOSCOPY  07/2004  Dr. Gala Romney: Previous antral ulcer was completely healed.  EGD normal.  . GIVENS CAPSULE STUDY     ???  . leg stents    . LOWER EXTREMITY ANGIOGRAM N/A 01/06/2012   Procedure: LOWER EXTREMITY ANGIOGRAM;  Surgeon: Lorretta Harp, MD;  Location: [facility] CATH LAB;  Service: Cardiovascular;  Laterality: N/A;  . TOTAL KNEE ARTHROPLASTY    . TRIGGER FINGER RELEASE    . ulnar nerve surgery       OB History    Gravida      Para      Term      Preterm      AB      Living  2     SAB      TAB      Ectopic      Multiple      Live Births              Family History  Problem Relation Age of Onset  . Hyperlipidemia Mother   . Hypertension Mother   . Cancer Father         Brain  . Diabetes Sister   . Hypertension Sister   . Diabetes Brother   . Heart disease Brother   . Hypertension Brother   . Heart attack Brother   . Colon cancer Neg Hx     Social History   Tobacco Use  . Smoking status: Current Every Day Smoker    Packs/day: 0.50    Years: 50.00    Pack years: 25.00    Types: Cigarettes  . Smokeless tobacco: Never Used  . Tobacco comment: pt states that she uses nic patches  Substance Use Topics  . Alcohol use: Not Currently    Comment: occassionally  . Drug use: No    Home Medications Prior to Admission medications   Medication Sig Start Date End Date Taking? Authorizing Provider  albuterol (VENTOLIN HFA) 108 (90 Base) MCG/ACT inhaler INHALE 2 INHALATIONS INTO THE LUNGS EVERY 6 HOURS AS NEEDED FOR WHEEZING 09/17/19   Hurshel Party C, MD  aspirin EC 81 MG tablet Take 81 mg by mouth daily.    [provider]  Cholecalciferol (VITAMIN D-3) 125 MCG (5000 UT) TABS Take 2 tablets by mouth daily.    [provider]  cilostazol (PLETAL) 100 MG tablet TAKE 1 TABLET(100 MG) BY MOUTH TWICE DAILY 08/06/19   Anastasio Champion, Nimish C, MD  hydrochlorothiazide (HYDRODIURIL) 12.5 MG tablet Take 12.5 mg by mouth daily.    [provider]  hydroquinone 4 % cream Apply topically 2 (two) times daily. 08/05/19   Clark-Bruning, Anderson Malta, PA-C  ibuprofen (ADVIL) 800 MG tablet Take 1 tablet (800 mg total) by mouth every 8 (eight) hours as needed. 08/06/19   Doree Albee, MD  losartan (COZAAR) 50 MG tablet Take 1 tablet (50 mg total) by mouth daily. 09/02/19   Doree Albee, MD  pantoprazole (PROTONIX) 40 MG tablet Take 1 tablet (40 mg total) by mouth daily. 09/02/19   Doree Albee, MD  rosuvastatin (CRESTOR) 10 MG tablet Take 1 tablet (10 mg total) by mouth daily. 09/02/19   Doree Albee, MD  cilostazol (PLETAL) 100 MG tablet Take 1 tablet (100 mg total) by mouth 2 (two) times daily. 09/09/13   Serafina Mitchell, MD    Allergies      Latex, Tape, and Atorvastatin  Review of Systems   Review of Systems  Cardiovascular: Positive for leg swelling.  All  other systems reviewed and are negative.   Physical Exam Updated Vital Signs BP (!) 171/102   Pulse 86   Temp 98.1 F (36.7 C)   Resp 19   Ht 5\' 7"  (1.702 m)   Wt 78.9 kg   SpO2 95%   BMI 27.25 kg/m   Physical Exam Vitals and nursing note reviewed.  Constitutional:      Appearance: Normal appearance.  HENT:     Head: Normocephalic and atraumatic.     Right Ear: External ear normal.     Left Ear: External ear normal.     Nose: Nose normal.     Mouth/Throat:     Mouth: Mucous membranes are moist.     Pharynx: Oropharynx is clear.  Eyes:     Extraocular Movements: Extraocular movements intact.     Conjunctiva/sclera: Conjunctivae normal.     Pupils: Pupils are equal, round, and reactive to light.  Cardiovascular:     Rate and Rhythm: Normal rate and regular rhythm.     Pulses: Normal pulses.     Heart sounds: Normal heart sounds.  Pulmonary:     Effort: Pulmonary effort is normal.     Breath sounds: Normal breath sounds.  Abdominal:     General: Abdomen is flat. Bowel sounds are normal.     Palpations: Abdomen is soft.  Musculoskeletal:     Cervical back: Normal range of motion and neck supple.     Right lower leg: Edema present.     Left lower leg: Edema present.  Skin:    General: Skin is warm.     Capillary Refill: Capillary refill takes less than 2 seconds.  Neurological:     General: No focal deficit present.     Mental Status: She is alert and oriented to person, place, and time.  Psychiatric:        Mood and Affect: Mood normal.        Behavior: Behavior normal.        Thought Content: Thought content normal.        Judgment: Judgment normal.     ED Results / Procedures / Treatments   Labs (all labs ordered are listed, but only abnormal results are displayed) Labs Reviewed  BASIC METABOLIC PANEL - Abnormal; Notable for the  following components:      Result Value   Glucose, Bld 100 (*)    All other components within normal limits  URINALYSIS, ROUTINE W REFLEX MICROSCOPIC - Abnormal; Notable for the following components:   Color, Urine STRAW (*)    APPearance HAZY (*)    Specific Gravity, Urine 1.004 (*)    Hgb urine dipstick SMALL (*)    Bacteria, UA RARE (*)    All other components within normal limits  CBC WITH DIFFERENTIAL/PLATELET    EKG None  Radiology No results found.  Procedures Procedures (including critical care time)  Medications Ordered in ED Medications  furosemide (LASIX) injection 20 mg (20 mg Intravenous Given 09/18/19 2214)    ED Course  I have reviewed the triage vital signs and the nursing notes.  Pertinent labs & imaging results that were available during my care of the patient were reviewed by me and considered in my medical decision making (see chart for details).    MDM Rules/Calculators/A&P                         BP is still elevated, but it is much better  than it was.  Her leg edema has gone down.  Pt encouraged to eat a low salt diet and to wear compression socks.  She has an appt with her pcp in the morning.  Pt's Na is back to nl.  Pt encouraged to stop smoking.   I am not going to add any meds as an outpatient since she has f/u in just a few hrs.  Pt knows to return if worse. Final Clinical Impression(s) / ED Diagnoses Final diagnoses:  Essential hypertension  Peripheral edema  Tobacco abuse    Rx / DC Orders ED Discharge Orders    None       Isla Pence, MD 09/18/19 2325

## 2019-09-18 NOTE — Telephone Encounter (Signed)
Please make her an appointment to be seen by Judson Roch tomorrow to evaluate this.

## 2019-09-18 NOTE — ED Triage Notes (Signed)
Pt comes in for HTN. States it was 198/110 at home this evening. Took her AM BP med. But does not have any at night. 201/105 in triage. Denies any pain .   Bilateral LE Edema starting Saturday. States that her md took her off her fluid pill. \supposed to have appointment for the same

## 2019-09-18 NOTE — Telephone Encounter (Signed)
Ms Tullo is scheduled with Judson Roch tomorrow morning

## 2019-09-18 NOTE — Telephone Encounter (Signed)
Leslie [name] is stating that both feet and legs are swelling for a week now they are feeling really tight, please advise?

## 2019-09-19 ENCOUNTER — Encounter (INDEPENDENT_AMBULATORY_CARE_PROVIDER_SITE_OTHER): Payer: Self-pay | Admitting: Nurse Practitioner

## 2019-09-19 ENCOUNTER — Other Ambulatory Visit (INDEPENDENT_AMBULATORY_CARE_PROVIDER_SITE_OTHER): Payer: Self-pay | Admitting: Nurse Practitioner

## 2019-09-19 ENCOUNTER — Ambulatory Visit (INDEPENDENT_AMBULATORY_CARE_PROVIDER_SITE_OTHER): Payer: Medicare HMO | Admitting: Nurse Practitioner

## 2019-09-19 VITALS — BP 120/85 | HR 105 | Temp 96.1°F | Ht 67.0 in | Wt 169.0 lb

## 2019-09-19 DIAGNOSIS — R609 Edema, unspecified: Secondary | ICD-10-CM

## 2019-09-19 DIAGNOSIS — I5030 Unspecified diastolic (congestive) heart failure: Secondary | ICD-10-CM

## 2019-09-19 MED ORDER — POTASSIUM CHLORIDE ER 10 MEQ PO TBCR: EXTENDED_RELEASE_TABLET | ORAL | 1 refills | Status: DC

## 2019-09-19 MED ORDER — FUROSEMIDE 20 MG PO TABS: 20.0000 mg | ORAL_TABLET | Freq: Every day | ORAL | 1 refills | Status: DC | PRN

## 2019-09-19 NOTE — Progress Notes (Addendum)
Subjective:  Patient ID: Leslie [name], female    DOB: 1945/12/23  Age: 74 y.o. MRN: [id number]  CC:  Chief Complaint  Patient presents with  . Hypertension    ER last night for elevated BP  . Leg Swelling      HPI  This patient comes in today for an acute visit for the above.  She is a 74 year old female with a past medical history significant for but not limited to chronic diastolic heart failure, hypertension, peripheral vascular disease, carotid artery stenosis, hyperlipidemia, and arthritis.  She tells me that over the last 5 days she has noticed increased swelling in her lower extremities.  Approximately 2 weeks ago she was told to stop her hydrochlorothiazide due to hyponatremia.  She tells me first her left was swelling and then her right started to swell.  She ended up going to the emergency department last night because she had elevated blood pressure and leg swelling.  In the emergency department they administered furosemide and repeated her metabolic panel.  The furosemide resolved the leg swelling, and the repeat blood work showed normalized sodium, potassium, and renal function.  I do see that she has had a cardiac echocardiogram back in December 2020 which did show left ventricular hypertrophy,  normal ejection fraction (65-70%), mildly abnormal diastolic relaxation, mitral valve regurgitation, and generally normal tricuspid/aortic/pulmonic valves.    Past Medical History:  Diagnosis Date  . Back pain    DDD  . Bruises easily    pt attributes this to Plavix and ASA  . Carotid artery occlusion   . CHF (congestive heart failure) ([facility])    Chronic Diastolic  . Coronary artery disease    no definite diagnosis found as of 04/17/12; no ischemia, EF 48% by Myoview stress 12/29/11 (Dr. Gwenlyn Found)  . Cough    phlem but no odor noted  . Dry skin   . History of bruising easily   . History of colon polyps   . History of migraine    last one at least 68months ago  . HTN  (hypertension), benign    takes Losartan daily  . Hyperlipidemia    takes Atorvastatin daily  . PAD (peripheral artery disease) ([place])   . Panic attack    on the May 08, 2022 Granddaughter died(41months)  . Pneumonia    several yrs ago  . Rheumatoid arthritis(714.0)   . Tobacco abuse       Family History  Problem Relation Age of Onset  . Hyperlipidemia Mother   . Hypertension Mother   . Cancer Father        Brain  . Diabetes Sister   . Hypertension Sister   . Diabetes Brother   . Heart disease Brother   . Hypertension Brother   . Heart attack Brother   . Colon cancer Neg Hx     Social History   Social History Narrative   Divorced.Lives alone.Works at [employer].   Social History   Tobacco Use  . Smoking status: Current Every Day Smoker    Packs/day: 0.50    Years: 50.00    Pack years: 25.00    Types: Cigarettes  . Smokeless tobacco: Never Used  . Tobacco comment: pt states that she uses nic patches  Substance Use Topics  . Alcohol use: Not Currently    Comment: occassionally     Current Meds  Medication Sig  . albuterol (VENTOLIN HFA) 108 (90 Base) MCG/ACT inhaler INHALE 2 INHALATIONS INTO  THE LUNGS EVERY 6 HOURS AS NEEDED FOR WHEEZING  . aspirin EC 81 MG tablet Take 81 mg by mouth daily.  . Cholecalciferol (VITAMIN D-3) 125 MCG (5000 UT) TABS Take 2 tablets by mouth daily.  . cilostazol (PLETAL) 100 MG tablet TAKE 1 TABLET(100 MG) BY MOUTH TWICE DAILY  . hydroquinone 4 % cream Apply topically 2 (two) times daily.  Marland Kitchen ibuprofen (ADVIL) 800 MG tablet Take 1 tablet (800 mg total) by mouth every 8 (eight) hours as needed.  Marland Kitchen losartan (COZAAR) 50 MG tablet Take 1 tablet (50 mg total) by mouth daily.  . pantoprazole (PROTONIX) 40 MG tablet Take 1 tablet (40 mg total) by mouth daily.  . rosuvastatin (CRESTOR) 10 MG tablet Take 1 tablet (10 mg total) by mouth daily.  . [DISCONTINUED] hydrochlorothiazide (HYDRODIURIL) 12.5 MG tablet Take 12.5 mg by mouth daily.     ROS:  Review of Systems  Respiratory: Positive for cough. Negative for shortness of breath.   Cardiovascular: Positive for leg swelling. Negative for chest pain.     Objective:   Today's Vitals: BP 120/85 (BP Location: Left Arm, Patient Position: Sitting, Cuff Size: Normal)   Pulse (!) 105   Temp (!) 96.1 F (35.6 C) (Temporal)   Ht 5\' 7"  (1.702 m)   Wt 169 lb (76.7 kg)   SpO2 92%   BMI 26.47 kg/m  Vitals with BMI 09/19/2019 09/18/2019 09/18/2019  Height 5\' 7"  - -  Weight 169 lbs - -  BMI 54.00 - -  Systolic 867 619 -  Diastolic 85 92 -  Pulse 509 - 86     Physical Exam Vitals reviewed.  Constitutional:      General: She is not in acute distress.    Appearance: Normal appearance.  HENT:     Head: Normocephalic and atraumatic.  Neck:     Vascular: No carotid bruit.  Cardiovascular:     Rate and Rhythm: Normal rate and regular rhythm.     Pulses: Normal pulses.     Heart sounds: Normal heart sounds.  Pulmonary:     Effort: Pulmonary effort is normal.     Breath sounds: Normal breath sounds.  Skin:    General: Skin is warm and dry.  Neurological:     General: No focal deficit present.     Mental Status: She is alert and oriented to person, place, and time.  Psychiatric:        Mood and Affect: Mood normal.        Behavior: Behavior normal.        Judgment: Judgment normal.          Assessment and Plan   1. Swelling   2. Diastolic congestive heart failure, unspecified HF chronicity ([place])      Plan: 1.,  2.  I believe her swelling is most likely related to her congestive heart failure at this time.  Swelling seems to be much improved since being treated in the emergency department and suspicion for infection, DVT is quite low at this time.  Will prescribe an as needed daily furosemide tablet that she can take for swelling as well as potassium supplementation when she takes her furosemide.  She was cautioned on side effects of this medication, and she  will return next week to have blood work rechecked to make sure metabolic panel remains normal.  She also follow-up in 1 month for close monitoring.  At that time Dr. Anastasio Champion may want to consider referring patient to  cardiology for further assistance with management of her heart failure.   Tests ordered No orders of the defined types were placed in this encounter.     Meds ordered this encounter  Medications  . furosemide (LASIX) 20 MG tablet    Sig: Take 1 tablet (20 mg total) by mouth daily as needed (swelling).    Dispense:  30 tablet    Refill:  1    Order Specific Question:   Supervising Provider    Answer:   Hurshel Party C [[id number]]  . potassium chloride (KLOR-CON) 10 MEQ tablet    Sig: Take 1 tablet by mouth when you take your furosemide (lasix) tablet    Dispense:  30 tablet    Refill:  1    Order Specific Question:   Supervising Provider    Answer:   Doree Albee [[id number]]    Patient to follow-up in 1 week for blood work and 1 month for office visit  Ailene Ards, NP

## 2019-09-24 ENCOUNTER — Ambulatory Visit (INDEPENDENT_AMBULATORY_CARE_PROVIDER_SITE_OTHER): Payer: Medicare HMO | Admitting: Nurse Practitioner

## 2019-09-24 ENCOUNTER — Telehealth (INDEPENDENT_AMBULATORY_CARE_PROVIDER_SITE_OTHER): Payer: Self-pay

## 2019-09-24 ENCOUNTER — Other Ambulatory Visit (INDEPENDENT_AMBULATORY_CARE_PROVIDER_SITE_OTHER): Payer: Medicare HMO

## 2019-09-24 ENCOUNTER — Other Ambulatory Visit: Payer: Self-pay

## 2019-09-24 VITALS — BP 140/79 | HR 112 | Temp 96.1°F | Ht 67.0 in | Wt 173.2 lb

## 2019-09-24 DIAGNOSIS — I10 Essential (primary) hypertension: Secondary | ICD-10-CM | POA: Diagnosis not present

## 2019-09-24 DIAGNOSIS — R609 Edema, unspecified: Secondary | ICD-10-CM

## 2019-09-24 DIAGNOSIS — I5030 Unspecified diastolic (congestive) heart failure: Secondary | ICD-10-CM | POA: Diagnosis not present

## 2019-09-24 LAB — COMPLETE METABOLIC PANEL WITH GFR
AG Ratio: 1.2 (calc) (ref 1.0–2.5)
ALT: 12 U/L (ref 6–29)
AST: 16 U/L (ref 10–35)
Albumin: 3.9 g/dL (ref 3.6–5.1)
Alkaline phosphatase (APISO): 54 U/L (ref 37–153)
BUN/Creatinine Ratio: 9 (calc) (ref 6–22)
BUN: 9 mg/dL (ref 7–25)
CO2: 31 mmol/L (ref 20–32)
Calcium: 9.7 mg/dL (ref 8.6–10.4)
Chloride: 98 mmol/L (ref 98–110)
Creat: 0.97 mg/dL — ABNORMAL HIGH (ref 0.60–0.93)
GFR, Est African American: 68 mL/min/{1.73_m2} (ref >=60)
GFR, Est Non African American: 58 mL/min/{1.73_m2} — ABNORMAL LOW (ref >=60)
Globulin: 3.3 g/dL (calc) (ref 1.9–3.7)
Glucose, Bld: 119 mg/dL — ABNORMAL HIGH (ref 65–99)
Potassium: 4 mmol/L (ref 3.5–5.3)
Sodium: 136 mmol/L (ref 135–146)
Total Bilirubin: 0.6 mg/dL (ref 0.2–1.2)
Total Protein: 7.2 g/dL (ref 6.1–8.1)

## 2019-09-24 MED ORDER — FUROSEMIDE 20 MG PO TABS: ORAL_TABLET | ORAL | 0 refills | Status: DC

## 2019-09-24 MED ORDER — POTASSIUM CHLORIDE ER 10 MEQ PO TBCR: EXTENDED_RELEASE_TABLET | ORAL | 0 refills | Status: DC

## 2019-09-24 MED ORDER — LOSARTAN POTASSIUM 25 MG PO TABS: 25.0000 mg | ORAL_TABLET | Freq: Two times a day (BID) | ORAL | 0 refills | Status: DC

## 2019-09-24 NOTE — Telephone Encounter (Signed)
Pt cam into office to get lab work completed. Pt is asking to have BP concerns, it has been high and she is concerned. PT has not taken any medications yet this morning. Rt :180/100 & Lft:160/80. Please advise what you would like to do at this time?

## 2019-09-24 NOTE — Patient Instructions (Signed)

## 2019-09-24 NOTE — Progress Notes (Signed)
Subjective:  Patient ID: Leslie [name], female    DOB: [birth year]  Age: 74 y.o. MRN: [id number]  CC:  Chief Complaint  Patient presents with  . Hypertension      HPI  This patient comes in today for an acute visit for the above.  She was here earlier this morning for lab work, but asked for the medical assistant to check her blood pressure.  When the blood pressure was checked she was very hypertensive with a systolic pressure around 756.  She is here now for an acute visit to address this.  She takes losartan 50 mg daily.  She was on hydrochlorothiazide in the past but this was discontinued when her sodium levels were found to be low.  After discontinuing that she started experiencing lower extremity swelling and was evaluated in the hospital emergency department.  At that time she was treated with furosemide, and the swelling resolved.  Repeat electrolytes after receiving furosemide in the emergency department remained normal.  Per chart review at her follow-up with me following the emergency department visit I did note that she has a history of diastolic heart failure.  She used to see a cardiologist, but is not seeing one on a regular basis anymore.  I did prescribe her furosemide to take as needed for leg swelling, however she has not been able to get this filled due to high cost her pharmacy.  She tells me she has taken a couple of doses of her hydrochlorothiazide due to leg swelling.   Past Medical History:  Diagnosis Date  . Back pain    DDD  . Bruises easily    pt attributes this to Plavix and ASA  . Carotid artery occlusion   . CHF (congestive heart failure) (HCC)    Chronic Diastolic  . Coronary artery disease    no definite diagnosis found as of 04/17/12; no ischemia, EF 48% by Myoview stress 12/29/11 (Dr. Gwenlyn Found)  . Cough    phlem but no odor noted  . Dry skin   . History of bruising easily   . History of colon polyps   . History of migraine    last one at least  31months ago  . HTN (hypertension), benign    takes Losartan daily  . Hyperlipidemia    takes Atorvastatin daily  . PAD (peripheral artery disease) ([place])   . Panic attack    on the 04-20-2022 Granddaughter died(63months)  . Pneumonia    several yrs ago  . Rheumatoid arthritis(714.0)   . Tobacco abuse       Family History  Problem Relation Age of Onset  . Hyperlipidemia Mother   . Hypertension Mother   . Cancer Father        Brain  . Diabetes Sister   . Hypertension Sister   . Diabetes Brother   . Heart disease Brother   . Hypertension Brother   . Heart attack Brother   . Colon cancer Neg Hx     Social History   Social History Narrative   Divorced.Lives alone.Works at [employer].   Social History   Tobacco Use  . Smoking status: Current Every Day Smoker    Packs/day: 0.50    Years: 50.00    Pack years: 25.00    Types: Cigarettes  . Smokeless tobacco: Never Used  . Tobacco comment: pt states that she uses nic patches  Substance Use Topics  . Alcohol use: Not Currently  Comment: occassionally     Current Meds  Medication Sig  . albuterol (VENTOLIN HFA) 108 (90 Base) MCG/ACT inhaler INHALE 2 INHALATIONS INTO THE LUNGS EVERY 6 HOURS AS NEEDED FOR WHEEZING  . aspirin EC 81 MG tablet Take 81 mg by mouth daily.  . Cholecalciferol (VITAMIN D-3) 125 MCG (5000 UT) TABS Take 2 tablets by mouth daily.  . cilostazol (PLETAL) 100 MG tablet TAKE 1 TABLET(100 MG) BY MOUTH TWICE DAILY  . furosemide (LASIX) 20 MG tablet TAKE 1 TABLET(20 MG) BY MOUTH DAILY AS NEEDED FOR SWELLING  . hydroquinone 4 % cream Apply topically 2 (two) times daily.  Marland Kitchen ibuprofen (ADVIL) 800 MG tablet Take 1 tablet (800 mg total) by mouth every 8 (eight) hours as needed.  . pantoprazole (PROTONIX) 40 MG tablet Take 1 tablet (40 mg total) by mouth daily.  . potassium chloride (KLOR-CON) 10 MEQ tablet TAKE 1 TABLET BY MOUTH EVERY DAY WHEN YOU TAKE FUROSEMIDE(LASIX)  . rosuvastatin (CRESTOR) 10 MG  tablet Take 1 tablet (10 mg total) by mouth daily.  . [DISCONTINUED] furosemide (LASIX) 20 MG tablet TAKE 1 TABLET(20 MG) BY MOUTH DAILY AS NEEDED FOR SWELLING  . [DISCONTINUED] losartan (COZAAR) 50 MG tablet Take 1 tablet (50 mg total) by mouth daily.  . [DISCONTINUED] potassium chloride (KLOR-CON) 10 MEQ tablet TAKE 1 TABLET BY MOUTH EVERY DAY WHEN YOU TAKE FUROSEMIDE(LASIX)    ROS:  Review of Systems  Eyes: Negative.   Respiratory: Negative.   Cardiovascular: Negative.   Neurological: Negative.      Objective:   Today's Vitals: BP 140/79 (BP Location: Right Arm, Cuff Size: Normal)   Pulse (!) 112   Temp (!) 96.1 F (35.6 C) (Temporal)   Ht 5\' 7"  (1.702 m)   Wt 173 lb 3.2 oz (78.6 kg)   SpO2 97%   BMI 27.13 kg/m  Vitals with BMI 09/24/2019 09/24/2019 09/19/2019  Height - 5\' 7"  5\' 7"   Weight - 173 lbs 3 oz 169 lbs  BMI - 95.62 13.08  Systolic 657 846 962  Diastolic 79 70 85  Pulse - 112 105     Physical Exam Vitals reviewed.  Constitutional:      General: She is not in acute distress.    Appearance: Normal appearance.  HENT:     Head: Normocephalic and atraumatic.  Neck:     Vascular: No carotid bruit.  Cardiovascular:     Rate and Rhythm: Normal rate and regular rhythm.     Pulses: Normal pulses.     Heart sounds: Normal heart sounds.  Pulmonary:     Effort: Pulmonary effort is normal.     Breath sounds: Normal breath sounds.  Musculoskeletal:     Right lower leg: Edema present.     Left lower leg: Edema present.  Skin:    General: Skin is warm and dry.  Neurological:     General: No focal deficit present.     Mental Status: She is alert and oriented to person, place, and time.  Psychiatric:        Mood and Affect: Mood normal.        Behavior: Behavior normal.        Judgment: Judgment normal.          Assessment and Plan   1. Essential hypertension   2. Swelling   3. Diastolic congestive heart failure, unspecified HF chronicity ([place])       Plan: 1.-3.  Blood pressure is much better this afternoon.  She is concerned because her blood pressure is quite labile at home.  I did discuss this with Dr. Anastasio Champion and we have decided that she will take losartan 25 mg twice a day as opposed to once a day to see if this helps to maintain normal blood pressure throughout the day.  I have also provided her with some good Rx coupons and recommended that she try to get the furosemide and potassium filled.  We also discussed her past medical history of heart failure, she tells me she does not recall being told that she has heart failure.  I did briefly discussed the pathophysiology of heart failure and that I recommended she follow-up with a cardiologist on a regular basis.  She is agreeable to this and I have sent referral for her.  We also discussed the importance of daily weights and that if she were to see a 3 pound weight gain overnight or 5 pounds in about 1 week she should take a tablet of her furosemide whether or not she sees leg swelling.  She is also reminded to take potassium every time she takes the dose of furosemide.  She tells me she understands.   Tests ordered Orders Placed This Encounter  Procedures  . Ambulatory referral to Cardiology      Meds ordered this encounter  Medications  . furosemide (LASIX) 20 MG tablet    Sig: TAKE 1 TABLET(20 MG) BY MOUTH DAILY AS NEEDED FOR SWELLING    Dispense:  90 tablet    Refill:  0    **Patient requests 90 days supply**    Order Specific Question:   Supervising Provider    Answer:   Hurshel Party C [[id number]]  . potassium chloride (KLOR-CON) 10 MEQ tablet    Sig: TAKE 1 TABLET BY MOUTH EVERY DAY WHEN YOU TAKE FUROSEMIDE(LASIX)    Dispense:  90 tablet    Refill:  0    **Patient requests 90 days supply**    Order Specific Question:   Supervising Provider    Answer:   Hurshel Party C [[id number]]  . losartan (COZAAR) 25 MG tablet    Sig: Take 1 tablet (25 mg total) by mouth in the  morning and at bedtime.    Dispense:  90 tablet    Refill:  0    Order Specific Question:   Supervising Provider    Answer:   Doree Albee [[id number]]    Patient to follow-up in 1 week for blood work check, and then again later on this month for office visit.  Ailene Ards, NP

## 2019-09-24 NOTE — Telephone Encounter (Signed)
Patient worked in for acute visit later today

## 2019-09-25 ENCOUNTER — Encounter (INDEPENDENT_AMBULATORY_CARE_PROVIDER_SITE_OTHER): Payer: Self-pay | Admitting: Otolaryngology

## 2019-09-25 ENCOUNTER — Ambulatory Visit (INDEPENDENT_AMBULATORY_CARE_PROVIDER_SITE_OTHER): Payer: Medicare HMO | Admitting: Otolaryngology

## 2019-09-25 VITALS — Temp 97.3°F

## 2019-09-25 DIAGNOSIS — K219 Gastro-esophageal reflux disease without esophagitis: Secondary | ICD-10-CM

## 2019-09-25 DIAGNOSIS — H6122 Impacted cerumen, left ear: Secondary | ICD-10-CM | POA: Diagnosis not present

## 2019-09-25 DIAGNOSIS — R1312 Dysphagia, oropharyngeal phase: Secondary | ICD-10-CM | POA: Diagnosis not present

## 2019-09-25 NOTE — Progress Notes (Addendum)
HPI: Leslie [name] is a 74 y.o. female who presents is referred by Dr. Anastasio Champion for evaluation of intermittent cough.  She states that whenever she eats something initially it makes her cough.  But then she is able to eat the rest of the meal without difficulty or coughing.  Symptoms have been going on for well over a year.  She feels like the food hits something in her throat that makes her cough.  She is presently on Protonix 40 mg in the morning.. She had modified barium swallow that showed minimal abnormalities.  Apparently she did not have any coughing while she had the modified barium swallow. She is also has some recent right ear discomfort and wanted the ears checked. Patient is a smoker and smokes three quarters of a pack a day  Past Medical History:  Diagnosis Date  . Back pain    DDD  . Bruises easily    pt attributes this to Plavix and ASA  . Carotid artery occlusion   . CHF (congestive heart failure) (HCC)    Chronic Diastolic  . Coronary artery disease    no definite diagnosis found as of 04/17/12; no ischemia, EF 48% by Myoview stress 12/29/11 (Dr. Gwenlyn Found)  . Cough    phlem but no odor noted  . Dry skin   . History of bruising easily   . History of colon polyps   . History of migraine    last one at least 28months ago  . HTN (hypertension), benign    takes Losartan daily  . Hyperlipidemia    takes Atorvastatin daily  . PAD (peripheral artery disease) ([place])   . Panic attack    on the 2022/04/28 Granddaughter died(37months)  . Pneumonia    several yrs ago  . Rheumatoid arthritis(714.0)   . Tobacco abuse    Past Surgical History:  Procedure Laterality Date  . ABDOMINAL HYSTERECTOMY    . ANGIOPLASTY  04/20/2012   Procedure: ANGIOPLASTY;  Surgeon: Serafina Mitchell, MD;  Location: [facility] OR;  Service: Vascular;  Laterality: Right;  Using 1cm x6 cm vascu-guard patch   . BACK SURGERY    . CARDIAC CATHETERIZATION  01/31/2005  . CAROTID ANGIOGRAM N/A 01/06/2012   Procedure:  CAROTID ANGIOGRAM;  Surgeon: Lorretta Harp, MD;  Location: [facility] CATH LAB;  Service: Cardiovascular;  Laterality: N/A;  . CARPAL TUNNEL RELEASE    . CHOLECYSTECTOMY    . COLONOSCOPY    . CORONARY ANGIOPLASTY  01/06/12/2005/2006    2 stents  . EGD/TCS  03/2004   Dr. Gala Romney: Tiny distal esophageal erosions, prepyloric antral erosions/ulcerations, internal hemorrhoids, diminutive polyp in the rectosigmoid junction removed (hyperplastic).  Pathology not available but reportedly she had H. pylori which was treated.  Marland Kitchen ENDARTERECTOMY  04/20/2012   Procedure: ENDARTERECTOMY CAROTID;  Surgeon: Serafina Mitchell, MD;  Location: [facility];  Service: Vascular;  Laterality: Right;  . ESOPHAGOGASTRODUODENOSCOPY  07/2004   Dr. Gala Romney: Previous antral ulcer was completely healed.  EGD normal.  . GIVENS CAPSULE STUDY     ???  . leg stents    . LOWER EXTREMITY ANGIOGRAM N/A 01/06/2012   Procedure: LOWER EXTREMITY ANGIOGRAM;  Surgeon: Lorretta Harp, MD;  Location: [facility] CATH LAB;  Service: Cardiovascular;  Laterality: N/A;  . TOTAL KNEE ARTHROPLASTY    . TRIGGER FINGER RELEASE    . ulnar nerve surgery     Social History   Socioeconomic History  . Marital status: Divorced    Spouse  name: Not on file  . Number of children: Not on file  . Years of education: Not on file  . Highest education level: Not on file  Occupational History  . Not on file  Tobacco Use  . Smoking status: Current Every Day Smoker    Packs/day: 0.50    Years: 50.00    Pack years: 25.00    Types: Cigarettes    Start date: 53  . Smokeless tobacco: Never Used  . Tobacco comment: pt states that she uses nic patches  Substance and Sexual Activity  . Alcohol use: Not Currently    Comment: occassionally  . Drug use: No  . Sexual activity: Not on file  Other Topics Concern  . Not on file  Social History Narrative   Divorced.Lives alone.Works at [employer].   Social Determinants of Health   Financial Resource Strain:   .  Difficulty of Paying Living Expenses:   Food Insecurity:   . Worried About Charity fundraiser in the Last Year:   . Arboriculturist in the Last Year:   Transportation Needs:   . Film/video editor (Medical):   Marland Kitchen Lack of Transportation (Non-Medical):   Physical Activity:   . Days of Exercise per Week:   . Minutes of Exercise per Session:   Stress:   . Feeling of Stress :   Social Connections:   . Frequency of Communication with Friends and Family:   . Frequency of Social Gatherings with Friends and Family:   . Attends Religious Services:   . Active Member of Clubs or Organizations:   . Attends Archivist Meetings:   Marland Kitchen Marital Status:    Family History  Problem Relation Age of Onset  . Hyperlipidemia Mother   . Hypertension Mother   . Cancer Father        Brain  . Diabetes Sister   . Hypertension Sister   . Diabetes Brother   . Heart disease Brother   . Hypertension Brother   . Heart attack Brother   . Colon cancer Neg Hx    Allergies  Allergen Reactions  . Latex Other (See Comments)    Burns skin   . Tape Other (See Comments) and Itching    Burns skin   . Atorvastatin Other (See Comments)    myalgia myalgia   Prior to Admission medications   Medication Sig Start Date End Date Taking? Authorizing Provider  albuterol (VENTOLIN HFA) 108 (90 Base) MCG/ACT inhaler INHALE 2 INHALATIONS INTO THE LUNGS EVERY 6 HOURS AS NEEDED FOR WHEEZING 09/17/19  Yes Gosrani, Nimish C, MD  aspirin EC 81 MG tablet Take 81 mg by mouth daily.   Yes [provider]  Cholecalciferol (VITAMIN D-3) 125 MCG (5000 UT) TABS Take 2 tablets by mouth daily.   Yes [provider]  cilostazol (PLETAL) 100 MG tablet TAKE 1 TABLET(100 MG) BY MOUTH TWICE DAILY 08/06/19  Yes Gosrani, Nimish C, MD  furosemide (LASIX) 20 MG tablet TAKE 1 TABLET(20 MG) BY MOUTH DAILY AS NEEDED FOR SWELLING 09/24/19  Yes Ailene Ards, NP  hydroquinone 4 % cream Apply topically 2 (two) times daily.  08/05/19  Yes Clark-Bruning, Anderson Malta, PA-C  ibuprofen (ADVIL) 800 MG tablet Take 1 tablet (800 mg total) by mouth every 8 (eight) hours as needed. 08/06/19  Yes Gosrani, Nimish C, MD  losartan (COZAAR) 25 MG tablet Take 1 tablet (25 mg total) by mouth in the morning and at bedtime. 09/24/19  Yes Ailene Ards, NP  pantoprazole (PROTONIX) 40 MG tablet Take 1 tablet (40 mg total) by mouth daily. 09/02/19  Yes Gosrani, Nimish C, MD  potassium chloride (KLOR-CON) 10 MEQ tablet TAKE 1 TABLET BY MOUTH EVERY DAY WHEN YOU TAKE FUROSEMIDE(LASIX) 09/24/19  Yes Ailene Ards, NP  rosuvastatin (CRESTOR) 10 MG tablet Take 1 tablet (10 mg total) by mouth daily. 09/02/19  Yes Gosrani, Nimish C, MD  cilostazol (PLETAL) 100 MG tablet Take 1 tablet (100 mg total) by mouth 2 (two) times daily. 09/09/13   Serafina Mitchell, MD     Positive ROS: Otherwise negative  All other systems have been reviewed and were otherwise negative with the exception of those mentioned in the HPI and as above.  Physical Exam: Constitutional: Alert, well-appearing, no acute distress. Ears: External ears without lesions or tenderness.  Left ear canal revealed a large amount of wax that was cleaned with forceps and curette.  The left TM was clear.  The right ear canal had minimal wax that was cleaned with a curette and the right TM is clear with good mobility pneumatic otoscopy. Nasal: External nose without lesions. Septum with minimal deformity and mild rhinitis.. Clear nasal passages with no signs of infection as both middle meatus regions are clear. Oral: Lips and gums without lesions. Tongue and palate mucosa without lesions. Posterior oropharynx clear. Fiberoptic laryngoscopy was performed through the left nostril.  Nasopharynx was clear.  The base of tongue vallecula epiglottis were normal.  Vocal cords were clear bilaterally with normal vocal mobility.  Piriform sinuses were clear bilaterally.  Minimal edema of the arytenoid mucosa but no  significant erythema.  The fiberoptic laryngoscope was passed through the upper esophageal sphincter without difficulty with clear upper cervical esophagus. Neck: No palpable adenopathy or masses Respiratory: Breathing comfortably  Skin: No facial/neck lesions or rash noted.  Laryngoscopy  Date/Time: 09/25/2019 12:09 PM Performed by: Rozetta Nunnery, MD Authorized by: Rozetta Nunnery, MD   Consent:    Consent obtained:  Verbal   Consent given by:  Patient Procedure details:    Indications: direct visualization of the upper aerodigestive tract     Medication:  Afrin   Instrument: flexible fiberoptic laryngoscope     Scope location: left nare   Sinus:    Left nasopharynx: normal   Mouth:    Oropharynx: normal     Vallecula: normal     Base of tongue: normal     Epiglottis: normal   Throat:    Pyriform sinus: normal     True vocal cords: normal   Comments:     On fiberoptic laryngoscopy the hypopharynx and larynx are clear to examination.  I was able to pass the fiberoptic laryngoscope through the upper esophageal sphincter without any difficulty and the upper cervical esophagus was clear. Cerumen impaction removal  Date/Time: 09/25/2019 12:12 PM Performed by: Rozetta Nunnery, MD Authorized by: Rozetta Nunnery, MD   Consent:    Consent obtained:  Verbal   Consent given by:  Patient   Risks discussed:  Pain and bleeding Procedure details:    Location:  L ear   Procedure type: curette and forceps   Post-procedure details:    Inspection:  TM intact and canal normal   Hearing quality:  Improved   Patient tolerance of procedure:  Tolerated well, no immediate complications Comments:     TMs are clear bilaterally.    Assessment: Pharyngeal dysphagia with clear upper airway examination otherwise.  History of GE reflux disease.  I suspect this is contributing some to her symptoms.  Plan: Reviewed with patient concerning normal upper airway examination  on fiberoptic laryngoscopy. Suggested changing her Protonix dosing from first thing in the morning to before dinner as this will provide better nighttime coverage of silent laryngeal pharyngeal reflux and perhaps will help some with symptoms.   Radene Journey, MD   CC:

## 2019-10-03 ENCOUNTER — Other Ambulatory Visit: Payer: Self-pay

## 2019-10-03 ENCOUNTER — Other Ambulatory Visit (INDEPENDENT_AMBULATORY_CARE_PROVIDER_SITE_OTHER): Payer: Medicare HMO

## 2019-10-03 ENCOUNTER — Other Ambulatory Visit (INDEPENDENT_AMBULATORY_CARE_PROVIDER_SITE_OTHER): Payer: Self-pay | Admitting: Internal Medicine

## 2019-10-03 DIAGNOSIS — I10 Essential (primary) hypertension: Secondary | ICD-10-CM

## 2019-10-04 LAB — COMPLETE METABOLIC PANEL WITH GFR
AG Ratio: 1.3 (calc) (ref 1.0–2.5)
ALT: 12 U/L (ref 6–29)
AST: 16 U/L (ref 10–35)
Albumin: 3.9 g/dL (ref 3.6–5.1)
Alkaline phosphatase (APISO): 53 U/L (ref 37–153)
BUN/Creatinine Ratio: 10 (calc) (ref 6–22)
BUN: 10 mg/dL (ref 7–25)
CO2: 30 mmol/L (ref 20–32)
Calcium: 9.4 mg/dL (ref 8.6–10.4)
Chloride: 94 mmol/L — ABNORMAL LOW (ref 98–110)
Creat: 0.96 mg/dL — ABNORMAL HIGH (ref 0.60–0.93)
GFR, Est African American: 68 mL/min/{1.73_m2} (ref >=60)
GFR, Est Non African American: 59 mL/min/{1.73_m2} — ABNORMAL LOW (ref >=60)
Globulin: 3.1 g/dL (calc) (ref 1.9–3.7)
Glucose, Bld: 96 mg/dL (ref 65–99)
Potassium: 4.5 mmol/L (ref 3.5–5.3)
Sodium: 133 mmol/L — ABNORMAL LOW (ref 135–146)
Total Bilirubin: 0.6 mg/dL (ref 0.2–1.2)
Total Protein: 7 g/dL (ref 6.1–8.1)

## 2019-10-07 NOTE — Progress Notes (Signed)
Please tell patient that the blood tests are fairly stable.  She needs to drink more water to remain hydrated.  Follow-up as scheduled.

## 2019-10-07 NOTE — Progress Notes (Signed)
Patient called.  No answer, left voice mail on phone contact.

## 2019-10-08 ENCOUNTER — Other Ambulatory Visit: Payer: Self-pay

## 2019-10-08 ENCOUNTER — Ambulatory Visit (INDEPENDENT_AMBULATORY_CARE_PROVIDER_SITE_OTHER): Payer: Medicare HMO | Admitting: Internal Medicine

## 2019-10-08 ENCOUNTER — Encounter (INDEPENDENT_AMBULATORY_CARE_PROVIDER_SITE_OTHER): Payer: Self-pay | Admitting: Internal Medicine

## 2019-10-08 VITALS — BP 145/100 | HR 78 | Temp 96.3°F | Ht 67.0 in | Wt 171.6 lb

## 2019-10-08 DIAGNOSIS — I10 Essential (primary) hypertension: Secondary | ICD-10-CM | POA: Diagnosis not present

## 2019-10-08 DIAGNOSIS — M545 Low back pain, unspecified: Secondary | ICD-10-CM

## 2019-10-08 MED ORDER — LOSARTAN POTASSIUM 50 MG PO TABS: 50.0000 mg | ORAL_TABLET | Freq: Two times a day (BID) | ORAL | 1 refills | Status: DC

## 2019-10-08 NOTE — Progress Notes (Signed)
Metrics: Intervention Frequency ACO  Documented Smoking Status Yearly  Screened one or more times in 24 months  Cessation Counseling or  Active cessation medication Past 24 months  Past 24 months   Guideline developer: UpToDate (See UpToDate for funding source) Date Released: 2014       Wellness Office Visit  Subjective:  Patient ID: Leslie [name], female    DOB: 1945-12-30  Age: 74 y.o. MRN: [id number]  CC: This lady comes in for follow-up of hypertension and she is also complaining of low back pain. HPI  She has been taking losartan 25 mg twice a day since the last visit and has tolerated it.  However, her blood pressure readings at home are still elevated. As far as the low back pain is concerned, this is a longstanding issue for which she had surgery over 20 years ago by Dr. Ellene Route, neurosurgeon.  It has not been a problem except for the last couple of weeks when she has had significant low back pain.  She would like to see Dr. Ellene Route again. Past Medical History:  Diagnosis Date  . Back pain    DDD  . Bruises easily    pt attributes this to Plavix and ASA  . Carotid artery occlusion   . CHF (congestive heart failure) (HCC)    Chronic Diastolic  . Coronary artery disease    no definite diagnosis found as of 04/17/12; no ischemia, EF 48% by Myoview stress 12/29/11 (Dr. Gwenlyn Found)  . Cough    phlem but no odor noted  . Dry skin   . History of bruising easily   . History of colon polyps   . History of migraine    last one at least 88months ago  . HTN (hypertension), benign    takes Losartan daily  . Hyperlipidemia    takes Atorvastatin daily  . PAD (peripheral artery disease) ([place])   . Panic attack    on the Apr 11, 2022 Granddaughter died(22months)  . Pneumonia    several yrs ago  . Rheumatoid arthritis(714.0)   . Tobacco abuse    Past Surgical History:  Procedure Laterality Date  . ABDOMINAL HYSTERECTOMY    . ANGIOPLASTY  04/20/2012   Procedure: ANGIOPLASTY;  Surgeon:  Serafina Mitchell, MD;  Location: [facility] OR;  Service: Vascular;  Laterality: Right;  Using 1cm x6 cm vascu-guard patch   . BACK SURGERY    . CARDIAC CATHETERIZATION  01/31/2005  . CAROTID ANGIOGRAM N/A 01/06/2012   Procedure: CAROTID ANGIOGRAM;  Surgeon: Lorretta Harp, MD;  Location: [facility] CATH LAB;  Service: Cardiovascular;  Laterality: N/A;  . CARPAL TUNNEL RELEASE    . CHOLECYSTECTOMY    . COLONOSCOPY    . CORONARY ANGIOPLASTY  01/06/12/2005/2006    2 stents  . EGD/TCS  03/2004   Dr. Gala Romney: Tiny distal esophageal erosions, prepyloric antral erosions/ulcerations, internal hemorrhoids, diminutive polyp in the rectosigmoid junction removed (hyperplastic).  Pathology not available but reportedly she had H. pylori which was treated.  Marland Kitchen ENDARTERECTOMY  04/20/2012   Procedure: ENDARTERECTOMY CAROTID;  Surgeon: Serafina Mitchell, MD;  Location: [place];  Service: Vascular;  Laterality: Right;  . ESOPHAGOGASTRODUODENOSCOPY  07/2004   Dr. Gala Romney: Previous antral ulcer was completely healed.  EGD normal.  . GIVENS CAPSULE STUDY     ???  . leg stents    . LOWER EXTREMITY ANGIOGRAM N/A 01/06/2012   Procedure: LOWER EXTREMITY ANGIOGRAM;  Surgeon: Lorretta Harp, MD;  Location: [facility] CATH LAB;  Service: Cardiovascular;  Laterality: N/A;  . TOTAL KNEE ARTHROPLASTY    . TRIGGER FINGER RELEASE    . ulnar nerve surgery       Family History  Problem Relation Age of Onset  . Hyperlipidemia Mother   . Hypertension Mother   . Cancer Father        Brain  . Diabetes Sister   . Hypertension Sister   . Diabetes Brother   . Heart disease Brother   . Hypertension Brother   . Heart attack Brother   . Colon cancer Neg Hx     Social History   Social History Narrative   Divorced.Lives alone.Works at [employer].   Social History   Tobacco Use  . Smoking status: Current Every Day Smoker    Packs/day: 0.50    Years: 50.00    Pack years: 25.00    Types: Cigarettes    Start date: 6  . Smokeless tobacco:  Never Used  . Tobacco comment: pt states that she uses nic patches  Substance Use Topics  . Alcohol use: Not Currently    Comment: occassionally    Current Meds  Medication Sig  . albuterol (VENTOLIN HFA) 108 (90 Base) MCG/ACT inhaler INHALE 2 INHALATIONS INTO THE LUNGS EVERY 6 HOURS AS NEEDED FOR WHEEZING  . aspirin EC 81 MG tablet Take 81 mg by mouth daily.  . Cholecalciferol (VITAMIN D-3) 125 MCG (5000 UT) TABS Take 2 tablets by mouth daily.  . cilostazol (PLETAL) 100 MG tablet TAKE 1 TABLET(100 MG) BY MOUTH TWICE DAILY  . furosemide (LASIX) 20 MG tablet TAKE 1 TABLET(20 MG) BY MOUTH DAILY AS NEEDED FOR SWELLING  . hydroquinone 4 % cream Apply topically 2 (two) times daily.  . pantoprazole (PROTONIX) 40 MG tablet Take 1 tablet (40 mg total) by mouth daily.  . potassium chloride (KLOR-CON) 10 MEQ tablet TAKE 1 TABLET BY MOUTH EVERY DAY WHEN YOU TAKE FUROSEMIDE(LASIX)  . rosuvastatin (CRESTOR) 10 MG tablet Take 1 tablet (10 mg total) by mouth daily.  . [DISCONTINUED] ibuprofen (ADVIL) 800 MG tablet Take 1 tablet (800 mg total) by mouth every 8 (eight) hours as needed.  . [DISCONTINUED] losartan (COZAAR) 25 MG tablet Take 1 tablet (25 mg total) by mouth in the morning and at bedtime.      Depression screen [facility] 2/9 06/18/2019 02/04/2019  Decreased Interest 0 0  Down, Depressed, Hopeless 0 0  PHQ - 2 Score 0 0     Objective:   Today's Vitals: BP (!) 145/100 (BP Location: Left Arm, Patient Position: Sitting, Cuff Size: Normal)   Pulse 78   Temp (!) 96.3 F (35.7 C) (Temporal)   Ht 5\' 7"  (1.702 m)   Wt 171 lb 9.6 oz (77.8 kg)   SpO2 98%   BMI 26.88 kg/m  Vitals with BMI 10/08/2019 09/24/2019 09/24/2019  Height 5\' 7"  - 5\' 7"   Weight 171 lbs 10 oz - 173 lbs 3 oz  BMI 49.44 - 96.75  Systolic 916 384 665  Diastolic 993 79 70  Pulse 78 - 112     Physical Exam   Her blood pressure is still not controlled even though she is not taking her blood pressure medicine this morning.   She is alert and orientated.    Assessment   1. Low back pain without sciatica, unspecified back pain laterality, unspecified chronicity   2. Essential hypertension       Tests ordered Orders Placed This Encounter  Procedures  . Ambulatory  referral to Neurosurgery     Plan: 1. For uncontrolled hypertension, I am going to increase her losartan to 50 mg twice a day and I have sent a new prescription to her mail order pharmacy.  In the meantime, she will double up on all the pills that she has. 2. I will refer her to Dr. Ellene Route, neurosurgeon again for review. 3. She will follow-up with Sarah in the next 2 to 3 weeks time at which point we will monitor her blood pressure and she will need blood work done to check her electrolytes and especially sodium.   Meds ordered this encounter  Medications  . losartan (COZAAR) 50 MG tablet    Sig: Take 1 tablet (50 mg total) by mouth 2 (two) times daily.    Dispense:  180 tablet    Refill:  1    Zakariah Urwin Luther Parody, MD

## 2019-10-21 ENCOUNTER — Other Ambulatory Visit: Payer: Self-pay

## 2019-10-21 ENCOUNTER — Encounter: Payer: Self-pay | Admitting: Surgery

## 2019-10-21 ENCOUNTER — Ambulatory Visit (INDEPENDENT_AMBULATORY_CARE_PROVIDER_SITE_OTHER): Payer: Medicare HMO | Admitting: Surgery

## 2019-10-21 VITALS — BP 171/105 | HR 84 | Temp 97.7°F | Resp 20 | Ht 67.0 in | Wt 172.0 lb

## 2019-10-21 DIAGNOSIS — I6523 Occlusion and stenosis of bilateral carotid arteries: Secondary | ICD-10-CM

## 2019-10-21 DIAGNOSIS — I70213 Atherosclerosis of native arteries of extremities with intermittent claudication, bilateral legs: Secondary | ICD-10-CM | POA: Diagnosis not present

## 2019-10-21 NOTE — Progress Notes (Signed)
[facility] [place]  Patient name: Leslie [name] MRN: [id number] DOB: 05-11-1945 Sex: female   REQUESTING PROVIDER:    Jeralyn Ruths   REASON FOR CONSULT:    Carotid stenosis  HISTORY OF PRESENT ILLNESS:   Leslie [name] is a 74 y.o. female, who I have not seen for 6+ years.  She had moved out of town but is now back.  She is status post right carotid endarterectomy with bovine patch angioplasty for asymptomatic stenosis on 04/20/2012.  Intraoperative findings included a diffuse long segment stenosis which went from her skull base down to her clavicle.  Currently she has no symptoms.  Specifically she denies numbness or weakness in either extremity.  She denies slurred speech.  She denies amaurosis fugax.  I had followed the patient in the past for claudication.  She has a known occlusion of the right superficial femoral artery stent.  Her symptoms have been tolerable.  She does not have any open wounds.  She takes a statin for hypercholesterolemia.  She is medically managed for hypertension.  She is a current smoker.  PAST MEDICAL HISTORY    Past Medical History:  Diagnosis Date   Back pain    DDD   Bruises easily    pt attributes this to Plavix and ASA   Carotid artery occlusion    CHF (congestive heart failure) (HCC)    Chronic Diastolic   Coronary artery disease    no definite diagnosis found as of 04/17/12; no ischemia, EF 48% by Myoview stress 12/29/11 (Dr. Gwenlyn Found)   Cough    phlem but no odor noted   Dry skin    History of bruising easily    History of colon polyps    History of migraine    last one at least 61months ago   HTN (hypertension), benign    takes Losartan daily   Hyperlipidemia    takes Atorvastatin daily   PAD (peripheral artery disease) ([place])    Panic attack    on the 2022-06-15 of Apr 21, 2022 Granddaughter died(64months)   Pneumonia    several yrs ago   Rheumatoid arthritis(714.0)    Tobacco  abuse      FAMILY HISTORY   Family History  Problem Relation Age of Onset   Hyperlipidemia Mother    Hypertension Mother    Cancer Father        Brain   Diabetes Sister    Hypertension Sister    Diabetes Brother    Heart disease Brother    Hypertension Brother    Heart attack Brother    Colon cancer Neg Hx     SOCIAL HISTORY:   Social History   Socioeconomic History   Marital status: Divorced    Spouse name: Not on file   Number of children: Not on file   Years of education: Not on file   Highest education level: Not on file  Occupational History   Not on file  Tobacco Use   Smoking status: Current Every Day Smoker    Packs/day: 0.50    Years: 50.00    Pack years: 25.00    Types: Cigarettes    Start date: 1961   Smokeless tobacco: Never Used   Tobacco comment: pt states that she uses nic patches  Vaping Use   Vaping Use: Never used  Substance and Sexual Activity   Alcohol use: Not Currently    Comment: occassionally   Drug use: No   Sexual activity:  Not on file  Other Topics Concern   Not on file  Social History Narrative   Divorced.Lives alone.Works at [employer].   Social Determinants of Health   Financial Resource Strain:    Difficulty of Paying Living Expenses:   Food Insecurity:    Worried About Charity fundraiser in the Last Year:    Arboriculturist in the Last Year:   Transportation Needs:    Film/video editor (Medical):    Lack of Transportation (Non-Medical):   Physical Activity:    Days of Exercise per Week:    Minutes of Exercise per Session:   Stress:    Feeling of Stress :   Social Connections:    Frequency of Communication with Friends and Family:    Frequency of Social Gatherings with Friends and Family:    Attends Religious Services:    Active Member of Clubs or Organizations:    Attends Archivist Meetings:    Marital Status:   Intimate Partner Violence:    Fear of  Current or Ex-Partner:    Emotionally Abused:    Physically Abused:    Sexually Abused:     ALLERGIES:    Allergies  Allergen Reactions   Latex Other (See Comments)    Burns skin    Tape Other (See Comments) and Itching    Burns skin    Atorvastatin Other (See Comments)    myalgia myalgia    CURRENT MEDICATIONS:    Current Outpatient Medications  Medication Sig Dispense Refill   albuterol (VENTOLIN HFA) 108 (90 Base) MCG/ACT inhaler INHALE 2 INHALATIONS INTO THE LUNGS EVERY 6 HOURS AS NEEDED FOR WHEEZING 54 g 6   aspirin EC 81 MG tablet Take 81 mg by mouth daily.     Cholecalciferol (VITAMIN D-3) 125 MCG (5000 UT) TABS Take 2 tablets by mouth daily.     cilostazol (PLETAL) 100 MG tablet TAKE 1 TABLET(100 MG) BY MOUTH TWICE DAILY 90 tablet 0   furosemide (LASIX) 20 MG tablet TAKE 1 TABLET(20 MG) BY MOUTH DAILY AS NEEDED FOR SWELLING 90 tablet 0   hydroquinone 4 % cream Apply topically 2 (two) times daily. 28.35 g 0   losartan (COZAAR) 50 MG tablet Take 1 tablet (50 mg total) by mouth 2 (two) times daily. 180 tablet 1   pantoprazole (PROTONIX) 40 MG tablet Take 1 tablet (40 mg total) by mouth daily. 30 tablet 3   potassium chloride (KLOR-CON) 10 MEQ tablet TAKE 1 TABLET BY MOUTH EVERY DAY WHEN YOU TAKE FUROSEMIDE(LASIX) 90 tablet 0   rosuvastatin (CRESTOR) 10 MG tablet Take 1 tablet (10 mg total) by mouth daily. 90 tablet 0   No current facility-administered medications for this visit.    REVIEW OF SYSTEMS:   [X]  denotes positive finding, [ ]  denotes negative finding Cardiac  Comments:  Chest pain or chest pressure:    Shortness of breath upon exertion:    Short of breath when lying flat:    Irregular heart rhythm:        Vascular    Pain in calf, thigh, or hip brought on by ambulation:    Pain in feet at night that wakes you up from your sleep:     Blood clot in your veins:    Leg swelling:         Pulmonary    Oxygen at home:    Productive  cough:     Wheezing:  Neurologic    Sudden weakness in arms or legs:     Sudden numbness in arms or legs:     Sudden onset of difficulty speaking or slurred speech:    Temporary loss of vision in one eye:     Problems with dizziness:         Gastrointestinal    Blood in stool:      Vomited blood:         Genitourinary    Burning when urinating:     Blood in urine:        Psychiatric    Major depression:         Hematologic    Bleeding problems:    Problems with blood clotting too easily:        Skin    Rashes or ulcers:        Constitutional    Fever or chills:     PHYSICAL EXAM:   Vitals:   10/21/19 1120 10/21/19 1122  BP: (!) 159/96 (!) 171/105  Pulse: 84   Resp: 20   Temp: 97.7 F (36.5 C)   SpO2: 95%   Weight: 172 lb (78 kg)   Height: 5\' 7"  (1.702 m)     GENERAL: The patient is a well-nourished female, in no acute distress. The vital signs are documented above. CARDIAC: There is a regular rate and rhythm.  VASCULAR: Nonpalpable pedal pulses PULMONARY: Nonlabored respirations ABDOMEN: Soft and non-tender with normal pitched bowel sounds.  MUSCULOSKELETAL: There are no major deformities or cyanosis. NEUROLOGIC: No focal weakness or paresthesias are detected. SKIN: There are no ulcers or rashes noted. PSYCHIATRIC: The patient has a normal affect.  STUDIES:   I have reviewed the following:  1. Moderate plaque at the level of the left carotid bulb and proximal left ICA with estimated 50-69% left ICA stenosis. 2. No evidence of recurrent right-sided carotid stenosis after prior endarterectomy.   ABI/TBI Today's ABI Today's TBI Previous ABI Previous TBI   +-------+-----------+-----------+------------+------------+   Right  0.72     0.69     0.69     0.61       +-------+-----------+-----------+------------+------------+   Left   0.90     0.53     0.77     0.62        +-------+-----------+-----------+------------+------------+   ASSESSMENT and PLAN   Carotid stenosis: The patient is asymptomatic.  She has no recurrent stenosis on the right and moderate stenosis on the left.  I plan on repeating her ultrasound in 1 year  Claudication: Her symptoms are stable now.  She knows to contact me if she develops a nonhealing wound.  Otherwise I will reassess her symptoms at her follow-up   Leia Alf, MD, FACS [facility] [phone] Pager [phone]

## 2019-10-23 ENCOUNTER — Ambulatory Visit (INDEPENDENT_AMBULATORY_CARE_PROVIDER_SITE_OTHER): Payer: Medicare HMO | Admitting: Cardiology

## 2019-10-23 ENCOUNTER — Encounter: Payer: Self-pay | Admitting: Cardiology

## 2019-10-23 VITALS — BP 144/82 | HR 91 | Ht 67.0 in | Wt 174.0 lb

## 2019-10-23 DIAGNOSIS — I422 Other hypertrophic cardiomyopathy: Secondary | ICD-10-CM | POA: Diagnosis not present

## 2019-10-23 DIAGNOSIS — I6529 Occlusion and stenosis of unspecified carotid artery: Secondary | ICD-10-CM

## 2019-10-23 DIAGNOSIS — I739 Peripheral vascular disease, unspecified: Secondary | ICD-10-CM

## 2019-10-23 DIAGNOSIS — I10 Essential (primary) hypertension: Secondary | ICD-10-CM

## 2019-10-23 MED ORDER — METOPROLOL SUCCINATE ER 25 MG PO TB24
25.0000 mg | ORAL_TABLET | Freq: Every day | ORAL | 1 refills | Status: DC
Start: 2019-10-23 — End: 2020-04-23

## 2019-10-23 NOTE — Patient Instructions (Signed)
Medication Instructions:   Your physician has recommended you make the following change in your medication:   Start metoprolol succinate 25 mg by mouth daily  Continue other medications the same  Labwork:  NONE  Testing/Procedures:  Your physician has requested that you have a upper extremity arterial duplex. This test is an ultrasound of the arteries in the arms. It looks at arterial blood flow in the arms. Allow one hour for Lower and Upper Arterial scans. There are no restrictions or special instructions  Follow-Up:  Your physician recommends that you schedule a follow-up appointment in: pending  Any Other Special Instructions Will Be Listed Below (If Applicable).  If you need a refill on your cardiac medications before your next appointment, please call your pharmacy.

## 2019-10-23 NOTE — Progress Notes (Signed)
Cardiology Office Note  Date: 10/23/2019   ID: Leslie [name], DOB 1945/05/06, MRN [id number]  PCP:  Leslie Albee, MD  Cardiologist:  Leslie Lesches, MD Electrophysiologist:  None   Chief Complaint  Patient presents with  . Establish follow-up of diastolic heart failure    History of Present Illness: Leslie [name] is a 74 y.o. female referred for cardiology consultation by Ms. Leslie Cables NP with Dr. Anastasio [name] for follow-up of diastolic heart failure.  I reviewed her available records and updated the chart.  She has a history of what looks to be hypertrophic cardiomyopathy based on previous echocardiogram done at [place] back in 2018 and more recent study in 2020.  She has severe LVH but preserved LVEF.  The most recent echocardiogram in December 2020 did not describe any resting LVOT gradient.  She states that she has had trouble with blood pressure control, medications have been adjusted recently and are outlined below.  She is not on an AV nodal blocker at this time.  I personally reviewed her ECG which shows sinus rhythm with left atrial enlargement, R prime in lead V1.  She also mentions to me that she checks her blood pressure regularly in both arms and that the right arm is always higher than the left arm.  I checked her blood pressure today.  Right arm blood pressure was 188/92 and left arm blood pressure was 144/82.  She was seen recently by Leslie [name] in consultation with history of right CEA in 2014, also PAD with known occlusion of right superficial femoral artery stent.  Recent vascular studies are noted below.  Past Medical History:  Diagnosis Date  . Back pain    DDD  . Carotid artery occlusion   . Cough   . Diastolic heart failure ([place])   . Dry skin   . Essential hypertension   . History of bruising easily   . History of colon polyps   . History of migraine   . History of pneumonia   . Hyperlipidemia   . PAD (peripheral artery disease) ([place])   . Panic attack   .  Rheumatoid arthritis(714.0)     Past Surgical History:  Procedure Laterality Date  . ABDOMINAL HYSTERECTOMY    . ANGIOPLASTY  04/20/2012   Procedure: ANGIOPLASTY;  Surgeon: Serafina Mitchell, MD;  Location: [facility] OR;  Service: Vascular;  Laterality: Right;  Using 1cm x6 cm vascu-guard patch   . BACK SURGERY    . CARDIAC CATHETERIZATION  01/31/2005  . CAROTID ANGIOGRAM N/A 01/06/2012   Procedure: CAROTID ANGIOGRAM;  Surgeon: Lorretta Harp, MD;  [facility] C[facility] LAB;  Service: Cardiovascular;  Laterality: N/A;  . CARPAL TUNNEL RELEASE    . CHOLECYSTECTOMY    . COLONOSCOPY    . CORONARY ANGIOPLASTY  01/06/12/2005/2006    2 stents  . EGD/TCS  03/2004   Dr. Gala Romney: Tiny distal esophageal erosions, prepyloric antral erosions/ulcerations, internal hemorrhoids, diminutive polyp in the rectosigmoid junction removed (hyperplastic).  Pathology not available but reportedly she had H. pylori which was treated.  Marland Kitchen ENDARTERECTOMY  04/20/2012   Procedure: ENDARTERECTOMY CAROTID;  Surgeon: Serafina Mitchell, MD;  Location: [facility];  Service: Vascular;  Laterality: Right;  . ESOPHAGOGASTRODUODENOSCOPY  07/2004   Dr. Gala Romney: Previous antral ulcer was completely healed.  EGD normal.  . GIVENS CAPSULE STUDY     ???  . leg stents    . LOWER EXTREMITY ANGIOGRAM N/A 01/06/2012   Procedure: LOWER EXTREMITY ANGIOGRAM;  Surgeon: Lorretta Harp, MD;  Location: [facility] CATH LAB;  Service: Cardiovascular;  Laterality: N/A;  . TOTAL KNEE ARTHROPLASTY    . TRIGGER FINGER RELEASE    . ulnar nerve surgery      Current Outpatient Medications  Medication Sig Dispense Refill  . albuterol (VENTOLIN HFA) 108 (90 Base) MCG/ACT inhaler INHALE 2 INHALATIONS INTO THE LUNGS EVERY 6 HOURS AS NEEDED FOR WHEEZING 54 g 6  . aspirin EC 81 MG tablet Take 81 mg by mouth daily.    . Cholecalciferol (VITAMIN D-3) 125 MCG (5000 UT) TABS Take 2 tablets by mouth daily.    . cilostazol (PLETAL) 100 MG tablet TAKE 1 TABLET(100 MG) BY MOUTH TWICE  DAILY 90 tablet 0  . furosemide (LASIX) 20 MG tablet TAKE 1 TABLET(20 MG) BY MOUTH DAILY AS NEEDED FOR SWELLING 90 tablet 0  . hydroquinone 4 % cream Apply topically 2 (two) times daily. 28.35 g 0  . losartan (COZAAR) 50 MG tablet Take 1 tablet (50 mg total) by mouth 2 (two) times daily. 180 tablet 1  . pantoprazole (PROTONIX) 40 MG tablet Take 1 tablet (40 mg total) by mouth daily. 30 tablet 3  . potassium chloride (KLOR-CON) 10 MEQ tablet TAKE 1 TABLET BY MOUTH EVERY DAY WHEN YOU TAKE FUROSEMIDE(LASIX) 90 tablet 0  . rosuvastatin (CRESTOR) 10 MG tablet Take 1 tablet (10 mg total) by mouth daily. 90 tablet 0  . metoprolol succinate (TOPROL XL) 25 MG 24 hr tablet Take 1 tablet (25 mg total) by mouth daily. 90 tablet 1   No current facility-administered medications for this visit.   Allergies:  Latex, Tape, and Atorvastatin   Social History: The patient  reports that she has been smoking cigarettes. She started smoking about 60 years ago. She has a 25.00 pack-year smoking history. She has never used smokeless tobacco. She reports previous alcohol use. She reports that she does not use drugs.   Family History: The patient's family history includes Cancer in her father; Diabetes in her brother and sister; Heart attack in her brother; Heart disease in her brother; Hyperlipidemia in her mother; Hypertension in her brother, mother, and sister.   ROS:   No palpitations or syncope.  Physical Exam: VS:  BP (!) 144/82   Pulse 91   Ht 5\' 7"  (1.702 m)   Wt 174 lb (78.9 kg)   SpO2 90%   BMI 27.25 kg/m , BMI Body mass index is 27.25 kg/m.  Wt Readings from Last 3 Encounters:  10/23/19 174 lb (78.9 kg)  10/21/19 172 lb (78 kg)  10/08/19 171 lb 9.6 oz (77.8 kg)    General: Patient appears comfortable at rest. HEENT: Conjunctiva and lids normal, wearing a mask. Neck: Supple, no elevated JVP or carotid bruits, no thyromegaly. Lungs: Clear to auscultation, nonlabored breathing at rest. Cardiac:  Regular rate and rhythm, no S3, soft systolic murmur, no pericardial rub. Abdomen: Soft, nontender, bowel sounds present. Extremities: No pitting edema, distal pulses 2+. Skin: Warm and dry. Musculoskeletal: No kyphosis. Neuropsychiatric: Alert and oriented x3, affect grossly appropriate.  ECG:  An ECG dated 03/19/2015 was personally reviewed today and demonstrated:  Sinus rhythm with LVH and repolarization abnormalities.  Recent Labwork: 02/04/2019: TSH 0.26 09/18/2019: Hemoglobin 13.1; Platelets 239 10/03/2019: ALT 12; AST 16; BUN 10; Creat 0.96; Potassium 4.5; Sodium 133     Component Value Date/Time   CHOL 164 09/03/2019 0959   TRIG 109 09/03/2019 0959   HDL 50 09/03/2019 0959   CHOLHDL  3.3 09/03/2019 0959   VLDL 35 10/04/2011 0605   LDLCALC 93 09/03/2019 0959    Other Studies Reviewed Today:  Echocardiogram 05/04/2016 ([facility]): INTERPRETATION HYPERCONTRACTILE LV FUNCTION  WITH SEVERE LVH NORMAL RIGHT VENTRICULAR SYSTOLIC FUNCTION TRIVIAL REGURGITATION NOTED (See above) NO VALVULAR STENOSIS THERE IS CAVITY OBLITERATION THERE IS AN OUTFLOW TRACK GRADIENT OF 47mmHg at REST NO CHANGE WITH VALSALVA HYPERDYNAMIC GRADIENT NO VALVULAR STENOSIS COMPARED TO PRIOR:LVOT GRADIENT MORE EVIDENT NO ASH OR SAM  Echocardiogram 03/11/2019: 1. Left ventricular ejection fraction, by visual estimation, is 65 to  70%. The left ventricle has normal function. There is severely increased  left ventricular hypertrophy.  2. Elevated left atrial pressure.  3. Left ventricular diastolic parameters are consistent with Grade I  diastolic dysfunction (impaired relaxation).  4. The left ventricle has no regional wall motion abnormalities.  5. LV function is vigorous with near cavity obliteration in systole.  There is turbulent flow through the LVOT but no significant obstruction at  rest.  6. Global right ventricle has normal systolic function.The right  ventricular size is normal. No  increase in right ventricular wall  thickness.  7. Left atrial size was mildly dilated.  8. Right atrial size was mildly dilated.  9. Mild mitral annular calcification.  10. The mitral valve is abnormal. Mild mitral valve regurgitation.  11. The tricuspid valve is normal in structure. Tricuspid valve  regurgitation is mild.  12. The aortic valve is tricuspid. Aortic valve regurgitation is not  visualized. Mild aortic valve sclerosis without stenosis.  13. The pulmonic valve was not well visualized. Pulmonic valve  regurgitation is not visualized.  14. Moderately elevated pulmonary artery systolic pressure.  15. The inferior vena cava is dilated in size with <50% respiratory  variability, suggesting right atrial pressure of 15 mmHg.   Carotid Dopplers 06/25/2019: IMPRESSION: 1. Moderate plaque at the level of the left carotid bulb and proximal left ICA with estimated 50-69% left ICA stenosis. 2. No evidence of recurrent right-sided carotid stenosis after prior endarterectomy.  Lower extremity arterial Dopplers 08/12/2019: Summary:  Right: Resting right ankle-brachial index indicates moderate right lower  extremity arterial disease. The right toe-brachial index is abnormal.   Left: Resting left ankle-brachial index indicates mild left lower  extremity arterial disease. The left toe-brachial index is abnormal.   Assessment and Plan:  1.  Hypertrophic cardiomyopathy based on echocardiography, no substantial resting LVOT gradient by her most recent study in December with severe LVH and mild diastolic dysfunction.  She is currently on losartan with dose recently uptitrated.  We will add Toprol-XL 25 mg daily.  She uses Lasix with potassium supplement for leg swelling, I recommended that she use this on an as-needed basis if possible rather than standing.  Recent lab work as outlined above.  2.  Differential arm blood pressures as described above.  We will obtain arterial Dopplers.  She  does not describe any obvious symptoms with using her arms.  3.  Known PAD and carotid artery disease, recently established with Dr. Trula Wirtz.  4.  Mixed hyperlipidemia, currently on Crestor.  Medication Adjustments/Labs and Tests Ordered: Current medicines are reviewed at length with the patient today.  Concerns regarding medicines are outlined above.   Tests Ordered: Orders Placed This Encounter  Procedures  . EKG 12-Lead  . VAS [place] UPPER EXTREMITY ARTERIAL DUPLEX    Medication Changes: Meds ordered this encounter  Medications  . metoprolol succinate (TOPROL XL) 25 MG 24 hr tablet    Sig: Take 1  tablet (25 mg total) by mouth daily.    Dispense:  90 tablet    Refill:  1    10/23/2019 NEW    Disposition:  Follow up test results and determine disposition.  Signed, Satira Sark, MD, [facility] 10/23/2019 3:51 PM    [facility] [place], [place], [place] [address] Phone: [phone]; Fax: [phone]

## 2019-10-24 ENCOUNTER — Other Ambulatory Visit: Payer: Self-pay | Admitting: Cardiology

## 2019-10-24 DIAGNOSIS — R6889 Other general symptoms and signs: Secondary | ICD-10-CM

## 2019-10-24 DIAGNOSIS — I739 Peripheral vascular disease, unspecified: Secondary | ICD-10-CM

## 2019-10-28 ENCOUNTER — Other Ambulatory Visit: Payer: Self-pay

## 2019-10-28 ENCOUNTER — Telehealth (INDEPENDENT_AMBULATORY_CARE_PROVIDER_SITE_OTHER): Payer: Self-pay

## 2019-10-28 ENCOUNTER — Other Ambulatory Visit (INDEPENDENT_AMBULATORY_CARE_PROVIDER_SITE_OTHER): Payer: Self-pay | Admitting: Internal Medicine

## 2019-10-28 ENCOUNTER — Ambulatory Visit (INDEPENDENT_AMBULATORY_CARE_PROVIDER_SITE_OTHER): Payer: Medicare HMO | Admitting: Internal Medicine

## 2019-10-28 ENCOUNTER — Encounter (INDEPENDENT_AMBULATORY_CARE_PROVIDER_SITE_OTHER): Payer: Self-pay | Admitting: Internal Medicine

## 2019-10-28 VITALS — BP 115/70 | HR 94 | Temp 96.8°F | Ht 67.0 in | Wt 173.4 lb

## 2019-10-28 DIAGNOSIS — I10 Essential (primary) hypertension: Secondary | ICD-10-CM | POA: Diagnosis not present

## 2019-10-28 DIAGNOSIS — M545 Low back pain, unspecified: Secondary | ICD-10-CM

## 2019-10-28 LAB — COMPLETE METABOLIC PANEL WITH GFR
AG Ratio: 1.4 (calc) (ref 1.0–2.5)
ALT: 12 U/L (ref 6–29)
AST: 16 U/L (ref 10–35)
Albumin: 4 g/dL (ref 3.6–5.1)
Alkaline phosphatase (APISO): 53 U/L (ref 37–153)
BUN/Creatinine Ratio: 14 (calc) (ref 6–22)
BUN: 13 mg/dL (ref 7–25)
CO2: 30 mmol/L (ref 20–32)
Calcium: 9.6 mg/dL (ref 8.6–10.4)
Chloride: 97 mmol/L — ABNORMAL LOW (ref 98–110)
Creat: 0.95 mg/dL — ABNORMAL HIGH (ref 0.60–0.93)
GFR, Est African American: 69 mL/min/{1.73_m2} (ref >=60)
GFR, Est Non African American: 60 mL/min/{1.73_m2} (ref >=60)
Globulin: 2.9 g/dL (calc) (ref 1.9–3.7)
Glucose, Bld: 114 mg/dL — ABNORMAL HIGH (ref 65–99)
Potassium: 4.8 mmol/L (ref 3.5–5.3)
Sodium: 135 mmol/L (ref 135–146)
Total Bilirubin: 0.5 mg/dL (ref 0.2–1.2)
Total Protein: 6.9 g/dL (ref 6.1–8.1)

## 2019-10-28 NOTE — Telephone Encounter (Signed)
Okay, I have put the order in for the MRI lumbar spine.  Thanks.

## 2019-10-28 NOTE — Progress Notes (Signed)
Metrics: Intervention Frequency ACO  Documented Smoking Status Yearly  Screened one or more times in 24 months  Cessation Counseling or  Active cessation medication Past 24 months  Past 24 months   Guideline developer: UpToDate (See UpToDate for funding source) Date Released: 2014       Wellness Office Visit  Subjective:  Patient ID: Leslie [name], female    DOB: 06-14-1945  Age: 74 y.o. MRN: [id number]  CC: This lady comes in for follow-up of hypertension. HPI  Her blood pressure readings in both her arms are always different and she saw Dr. Domenic Polite, cardiology who is going to perform further testing regarding this. However, she has been taking the higher dose of losartan.  She has tolerated this well. She continues to have low back pain and she did get a call from neurosurgery and she will follow-up. Past Medical History:  Diagnosis Date  . Back pain    DDD  . Carotid artery occlusion   . Cough   . Diastolic heart failure ([place])   . Dry skin   . Essential hypertension   . History of bruising easily   . History of colon polyps   . History of migraine   . History of pneumonia   . Hyperlipidemia   . PAD (peripheral artery disease) ([place])   . Panic attack   . Rheumatoid arthritis(714.0)    Past Surgical History:  Procedure Laterality Date  . ABDOMINAL HYSTERECTOMY    . ANGIOPLASTY  04/20/2012   Procedure: ANGIOPLASTY;  Surgeon: Serafina Mitchell, MD;  Location: [facility] OR;  Service: Vascular;  Laterality: Right;  Using 1cm x6 cm vascu-guard patch   . BACK SURGERY    . CARDIAC CATHETERIZATION  01/31/2005  . CAROTID ANGIOGRAM N/A 01/06/2012   Procedure: CAROTID ANGIOGRAM;  Surgeon: Lorretta Harp, MD;  Location[facility] CATH LAB;  Service: Cardiovascular;  Laterality: N/A;  . CARPAL TUNNEL RELEASE    . CHOLECYSTECTOMY    . COLONOSCOPY    . CORONARY ANGIOPLASTY  01/06/12/2005/2006    2 stents  . EGD/TCS  03/2004   Dr. Gala Romney: Tiny distal esophageal erosions, prepyloric antral  erosions/ulcerations, internal hemorrhoids, diminutive polyp in the rectosigmoid junction removed (hyperplastic).  Pathology not available but reportedly she had H. pylori which was treated.  Marland Kitchen ENDARTERECTOMY  04/20/2012   Procedure: ENDARTERECTOMY CAROTID;  Surgeon: Serafina Mitchell, MD;  Location: [place];  Service: Vascular;  Laterality: Right;  . ESOPHAGOGASTRODUODENOSCOPY  07/2004   Dr. Gala Romney: Previous antral ulcer was completely healed.  EGD normal.  . GIVENS CAPSULE STUDY     ???  . leg stents    . LOWER EXTREMITY ANGIOGRAM N/A 01/06/2012   Procedure: LOWER EXTREMITY ANGIOGRAM;  Surgeon: Lorretta Harp, MD;  Location: [facility] CATH LAB;  Service: Cardiovascular;  Laterality: N/A;  . TOTAL KNEE ARTHROPLASTY    . TRIGGER FINGER RELEASE    . ulnar nerve surgery       Family History  Problem Relation Age of Onset  . Hyperlipidemia Mother   . Hypertension Mother   . Cancer Father        Brain  . Diabetes Sister   . Hypertension Sister   . Diabetes Brother   . Heart disease Brother   . Hypertension Brother   . Heart attack Brother   . Colon cancer Neg Hx     Social History   Social History Narrative   Divorced.Lives alone.Works at [employer].   Social History  Tobacco Use  . Smoking status: Current Every Day Smoker    Packs/day: 0.50    Years: 50.00    Pack years: 25.00    Types: Cigarettes    Start date: 40  . Smokeless tobacco: Never Used  . Tobacco comment: pt states that she uses nic patches  Substance Use Topics  . Alcohol use: Not Currently    Comment: occassionally    Current Meds  Medication Sig  . albuterol (VENTOLIN HFA) 108 (90 Base) MCG/ACT inhaler INHALE 2 INHALATIONS INTO THE LUNGS EVERY 6 HOURS AS NEEDED FOR WHEEZING  . aspirin EC 81 MG tablet Take 81 mg by mouth daily.  . Cholecalciferol (VITAMIN D-3) 125 MCG (5000 UT) TABS Take 2 tablets by mouth daily.  . cilostazol (PLETAL) 100 MG tablet TAKE 1 TABLET(100 MG) BY MOUTH TWICE DAILY  . furosemide  (LASIX) 20 MG tablet TAKE 1 TABLET(20 MG) BY MOUTH DAILY AS NEEDED FOR SWELLING  . hydroquinone 4 % cream Apply topically 2 (two) times daily.  Marland Kitchen losartan (COZAAR) 50 MG tablet Take 1 tablet (50 mg total) by mouth 2 (two) times daily.  . metoprolol succinate (TOPROL XL) 25 MG 24 hr tablet Take 1 tablet (25 mg total) by mouth daily.  . pantoprazole (PROTONIX) 40 MG tablet Take 1 tablet (40 mg total) by mouth daily.  . potassium chloride (KLOR-CON) 10 MEQ tablet TAKE 1 TABLET BY MOUTH EVERY DAY WHEN YOU TAKE FUROSEMIDE(LASIX)  . rosuvastatin (CRESTOR) 10 MG tablet Take 1 tablet (10 mg total) by mouth daily.       Depression screen [facility] 2/9 06/18/2019 02/04/2019  Decreased Interest 0 0  Down, Depressed, Hopeless 0 0  PHQ - 2 Score 0 0     Objective:   Today's Vitals: BP 115/70 (BP Location: Left Arm, Patient Position: Sitting, Cuff Size: Normal)   Pulse 94   Temp (!) 96.8 F (36 C) (Temporal)   Ht 5\' 7"  (1.702 m)   Wt 173 lb 6.4 oz (78.7 kg)   SpO2 95%   BMI 27.16 kg/m  Vitals with BMI 10/28/2019 10/23/2019 10/21/2019  Height 5\' 7"  5\' 7"  -  Weight 173 lbs 6 oz 174 lbs -  BMI 94.85 46.27 -  Systolic 035 009 381  Diastolic 70 82 829  Pulse 94 91 -     Physical Exam  She looks systemically well.  Her blood pressure is much improved and this was taken on the left arm.  She looks well.     Assessment   1. Essential hypertension   2. Low back pain without sciatica, unspecified back pain laterality, unspecified chronicity       Tests ordered Orders Placed This Encounter  Procedures  . COMPLETE METABOLIC PANEL WITH GFR     Plan: 1. She will continue with losartan at the current dose which seems to be controlling her blood pressure lifting one arm.  Further evaluation by cardiology.  Check electrolytes today to make sure renal function is appropriate. 2. She will follow-up with neurosurgery regarding her low back pain which is still a problem. 3. Follow-up in 3  months.   No orders of the defined types were placed in this encounter.   Doree Albee, MD

## 2019-10-28 NOTE — Telephone Encounter (Signed)
DR Ellene Route OFFICE IS NEEDING A MRI TO BE COMPLETED PRIOR TO COMING FOR APPOINTMENT. PT CALLED TO SEE IF YOU WILL PUT A ORDER FOR HER BACK FOR THIS MATTER.

## 2019-10-29 NOTE — Progress Notes (Signed)
Please call this patient.  Let her know that her kidney function and blood tests are stable.  Continue with the same medications as before.Follow-up as scheduled.

## 2019-10-29 NOTE — Progress Notes (Signed)
Pt was called and given lab results. Pt was happy her blood work was better. She said she is working hard. Now to get relief of back hip pain.

## 2019-10-30 ENCOUNTER — Ambulatory Visit (INDEPENDENT_AMBULATORY_CARE_PROVIDER_SITE_OTHER): Payer: Medicare HMO

## 2019-10-30 ENCOUNTER — Other Ambulatory Visit: Payer: Self-pay

## 2019-10-30 DIAGNOSIS — I739 Peripheral vascular disease, unspecified: Secondary | ICD-10-CM | POA: Diagnosis not present

## 2019-10-30 DIAGNOSIS — R6889 Other general symptoms and signs: Secondary | ICD-10-CM | POA: Diagnosis not present

## 2019-11-04 ENCOUNTER — Ambulatory Visit (INDEPENDENT_AMBULATORY_CARE_PROVIDER_SITE_OTHER): Payer: Medicare HMO | Admitting: Nurse Practitioner

## 2019-11-06 ENCOUNTER — Telehealth: Payer: Self-pay | Admitting: *Deleted

## 2019-11-06 NOTE — Telephone Encounter (Signed)
-----   Message from Erma Heritage, [place] sent at 10/31/2019  9:10 AM EDT ----- Patient of Dr. Domenic Polite - Please let the patient know her upper extremity arterial study showed evidence of stenosis along her upper extremity and concerns for obstruction along the subclavian artery. If she develops any pain or discoloration along her left arm, she would seek emergent evaluation. She is already followed by Dr. Trula Covin with Vascular Surgery and I would recommended she see him back in regards to this.

## 2019-11-06 NOTE — Telephone Encounter (Signed)
Patient informed and verbalized understanding of plan. Copy sent to PCP 

## 2019-11-06 NOTE — Telephone Encounter (Signed)
-----   Message from Satira Sark, MD sent at 11/03/2019  2:00 PM EDT ----- Results reviewed.  Upper extremity arterial Dopplers suggest greater than 50% stenosis involving the subclavian artery on the left.  This likely explains differential arm blood pressures, left being less than right.  Not entirely clear that she had any active symptoms related to this based on recent discussion in the office.  Would base adjustment in antihypertensive medications on right arm blood pressures.  Plan to forward this to Dr. Trula Dante since Ms. Sonnier recently established follow-up  of PAD with him as well.

## 2019-11-11 ENCOUNTER — Ambulatory Visit: Payer: Medicare HMO | Admitting: Physician Assistant

## 2019-11-15 ENCOUNTER — Ambulatory Visit (HOSPITAL_COMMUNITY): Payer: Medicare HMO

## 2019-11-18 ENCOUNTER — Other Ambulatory Visit: Payer: Self-pay

## 2019-11-18 ENCOUNTER — Ambulatory Visit (HOSPITAL_COMMUNITY)
Admission: RE | Admit: 2019-11-18 | Discharge: 2019-11-18 | Disposition: A | Discharge status: Home / Self Care | Payer: Medicare HMO | Source: Ambulatory Visit | Attending: Internal Medicine | Admitting: Internal Medicine

## 2019-11-18 DIAGNOSIS — M545 Low back pain: Secondary | ICD-10-CM | POA: Diagnosis not present

## 2019-11-18 NOTE — Progress Notes (Signed)
Please call the patient and let her know that the MRI of her lumbar spine is abnormal.  She said she was going to follow-up with neurosurgery when I saw her on the last visit.  Please make sure she does have an appointment with neurosurgery.  If she does not, let me know if I need to order a consultation again.  Thanks.

## 2019-11-19 NOTE — Progress Notes (Signed)
Patient called.  She will not see Neurosurgery appt until after Sept 24 for visit in [place].

## 2019-11-19 NOTE — Progress Notes (Signed)
Pt did go to [place] neurosurgery. Waiting on notes to be sent over.

## 2019-12-09 ENCOUNTER — Ambulatory Visit (INDEPENDENT_AMBULATORY_CARE_PROVIDER_SITE_OTHER): Payer: Medicare HMO | Admitting: Internal Medicine

## 2019-12-13 DIAGNOSIS — M48062 Spinal stenosis, lumbar region with neurogenic claudication: Secondary | ICD-10-CM | POA: Diagnosis not present

## 2019-12-23 ENCOUNTER — Telehealth (INDEPENDENT_AMBULATORY_CARE_PROVIDER_SITE_OTHER): Payer: Self-pay | Admitting: Internal Medicine

## 2019-12-23 NOTE — Telephone Encounter (Signed)
Yes, should be okay to take celecoxib with her other medications as long as she takes it with food and never on an empty stomach.  Make sure she drinks plenty of water throughout the day also.  If she wants her back checked out, she will obviously need to make an appointment.

## 2019-12-27 ENCOUNTER — Other Ambulatory Visit (INDEPENDENT_AMBULATORY_CARE_PROVIDER_SITE_OTHER): Payer: Self-pay | Admitting: Internal Medicine

## 2020-01-21 ENCOUNTER — Telehealth (INDEPENDENT_AMBULATORY_CARE_PROVIDER_SITE_OTHER): Payer: Self-pay

## 2020-01-21 ENCOUNTER — Other Ambulatory Visit (INDEPENDENT_AMBULATORY_CARE_PROVIDER_SITE_OTHER): Payer: Self-pay

## 2020-01-21 MED ORDER — IBUPROFEN 800 MG PO TABS: 800.0000 mg | ORAL_TABLET | Freq: Three times a day (TID) | ORAL | 0 refills | Status: DC | PRN

## 2020-01-21 NOTE — Telephone Encounter (Signed)
[employer] called and stated that this patient needed a refill of Ibuprofen 800 mg that I do not see on her current medication list. [employer] can be reached at [phone] and by fax to [phone]  Please advise.

## 2020-01-24 ENCOUNTER — Other Ambulatory Visit (INDEPENDENT_AMBULATORY_CARE_PROVIDER_SITE_OTHER): Payer: Self-pay | Admitting: Internal Medicine

## 2020-01-28 ENCOUNTER — Other Ambulatory Visit: Payer: Self-pay

## 2020-01-28 ENCOUNTER — Encounter (INDEPENDENT_AMBULATORY_CARE_PROVIDER_SITE_OTHER): Payer: Self-pay | Admitting: Internal Medicine

## 2020-01-28 ENCOUNTER — Ambulatory Visit (INDEPENDENT_AMBULATORY_CARE_PROVIDER_SITE_OTHER): Payer: Medicare HMO | Admitting: Internal Medicine

## 2020-01-28 VITALS — BP 180/100 | HR 79 | Temp 98.4°F | Resp 18 | Ht 67.0 in | Wt 175.0 lb

## 2020-01-28 DIAGNOSIS — R0683 Snoring: Secondary | ICD-10-CM

## 2020-01-28 DIAGNOSIS — E782 Mixed hyperlipidemia: Secondary | ICD-10-CM

## 2020-01-28 DIAGNOSIS — I10 Essential (primary) hypertension: Secondary | ICD-10-CM

## 2020-01-28 DIAGNOSIS — E559 Vitamin D deficiency, unspecified: Secondary | ICD-10-CM

## 2020-01-28 NOTE — Progress Notes (Signed)
Metrics: Intervention Frequency ACO  Documented Smoking Status Yearly  Screened one or more times in 24 months  Cessation Counseling or  Active cessation medication Past 24 months  Past 24 months   Guideline developer: UpToDate (See UpToDate for funding source) Date Released: 2014       Wellness Office Visit  Subjective:  Patient ID: Leslie [name], female    DOB: 08-09-1945  Age: 74 y.o. MRN: [id number]  CC: This lady comes in for follow-up of hypertension, hyperlipidemia and vitamin D deficiency. HPI  She has no history of coronary artery disease, cerebrovascular disease and she is taking statin therapy.  She does not really wish to take statin therapy at all. She continues on losartan and metoprolol extended release. She continues on vitamin D3 supplementation for vitamin D deficiency. Past Medical History:  Diagnosis Date  . Back pain    DDD  . Carotid artery occlusion   . Cough   . Diastolic heart failure ([place])   . Dry skin   . Essential hypertension   . History of bruising easily   . History of colon polyps   . History of migraine   . History of pneumonia   . Hyperlipidemia   . PAD (peripheral artery disease) ([place])   . Panic attack   . Rheumatoid arthritis(714.0)    Past Surgical History:  Procedure Laterality Date  . ABDOMINAL HYSTERECTOMY    . ANGIOPLASTY  04/20/2012   Procedure: ANGIOPLASTY;  Surgeon: Serafina Mitchell, MD;  Location: [facility] OR;  Service: Vascular;  Laterality: Right;  Using 1cm x6 cm vascu-guard patch   . BACK SURGERY    . CARDIAC CATHETERIZATION  01/31/2005  . CAROTID ANGIOGRAM N/A 01/06/2012   Procedure: CAROTID ANGIOGRAM;  Surgeon: Lorretta Harp, MD;  Location: [facility] CATH LAB;  Service: Cardiovascular;  Laterality: N/A;  . CARPAL TUNNEL RELEASE    . CHOLECYSTECTOMY    . COLONOSCOPY    . CORONARY ANGIOPLASTY  01/06/12/2005/2006    2 stents  . EGD/TCS  03/2004   Dr. Gala Romney: Tiny distal esophageal erosions, prepyloric antral erosions/ulcerations,  internal hemorrhoids, diminutive polyp in the rectosigmoid junction removed (hyperplastic).  Pathology not available but reportedly she had H. pylori which was treated.  Marland Kitchen ENDARTERECTOMY  04/20/2012   Procedure: ENDARTERECTOMY CAROTID;  Surgeon: Serafina Mitchell, MD;  Location: [place];  Service: Vascular;  Laterality: Right;  . ESOPHAGOGASTRODUODENOSCOPY  07/2004   Dr. Gala Romney: Previous antral ulcer was completely healed.  EGD normal.  . GIVENS CAPSULE STUDY     ???  . leg stents    . LOWER EXTREMITY ANGIOGRAM N/A 01/06/2012   Procedure: LOWER EXTREMITY ANGIOGRAM;  Surgeon: Lorretta Harp, MD;  Location[facility] LAB;  Service: Cardiovascular;  Laterality: N/A;  . TOTAL KNEE ARTHROPLASTY    . TRIGGER FINGER RELEASE    . ulnar nerve surgery       Family History  Problem Relation Age of Onset  . Hyperlipidemia Mother   . Hypertension Mother   . Cancer Father        Brain  . Diabetes Sister   . Hypertension Sister   . Diabetes Brother   . Heart disease Brother   . Hypertension Brother   . Heart attack Brother   . Colon cancer Neg Hx     Social History   Social History Narrative   Divorced.Lives alone.Works at [employer].   Social History   Tobacco Use  . Smoking status: Current Every Day Smoker  Packs/day: 0.50    Years: 50.00    Pack years: 25.00    Types: Cigarettes    Start date: 57  . Smokeless tobacco: Never Used  . Tobacco comment: pt states that she uses nic patches  Substance Use Topics  . Alcohol use: Not Currently    Comment: occassionally    Current Meds  Medication Sig  . albuterol (VENTOLIN HFA) 108 (90 Base) MCG/ACT inhaler INHALE 2 INHALATIONS INTO THE LUNGS EVERY 6 HOURS AS NEEDED FOR WHEEZING  . cilostazol (PLETAL) 100 MG tablet TAKE 1 TABLET(100 MG) BY MOUTH TWICE DAILY  . hydroquinone 4 % cream Apply topically 2 (two) times daily.  . IBU 800 MG tablet TAKE 1 TABLET (800 MG TOTAL) BY MOUTH EVERY 8 (EIGHT) HOURS AS NEEDED.  Marland Kitchen losartan (COZAAR)  50 MG tablet Take 1 tablet (50 mg total) by mouth 2 (two) times daily.  . metoprolol succinate (TOPROL XL) 25 MG 24 hr tablet Take 1 tablet (25 mg total) by mouth daily.  . pantoprazole (PROTONIX) 40 MG tablet TAKE 1 TABLET EVERY DAY  . rosuvastatin (CRESTOR) 10 MG tablet TAKE 1 TABLET EVERY DAY      Depression screen [facility] 2/9 06/18/2019 02/04/2019  Decreased Interest 0 0  Down, Depressed, Hopeless 0 0  PHQ - 2 Score 0 0     Objective:   Today's Vitals: BP (!) 180/100 (BP Location: Right Arm, Patient Position: Sitting, Cuff Size: Normal) Comment: 140/89 left arm  Pulse 79   Temp 98.4 F (36.9 C) (Temporal)   Resp 18   Ht 5\' 7"  (1.702 m)   Wt 175 lb (79.4 kg)   SpO2 97%   BMI 27.41 kg/m  Vitals with BMI 01/28/2020 10/28/2019 10/23/2019  Height 5\' 7"  5\' 7"  5\' 7"   Weight 175 lbs 173 lbs 6 oz 174 lbs  BMI 27.4 31.49 70.26  Systolic 378 588 502  Diastolic 774 70 82  Pulse 79 94 91     Physical Exam  Blood pressure elevated on the right arm but the left arm was reportedly in the normal range.  She is alert and orientated without any focal neurological signs.     Assessment   1. Essential hypertension   2. Mixed hyperlipidemia   3. Vitamin D deficiency disease   4. Snoring       Tests ordered No orders of the defined types were placed in this encounter.    Plan: 1. She will continue with current losartan and metoprolol and I have recommended she take the whole tablet of losartan instead of half a tablet that she is currently taking. 2. She does not really want to be on statin therapy because of side effects and I have told her that she can discontinue it and we will check levels on the next visit. 3. She will continue with vitamin D3 supplementation as before. 4. She did mention snoring today and I offered her to be evaluated for sleep apnea.  She would like to hold off for the time being. 5. I also discussed the recommendation of getting COVID-19 booster vaccine now  and she will think about it. 6. Follow-up in about 6 weeks when we will repeat blood work including lipid panel.   No orders of the defined types were placed in this encounter.   Doree Albee, MD

## 2020-03-07 ENCOUNTER — Encounter (HOSPITAL_COMMUNITY): Payer: Self-pay | Admitting: Emergency Medicine

## 2020-03-07 ENCOUNTER — Ambulatory Visit
Admission: EM | Admit: 2020-03-07 | Discharge: 2020-03-07 | Disposition: A | Discharge status: Home / Self Care | Payer: Medicare HMO

## 2020-03-07 ENCOUNTER — Other Ambulatory Visit: Payer: Self-pay

## 2020-03-07 ENCOUNTER — Emergency Department (HOSPITAL_COMMUNITY): Payer: Medicare HMO

## 2020-03-07 ENCOUNTER — Inpatient Hospital Stay (HOSPITAL_COMMUNITY)
Admission: EM | Admit: 2020-03-07 | Discharge: 2020-03-09 | DRG: 190 | Disposition: A | Discharge status: Home / Self Care | Payer: Medicare HMO | Attending: Internal Medicine | Admitting: Internal Medicine

## 2020-03-07 DIAGNOSIS — Z833 Family history of diabetes mellitus: Secondary | ICD-10-CM

## 2020-03-07 DIAGNOSIS — Z83438 Family history of other disorder of lipoprotein metabolism and other lipidemia: Secondary | ICD-10-CM | POA: Diagnosis not present

## 2020-03-07 DIAGNOSIS — I509 Heart failure, unspecified: Secondary | ICD-10-CM

## 2020-03-07 DIAGNOSIS — Z20822 Contact with and (suspected) exposure to covid-19: Secondary | ICD-10-CM | POA: Diagnosis not present

## 2020-03-07 DIAGNOSIS — Z86010 Personal history of colonic polyps: Secondary | ICD-10-CM

## 2020-03-07 DIAGNOSIS — M069 Rheumatoid arthritis, unspecified: Secondary | ICD-10-CM | POA: Diagnosis not present

## 2020-03-07 DIAGNOSIS — I169 Hypertensive crisis, unspecified: Secondary | ICD-10-CM | POA: Diagnosis present

## 2020-03-07 DIAGNOSIS — I5030 Unspecified diastolic (congestive) heart failure: Secondary | ICD-10-CM | POA: Diagnosis not present

## 2020-03-07 DIAGNOSIS — I6529 Occlusion and stenosis of unspecified carotid artery: Secondary | ICD-10-CM | POA: Diagnosis present

## 2020-03-07 DIAGNOSIS — R0600 Dyspnea, unspecified: Secondary | ICD-10-CM

## 2020-03-07 DIAGNOSIS — Z9049 Acquired absence of other specified parts of digestive tract: Secondary | ICD-10-CM | POA: Diagnosis not present

## 2020-03-07 DIAGNOSIS — I5033 Acute on chronic diastolic (congestive) heart failure: Secondary | ICD-10-CM | POA: Diagnosis present

## 2020-03-07 DIAGNOSIS — Z79899 Other long term (current) drug therapy: Secondary | ICD-10-CM | POA: Diagnosis not present

## 2020-03-07 DIAGNOSIS — Z91040 Latex allergy status: Secondary | ICD-10-CM

## 2020-03-07 DIAGNOSIS — Z720 Tobacco use: Secondary | ICD-10-CM | POA: Diagnosis present

## 2020-03-07 DIAGNOSIS — I10 Essential (primary) hypertension: Secondary | ICD-10-CM | POA: Diagnosis present

## 2020-03-07 DIAGNOSIS — I5031 Acute diastolic (congestive) heart failure: Secondary | ICD-10-CM | POA: Diagnosis not present

## 2020-03-07 DIAGNOSIS — I739 Peripheral vascular disease, unspecified: Secondary | ICD-10-CM | POA: Diagnosis present

## 2020-03-07 DIAGNOSIS — I701 Atherosclerosis of renal artery: Secondary | ICD-10-CM | POA: Diagnosis present

## 2020-03-07 DIAGNOSIS — M7989 Other specified soft tissue disorders: Secondary | ICD-10-CM | POA: Diagnosis present

## 2020-03-07 DIAGNOSIS — Z955 Presence of coronary angioplasty implant and graft: Secondary | ICD-10-CM

## 2020-03-07 DIAGNOSIS — E785 Hyperlipidemia, unspecified: Secondary | ICD-10-CM | POA: Diagnosis present

## 2020-03-07 DIAGNOSIS — G43909 Migraine, unspecified, not intractable, without status migrainosus: Secondary | ICD-10-CM | POA: Diagnosis present

## 2020-03-07 DIAGNOSIS — I517 Cardiomegaly: Secondary | ICD-10-CM | POA: Diagnosis not present

## 2020-03-07 DIAGNOSIS — Z8249 Family history of ischemic heart disease and other diseases of the circulatory system: Secondary | ICD-10-CM | POA: Diagnosis not present

## 2020-03-07 DIAGNOSIS — Z91048 Other nonmedicinal substance allergy status: Secondary | ICD-10-CM

## 2020-03-07 DIAGNOSIS — Z9114 Patient's other noncompliance with medication regimen: Secondary | ICD-10-CM

## 2020-03-07 DIAGNOSIS — J9601 Acute respiratory failure with hypoxia: Secondary | ICD-10-CM | POA: Diagnosis not present

## 2020-03-07 DIAGNOSIS — F17210 Nicotine dependence, cigarettes, uncomplicated: Secondary | ICD-10-CM | POA: Diagnosis present

## 2020-03-07 DIAGNOSIS — Z90710 Acquired absence of both cervix and uterus: Secondary | ICD-10-CM

## 2020-03-07 DIAGNOSIS — R609 Edema, unspecified: Secondary | ICD-10-CM

## 2020-03-07 DIAGNOSIS — Z7982 Long term (current) use of aspirin: Secondary | ICD-10-CM

## 2020-03-07 DIAGNOSIS — J90 Pleural effusion, not elsewhere classified: Secondary | ICD-10-CM | POA: Diagnosis not present

## 2020-03-07 DIAGNOSIS — J9811 Atelectasis: Secondary | ICD-10-CM | POA: Diagnosis not present

## 2020-03-07 DIAGNOSIS — I110 Hypertensive heart disease with heart failure: Secondary | ICD-10-CM | POA: Diagnosis present

## 2020-03-07 DIAGNOSIS — R0602 Shortness of breath: Secondary | ICD-10-CM | POA: Diagnosis not present

## 2020-03-07 DIAGNOSIS — J441 Chronic obstructive pulmonary disease with (acute) exacerbation: Principal | ICD-10-CM | POA: Diagnosis present

## 2020-03-07 DIAGNOSIS — E876 Hypokalemia: Secondary | ICD-10-CM | POA: Diagnosis present

## 2020-03-07 DIAGNOSIS — Z888 Allergy status to other drugs, medicaments and biological substances status: Secondary | ICD-10-CM

## 2020-03-07 DIAGNOSIS — J449 Chronic obstructive pulmonary disease, unspecified: Secondary | ICD-10-CM | POA: Diagnosis present

## 2020-03-07 LAB — COMPREHENSIVE METABOLIC PANEL
ALT: 24 U/L (ref 0–44)
AST: 27 U/L (ref 15–41)
Albumin: 3.6 g/dL (ref 3.5–5.0)
Alkaline Phosphatase: 59 U/L (ref 38–126)
Anion gap: 11 (ref 5–15)
BUN: 11 mg/dL (ref 8–23)
CO2: 29 mmol/L (ref 22–32)
Calcium: 8.7 mg/dL — ABNORMAL LOW (ref 8.9–10.3)
Chloride: 95 mmol/L — ABNORMAL LOW (ref 98–111)
Creatinine, Ser: 0.98 mg/dL (ref 0.44–1.00)
GFR, Estimated: >60 mL/min (ref >60)
Glucose, Bld: 157 mg/dL — ABNORMAL HIGH (ref 70–99)
Potassium: 3.4 mmol/L — ABNORMAL LOW (ref 3.5–5.1)
Sodium: 135 mmol/L (ref 135–145)
Total Bilirubin: 1.5 mg/dL — ABNORMAL HIGH (ref 0.3–1.2)
Total Protein: 7.4 g/dL (ref 6.5–8.1)

## 2020-03-07 LAB — CBC WITH DIFFERENTIAL/PLATELET
Abs Immature Granulocytes: 0.03 10*3/uL (ref 0.00–0.07)
Basophils Absolute: 0 10*3/uL (ref 0.0–0.1)
Basophils Relative: 1 %
Eosinophils Absolute: 0 10*3/uL (ref 0.0–0.5)
Eosinophils Relative: 1 %
HCT: 42.5 % (ref 36.0–46.0)
Hemoglobin: 12.8 g/dL (ref 12.0–15.0)
Immature Granulocytes: 1 %
Lymphocytes Relative: 22 %
Lymphs Abs: 1.3 10*3/uL (ref 0.7–4.0)
MCH: 26.8 pg (ref 26.0–34.0)
MCHC: 30.1 g/dL (ref 30.0–36.0)
MCV: 89.1 fL (ref 80.0–100.0)
Monocytes Absolute: 0.7 10*3/uL (ref 0.1–1.0)
Monocytes Relative: 11 %
Neutro Abs: 3.9 10*3/uL (ref 1.7–7.7)
Neutrophils Relative %: 64 %
Platelets: 247 10*3/uL (ref 150–400)
RBC: 4.77 MIL/uL (ref 3.87–5.11)
RDW: 15.1 % (ref 11.5–15.5)
WBC: 5.9 10*3/uL (ref 4.0–10.5)
nRBC: 0 % (ref 0.0–0.2)

## 2020-03-07 LAB — TROPONIN I (HIGH SENSITIVITY)
Troponin I (High Sensitivity): 78 ng/L — ABNORMAL HIGH (ref <18)
Troponin I (High Sensitivity): 68 ng/L — ABNORMAL HIGH (ref <18)
Troponin I (High Sensitivity): 65 ng/L — ABNORMAL HIGH (ref <18)
Troponin I (High Sensitivity): 75 ng/L — ABNORMAL HIGH (ref <18)

## 2020-03-07 LAB — BRAIN NATRIURETIC PEPTIDE: B Natriuretic Peptide: 985 pg/mL — ABNORMAL HIGH (ref 0.0–100.0)

## 2020-03-07 LAB — RESP PANEL BY RT-PCR (FLU A&B, COVID) ARPGX2
Influenza A by PCR: NEGATIVE
Influenza B by PCR: NEGATIVE
SARS Coronavirus 2 by RT PCR: NEGATIVE

## 2020-03-07 MED ORDER — CHLORHEXIDINE GLUCONATE CLOTH 2 % EX PADS
6.0000 | MEDICATED_PAD | Freq: Every day | CUTANEOUS | Status: DC
  Administered 2020-03-07: 15:00:00 6 via TOPICAL

## 2020-03-07 MED ORDER — VITAMIN D-3 125 MCG (5000 UT) PO TABS
2.0000 | ORAL_TABLET | Freq: Every day | ORAL | Status: DC
  Filled 2020-03-07 (×2): qty 2

## 2020-03-07 MED ORDER — ONDANSETRON HCL 4 MG PO TABS: 4.0000 mg | ORAL_TABLET | Freq: Four times a day (QID) | ORAL | Status: DC | PRN

## 2020-03-07 MED ORDER — ACETAMINOPHEN 325 MG PO TABS
650.0000 mg | ORAL_TABLET | Freq: Four times a day (QID) | ORAL | Status: DC | PRN
  Administered 2020-03-08: 650 mg via ORAL
  Filled 2020-03-07: qty 2

## 2020-03-07 MED ORDER — HYDRALAZINE HCL 20 MG/ML IJ SOLN
10.0000 mg | INTRAMUSCULAR | Status: DC | PRN
  Administered 2020-03-07: 14:00:00 10 mg via INTRAVENOUS
  Filled 2020-03-07: qty 1

## 2020-03-07 MED ORDER — METOPROLOL SUCCINATE ER 25 MG PO TB24
25.0000 mg | ORAL_TABLET | Freq: Every day | ORAL | Status: DC
  Administered 2020-03-07 – 2020-03-09 (×3): 25 mg via ORAL
  Filled 2020-03-07 (×3): qty 1

## 2020-03-07 MED ORDER — SODIUM CHLORIDE 0.9% FLUSH
3.0000 mL | Freq: Two times a day (BID) | INTRAVENOUS | Status: DC
  Administered 2020-03-07 – 2020-03-09 (×4): 3 mL via INTRAVENOUS

## 2020-03-07 MED ORDER — LOSARTAN POTASSIUM 25 MG PO TABS
50.0000 mg | ORAL_TABLET | Freq: Once | ORAL | Status: AC
  Administered 2020-03-07: 12:00:00 50 mg via ORAL
  Filled 2020-03-07: qty 2

## 2020-03-07 MED ORDER — CILOSTAZOL 100 MG PO TABS
100.0000 mg | ORAL_TABLET | Freq: Two times a day (BID) | ORAL | Status: DC
  Administered 2020-03-07 – 2020-03-09 (×4): 100 mg via ORAL
  Filled 2020-03-07 (×8): qty 1

## 2020-03-07 MED ORDER — ROSUVASTATIN CALCIUM 10 MG PO TABS
10.0000 mg | ORAL_TABLET | Freq: Every day | ORAL | Status: DC
  Filled 2020-03-07: qty 1

## 2020-03-07 MED ORDER — FUROSEMIDE 10 MG/ML IJ SOLN
40.0000 mg | Freq: Once | INTRAMUSCULAR | Status: AC
  Administered 2020-03-07: 40 mg via INTRAVENOUS
  Filled 2020-03-07: qty 4

## 2020-03-07 MED ORDER — ONDANSETRON HCL 4 MG/2ML IJ SOLN: 4.0000 mg | Freq: Four times a day (QID) | INTRAMUSCULAR | Status: DC | PRN

## 2020-03-07 MED ORDER — SODIUM CHLORIDE 0.9% FLUSH: 3.0000 mL | INTRAVENOUS | Status: DC | PRN

## 2020-03-07 MED ORDER — LOSARTAN POTASSIUM 50 MG PO TABS
50.0000 mg | ORAL_TABLET | Freq: Two times a day (BID) | ORAL | Status: DC
  Administered 2020-03-07 – 2020-03-09 (×4): 50 mg via ORAL
  Filled 2020-03-07: qty 2
  Filled 2020-03-07 (×4): qty 1

## 2020-03-07 MED ORDER — FUROSEMIDE 10 MG/ML IJ SOLN
40.0000 mg | Freq: Two times a day (BID) | INTRAMUSCULAR | Status: DC
  Administered 2020-03-07 – 2020-03-08 (×2): 40 mg via INTRAVENOUS
  Filled 2020-03-07 (×2): qty 4

## 2020-03-07 MED ORDER — IPRATROPIUM-ALBUTEROL 0.5-2.5 (3) MG/3ML IN SOLN: 3.0000 mL | Freq: Four times a day (QID) | RESPIRATORY_TRACT | Status: DC | PRN

## 2020-03-07 MED ORDER — PANTOPRAZOLE SODIUM 40 MG PO TBEC
40.0000 mg | DELAYED_RELEASE_TABLET | Freq: Every day | ORAL | Status: DC
Start: 2020-03-07 — End: 2020-03-09
  Administered 2020-03-07 – 2020-03-09 (×3): 40 mg via ORAL
  Filled 2020-03-07 (×3): qty 1

## 2020-03-07 MED ORDER — POTASSIUM CHLORIDE CRYS ER 20 MEQ PO TBCR
40.0000 meq | EXTENDED_RELEASE_TABLET | Freq: Two times a day (BID) | ORAL | Status: DC
  Administered 2020-03-07 – 2020-03-08 (×4): 40 meq via ORAL
  Filled 2020-03-07 (×5): qty 2

## 2020-03-07 MED ORDER — ASPIRIN EC 81 MG PO TBEC
81.0000 mg | DELAYED_RELEASE_TABLET | Freq: Every day | ORAL | Status: DC
  Administered 2020-03-07 – 2020-03-09 (×3): 81 mg via ORAL
  Filled 2020-03-07 (×3): qty 1

## 2020-03-07 MED ORDER — ENOXAPARIN SODIUM 40 MG/0.4ML ~~LOC~~ SOLN
40.0000 mg | SUBCUTANEOUS | Status: DC
  Filled 2020-03-07: qty 0.4

## 2020-03-07 MED ORDER — ACETAMINOPHEN 650 MG RE SUPP: 650.0000 mg | Freq: Four times a day (QID) | RECTAL | Status: DC | PRN

## 2020-03-07 MED ORDER — SODIUM CHLORIDE 0.9 % IV SOLN
250.0000 mL | INTRAVENOUS | Status: DC | PRN
Start: 2020-03-07 — End: 2020-03-09

## 2020-03-07 NOTE — ED Notes (Signed)
Patient is being discharged from the Urgent Care and sent to the Emergency Department via pov . Per K. Avegno, patient is in need of higher level of care due to hypoxia. Patient is aware and verbalizes understanding of plan of care.  Vitals:   03/07/20 1023  Pulse: (!) 106  Resp: 19  Temp: 98.1 F (36.7 C)  SpO2: (!) 78%

## 2020-03-07 NOTE — ED Provider Notes (Signed)
[facility] EMERGENCY DEPARTMENT Provider Note   CSN: [id number] Arrival date & time: 03/07/20  1053     History Chief Complaint  Patient presents with  . Shortness of Breath    Leslie [name] is a 74 y.o. female.  HPI  Patient presents with shortness of breath.  Began last night.  States she felt a little short of breath yesterday walking with a friend.  States that she had trouble sleeping because when she lay down she would get more short of breath.  Has more swelling in her legs.  Also had a cough without much sputum production.  Patient states she may have a history of COPD.  Also does have a history of carrying some extra fluid on her legs.  Reviewing records has a both CHF and COPD.  Went to urgent care and found to have room air sats at 78%.  Started on oxygen and feels better.  Not on oxygen at home. Patient states she is unvaccinated for Covid.  However reviewing records appears that she has been vaccinated.      Past Medical History:  Diagnosis Date  . Back pain    DDD  . Carotid artery occlusion   . Cough   . Diastolic heart failure ([place])   . Dry skin   . Essential hypertension   . History of bruising easily   . History of colon polyps   . History of migraine   . History of pneumonia   . Hyperlipidemia   . PAD (peripheral artery disease) ([place])   . Panic attack   . Rheumatoid arthritis(714.0)     Patient Active Problem List   Diagnosis Date Noted  . Cough   . Oropharyngeal dysphagia   . Vitamin D deficiency disease 06/18/2019  . CHF (congestive heart failure) ([place]) 03/19/2015  . Hypertension 03/19/2015  . Peripheral vascular disease ([place]) 03/19/2015  . Congestive heart failure (CHF) ([place]) 03/19/2015  . Aftercare following surgery of the circulatory system, [place] 05/14/2012  . Carotid artery stenosis 03/12/2012  . Non compliance w medication regimen secondary to cost 10/04/2011  . Arthritis, rheumatoid ([place]) 10/04/2011  . Dizziness 10/03/2011  . PAD,  high grade RICA by MRI 10/03/11 10/03/2011  . Tobacco abuse 10/03/2011  . LVH (left ventricular hypertrophy) 10/03/2011  . Renal artery stenosis: left 50% 2005 10/03/2011  . HLD (hyperlipidemia) 11/21/2007    Past Surgical History:  Procedure Laterality Date  . ABDOMINAL HYSTERECTOMY    . ANGIOPLASTY  04/20/2012   Procedure: ANGIOPLASTY;  Surgeon: Serafina Mitchell, MD;  Location: [facility] OR;  Service: Vascular;  Laterality: Right;  Using 1cm x6 cm vascu-guard patch   . BACK SURGERY    . CARDIAC CATHETERIZATION  01/31/2005  . CAROTID ANGIOGRAM N/A 01/06/2012   Procedure: CAROTID ANGIOGRAM;  Surgeon: Lorretta Harp, MD;  Location: [facility] CATH LAB;  Service: Cardiovascular;  Laterality: N/A;  . CARPAL TUNNEL RELEASE    . CHOLECYSTECTOMY    . COLONOSCOPY    . CORONARY ANGIOPLASTY  01/06/12/2005/2006    2 stents  . EGD/TCS  03/2004   Dr. Gala Romney: Tiny distal esophageal erosions, prepyloric antral erosions/ulcerations, internal hemorrhoids, diminutive polyp in the rectosigmoid junction removed (hyperplastic).  Pathology not available but reportedly she had H. pylori which was treated.  Marland Kitchen ENDARTERECTOMY  04/20/2012   Procedure: ENDARTERECTOMY CAROTID;  Surgeon: Serafina Mitchell, MD;  Location: [place];  Service: Vascular;  Laterality: Right;  . ESOPHAGOGASTRODUODENOSCOPY  07/2004   Dr. Gala Romney: Previous antral  ulcer was completely healed.  EGD normal.  . GIVENS CAPSULE STUDY     ???  . leg stents    . LOWER EXTREMITY ANGIOGRAM N/A 01/06/2012   Procedure: LOWER EXTREMITY ANGIOGRAM;  Surgeon: Lorretta Harp, MD;  Location: [facility] CATH LAB;  Service: Cardiovascular;  Laterality: N/A;  . TOTAL KNEE ARTHROPLASTY    . TRIGGER FINGER RELEASE    . ulnar nerve surgery       OB History    Gravida      Para      Term      Preterm      AB      Living  2     SAB      IAB      Ectopic      Multiple      Live Births              Family History  Problem Relation Age of Onset  .  Hyperlipidemia Mother   . Hypertension Mother   . Cancer Father        Brain  . Diabetes Sister   . Hypertension Sister   . Diabetes Brother   . Heart disease Brother   . Hypertension Brother   . Heart attack Brother   . Colon cancer Neg Hx     Social History   Tobacco Use  . Smoking status: Current Every Day Smoker    Packs/day: 0.50    Years: 50.00    Pack years: 25.00    Types: Cigarettes    Start date: 14  . Smokeless tobacco: Never Used  . Tobacco comment: pt states that she uses nic patches  Vaping Use  . Vaping Use: Never used  Substance Use Topics  . Alcohol use: Not Currently    Comment: occassionally  . Drug use: No    Home Medications Prior to Admission medications   Medication Sig Start Date End Date Taking? Authorizing Provider  albuterol (VENTOLIN HFA) 108 (90 Base) MCG/ACT inhaler INHALE 2 INHALATIONS INTO THE LUNGS EVERY 6 HOURS AS NEEDED FOR WHEEZING 09/17/19   Hurshel Party C, MD  aspirin EC 81 MG tablet Take 81 mg by mouth daily.    [provider]  celecoxib (CELEBREX) 100 MG capsule  12/13/19   [provider]  Cholecalciferol (VITAMIN D-3) 125 MCG (5000 UT) TABS Take 2 tablets by mouth daily. Patient not taking: Reported on 01/28/2020    [provider]  cilostazol (PLETAL) 100 MG tablet TAKE 1 TABLET(100 MG) BY MOUTH TWICE DAILY 08/06/19   Anastasio Champion, Nimish C, MD  furosemide (LASIX) 20 MG tablet TAKE 1 TABLET(20 MG) BY MOUTH DAILY AS NEEDED FOR SWELLING Patient not taking: Reported on 01/28/2020 09/24/19   Ailene Ards, NP  hydroquinone 4 % cream Apply topically 2 (two) times daily. 08/05/19   Clark-Burning, Anderson Malta, PA-C  IBU 800 MG tablet TAKE 1 TABLET (800 MG TOTAL) BY MOUTH EVERY 8 (EIGHT) HOURS AS NEEDED. 01/27/20   Hurshel Party C, MD  losartan (COZAAR) 50 MG tablet Take 1 tablet (50 mg total) by mouth 2 (two) times daily. 10/08/19   Doree Albee, MD  metoprolol succinate (TOPROL XL) 25 MG 24 hr tablet Take 1  tablet (25 mg total) by mouth daily. 10/23/19   Satira Sark, MD  pantoprazole (PROTONIX) 40 MG tablet TAKE 1 TABLET EVERY DAY 12/28/19   Hurshel Party C, MD  potassium chloride (KLOR-CON) 10 MEQ tablet TAKE 1 TABLET  BY MOUTH EVERY DAY WHEN YOU TAKE FUROSEMIDE(LASIX) Patient not taking: Reported on 01/28/2020 09/24/19   Ailene Ards, NP  rosuvastatin (CRESTOR) 10 MG tablet TAKE 1 TABLET EVERY DAY 12/28/19   Hurshel Party C, MD  cilostazol (PLETAL) 100 MG tablet Take 1 tablet (100 mg total) by mouth 2 (two) times daily. 09/09/13   Serafina Mitchell, MD    Allergies    Latex, Tape, and Atorvastatin  Review of Systems   Review of Systems  Constitutional: Negative for appetite change, fatigue and fever.  HENT: Negative for congestion.   Respiratory: Positive for cough and shortness of breath.   Cardiovascular: Positive for leg swelling.  Gastrointestinal: Positive for abdominal pain.  Musculoskeletal: Negative for back pain.  Skin: Negative for pallor.  Neurological: Negative for weakness.  Psychiatric/Behavioral: Negative for confusion.    Physical Exam Updated Vital Signs BP (!) 202/101   Pulse 96   Temp 98.5 F (36.9 C) (Oral)   Resp (!) 21   Ht 5\' 7"  (1.702 m)   Wt 78.5 kg   SpO2 100%   BMI 27.10 kg/m   Physical Exam Vitals and nursing note reviewed.  HENT:     Head: Normocephalic.  Cardiovascular:     Rate and Rhythm: Regular rhythm. Tachycardia present.  Pulmonary:     Comments: Mildly harsh breath sounds.  On nasal cannula oxygen.  No focal rales or rhonchi. Chest:     Chest wall: No tenderness.  Abdominal:     Comments: Mild epigastric tenderness without rebound or guarding.  Musculoskeletal:     Cervical back: Neck supple.     Right lower leg: Edema present.     Left lower leg: Edema present.     Comments: Moderate pitting edema bilateral lower extremities.  Skin:    Capillary Refill: Capillary refill takes less than 2 seconds.  Neurological:     Mental  Status: She is alert and oriented to person, place, and time.  Psychiatric:        Mood and Affect: Mood normal.     ED Results / Procedures / Treatments   Labs (all labs ordered are listed, but only abnormal results are displayed) Labs Reviewed  BRAIN NATRIURETIC PEPTIDE - Abnormal; Notable for the following components:      Result Value   B Natriuretic Peptide 985.0 (*)    All other components within normal limits  COMPREHENSIVE METABOLIC PANEL - Abnormal; Notable for the following components:   Potassium 3.4 (*)    Chloride 95 (*)    Glucose, Bld 157 (*)    Calcium 8.7 (*)    Total Bilirubin 1.5 (*)    All other components within normal limits  TROPONIN I (HIGH SENSITIVITY) - Abnormal; Notable for the following components:   Troponin I (High Sensitivity) 78 (*)    All other components within normal limits  RESP PANEL BY RT-PCR (FLU A&B, COVID) ARPGX2  CBC WITH DIFFERENTIAL/PLATELET    EKG EKG Interpretation  Date/Time:  Saturday March 07 2020 11:21:25 EST Ventricular Rate:  106 PR Interval:    QRS Duration: 98 QT Interval:  339 QTC Calculation: 451 R Axis:   83 Text Interpretation: Sinus tachycardia LAE, consider biatrial enlargement Anterior infarct, old Repol abnrm suggests ischemia, lateral leads No significant change since last tracing Confirmed by Davonna Belling [phone]) on 03/07/2020 11:39:06 AM   Radiology DG Chest Portable 1 View  Result Date: 03/07/2020 CLINICAL DATA:  Shortness of breath.  Pending COVID test. EXAM: PORTABLE  CHEST 1 VIEW COMPARISON:  March 19, 2015 FINDINGS: No pneumothorax. There is a small right pleural effusion with underlying atelectasis. There may be a tiny left effusion as well. Cardiomegaly. The hila and mediastinum are unchanged. No pulmonary nodules, masses, or focal infiltrates. IMPRESSION: 1. Small bilateral pleural effusions with underlying atelectasis, right greater than left. No other pulmonary opacity. No overt edema.  Mild pulmonary venous congestion not excluded. Electronically Signed   By: Dorise Bullion III M.D   On: 03/07/2020 11:53    Procedures Procedures (including critical care time)  Medications Ordered in ED Medications  losartan (COZAAR) tablet 50 mg (50 mg Oral Given 03/07/20 1228)  furosemide (LASIX) injection 40 mg (40 mg Intravenous Given 03/07/20 1228)    ED Course  I have reviewed the triage vital signs and the nursing notes.  Pertinent labs & imaging results that were available during my care of the patient were reviewed by me and considered in my medical decision making (see chart for details).    MDM Rules/Calculators/A&P                          Patient presents with shortness of breath.  Has had since yesterday.  Is a smoker and has history of COPD and CHF.  Patient does appear volume overloaded.  Has a fair amount of edema on both her legs.  X-ray shows potentially some mild edema and BNP is elevated.  Although may also have component of COPD.  Had not had her medicines this morning and ran out of had and last night.  Troponin is elevated but I think it is secondary to CHF as opposed to an acute ischemic heart disease.  IV Lasix given.  Given her home dose of losartan but may require further medicines to help get the blood pressure down.  However has new oxygen requirement.  Will admit to hospitalist. Negative Covid test.  CRITICAL CARE Performed by: Davonna Belling Total critical care time: 30 minutes Critical care time was exclusive of separately billable procedures and treating other patients. Critical care was necessary to treat or prevent imminent or life-threatening deterioration. Critical care was time spent personally by me on the following activities: development of treatment plan with patient and/or surrogate as well as nursing, discussions with consultants, evaluation of patient's response to treatment, examination of patient, obtaining history from patient or  surrogate, ordering and performing treatments and interventions, ordering and review of laboratory studies, ordering and review of radiographic studies, pulse oximetry and re-evaluation of patient's condition. Final Clinical Impression(s) / ED Diagnoses Final diagnoses:  Acute on chronic congestive heart failure, unspecified heart failure type [facility])    Rx / DC Orders ED Discharge Orders    None       Davonna Belling, MD 03/07/20 1234

## 2020-03-07 NOTE — Progress Notes (Signed)
Report called to [employer]

## 2020-03-07 NOTE — H&P (Addendum)
History and Physical    HARLAN VINAL LEX:[id number] DOB: 1945-04-28 DOA: 03/07/2020  PCP: Doree Albee, MD   Patient coming from: Urgent care  Chief Complaint: Worsening shortness of breath/cough  HPI: Leslie [name] is a 74 y.o. female with medical history significant for hypertension, dyslipidemia, COPD with ongoing tobacco use, PAD/carotid artery disease, and hypertrophic cardiomyopathy with established chronic diastolic CHF who presented to the emergency department with worsening shortness of breath that began last night.  She was also noted to have trouble with laying flat as she would become more short of breath and she is also noted to have some swelling to her legs.  She also endorses a cough without much sputum production.  She initially went to the urgent care and was noted to have room air sats of approximately 78% and therefore was started on nasal cannula oxygen and brought to the ED for further evaluation.  She states that she does not routinely monitor her blood pressure readings and knows that it usually is elevated.  She tries to avoid sodium intake, but knows that she is getting this through some of the processed foods that she eats.   ED Course: Patient noted to have elevated blood pressure readings of 449 systolic and 675 diastolic.  BNP is 95 and initial troponin elevated at 78.  EKG with sinus tachycardia and no acute findings noted.  Covid testing negative.  Chest x-ray with small bilateral pleural effusions and mild congestion noted.  She is currently without respiratory distress on 2 L nasal cannula oxygen.  Noted to have mild hypokalemia of 3.4 lab work.  Review of Systems: Reviewed as noted above, otherwise negative.  Past Medical History:  Diagnosis Date  . Back pain    DDD  . Carotid artery occlusion   . Cough   . Diastolic heart failure ([place])   . Dry skin   . Essential hypertension   . History of bruising easily   . History of colon polyps   . History of  migraine   . History of pneumonia   . Hyperlipidemia   . PAD (peripheral artery disease) ([place])   . Panic attack   . Rheumatoid arthritis(714.0)     Past Surgical History:  Procedure Laterality Date  . ABDOMINAL HYSTERECTOMY    . ANGIOPLASTY  04/20/2012   Procedure: ANGIOPLASTY;  Surgeon: Serafina Mitchell, MD;  Location: [facility] OR;  Service: Vascular;  Laterality: Right;  Using 1cm x6 cm vascu-guard patch   . BACK SURGERY    . CARDIAC CATHETERIZATION  01/31/2005  . CAROTID ANGIOGRAM N/A 01/06/2012   Procedure: CAROTID ANGIOGRAM;  Surgeon: Lorretta Harp, MD;  Location: [facility] CATH LAB;  Service: Cardiovascular;  Laterality: N/A;  . CARPAL TUNNEL RELEASE    . CHOLECYSTECTOMY    . COLONOSCOPY    . CORONARY ANGIOPLASTY  01/06/12/2005/2006    2 stents  . EGD/TCS  03/2004   Dr. Gala Romney: Tiny distal esophageal erosions, prepyloric antral erosions/ulcerations, internal hemorrhoids, diminutive polyp in the rectosigmoid junction removed (hyperplastic).  Pathology not available but reportedly she had H. pylori which was treated.  Marland Kitchen ENDARTERECTOMY  04/20/2012   Procedure: ENDARTERECTOMY CAROTID;  Surgeon: Serafina Mitchell, MD;  Location: [place];  Service: Vascular;  Laterality: Right;  . ESOPHAGOGASTRODUODENOSCOPY  07/2004   Dr. Gala Romney: Previous antral ulcer was completely healed.  EGD normal.  . GIVENS CAPSULE STUDY     ???  . leg stents    . LOWER EXTREMITY ANGIOGRAM  N/A 01/06/2012   Procedure: LOWER EXTREMITY ANGIOGRAM;  Surgeon: Lorretta Harp, MD;  Location: [facility] CATH LAB;  Service: Cardiovascular;  Laterality: N/A;  . TOTAL KNEE ARTHROPLASTY    . TRIGGER FINGER RELEASE    . ulnar nerve surgery       reports that she has been smoking cigarettes. She started smoking about 61 years ago. She has a 25.00 pack-year smoking history. She has never used smokeless tobacco. She reports previous alcohol use. She reports that she does not use drugs.  Allergies  Allergen Reactions  . Latex Other (See  Comments)    Burns skin   . Tape Other (See Comments) and Itching    Burns skin   . Atorvastatin Other (See Comments)    myalgia myalgia    Family History  Problem Relation Age of Onset  . Hyperlipidemia Mother   . Hypertension Mother   . Cancer Father        Brain  . Diabetes Sister   . Hypertension Sister   . Diabetes Brother   . Heart disease Brother   . Hypertension Brother   . Heart attack Brother   . Colon cancer Neg Hx     Prior to Admission medications   Medication Sig Start Date End Date Taking? Authorizing Provider  albuterol (VENTOLIN HFA) 108 (90 Base) MCG/ACT inhaler INHALE 2 INHALATIONS INTO THE LUNGS EVERY 6 HOURS AS NEEDED FOR WHEEZING 09/17/19   Hurshel Party C, MD  aspirin EC 81 MG tablet Take 81 mg by mouth daily.    [provider]  celecoxib (CELEBREX) 100 MG capsule  12/13/19   [provider]  Cholecalciferol (VITAMIN D-3) 125 MCG (5000 UT) TABS Take 2 tablets by mouth daily. Patient not taking: Reported on 01/28/2020    [provider]  cilostazol (PLETAL) 100 MG tablet TAKE 1 TABLET(100 MG) BY MOUTH TWICE DAILY 08/06/19   Anastasio Champion, Nimish C, MD  furosemide (LASIX) 20 MG tablet TAKE 1 TABLET(20 MG) BY MOUTH DAILY AS NEEDED FOR SWELLING Patient not taking: Reported on 01/28/2020 09/24/19   Ailene Ards, NP  hydroquinone 4 % cream Apply topically 2 (two) times daily. 08/05/19   Clark-Burning, Anderson Malta, PA-C  IBU 800 MG tablet TAKE 1 TABLET (800 MG TOTAL) BY MOUTH EVERY 8 (EIGHT) HOURS AS NEEDED. 01/27/20   Hurshel Party C, MD  losartan (COZAAR) 50 MG tablet Take 1 tablet (50 mg total) by mouth 2 (two) times daily. 10/08/19   Doree Albee, MD  metoprolol succinate (TOPROL XL) 25 MG 24 hr tablet Take 1 tablet (25 mg total) by mouth daily. 10/23/19   Satira Sark, MD  pantoprazole (PROTONIX) 40 MG tablet TAKE 1 TABLET EVERY DAY 12/28/19   Hurshel Party C, MD  potassium chloride (KLOR-CON) 10 MEQ tablet TAKE 1 TABLET BY MOUTH  EVERY DAY WHEN YOU TAKE FUROSEMIDE(LASIX) Patient not taking: Reported on 01/28/2020 09/24/19   Ailene Ards, NP  rosuvastatin (CRESTOR) 10 MG tablet TAKE 1 TABLET EVERY DAY 12/28/19   Hurshel Party C, MD  cilostazol (PLETAL) 100 MG tablet Take 1 tablet (100 mg total) by mouth 2 (two) times daily. 09/09/13   Serafina Mitchell, MD    Physical Exam: Vitals:   03/07/20 1115 03/07/20 1130 03/07/20 1145 03/07/20 1215  BP: (!) 158/115 (!) 205/109 (!) 207/116 (!) 202/101  Pulse: (!) 102 97 94 96  Resp: 18 (!) 25 (!) 21 (!) 21  Temp:      TempSrc:  SpO2: 100% 99% 98% 100%  Weight:      Height:        Constitutional: NAD, calm, comfortable Vitals:   03/07/20 1115 03/07/20 1130 03/07/20 1145 03/07/20 1215  BP: (!) 158/115 (!) 205/109 (!) 207/116 (!) 202/101  Pulse: (!) 102 97 94 96  Resp: 18 (!) 25 (!) 21 (!) 21  Temp:      TempSrc:      SpO2: 100% 99% 98% 100%  Weight:      Height:       Eyes: lids and conjunctivae normal Neck: normal, supple Respiratory: clear to auscultation bilaterally. Normal respiratory effort. No accessory muscle use.  Currently on 2 L nasal cannula oxygen. Cardiovascular: Regular rate and rhythm, no murmurs. Abdomen: no tenderness, no distention. Bowel sounds positive.  Musculoskeletal:  No edema. Skin: no rashes, lesions, ulcers.  Psychiatric: Flat affect  Labs on Admission: I have personally reviewed following labs and imaging studies  CBC: Recent Labs  Lab 03/07/20 1115  WBC 5.9  NEUTROABS 3.9  HGB 12.8  HCT 42.5  MCV 89.1  PLT 938   Basic Metabolic Panel: Recent Labs  Lab 03/07/20 1115  NA 135  K 3.4*  CL 95*  CO2 29  GLUCOSE 157*  BUN 11  CREATININE 0.98  CALCIUM 8.7*   GFR: Estimated Creatinine Clearance: 55.2 mL/min (by C-G formula based on SCr of 0.98 mg/dL). Liver Function Tests: Recent Labs  Lab 03/07/20 1115  AST 27  ALT 24  ALKPHOS 59  BILITOT 1.5*  PROT 7.4  ALBUMIN 3.6   No results for input(s): LIPASE,  AMYLASE in the last 168 hours. No results for input(s): AMMONIA in the last 168 hours. Coagulation Profile: No results for input(s): INR, PROTIME in the last 168 hours. Cardiac Enzymes: No results for input(s): CKTOTAL, CKMB, CKMBINDEX, TROPONINI in the last 168 hours. BNP (last 3 results) No results for input(s): PROBNP in the last 8760 hours. HbA1C: No results for input(s): HGBA1C in the last 72 hours. CBG: No results for input(s): GLUCAP in the last 168 hours. Lipid Profile: No results for input(s): CHOL, HDL, LDLCALC, TRIG, CHOLHDL, LDLDIRECT in the last 72 hours. Thyroid Function Tests: No results for input(s): TSH, T4TOTAL, FREET4, T3FREE, THYROIDAB in the last 72 hours. Anemia Panel: No results for input(s): VITAMINB12, FOLATE, FERRITIN, TIBC, IRON, RETICCTPCT in the last 72 hours. Urine analysis:    Component Value Date/Time   COLORURINE STRAW (A) 09/18/2019 2146   APPEARANCEUR HAZY (A) 09/18/2019 2146   LABSPEC 1.004 (L) 09/18/2019 2146   PHURINE 7.0 09/18/2019 2146   GLUCOSEU NEGATIVE 09/18/2019 2146   HGBUR SMALL (A) 09/18/2019 2146   BILIRUBINUR NEGATIVE 09/18/2019 2146   KETONESUR NEGATIVE 09/18/2019 2146   PROTEINUR NEGATIVE 09/18/2019 2146   UROBILINOGEN 0.2 04/16/2012 1525   NITRITE NEGATIVE 09/18/2019 2146   LEUKOCYTESUR NEGATIVE 09/18/2019 2146    Radiological Exams on Admission: DG Chest Portable 1 View  Result Date: 03/07/2020 CLINICAL DATA:  Shortness of breath.  Pending COVID test. EXAM: PORTABLE CHEST 1 VIEW COMPARISON:  March 19, 2015 FINDINGS: No pneumothorax. There is a small right pleural effusion with underlying atelectasis. There may be a tiny left effusion as well. Cardiomegaly. The hila and mediastinum are unchanged. No pulmonary nodules, masses, or focal infiltrates. IMPRESSION: 1. Small bilateral pleural effusions with underlying atelectasis, right greater than left. No other pulmonary opacity. No overt edema. Mild pulmonary venous congestion  not excluded. Electronically Signed   By: Dorise Bullion III M.D  On: 03/07/2020 11:53    EKG: Independently reviewed. ST 106bpm with signs of prior infarct, no new changes.  Assessment/Plan Active Problems:   Acute CHF (HCC)    Acute on chronic diastolic CHF exacerbation -Prior 2D echocardiogram 02/2019 with LVEF 65 to 70% and grade 1 diastolic dysfunction noted along with severe LVH -Noted small bilateral pleural effusions on chest x-ray with mild congestion and BNP 985 -Repeat 2D echocardiogram -Continue on IV Lasix twice daily  Hypertensive crisis -In the setting of likely uncontrolled hypertension -Plan to monitor in stepdown unit and start on IV hydralazine pushes as needed -Resume home blood pressure medications -IV diuresis with Lasix as noted above -Plan to start on nitroglycerin drip if blood pressures are not beginning to improve  Elevated troponin secondary to above causes -No signs of ACS currently noted -Plan to trend troponin levels and monitor closely -Repeat 2D echocardiogram as noted above -Monitor on telemetry  Mild hypokalemia -Replete and reevaluate in a.m. -Monitor closely with ongoing diuresis  History of COPD with ongoing tobacco abuse -No active wheezing currently noted -Plan to use DuoNebs as needed for shortness of breath or wheezing -Counseled on cessation of tobacco use  History of PAD/carotid artery disease -Continue on aspirin and statin -Continue Pletal  History of dyslipidemia -Continue statin  DVT prophylaxis: Declines DVT prophylaxis Code Status: Full Family Communication: Discussed with daughter on phone Disposition Plan: Admit for treatment of hypertension and CHF Consults called: None Admission status: Inpatient  Severity of Illness: The appropriate patient status for this patient is INPATIENT. Inpatient status is judged to be reasonable and necessary in order to provide the required intensity of service to ensure the  patient's safety. The patient's presenting symptoms, physical exam findings, and initial radiographic and laboratory data in the context of their chronic comorbidities is felt to place them at high risk for further clinical deterioration. Furthermore, it is not anticipated that the patient will be medically stable for discharge from the hospital within 2 midnights of admission. The following factors support the patient status of inpatient.   " The patient's presenting symptoms include Shortness of breath and hypoxemia. " The worrisome physical exam findings include volume overload and high blood pressure. " The initial radiographic and laboratory data are worrisome because of volume overload " The chronic co-morbidities include CHF.   * I certify that at the point of admission it is my clinical judgment that the patient will require inpatient hospital care spanning beyond 2 midnights from the point of admission due to high intensity of service, high risk for further deterioration and high frequency of surveillance required.*    Kevona Lupinacci D Keyari Kleeman DO [facility]  If 7PM-7AM, please contact night-coverage [email or web address]  03/07/2020, 12:47 PM

## 2020-03-07 NOTE — ED Notes (Signed)
Pt placed on 2 l o2 via [place], sats went up to 92%.

## 2020-03-07 NOTE — ED Triage Notes (Signed)
Pt c/o shortness of breath since yesterday. Pt is a smoker.Denies covid exposure

## 2020-03-07 NOTE — ED Triage Notes (Signed)
Shortness of breath with activity that started yesterday.  Pt reports she could hardly sleep last night from being short of breath.

## 2020-03-08 ENCOUNTER — Inpatient Hospital Stay (HOSPITAL_COMMUNITY): Payer: Medicare HMO

## 2020-03-08 DIAGNOSIS — I5031 Acute diastolic (congestive) heart failure: Secondary | ICD-10-CM

## 2020-03-08 DIAGNOSIS — I5033 Acute on chronic diastolic (congestive) heart failure: Secondary | ICD-10-CM

## 2020-03-08 DIAGNOSIS — J449 Chronic obstructive pulmonary disease, unspecified: Secondary | ICD-10-CM | POA: Diagnosis present

## 2020-03-08 DIAGNOSIS — I5030 Unspecified diastolic (congestive) heart failure: Secondary | ICD-10-CM

## 2020-03-08 DIAGNOSIS — Z9114 Patient's other noncompliance with medication regimen: Secondary | ICD-10-CM

## 2020-03-08 LAB — BASIC METABOLIC PANEL
Anion gap: 9 (ref 5–15)
BUN: 12 mg/dL (ref 8–23)
CO2: 35 mmol/L — ABNORMAL HIGH (ref 22–32)
Calcium: 8.6 mg/dL — ABNORMAL LOW (ref 8.9–10.3)
Chloride: 95 mmol/L — ABNORMAL LOW (ref 98–111)
Creatinine, Ser: 0.93 mg/dL (ref 0.44–1.00)
GFR, Estimated: >60 mL/min (ref >60)
Glucose, Bld: 108 mg/dL — ABNORMAL HIGH (ref 70–99)
Potassium: 3.9 mmol/L (ref 3.5–5.1)
Sodium: 139 mmol/L (ref 135–145)

## 2020-03-08 LAB — MRSA PCR SCREENING: MRSA by PCR: NEGATIVE

## 2020-03-08 LAB — ECHOCARDIOGRAM COMPLETE
Area-P 1/2: 2.5 cm2
Height: 67 in
S' Lateral: 1.67 cm
Weight: 2627.88 oz

## 2020-03-08 LAB — CBC
HCT: 42.3 % (ref 36.0–46.0)
Hemoglobin: 12.8 g/dL (ref 12.0–15.0)
MCH: 26.8 pg (ref 26.0–34.0)
MCHC: 30.3 g/dL (ref 30.0–36.0)
MCV: 88.7 fL (ref 80.0–100.0)
Platelets: 247 10*3/uL (ref 150–400)
RBC: 4.77 MIL/uL (ref 3.87–5.11)
RDW: 14.7 % (ref 11.5–15.5)
WBC: 5 10*3/uL (ref 4.0–10.5)
nRBC: 0 % (ref 0.0–0.2)

## 2020-03-08 LAB — MAGNESIUM: Magnesium: 1.1 mg/dL — ABNORMAL LOW (ref 1.7–2.4)

## 2020-03-08 MED ORDER — PREDNISONE 20 MG PO TABS
50.0000 mg | ORAL_TABLET | Freq: Every day | ORAL | Status: DC
Start: 2020-03-08 — End: 2020-03-09
  Administered 2020-03-08 – 2020-03-09 (×2): 50 mg via ORAL
  Filled 2020-03-08 (×2): qty 3

## 2020-03-08 MED ORDER — VITAMIN D 25 MCG (1000 UNIT) PO TABS
10000.0000 [IU] | ORAL_TABLET | Freq: Every day | ORAL | Status: DC
  Administered 2020-03-09: 08:00:00 10000 [IU] via ORAL
  Filled 2020-03-08 (×2): qty 10

## 2020-03-08 MED ORDER — FUROSEMIDE 10 MG/ML IJ SOLN
40.0000 mg | Freq: Every day | INTRAMUSCULAR | Status: DC
  Administered 2020-03-09: 08:00:00 40 mg via INTRAVENOUS
  Filled 2020-03-08: qty 4

## 2020-03-08 MED ORDER — ALBUTEROL SULFATE (2.5 MG/3ML) 0.083% IN NEBU: 2.5000 mg | INHALATION_SOLUTION | RESPIRATORY_TRACT | Status: DC | PRN

## 2020-03-08 MED ORDER — IPRATROPIUM-ALBUTEROL 0.5-2.5 (3) MG/3ML IN SOLN
3.0000 mL | Freq: Four times a day (QID) | RESPIRATORY_TRACT | Status: DC
  Administered 2020-03-08 – 2020-03-09 (×2): 3 mL via RESPIRATORY_TRACT
  Filled 2020-03-08 (×2): qty 3

## 2020-03-08 MED ORDER — MAGNESIUM SULFATE 4 GM/100ML IV SOLN
4.0000 g | Freq: Once | INTRAVENOUS | Status: AC
  Administered 2020-03-08: 08:00:00 4 g via INTRAVENOUS
  Filled 2020-03-08: qty 100

## 2020-03-08 MED ORDER — ROSUVASTATIN CALCIUM 10 MG PO TABS
10.0000 mg | ORAL_TABLET | Freq: Every day | ORAL | Status: DC
  Administered 2020-03-08 – 2020-03-09 (×2): 10 mg via ORAL
  Filled 2020-03-08 (×2): qty 1

## 2020-03-08 MED ORDER — NICOTINE 14 MG/24HR TD PT24
14.0000 mg | MEDICATED_PATCH | Freq: Every day | TRANSDERMAL | Status: DC
  Administered 2020-03-08 – 2020-03-09 (×2): 14 mg via TRANSDERMAL
  Filled 2020-03-08 (×2): qty 1

## 2020-03-08 NOTE — Progress Notes (Signed)
Patient Demographics:    Leslie [name], is a 74 y.o. female, DOB - 11-Aug-1945, NUU:[id number]  Admit date - 03/07/2020   Admitting Physician Leslie Darleen Crocker, DO  Outpatient Primary MD for the patient is Leslie Albee, MD  LOS - 1   Chief Complaint  Patient presents with  . Shortness of Breath        Subjective:    Leslie [name] today has no fevers, no emesis,  No chest pain, Shortness of breath at rest is much better, dyspnea on exertion improving, -Patient states she voided a lot, but she voided directly into the toilet bowl, not measured  Assessment  & Plan :    Principal Problem:   Acute CHF ([place]) Active Problems:   Hypertension   COPD (chronic obstructive pulmonary disease) (HCC)   PAD, high grade RICA by MRI 10/03/11   Tobacco abuse   Renal artery stenosis: left 50% 2005   Non compliance w medication regimen secondary to cost   Arthritis, rheumatoid (HCC)   CHF (congestive heart failure) ([place])   Brief Summary:- 74 y.o. female with medical history significant for hypertension, dyslipidemia, COPD with ongoing tobacco use, PAD/carotid artery disease, and hypertrophic cardiomyopathy with established chronic diastolic CHF tendon 34/74/2595 with worsening shortness of breath and acute hypoxic respiratory failure most likely due to combination of CHF and COPD exacerbation   A/p 1) acute on chronic diastolic dysfunction/exacerbation--- respiratory status and hypoxia improved after IV Lasix,-Patient states she voided a lot, but she voided directly into the toilet bowl, not measured -Inaccurate fluid input/output -Change IV Lasix to 40 mg daily -Repeat chest x-ray in a.m. -EF as per echocardiogram from 1 year ago in December 2020 was 52 to 70% with severe LVH and grade 1 diastolic dysfunction -Repeat echocardiogram pending today - 2) COPD/Tobacco Abuse --- prednisone on bronchodilators/ordered,  smoking cessation advised, nicotine patch is ordered  3) acute hypoxic respiratory failure--- secondary to #1 and 2 above, resolving -May use supplemental oxygen as needed -  4)HTN--presented with hypertensive crisis BP control is improved -Continue Toprol-XL 25 mg daily, losartan 50 mg twice daily,, may use IV hydralazine as needed elevated BP  5)PAD/carotid artery stenosis--- sadly continues to smoke -The need to be compliant with Pletal , crestor and aspirin emphasized   Disposition/Need for in-Hospital Stay- patient unable to be discharged at this time due to --acute hypoxic respiratory failure secondary to COPD and CHF requiring IV diuresis, bronchodilators and supplemental oxygen  Status is: Inpatient  Remains inpatient appropriate because:See above   Disposition: The patient is from: Home              Anticipated d/c is to: Home              Anticipated d/c date is: 1 day              Patient currently is not medically stable to d/c. Barriers: Not Clinically Stable-   Code Status :  -  Code Status: Full Code   Family Communication:    NA (patient is alert, awake and coherent)   Consults  :  na  DVT Prophylaxis  :   - SCDs       Lab Results  Component Value Date  PLT 247 03/08/2020    Inpatient Medications  Scheduled Meds: . aspirin EC  81 mg Oral Daily  . Chlorhexidine Gluconate Cloth  6 each Topical Daily  . [START ON 03/09/2020] cholecalciferol  10,000 Units Oral Daily  . cilostazol  100 mg Oral BID  . furosemide  40 mg Intravenous Q12H  . losartan  50 mg Oral BID  . metoprolol succinate  25 mg Oral Daily  . pantoprazole  40 mg Oral Daily  . potassium chloride  40 mEq Oral BID  . predniSONE  50 mg Oral Q breakfast  . sodium chloride flush  3 mL Intravenous Q12H   Continuous Infusions: . sodium chloride     PRN Meds:.sodium chloride, acetaminophen **OR** acetaminophen, hydrALAZINE, ipratropium-albuterol, ondansetron **OR** ondansetron (ZOFRAN) IV,  sodium chloride flush    Anti-infectives (From admission, onward)   None        Objective:   Vitals:   03/08/20 0203 03/08/20 0613 03/08/20 0828 03/08/20 1419  BP: 128/88 102/62 125/73 134/67  Pulse: 89 91 93 83  Resp: 18 20    Temp: 98.1 F (36.7 C) 98.3 F (36.8 C)  98.2 F (36.8 C)  TempSrc: Oral Oral  Oral  SpO2: 99% 97%  98%  Weight:  74.5 kg    Height:        Wt Readings from Last 3 Encounters:  03/08/20 74.5 kg  03/07/20 78.5 kg  01/28/20 79.4 kg     Intake/Output Summary (Last 24 hours) at 03/08/2020 1610 Last data filed at 03/08/2020 1300 Gross per 24 hour  Intake 600 ml  Output 350 ml  Net 250 ml   Physical Exam  Gen:- Awake Alert, dyspnea on exertion, no shortness of breath at rest HEENT:- Leslie [name].AT, No sclera icterus Neck-Supple Neck,No JVD,.  Lungs-diminished breath sounds, faint bibasilar rales, CV- S1, S2 normal, regular  Abd-  +ve B.Sounds, Abd Soft, No tenderness,    Extremity/Skin:-  pedal pulses present , warm and dry skin Psych-affect is appropriate, oriented x3 Neuro-no new focal deficits, no tremors   Data Review:   Micro Results Recent Results (from the past 240 hour(s))  Resp Panel by RT-PCR (Flu A&B, Covid) Nasopharyngeal Swab     Status: None   Collection Time: 03/07/20 11:16 AM   Specimen: Nasopharyngeal Swab; Nasopharyngeal(NP) swabs in vial transport medium  Result Value Ref Range Status   SARS Coronavirus 2 by RT PCR NEGATIVE NEGATIVE Final    Comment: (NOTE) SARS-CoV-2 target nucleic acids are NOT DETECTED.  The SARS-CoV-2 RNA is generally detectable in upper respiratory specimens during the acute phase of infection. The lowest concentration of SARS-CoV-2 viral copies this assay can detect is 138 copies/mL. A negative result does not preclude SARS-Cov-2 infection and should not be used as the sole basis for treatment or other patient management decisions. A negative result may occur with  improper specimen  collection/handling, submission of specimen other than nasopharyngeal swab, presence of viral mutation(s) within the areas targeted by this assay, and inadequate number of viral copies(<138 copies/mL). A negative result must be combined with clinical observations, patient history, and epidemiological information. The expected result is Negative.  Fact Sheet for Patients:  EntrepreneurPulse.com.au  Fact Sheet for Healthcare Providers:  IncredibleEmployment.be  This test is no t yet approved or cleared by the [place] FDA and  has been authorized for detection and/or diagnosis of SARS-CoV-2 by FDA under an Emergency Use Authorization (EUA). This EUA will remain  in effect (meaning this test can be  used) for the duration of the COVID-19 declaration under Section 564(b)(1) of the Act, 21 U.S.C.section 360bbb-3(b)(1), unless the authorization is terminated  or revoked sooner.       Influenza A by PCR NEGATIVE NEGATIVE Final   Influenza B by PCR NEGATIVE NEGATIVE Final    Comment: (NOTE) The Xpert Xpress SARS-CoV-2/FLU/RSV plus assay is intended as an aid in the diagnosis of influenza from Nasopharyngeal swab specimens and should not be used as a sole basis for treatment. Nasal washings and aspirates are unacceptable for Xpert Xpress SARS-CoV-2/FLU/RSV testing.  Fact Sheet for Patients: EntrepreneurPulse.com.au  Fact Sheet for Healthcare Providers: IncredibleEmployment.be  This test is not yet approved or cleared by the [place] FDA and has been authorized for detection and/or diagnosis of SARS-CoV-2 by FDA under an Emergency Use Authorization (EUA). This EUA will remain in effect (meaning this test can be used) for the duration of the COVID-19 declaration under Section 564(b)(1) of the Act, 21 U.S.C. section 360bbb-3(b)(1), unless the authorization is terminated or revoked.  Performed at [facility], [address], [place], [place] [address]   MRSA PCR Screening     Status: None   Collection Time: 03/07/20  2:19 PM   Specimen: Nasopharyngeal  Result Value Ref Range Status   MRSA by PCR NEGATIVE NEGATIVE Final    Comment:        The GeneXpert MRSA Assay (FDA approved for NASAL specimens only), is one component of a comprehensive MRSA colonization surveillance program. It is not intended to diagnose MRSA infection nor to guide or monitor treatment for MRSA infections. Performed at [facility], [address] [place], [place] [address]     Radiology Reports DG Chest Portable 1 View  Result Date: 03/07/2020 CLINICAL DATA:  Shortness of breath.  Pending COVID test. EXAM: PORTABLE CHEST 1 VIEW COMPARISON:  March 19, 2015 FINDINGS: No pneumothorax. There is a small right pleural effusion with underlying atelectasis. There may be a tiny left effusion as well. Cardiomegaly. The hila and mediastinum are unchanged. No pulmonary nodules, masses, or focal infiltrates. IMPRESSION: 1. Small bilateral pleural effusions with underlying atelectasis, right greater than left. No other pulmonary opacity. No overt edema. Mild pulmonary venous congestion not excluded. Electronically Signed   By: Dorise Bullion III M.D   On: 03/07/2020 11:53     CBC Recent Labs  Lab 03/07/20 1115 03/08/20 0602  WBC 5.9 5.0  HGB 12.8 12.8  HCT 42.5 42.3  PLT 247 247  MCV 89.1 88.7  MCH 26.8 26.8  MCHC 30.1 30.3  RDW 15.1 14.7  LYMPHSABS 1.3  --   MONOABS 0.7  --   EOSABS 0.0  --   BASOSABS 0.0  --     Chemistries  Recent Labs  Lab 03/07/20 1115 03/08/20 0602  NA 135 139  K 3.4* 3.9  CL 95* 95*  CO2 29 35*  GLUCOSE 157* 108*  BUN 11 12  CREATININE 0.98 0.93  CALCIUM 8.7* 8.6*  MG  --  1.1*  AST 27  --   ALT 24  --   ALKPHOS 59  --   BILITOT 1.5*  --    ------------------------------------------------------------------------------------------------------------------ No results  for input(s): CHOL, HDL, LDLCALC, TRIG, CHOLHDL, LDLDIRECT in the last 72 hours.  Lab Results  Component Value Date   HGBA1C 6.1 (H) 10/04/2011   ------------------------------------------------------------------------------------------------------------------ No results for input(s): TSH, T4TOTAL, T3FREE, THYROIDAB in the last 72 hours.  Invalid input(s): FREET3 ------------------------------------------------------------------------------------------------------------------ No results for input(s): VITAMINB12, FOLATE,  FERRITIN, TIBC, IRON, RETICCTPCT in the last 72 hours.  Coagulation profile No results for input(s): INR, PROTIME in the last 168 hours.  No results for input(s): DDIMER in the last 72 hours.  Cardiac Enzymes No results for input(s): CKMB, TROPONINI, MYOGLOBIN in the last 168 hours.  Invalid input(s): CK ------------------------------------------------------------------------------------------------------------------    Component Value Date/Time   BNP 985.0 (H) 03/07/2020 1115     Roxan Hockey M.D on 03/08/2020 at 4:10 PM  Go to [email or web address] - for contact info  [facility] - Office  [phone]

## 2020-03-08 NOTE — Progress Notes (Signed)
*  PRELIMINARY RESULTS* Echocardiogram 2D Echocardiogram has been performed.  Leavy Cella 03/08/2020, 11:01 AM

## 2020-03-09 ENCOUNTER — Inpatient Hospital Stay (HOSPITAL_COMMUNITY): Payer: Medicare HMO

## 2020-03-09 DIAGNOSIS — Z720 Tobacco use: Secondary | ICD-10-CM

## 2020-03-09 DIAGNOSIS — I10 Essential (primary) hypertension: Secondary | ICD-10-CM

## 2020-03-09 DIAGNOSIS — I739 Peripheral vascular disease, unspecified: Secondary | ICD-10-CM

## 2020-03-09 DIAGNOSIS — J441 Chronic obstructive pulmonary disease with (acute) exacerbation: Principal | ICD-10-CM

## 2020-03-09 DIAGNOSIS — I5033 Acute on chronic diastolic (congestive) heart failure: Secondary | ICD-10-CM

## 2020-03-09 LAB — RENAL FUNCTION PANEL
Albumin: 3.3 g/dL — ABNORMAL LOW (ref 3.5–5.0)
Anion gap: 9 (ref 5–15)
BUN: 21 mg/dL (ref 8–23)
CO2: 31 mmol/L (ref 22–32)
Calcium: 9 mg/dL (ref 8.9–10.3)
Chloride: 95 mmol/L — ABNORMAL LOW (ref 98–111)
Creatinine, Ser: 1.09 mg/dL — ABNORMAL HIGH (ref 0.44–1.00)
GFR, Estimated: 54 mL/min — ABNORMAL LOW (ref >60)
Glucose, Bld: 134 mg/dL — ABNORMAL HIGH (ref 70–99)
Phosphorus: 2.4 mg/dL — ABNORMAL LOW (ref 2.5–4.6)
Potassium: 5.4 mmol/L — ABNORMAL HIGH (ref 3.5–5.1)
Sodium: 135 mmol/L (ref 135–145)

## 2020-03-09 MED ORDER — IPRATROPIUM-ALBUTEROL 0.5-2.5 (3) MG/3ML IN SOLN: 3.0000 mL | Freq: Three times a day (TID) | RESPIRATORY_TRACT | Status: DC

## 2020-03-09 MED ORDER — PREDNISONE 20 MG PO TABS: ORAL_TABLET | ORAL | 0 refills | Status: DC

## 2020-03-09 MED ORDER — NICOTINE 14 MG/24HR TD PT24: 14.0000 mg | MEDICATED_PATCH | Freq: Every day | TRANSDERMAL | 2 refills | Status: DC

## 2020-03-09 MED ORDER — FUROSEMIDE 40 MG PO TABS: 40.0000 mg | ORAL_TABLET | Freq: Every day | ORAL | 3 refills | Status: DC

## 2020-03-09 NOTE — Progress Notes (Signed)
SATURATION QUALIFICATIONS: (This note is used to comply with regulatory documentation for home oxygen)  Patient Saturations on Room Air at Rest = 90%  Patient Saturations on Room Air while Ambulating = 99%  Patient Saturations on 2 Liters of oxygen while Ambulating = 92%  Please briefly explain why patient needs home oxygen: Rechecked patient ambulating without oxygen patient did well until returned to room patient dropped to 84 on room air placed on 2 liters oxygen saturation came back up to 92. MD aware

## 2020-03-09 NOTE — Progress Notes (Signed)
Dr.madera wanted this nurse to ambulate patient without oxygen to make sure patient does not qualify for home oxygen prior to discharge.  Patient walked down the hall to nurses station and back on room air. Oxygen saturation was 93-94% on room air. Patient returned to room and resting maintained 93 on room air. Current reading is 97 on room air. Dr. Dyann Kief made aware of readings.

## 2020-03-09 NOTE — Progress Notes (Signed)
SATURATION QUALIFICATIONS: (This note is used to comply with regulatory documentation for home oxygen)  Patient Saturations on Room Air at Rest = 95%  Patient Saturations on Room Air while Ambulating = 93%  Patient Saturations on 0 Liters of oxygen while Ambulating = 93%  Please briefly explain why patient needs home oxygen: 

## 2020-03-09 NOTE — Discharge Summary (Signed)
Physician Discharge Summary  Leslie [name] NLZ:[id number] DOB: 19-Aug-1945 DOA: 03/07/2020  PCP: Doree Albee, MD  Admit date: 03/07/2020 Discharge date: 03/09/2020  Time spent: 35 minutes  Recommendations for Outpatient Follow-up:  Repeat basic metabolic panel to follow lites and renal function Reassess blood pressure and further adjust antihypertensive treatment as required. Continue assisting patient with tobacco cessation. Arrange outpatient follow-up with pulmonology for PFTs and further adjustment of maintenance therapy of her COPD.  Discharge Diagnoses:  Acute respiratory failure with hypoxia PAD, high grade RICA by MRI 10/03/11 Tobacco abuse Renal artery stenosis: left 50% 2005 Non compliance w medication regimen secondary to cost Arthritis, rheumatoid (HCC) Hypertension Exacerbation of COPD (chronic obstructive pulmonary disease) ([place]) Acute on chronic diastolic HF (heart failure) ([place])   Discharge Condition: Stable and improved.  Discharged home with instruction to follow-up with PCP in 10 days.  CODE STATUS: Full code.  Diet recommendation: Heart healthy diet.  Filed Weights   03/07/20 1424 03/08/20 0613 03/09/20 0602  Weight: 74.6 kg 74.5 kg 76.3 kg    History of present illness:  As per H&P written by Dr. Manuella Ghazi on 03/07/2020 Leslie [name] is a 74 y.o. female with medical history significant for hypertension, dyslipidemia, COPD with ongoing tobacco use, PAD/carotid artery disease, and hypertrophic cardiomyopathy with established chronic diastolic CHF who presented to the emergency department with worsening shortness of breath that began last night.  She was also noted to have trouble with laying flat as she would become more short of breath and she is also noted to have some swelling to her legs.  She also endorses a cough without much sputum production.  She initially went to the urgent care and was noted to have room air sats of approximately 78% and therefore was  started on nasal cannula oxygen and brought to the ED for further evaluation.  She states that she does not routinely monitor her blood pressure readings and knows that it usually is elevated.  She tries to avoid sodium intake, but knows that she is getting this through some of the processed foods that she eats.   ED Course: Patient noted to have elevated blood pressure readings of 902 systolic and 409 diastolic.  BNP is 95 and initial troponin elevated at 78.  EKG with sinus tachycardia and no acute findings noted.  Covid testing negative.  Chest x-ray with small bilateral pleural effusions and mild congestion noted.  She is currently without respiratory distress on 2 L nasal cannula oxygen.  Noted to have mild hypokalemia of 3.4 lab work.  Hospital Course:  1-Acute respiratory failure with hypoxia in the setting of acute on chronic diastolic heart failure and COPD exacerbation At discharge no oxygen supplementation required -Patient will complete steroids tapering and continue as needed bronchodilators. -Instructed to follow low-sodium diet, to be compliant with her antihypertensive agents and to check her diet on daily basis. -Patient has been discharged on Lasix 40 mg by mouth daily -Outpatient follow-up with PCP and cardiology service. -2D echo demonstrating preserved ejection fraction with grade 1 diastolic dysfunction.  No wall function and normalities or significant valvular disorders appreciated.  2-COPD exacerbation Tobacco abuse -Outpatient follow-up with pulmonologist for PFTs recommended -Steroids tapering as mentioned above has been provided at discharge -Patient instructed to stop tobacco abuse.  3-tobacco abuse -Cessation counseling provided -Nicotine patch prescription provided at discharge.  4-hypertension -Patient presented with hypertensive crisis blood pressure significantly improved after medications added ambulation status corrected -Advised to follow low-sodium  diet -  Also instructed to be compliant with medication -Patient now discharged on Lasix along with the use of metoprolol and losartan. -Reassess blood pressure and further adjust antihypertensive regimen as needed.  5-hyperlipidemia -Continue statins  6-history of.  Carotid disease/carotid artery stenosis -Continue the use of Pletal, statin and aspirin -Patient advised to follow heart healthy diet.  7-hypokalemia/hyperkalemia -In the setting of diuretics usage and repletion -Adjusted dose of diuretics and oral supplementation instructed at time of discharge -Telemetry/EKG without acute abnormalities. -Repeat basic metabolic panel follow-up visit to assess stability.   Procedures: See below for x-ray report 2D echo: 1. Left ventricular ejection fraction, by estimation, is 70 to 75%. The  left ventricle has hyperdynamic function. The left ventricle has no  regional wall motion abnormalities. There is severe left ventricular  hypertrophy. Left ventricular diastolic  parameters are consistent with Grade I diastolic dysfunction (impaired  relaxation). Elevated left atrial pressure.  2. Right ventricular systolic function is normal. The right ventricular  size is normal.  3. LV contraction is vigorous with near cavity obliteration. There is an  intracavitary gradient that is largest at the LV outflow tract without  evidence of obstruction at rest.  4. Markedly elevated LAP with E/e' 40.  5. Left atrial size was moderately dilated.  6. Mild to moderate mitral regurgitation  7. Tricuspid valve regurgitation is moderate.  8. Severe pulmonary hypertension with RVSP of 55mmHg.  9. The aortic valve is tricuspid. There is mild calcification of the  aortic valve. There is mild thickening of the aortic valve. Aortic valve  regurgitation is not visualized.  10. The inferior vena cava is dilated in size with >50% respiratory  variability, suggesting right atrial pressure of 8 mmHg.    Consultations:  None  Discharge Exam: Vitals:   03/09/20 0743 03/09/20 0826  BP:  (!) 159/70  Pulse:  83  Resp:  19  Temp:    SpO2: 99% 100%    General: Feeling well, no chest pain, no nausea, no vomiting.  Patient reports no shortness of breath currently and good saturation on room air.  Feels ready to go home. Cardiovascular: S1 and S2, no rubs, no gallops, no JVD. Respiratory: Improved air movement bilaterally; currently no wheezing; positive scattered rhonchi.  No crackles. Abdomen: Soft, nontender, distended, positive bowel sounds Extremities: No cyanosis or clubbing.  Discharge Instructions   Discharge Instructions    (HEART FAILURE PATIENTS) Call MD:  Anytime you have any of the following symptoms: 1) 3 pound weight gain in 24 hours or 5 pounds in 1 week 2) shortness of breath, with or without a dry hacking cough 3) swelling in the hands, feet or stomach 4) if you have to sleep on extra pillows at night in order to breathe.   Complete by: As directed    Diet - low sodium heart healthy   Complete by: As directed    Discharge instructions   Complete by: As directed    Take medications as prescribed maintain adequate hydration Follow low-sodium diet (less than 2.5 g daily) Check your weight on daily basis; contact PCP office right away for an increment or more than 3 pounds overnight and/or more than 5 pounds in a week. Arrange follow-up with PCP in 10 days Stop smoking.     Allergies as of 03/09/2020      Reactions   Latex Other (See Comments)   Burns skin    Tape Other (See Comments), Itching   Burns skin    Atorvastatin Other (  See Comments)   myalgia myalgia      Medication List    STOP taking these medications   celecoxib 100 MG capsule Commonly known as: CELEBREX   IBU 800 MG tablet Generic drug: ibuprofen     TAKE these medications   albuterol 108 (90 Base) MCG/ACT inhaler Commonly known as: VENTOLIN HFA INHALE 2 INHALATIONS INTO THE LUNGS  EVERY 6 HOURS AS NEEDED FOR WHEEZING What changed: See the new instructions.   aspirin EC 81 MG tablet Take 81 mg by mouth daily.   cilostazol 100 MG tablet Commonly known as: PLETAL TAKE 1 TABLET(100 MG) BY MOUTH TWICE DAILY   furosemide 40 MG tablet Commonly known as: LASIX Take 1 tablet (40 mg total) by mouth daily. TAKE 1 TABLET(20 MG) BY MOUTH DAILY AS NEEDED FOR SWELLING What changed:   medication strength  how much to take  how to take this  when to take this   hydroquinone 4 % cream Apply topically 2 (two) times daily.   losartan 50 MG tablet Commonly known as: COZAAR Take 1 tablet (50 mg total) by mouth 2 (two) times daily.   metoprolol succinate 25 MG 24 hr tablet Commonly known as: Toprol XL Take 1 tablet (25 mg total) by mouth daily.   nicotine 14 mg/24hr patch Commonly known as: NICODERM CQ - dosed in mg/24 hours Place 1 patch (14 mg total) onto the skin daily. Start taking on: March 10, 2020   pantoprazole 40 MG tablet Commonly known as: PROTONIX TAKE 1 TABLET EVERY DAY   potassium chloride 10 MEQ tablet Commonly known as: KLOR-CON TAKE 1 TABLET BY MOUTH EVERY DAY WHEN YOU TAKE FUROSEMIDE(LASIX)   predniSONE 20 MG tablet Commonly known as: DELTASONE Take 2 tablets by mouth daily x2 days; then 1 tablet by mouth daily x3 days; then half tablet by mouth daily x3 days and stop prednisone.   rosuvastatin 10 MG tablet Commonly known as: CRESTOR TAKE 1 TABLET EVERY DAY   Vitamin D-3 125 MCG (5000 UT) Tabs Take 2 tablets by mouth daily.      Allergies  Allergen Reactions  . Latex Other (See Comments)    Burns skin   . Tape Other (See Comments) and Itching    Burns skin   . Atorvastatin Other (See Comments)    myalgia myalgia    Follow-up Information    Gosrani, Nimish C, MD. Schedule an appointment as soon as possible for a visit in 10 day(s).   Specialty: Internal Medicine Contact information: [place]  [address] [phone]        Satira Sark, MD .   Specialty: Cardiology Contact information: [place] [address] [phone]               The results of significant diagnostics from this hospitalization (including imaging, microbiology, ancillary and laboratory) are listed below for reference.    Significant Diagnostic Studies: DG Chest Portable 1 View  Result Date: 03/07/2020 CLINICAL DATA:  Shortness of breath.  Pending COVID test. EXAM: PORTABLE CHEST 1 VIEW COMPARISON:  March 19, 2015 FINDINGS: No pneumothorax. There is a small right pleural effusion with underlying atelectasis. There may be a tiny left effusion as well. Cardiomegaly. The hila and mediastinum are unchanged. No pulmonary nodules, masses, or focal infiltrates. IMPRESSION: 1. Small bilateral pleural effusions with underlying atelectasis, right greater than left. No other pulmonary opacity. No overt edema. Mild pulmonary venous congestion not excluded. Electronically  Signed   By: Dorise Bullion III M.D   On: 03/07/2020 11:53   ECHOCARDIOGRAM COMPLETE  Result Date: 03/08/2020    ECHOCARDIOGRAM REPORT   Patient Name:   Leslie [name] Date of Exam: 03/08/2020 Medical Rec #:  [id number]    Height:       67.0 in Accession #:    [id number]   Weight:       164.2 lb Date of Birth:  Nov 17, 1945     BSA:          1.860 m Patient Age:    30 years     BP:           128/88 mmHg Patient Gender: F            HR:           89 bpm. Exam Location:  [facility] Procedure: 2D Echo Indications:    I50.33 Acute on chronic diastolic (congestive) heart failure  History:        Patient has prior history of Echocardiogram examinations, most                 recent 03/10/2019. CHF, Signs/Symptoms:Shortness of Breath; Risk                 Factors:Hypertension.  Sonographer:    Leavy Cella RDCS (AE) Referring Phys: [phone] Royanne Foots [place]  1. Left ventricular ejection fraction, by estimation, is 70 to 75%. The  left ventricle has hyperdynamic function. The left ventricle has no regional wall motion abnormalities. There is severe left ventricular hypertrophy. Left ventricular diastolic parameters are consistent with Grade I diastolic dysfunction (impaired relaxation). Elevated left atrial pressure.  2. Right ventricular systolic function is normal. The right ventricular size is normal.  3. LV contraction is vigorous with near cavity obliteration. There is an intracavitary gradient that is largest at the LV outflow tract without evidence of obstruction at rest.  4. Markedly elevated LAP with E/e' 40.  5. Left atrial size was moderately dilated.  6. Mild to moderate mitral regurgitation  7. Tricuspid valve regurgitation is moderate.  8. Severe pulmonary hypertension with RVSP of 41mmHg.  9. The aortic valve is tricuspid. There is mild calcification of the aortic valve. There is mild thickening of the aortic valve. Aortic valve regurgitation is not visualized. 10. The inferior vena cava is dilated in size with >50% respiratory variability, suggesting right atrial pressure of 8 mmHg. Comparison(s): Compared to prior TTE in 03/09/20, the TR now appears moderate. Otherwise, there is no significant change. FINDINGS  Left Ventricle: Left ventricular ejection fraction, by estimation, is 70 to 75%. The left ventricle has hyperdynamic function. The left ventricle has no regional wall motion abnormalities. The left ventricular internal cavity size was normal in size. There is severe left ventricular hypertrophy. LV contraction is vigorous with near cavity obliteration. There is an intracavitary gradient that is largest at the LV outflow tract without evidence of obstruction at rest. Left ventricular diastolic parameters are consistent with Grade I diastolic dysfunction (impaired relaxation). Elevated left atrial pressure. The E/e' is 40.5. Right Ventricle: The right ventricular size is normal. Right vetricular wall thickness was not  well visualized. Right ventricular systolic function is normal. Left Atrium: Left atrial size was moderately dilated. Right Atrium: Right atrial size was normal in size. Pericardium: There is no evidence of pericardial effusion. Mitral Valve: The mitral valve is abnormal. There is moderate thickening of the mitral valve leaflet(s). There is mild calcification  of the mitral valve leaflet(s). Mild to moderate mitral annular calcification. Mild to moderate mitral valve regurgitation. There is moderate calcific mitral stenosis with MVA by VTI 1.6cm2 with mean gradient 69mmHg at HR 82bpm. Tricuspid Valve: The tricuspid valve is normal in structure. Tricuspid valve regurgitation is moderate. Severe pulmonary hypertension with RVSP 39mmHg. Aortic Valve: The aortic valve is tricuspid. There is mild calcification of the aortic valve. There is mild thickening of the aortic valve. Aortic valve regurgitation is not visualized. Pulmonic Valve: The pulmonic valve was normal in structure. Pulmonic valve regurgitation is trivial. Aorta: The aortic root is normal in size and structure. Venous: The inferior vena cava is dilated in size with greater than 50% respiratory variability, suggesting right atrial pressure of 8 mmHg. IAS/Shunts: There is right bowing of the interatrial septum, suggestive of elevated left atrial pressure. No atrial level shunt detected by color flow Doppler.  LEFT VENTRICLE PLAX 2D LVIDd:         3.05 cm  Diastology LVIDs:         1.67 cm  LV e' medial:    3.73 cm/s LV PW:         1.93 cm  LV E/e' medial:  40.5 LV IVS:        1.39 cm  LV e' lateral:   3.49 cm/s LVOT diam:     1.90 cm  LV E/e' lateral: 43.3 LVOT Area:     2.84 cm  RIGHT VENTRICLE RV S prime:     14.70 cm/s TAPSE (M-mode): 2.1 cm LEFT ATRIUM             Index       RIGHT ATRIUM           Index LA diam:        3.80 cm 2.04 cm/m  RA Area:     12.00 cm LA Vol (A2C):   94.1 ml 50.60 ml/m RA Volume:   28.10 ml  15.11 ml/m LA Vol (A4C):   73.7 ml  39.63 ml/m LA Biplane Vol: 86.7 ml 46.62 ml/m   AORTA Ao Root diam: 2.20 cm MITRAL VALVE                TRICUSPID VALVE MV Area (PHT): 2.50 cm     TR Peak grad:   68.9 mmHg MV Decel Time: 304 msec     TR Vmax:        415.00 cm/s MV E velocity: 151.00 cm/s MV A velocity: 170.00 cm/s  SHUNTS MV E/A ratio:  0.89         Systemic Diam: 1.90 cm Gwyndolyn Kaufman MD Electronically signed by Gwyndolyn Kaufman MD Signature Date/Time: 03/08/2020/5:16:30 PM    Final     Microbiology: Recent Results (from the past 240 hour(s))  Resp Panel by RT-PCR (Flu A&B, Covid) Nasopharyngeal Swab     Status: None   Collection Time: 03/07/20 11:16 AM   Specimen: Nasopharyngeal Swab; Nasopharyngeal(NP) swabs in vial transport medium  Result Value Ref Range Status   SARS Coronavirus 2 by RT PCR NEGATIVE NEGATIVE Final    Comment: (NOTE) SARS-CoV-2 target nucleic acids are NOT DETECTED.  The SARS-CoV-2 RNA is generally detectable in upper respiratory specimens during the acute phase of infection. The lowest concentration of SARS-CoV-2 viral copies this assay can detect is 138 copies/mL. A negative result does not preclude SARS-Cov-2 infection and should not be used as the sole basis for treatment or other patient management decisions. A negative result may  occur with  improper specimen collection/handling, submission of specimen other than nasopharyngeal swab, presence of viral mutation(s) within the areas targeted by this assay, and inadequate number of viral copies(<138 copies/mL). A negative result must be combined with clinical observations, patient history, and epidemiological information. The expected result is Negative.  Fact Sheet for Patients:  EntrepreneurPulse.com.au  Fact Sheet for Healthcare Providers:  IncredibleEmployment.be  This test is no t yet approved or cleared by the [place] FDA and  has been authorized for detection and/or diagnosis of  SARS-CoV-2 by FDA under an Emergency Use Authorization (EUA). This EUA will remain  in effect (meaning this test can be used) for the duration of the COVID-19 declaration under Section 564(b)(1) of the Act, 21 U.S.C.section 360bbb-3(b)(1), unless the authorization is terminated  or revoked sooner.       Influenza A by PCR NEGATIVE NEGATIVE Final   Influenza B by PCR NEGATIVE NEGATIVE Final    Comment: (NOTE) The Xpert Xpress SARS-CoV-2/FLU/RSV plus assay is intended as an aid in the diagnosis of influenza from Nasopharyngeal swab specimens and should not be used as a sole basis for treatment. Nasal washings and aspirates are unacceptable for Xpert Xpress SARS-CoV-2/FLU/RSV testing.  Fact Sheet for Patients: EntrepreneurPulse.com.au  Fact Sheet for Healthcare Providers: IncredibleEmployment.be  This test is not yet approved or cleared by the [place] FDA and has been authorized for detection and/or diagnosis of SARS-CoV-2 by FDA under an Emergency Use Authorization (EUA). This EUA will remain in effect (meaning this test can be used) for the duration of the COVID-19 declaration under Section 564(b)(1) of the Act, 21 U.S.C. section 360bbb-3(b)(1), unless the authorization is terminated or revoked.  Performed at [facility], [address], [place], [place] [address]   MRSA PCR Screening     Status: None   Collection Time: 03/07/20  2:19 PM   Specimen: Nasopharyngeal  Result Value Ref Range Status   MRSA by PCR NEGATIVE NEGATIVE Final    Comment:        The GeneXpert MRSA Assay (FDA approved for NASAL specimens only), is one component of a comprehensive MRSA colonization surveillance program. It is not intended to diagnose MRSA infection nor to guide or monitor treatment for MRSA infections. Performed at [facility], [address] [place], [place] [address]      Labs: Basic Metabolic Panel: Recent Labs  Lab 03/07/20 1115  03/08/20 0602 03/09/20 0324  NA 135 139 135  K 3.4* 3.9 5.4*  CL 95* 95* 95*  CO2 29 35* 31  GLUCOSE 157* 108* 134*  BUN 11 12 21   CREATININE 0.98 0.93 1.09*  CALCIUM 8.7* 8.6* 9.0  MG  --  1.1*  --   PHOS  --   --  2.4*   Liver Function Tests: Recent Labs  Lab 03/07/20 1115 03/09/20 0324  AST 27  --   ALT 24  --   ALKPHOS 59  --   BILITOT 1.5*  --   PROT 7.4  --   ALBUMIN 3.6 3.3*   CBC: Recent Labs  Lab 03/07/20 1115 03/08/20 0602  WBC 5.9 5.0  NEUTROABS 3.9  --   HGB 12.8 12.8  HCT 42.5 42.3  MCV 89.1 88.7  PLT 247 247   BNP (last 3 results) Recent Labs    03/07/20 1115  BNP 985.0*    Signed:  Barton Dubois MD.  [facility] 03/09/2020, 12:48 PM

## 2020-03-09 NOTE — Progress Notes (Signed)
Nsg Discharge Note  Admit Date:  03/07/2020 Discharge date: 03/09/2020   Leslie [name] to be D/C'd Home per MD order.  AVS completed.  Copy for chart, and copy for patient signed, and dated. Patient/caregiver able to verbalize understanding. IV removed. Discharge paper work given and reviewed with patient by SWOT nurse Benjamine Mola RN). Patient stable upon discharge.   Discharge Medication: Allergies as of 03/09/2020      Reactions   Latex Other (See Comments)   Burns skin    Tape Other (See Comments), Itching   Burns skin    Atorvastatin Other (See Comments)   myalgia myalgia      Medication List    STOP taking these medications   celecoxib 100 MG capsule Commonly known as: CELEBREX   IBU 800 MG tablet Generic drug: ibuprofen     TAKE these medications   albuterol 108 (90 Base) MCG/ACT inhaler Commonly known as: VENTOLIN HFA INHALE 2 INHALATIONS INTO THE LUNGS EVERY 6 HOURS AS NEEDED FOR WHEEZING What changed: See the new instructions.   aspirin EC 81 MG tablet Take 81 mg by mouth daily.   cilostazol 100 MG tablet Commonly known as: PLETAL TAKE 1 TABLET(100 MG) BY MOUTH TWICE DAILY   furosemide 40 MG tablet Commonly known as: LASIX Take 1 tablet (40 mg total) by mouth daily. TAKE 1 TABLET(20 MG) BY MOUTH DAILY AS NEEDED FOR SWELLING What changed:   medication strength  how much to take  how to take this  when to take this   hydroquinone 4 % cream Apply topically 2 (two) times daily.   losartan 50 MG tablet Commonly known as: COZAAR Take 1 tablet (50 mg total) by mouth 2 (two) times daily.   metoprolol succinate 25 MG 24 hr tablet Commonly known as: Toprol XL Take 1 tablet (25 mg total) by mouth daily.   nicotine 14 mg/24hr patch Commonly known as: NICODERM CQ - dosed in mg/24 hours Place 1 patch (14 mg total) onto the skin daily. Start taking on: March 10, 2020   pantoprazole 40 MG tablet Commonly known as: PROTONIX TAKE 1 TABLET EVERY DAY    potassium chloride 10 MEQ tablet Commonly known as: KLOR-CON TAKE 1 TABLET BY MOUTH EVERY DAY WHEN YOU TAKE FUROSEMIDE(LASIX)   predniSONE 20 MG tablet Commonly known as: DELTASONE Take 2 tablets by mouth daily x2 days; then 1 tablet by mouth daily x3 days; then half tablet by mouth daily x3 days and stop prednisone.   rosuvastatin 10 MG tablet Commonly known as: CRESTOR TAKE 1 TABLET EVERY DAY   Vitamin D-3 125 MCG (5000 UT) Tabs Take 2 tablets by mouth daily.       Discharge Assessment: Vitals:   03/09/20 0743 03/09/20 0826  BP:  (!) 159/70  Pulse:  83  Resp:  19  Temp:    SpO2: 99% 100%   Skin clean, dry and intact without evidence of skin break down, no evidence of skin tears noted. IV catheter discontinued intact. Site without signs and symptoms of complications - no redness or edema noted at insertion site, patient denies c/o pain - only slight tenderness at site.  Dressing with slight pressure applied.  D/c Instructions-Education: Discharge instructions given to patient/family with verbalized understanding. D/c education completed with patient/family including follow up instructions, medication list, d/c activities limitations if indicated, with other d/c instructions as indicated by MD - patient able to verbalize understanding, all questions fully answered. Patient instructed to return to ED, call 911,  or call MD for any changes in condition.  Patient escorted via [place], and D/C home via private auto.  Zenaida Deed, RN 03/09/2020 1:36 PM

## 2020-03-10 ENCOUNTER — Ambulatory Visit (INDEPENDENT_AMBULATORY_CARE_PROVIDER_SITE_OTHER): Payer: Medicare HMO | Admitting: Internal Medicine

## 2020-03-10 ENCOUNTER — Other Ambulatory Visit: Payer: Self-pay

## 2020-03-10 NOTE — Patient Outreach (Signed)
[facility]) Care Management  03/10/2020  Leslie [name] 03-04-1946 [id number]     Transition of Care Referral  Referral Date: 03/10/2020 Referral Source: [facility] Discharge Report Date of Discharge: 03/09/2020 Facility: [place]: [facility]    Referral received. Transition of care calls being completed via EMMI-automated calls. RN CM will outreach patient for any red flags received.     Plan: RN CM will close case at this time.    Enzo Montgomery, RN,BSN,CCM [place] [facility] Telephonic Care Management Coordinator Direct Phone: [phone] Toll Free: [phone] Fax: [phone]

## 2020-03-17 IMAGING — MG DIGITAL SCREENING BILAT W/ TOMO W/ CAD
6 of 10 series · 6 of 30 positions shown · non-contrast
Comparison: Previous exam(s).

CLINICAL DATA: Screening.

EXAM:
DIGITAL SCREENING BILATERAL MAMMOGRAM WITH TOMO AND CAD

[R CC synth-2D]
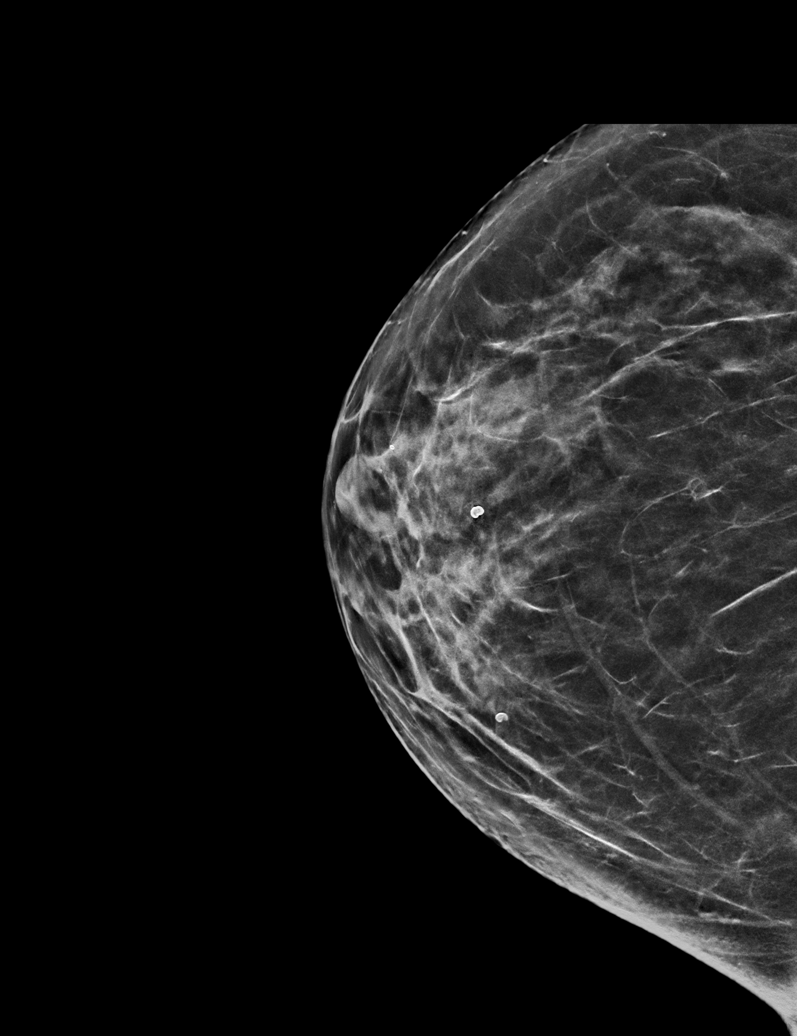

[R MLO synth-2D (1 of 2)]
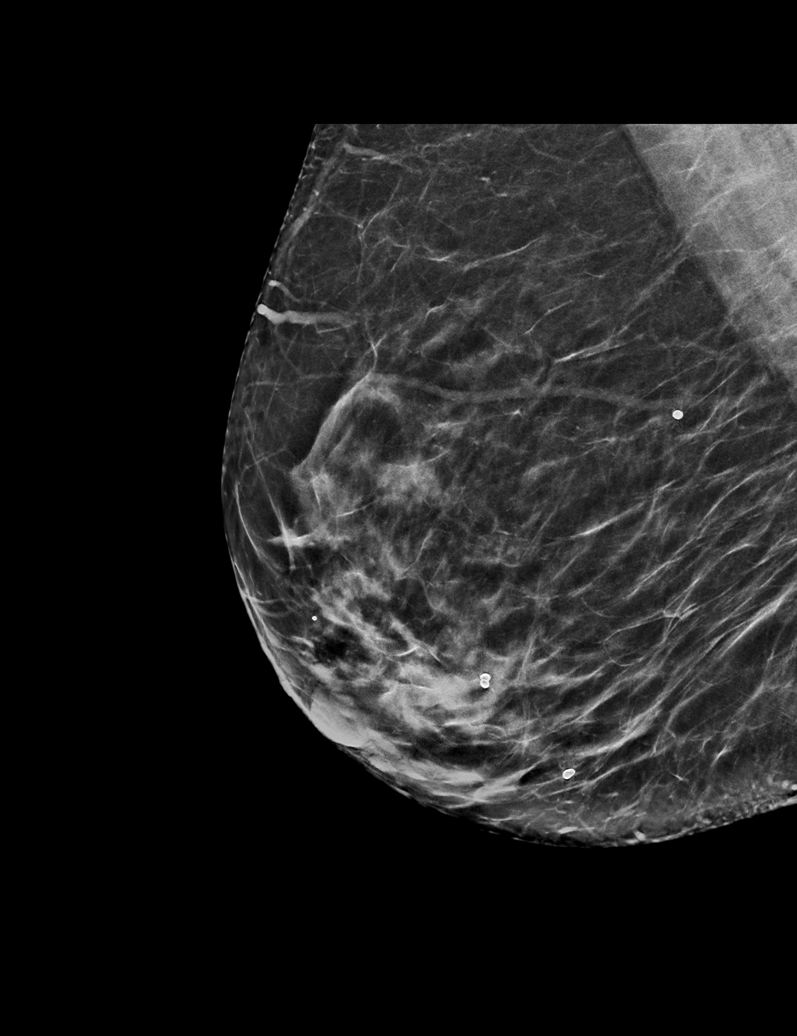

[L MLO synth-2D]
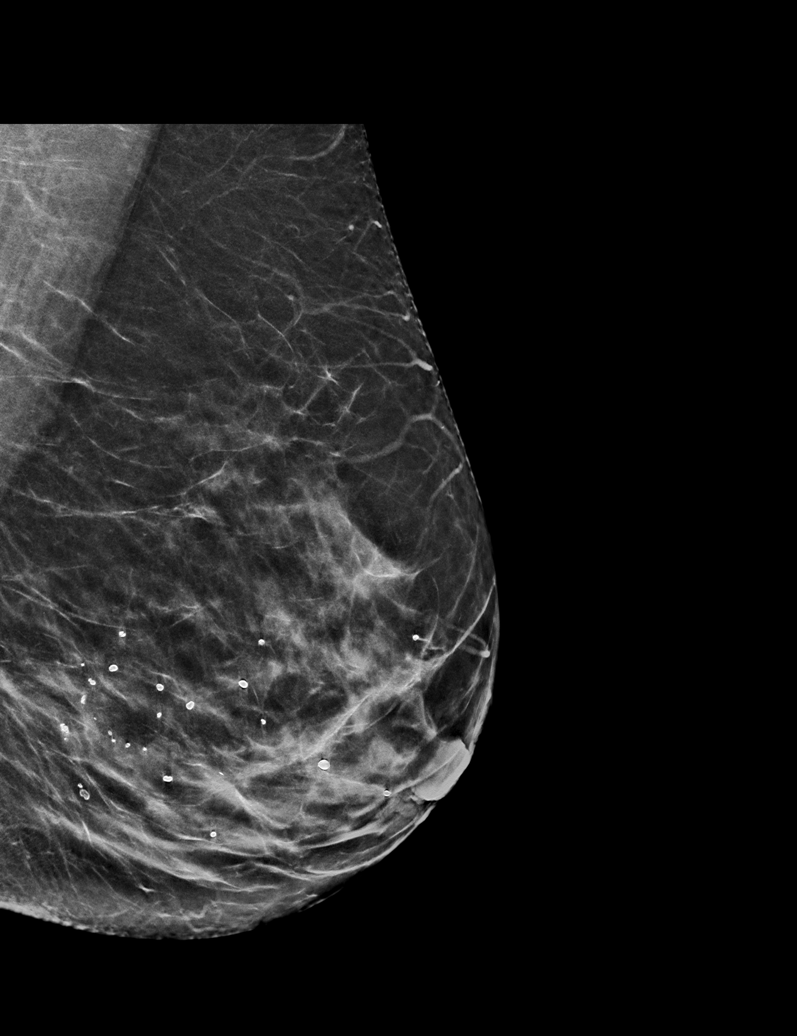

[R MLO synth-2D (2 of 2)]
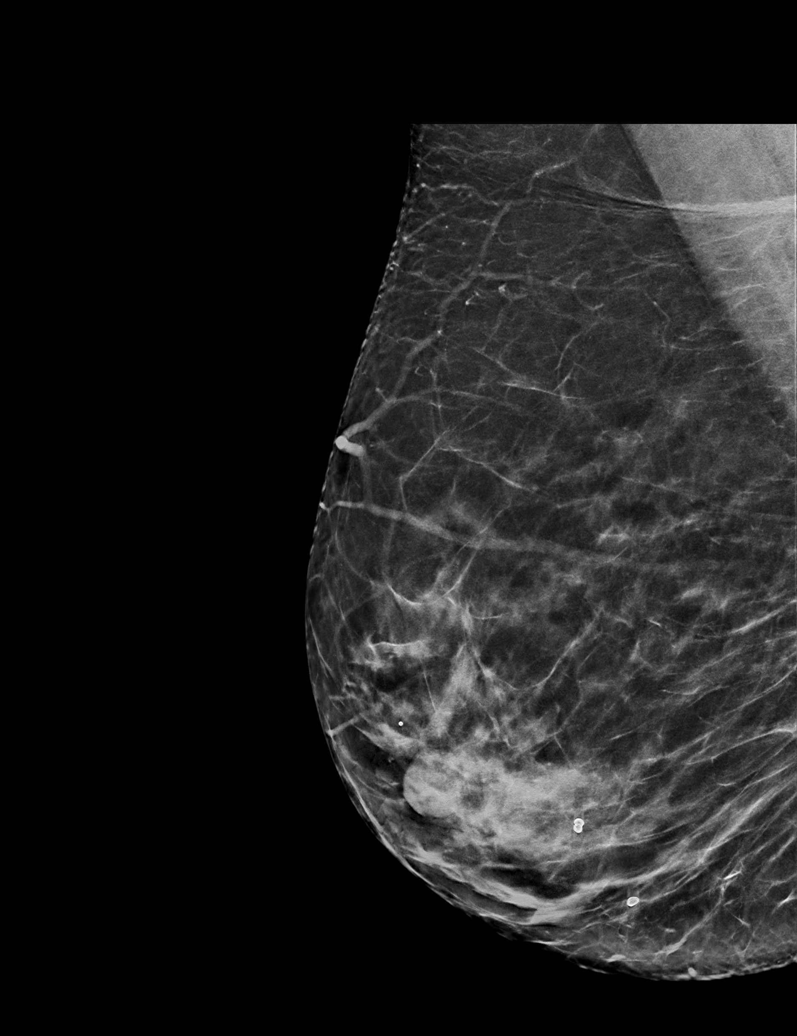

[L CC synth-2D]
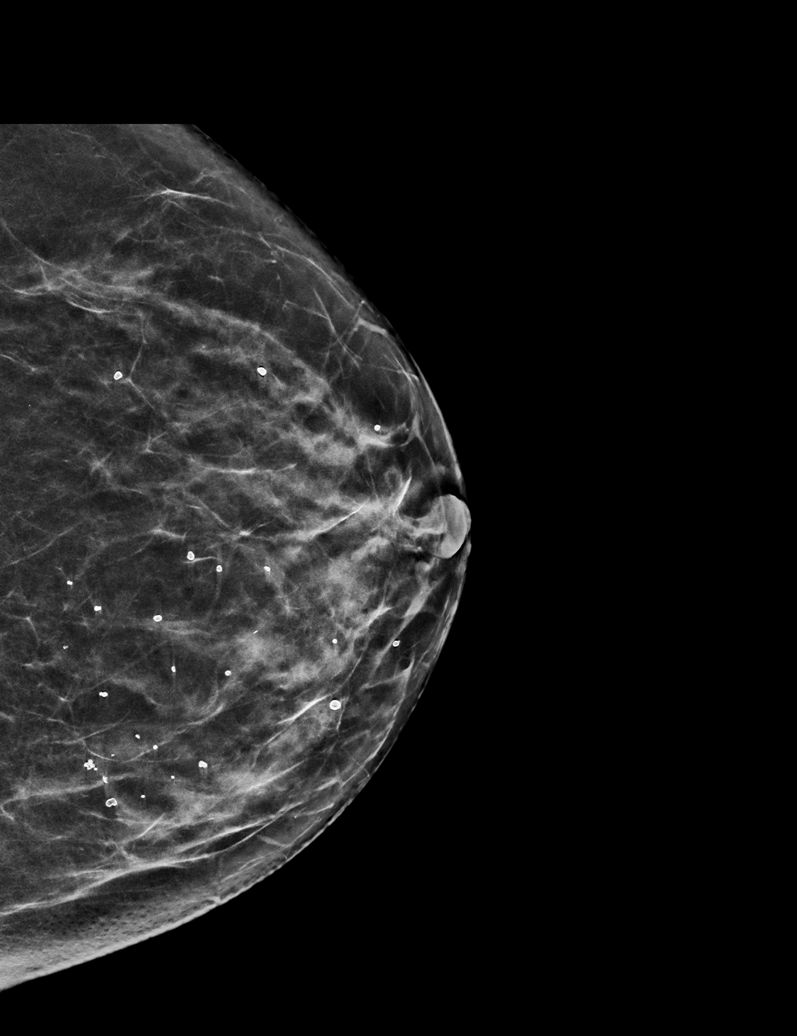

[L MLO tomo · tomo slice 34/67.0]
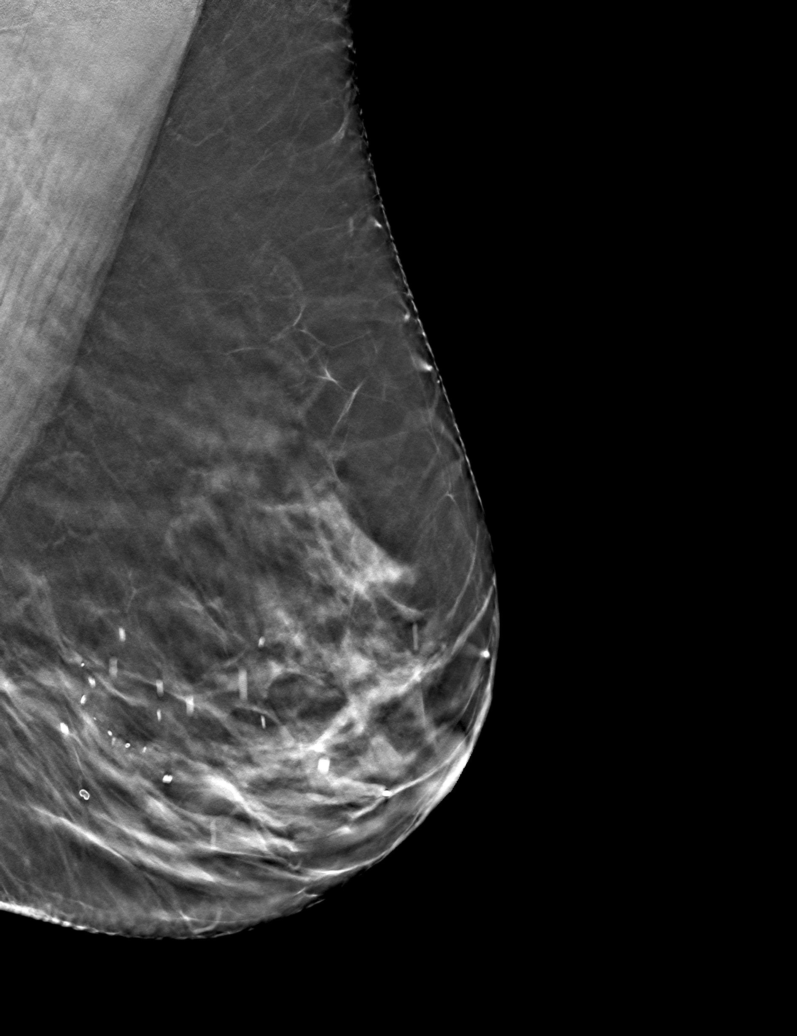

[6 of 30 positions shown; findings below may reference images not displayed]

ACR Breast Density Category c: The breast tissue is heterogeneously
dense, which may obscure small masses.
FINDINGS: There are no findings suspicious for malignancy. Images were
processed with CAD.
IMPRESSION: No mammographic evidence of malignancy. A result letter of this
screening mammogram will be mailed directly to the patient.

RECOMMENDATION:
Screening mammogram in one year. (Code:[id number]-[id number])

BI-RADS CATEGORY  1: Negative.

## 2020-03-23 ENCOUNTER — Ambulatory Visit (INDEPENDENT_AMBULATORY_CARE_PROVIDER_SITE_OTHER): Payer: Medicare HMO | Admitting: Nurse Practitioner

## 2020-03-23 ENCOUNTER — Other Ambulatory Visit (INDEPENDENT_AMBULATORY_CARE_PROVIDER_SITE_OTHER): Payer: Self-pay | Admitting: Internal Medicine

## 2020-03-31 ENCOUNTER — Other Ambulatory Visit (INDEPENDENT_AMBULATORY_CARE_PROVIDER_SITE_OTHER): Payer: Self-pay | Admitting: Internal Medicine

## 2020-03-31 DIAGNOSIS — R0989 Other specified symptoms and signs involving the circulatory and respiratory systems: Secondary | ICD-10-CM

## 2020-04-01 ENCOUNTER — Other Ambulatory Visit (INDEPENDENT_AMBULATORY_CARE_PROVIDER_SITE_OTHER): Payer: Self-pay | Admitting: Internal Medicine

## 2020-04-07 ENCOUNTER — Ambulatory Visit (INDEPENDENT_AMBULATORY_CARE_PROVIDER_SITE_OTHER): Payer: Medicare HMO | Admitting: Nurse Practitioner

## 2020-04-07 ENCOUNTER — Other Ambulatory Visit: Payer: Self-pay

## 2020-04-07 ENCOUNTER — Encounter (INDEPENDENT_AMBULATORY_CARE_PROVIDER_SITE_OTHER): Payer: Self-pay | Admitting: Nurse Practitioner

## 2020-04-07 VITALS — BP 166/82 | Ht 67.0 in | Wt 161.0 lb

## 2020-04-07 DIAGNOSIS — E559 Vitamin D deficiency, unspecified: Secondary | ICD-10-CM

## 2020-04-07 DIAGNOSIS — R609 Edema, unspecified: Secondary | ICD-10-CM

## 2020-04-07 DIAGNOSIS — J449 Chronic obstructive pulmonary disease, unspecified: Secondary | ICD-10-CM

## 2020-04-07 DIAGNOSIS — I5030 Unspecified diastolic (congestive) heart failure: Secondary | ICD-10-CM

## 2020-04-07 MED ORDER — POTASSIUM CHLORIDE ER 10 MEQ PO TBCR: EXTENDED_RELEASE_TABLET | ORAL | 0 refills | Status: DC

## 2020-04-07 MED ORDER — FUROSEMIDE 40 MG PO TABS: 40.0000 mg | ORAL_TABLET | Freq: Every day | ORAL | 0 refills | Status: DC | PRN
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
## 2020-04-14 ENCOUNTER — Other Ambulatory Visit: Payer: Self-pay

## 2020-04-14 ENCOUNTER — Other Ambulatory Visit (INDEPENDENT_AMBULATORY_CARE_PROVIDER_SITE_OTHER): Payer: Medicare HMO

## 2020-04-14 DIAGNOSIS — I5030 Unspecified diastolic (congestive) heart failure: Secondary | ICD-10-CM | POA: Diagnosis not present

## 2020-04-14 DIAGNOSIS — E559 Vitamin D deficiency, unspecified: Secondary | ICD-10-CM | POA: Diagnosis not present

## 2020-04-15 ENCOUNTER — Other Ambulatory Visit (INDEPENDENT_AMBULATORY_CARE_PROVIDER_SITE_OTHER): Payer: Self-pay | Admitting: Nurse Practitioner

## 2020-04-15 DIAGNOSIS — M545 Low back pain, unspecified: Secondary | ICD-10-CM

## 2020-04-15 DIAGNOSIS — E875 Hyperkalemia: Secondary | ICD-10-CM

## 2020-04-15 LAB — COMPLETE METABOLIC PANEL WITH GFR
AG Ratio: 1.2 (calc) (ref 1.0–2.5)
ALT: 14 U/L (ref 6–29)
AST: 20 U/L (ref 10–35)
Albumin: 3.9 g/dL (ref 3.6–5.1)
Alkaline phosphatase (APISO): 70 U/L (ref 37–153)
BUN/Creatinine Ratio: 15 (calc) (ref 6–22)
BUN: 15 mg/dL (ref 7–25)
CO2: 29 mmol/L (ref 20–32)
Calcium: 9.8 mg/dL (ref 8.6–10.4)
Chloride: 99 mmol/L (ref 98–110)
Creat: 0.97 mg/dL — ABNORMAL HIGH (ref 0.60–0.93)
GFR, Est African American: 67 mL/min/{1.73_m2} (ref >=60)
GFR, Est Non African American: 58 mL/min/{1.73_m2} — ABNORMAL LOW (ref >=60)
Globulin: 3.3 g/dL (calc) (ref 1.9–3.7)
Glucose, Bld: 99 mg/dL (ref 65–99)
Potassium: 5.4 mmol/L — ABNORMAL HIGH (ref 3.5–5.3)
Sodium: 138 mmol/L (ref 135–146)
Total Bilirubin: 0.8 mg/dL (ref 0.2–1.2)
Total Protein: 7.2 g/dL (ref 6.1–8.1)

## 2020-04-15 LAB — VITAMIN D 25 HYDROXY (VIT D DEFICIENCY, FRACTURES): Vit D, 25-Hydroxy: 44 ng/mL (ref 30–100)

## 2020-04-15 MED ORDER — IBUPROFEN 800 MG PO TABS: 800.0000 mg | ORAL_TABLET | Freq: Every day | ORAL | 0 refills | Status: DC | PRN

## 2020-04-23 ENCOUNTER — Other Ambulatory Visit: Payer: Self-pay | Admitting: Cardiology

## 2020-05-18 ENCOUNTER — Other Ambulatory Visit (INDEPENDENT_AMBULATORY_CARE_PROVIDER_SITE_OTHER): Payer: Medicare HMO

## 2020-05-18 ENCOUNTER — Telehealth (INDEPENDENT_AMBULATORY_CARE_PROVIDER_SITE_OTHER): Payer: Self-pay | Admitting: Internal Medicine

## 2020-05-18 ENCOUNTER — Other Ambulatory Visit (INDEPENDENT_AMBULATORY_CARE_PROVIDER_SITE_OTHER): Payer: Self-pay | Admitting: Internal Medicine

## 2020-05-18 DIAGNOSIS — E875 Hyperkalemia: Secondary | ICD-10-CM | POA: Diagnosis not present

## 2020-05-18 DIAGNOSIS — R609 Edema, unspecified: Secondary | ICD-10-CM

## 2020-05-18 DIAGNOSIS — I5030 Unspecified diastolic (congestive) heart failure: Secondary | ICD-10-CM

## 2020-05-18 MED ORDER — POTASSIUM CHLORIDE ER 10 MEQ PO TBCR: EXTENDED_RELEASE_TABLET | ORAL | 0 refills | Status: DC

## 2020-05-18 MED ORDER — FUROSEMIDE 40 MG PO TABS: 40.0000 mg | ORAL_TABLET | Freq: Every day | ORAL | 0 refills | Status: DC | PRN

## 2020-05-18 NOTE — Telephone Encounter (Signed)
Done

## 2020-05-19 LAB — COMPLETE METABOLIC PANEL WITH GFR
AG Ratio: 1.2 (calc) (ref 1.0–2.5)
ALT: 15 U/L (ref 6–29)
AST: 19 U/L (ref 10–35)
Albumin: 4 g/dL (ref 3.6–5.1)
Alkaline phosphatase (APISO): 62 U/L (ref 37–153)
BUN: 13 mg/dL (ref 7–25)
CO2: 32 mmol/L (ref 20–32)
Calcium: 9.7 mg/dL (ref 8.6–10.4)
Chloride: 100 mmol/L (ref 98–110)
Creat: 0.89 mg/dL (ref 0.60–0.93)
GFR, Est African American: 75 mL/min/{1.73_m2} (ref >=60)
GFR, Est Non African American: 64 mL/min/{1.73_m2} (ref >=60)
Globulin: 3.3 g/dL (calc) (ref 1.9–3.7)
Glucose, Bld: 109 mg/dL — ABNORMAL HIGH (ref 65–99)
Potassium: 4.6 mmol/L (ref 3.5–5.3)
Sodium: 141 mmol/L (ref 135–146)
Total Bilirubin: 1.2 mg/dL (ref 0.2–1.2)
Total Protein: 7.3 g/dL (ref 6.1–8.1)

## 2020-05-28 ENCOUNTER — Encounter (INDEPENDENT_AMBULATORY_CARE_PROVIDER_SITE_OTHER): Payer: Self-pay | Admitting: Nurse Practitioner

## 2020-05-28 ENCOUNTER — Other Ambulatory Visit: Payer: Self-pay

## 2020-05-28 ENCOUNTER — Other Ambulatory Visit (INDEPENDENT_AMBULATORY_CARE_PROVIDER_SITE_OTHER): Payer: Self-pay | Admitting: Nurse Practitioner

## 2020-05-28 ENCOUNTER — Ambulatory Visit (INDEPENDENT_AMBULATORY_CARE_PROVIDER_SITE_OTHER): Payer: Medicare HMO | Admitting: Nurse Practitioner

## 2020-05-28 VITALS — BP 168/106 | HR 72 | Temp 97.5°F | Ht 67.0 in | Wt 168.8 lb

## 2020-05-28 DIAGNOSIS — I110 Hypertensive heart disease with heart failure: Secondary | ICD-10-CM | POA: Diagnosis not present

## 2020-05-28 DIAGNOSIS — R609 Edema, unspecified: Secondary | ICD-10-CM | POA: Diagnosis not present

## 2020-05-28 DIAGNOSIS — I10 Essential (primary) hypertension: Secondary | ICD-10-CM | POA: Diagnosis not present

## 2020-05-28 DIAGNOSIS — I5030 Unspecified diastolic (congestive) heart failure: Secondary | ICD-10-CM

## 2020-05-28 MED ORDER — FUROSEMIDE 40 MG PO TABS: 60.0000 mg | ORAL_TABLET | Freq: Every day | ORAL | 0 refills | Status: DC | PRN

## 2020-05-28 MED ORDER — OLMESARTAN MEDOXOMIL 20 MG PO TABS: 20.0000 mg | ORAL_TABLET | Freq: Every day | ORAL | 0 refills | Status: DC

## 2020-05-28 NOTE — Patient Instructions (Signed)
Biotene for dry mouth

## 2020-05-28 NOTE — Progress Notes (Signed)
Subjective:  Patient ID: Leslie [name], female    DOB: 03/05/1946  Age: 75 y.o. MRN: [id number]  CC:  Chief Complaint  Patient presents with  . Hypertension    Elevated BP, more elevated here lately, has been going on since December 2021, has been retaining fluid, increased fatigue, swelling in ankles      HPI  This patient arrives today for an acute visit for the above.  She has been experiencing some worsening fluid retention and elevated blood pressures.  This has been going on since December.  She tells me it is intermittent and waxes and wanes.  She has noticed some fluid retention her lower extremities as well as her abdomen.  She has a history of diastolic heart failure.  She does follow-up with cardiology.  She takes 40 mg of furosemide approximately 4 days a week as she works at [employer] 3 days a week and tries to avoid taking her fluid pill to avoid having excessive urination while at work. Last CMP was collected about 2 weeks ago and was within normal limits.  She is also on losartan 50 mg twice a day and metoprolol 25 mg.  She has been taking an extra 50 mg tablet of losartan to try to control her blood pressure.  She does endorse checking her weight daily and tells me its been stable with and approximately 2 pound fluctuation.  Past Medical History:  Diagnosis Date  . Back pain    DDD  . Carotid artery occlusion   . Cough   . Diastolic heart failure ([place])   . Dry skin   . Essential hypertension   . History of bruising easily   . History of colon polyps   . History of migraine   . History of pneumonia   . Hyperlipidemia   . PAD (peripheral artery disease) ([place])   . Panic attack   . Rheumatoid arthritis(714.0)       Family History  Problem Relation Age of Onset  . Hyperlipidemia Mother   . Hypertension Mother   . Cancer Father        Brain  . Diabetes Sister   . Hypertension Sister   . Diabetes Brother   . Heart disease Brother   . Hypertension Brother    . Heart attack Brother   . Colon cancer Neg Hx     Social History   Social History Narrative   Divorced.Lives alone.Works at [employer].   Social History   Tobacco Use  . Smoking status: Current Every Day Smoker    Packs/day: 0.50    Years: 50.00    Pack years: 25.00    Types: Cigarettes    Start date: 63  . Smokeless tobacco: Never Used  . Tobacco comment: pt states that she uses nic patches  Substance Use Topics  . Alcohol use: Not Currently    Comment: occassionally     Current Meds  Medication Sig  . albuterol (VENTOLIN HFA) 108 (90 Base) MCG/ACT inhaler INHALE 2 INHALATIONS INTO THE LUNGS EVERY 6 HOURS AS NEEDED FOR WHEEZING (Patient taking differently: Inhale 2 puffs into the lungs every 6 (six) hours as needed for wheezing.)  . aspirin EC 81 MG tablet Take 81 mg by mouth daily.  . Cholecalciferol (VITAMIN D-3) 125 MCG (5000 UT) TABS Take 2 tablets by mouth daily.  . cilostazol (PLETAL) 100 MG tablet TAKE 1 TABLET(100 MG) BY MOUTH TWICE DAILY  . ibuprofen (ADVIL) 800 MG  tablet Take 1 tablet (800 mg total) by mouth daily as needed for moderate pain.  . metoprolol succinate (TOPROL-XL) 25 MG 24 hr tablet TAKE 1 TABLET (25 MG TOTAL) BY MOUTH DAILY.  . pantoprazole (PROTONIX) 40 MG tablet TAKE 1 TABLET EVERY DAY  . potassium chloride (KLOR-CON) 10 MEQ tablet TAKE 1 TABLET BY MOUTH EVERY DAY WHEN YOU TAKE FUROSEMIDE(LASIX)  . [DISCONTINUED] furosemide (LASIX) 40 MG tablet Take 1 tablet (40 mg total) by mouth daily as needed for fluid or edema.  . [DISCONTINUED] losartan (COZAAR) 50 MG tablet TAKE 1 TABLET (50 MG TOTAL) BY MOUTH 2 (TWO) TIMES DAILY.  . [DISCONTINUED] olmesartan (BENICAR) 20 MG tablet Take 1 tablet (20 mg total) by mouth daily.    ROS:  Review of Systems  Constitutional: Negative for weight loss (-) weight gain.  Respiratory: Positive for shortness of breath (with activity).   Cardiovascular: Positive for leg swelling. Negative for chest pain and  orthopnea.     Objective:   Today's Vitals: BP (!) 168/106   Pulse 72   Temp (!) 97.5 F (36.4 C) (Temporal)   Ht 5\' 7"  (1.702 m)   Wt 168 lb 12.8 oz (76.6 kg)   SpO2 93%   BMI 26.44 kg/m  Vitals with BMI 05/28/2020 04/07/2020 03/09/2020  Height 5\' 7"  5\' 7"  -  Weight 168 lbs 13 oz 161 lbs -  BMI 32.20 25.42 -  Systolic 706 237 628  Diastolic 315 82 70  Pulse 72 - 83     Physical Exam Vitals reviewed.  Constitutional:      General: She is not in acute distress.    Appearance: Normal appearance.  HENT:     Head: Normocephalic and atraumatic.  Neck:     Vascular: No carotid bruit.  Cardiovascular:     Rate and Rhythm: Normal rate and regular rhythm.     Pulses: Normal pulses.     Heart sounds: Normal heart sounds.  Pulmonary:     Effort: Pulmonary effort is normal.     Breath sounds: Normal breath sounds.  Musculoskeletal:     Right lower leg: 1+ Pitting Edema present.     Left lower leg: 1+ Pitting Edema present.  Skin:    General: Skin is warm and dry.  Neurological:     General: No focal deficit present.     Mental Status: She is alert and oriented to person, place, and time.  Psychiatric:        Mood and Affect: Mood normal.        Behavior: Behavior normal.        Judgment: Judgment normal.          Assessment and Plan   1. Essential hypertension   2. Swelling   3. Diastolic congestive heart failure, unspecified HF chronicity ([place])      Plan: 1.-3.  We will make medication adjustments and I have increased her furosemide to 60 mg by mouth daily as needed.  We will also stop her losartan and change her to olmesartan.  May consider adding spironolactone if blood pressure is not improved with these changes.  I encouraged her to call her cardiologist and get a follow-up appointment scheduled with them as well.   Of note, she tells me she is no longer taking her cholesterol medication would like to have her lipid panel monitored.  We will consider  checking this at next office visit when we do blood work.  Tests ordered No orders of  the defined types were placed in this encounter.     Meds ordered this encounter  Medications  . furosemide (LASIX) 40 MG tablet    Sig: Take 1.5 tablets (60 mg total) by mouth daily as needed for fluid or edema.    Dispense:  135 tablet    Refill:  0    **Patient requests 90 days supply**    Order Specific Question:   Supervising Provider    Answer:   Hurshel Party C [[id number]]  . DISCONTD: olmesartan (BENICAR) 20 MG tablet    Sig: Take 1 tablet (20 mg total) by mouth daily.    Dispense:  30 tablet    Refill:  0    Order Specific Question:   Supervising Provider    Answer:   Doree Albee [[id number]]    Patient to follow-up in 2 weeks for blood pressure check as well as to check CMP and patient would like to have lipid panel checked as well.  Ailene Ards, NP

## 2020-06-10 ENCOUNTER — Ambulatory Visit (INDEPENDENT_AMBULATORY_CARE_PROVIDER_SITE_OTHER): Payer: Medicare HMO | Admitting: Internal Medicine

## 2020-06-11 ENCOUNTER — Ambulatory Visit (INDEPENDENT_AMBULATORY_CARE_PROVIDER_SITE_OTHER): Payer: Medicare HMO | Admitting: Nurse Practitioner

## 2020-06-18 ENCOUNTER — Other Ambulatory Visit (INDEPENDENT_AMBULATORY_CARE_PROVIDER_SITE_OTHER): Payer: Self-pay | Admitting: Nurse Practitioner

## 2020-06-18 DIAGNOSIS — M545 Low back pain, unspecified: Secondary | ICD-10-CM

## 2020-07-02 ENCOUNTER — Other Ambulatory Visit (INDEPENDENT_AMBULATORY_CARE_PROVIDER_SITE_OTHER): Payer: Self-pay | Admitting: Internal Medicine

## 2020-07-08 ENCOUNTER — Other Ambulatory Visit: Payer: Self-pay

## 2020-07-08 ENCOUNTER — Encounter (INDEPENDENT_AMBULATORY_CARE_PROVIDER_SITE_OTHER): Payer: Self-pay | Admitting: Nurse Practitioner

## 2020-07-08 ENCOUNTER — Telehealth (INDEPENDENT_AMBULATORY_CARE_PROVIDER_SITE_OTHER): Payer: Self-pay | Admitting: Nurse Practitioner

## 2020-07-08 ENCOUNTER — Ambulatory Visit (INDEPENDENT_AMBULATORY_CARE_PROVIDER_SITE_OTHER): Payer: Medicare HMO | Admitting: Nurse Practitioner

## 2020-07-08 VITALS — BP 152/98 | HR 87 | Temp 97.2°F | Ht 67.0 in | Wt 164.2 lb

## 2020-07-08 DIAGNOSIS — I739 Peripheral vascular disease, unspecified: Secondary | ICD-10-CM

## 2020-07-08 DIAGNOSIS — R059 Cough, unspecified: Secondary | ICD-10-CM | POA: Diagnosis not present

## 2020-07-08 DIAGNOSIS — I10 Essential (primary) hypertension: Secondary | ICD-10-CM | POA: Diagnosis not present

## 2020-07-08 DIAGNOSIS — R609 Edema, unspecified: Secondary | ICD-10-CM

## 2020-07-08 DIAGNOSIS — E782 Mixed hyperlipidemia: Secondary | ICD-10-CM | POA: Diagnosis not present

## 2020-07-08 DIAGNOSIS — I5030 Unspecified diastolic (congestive) heart failure: Secondary | ICD-10-CM

## 2020-07-08 DIAGNOSIS — R0789 Other chest pain: Secondary | ICD-10-CM

## 2020-07-08 DIAGNOSIS — R1011 Right upper quadrant pain: Secondary | ICD-10-CM

## 2020-07-08 DIAGNOSIS — I110 Hypertensive heart disease with heart failure: Secondary | ICD-10-CM | POA: Diagnosis not present

## 2020-07-08 MED ORDER — CILOSTAZOL 100 MG PO TABS: ORAL_TABLET | ORAL | 0 refills | Status: DC

## 2020-07-08 MED ORDER — HYDROCHLOROTHIAZIDE 12.5 MG PO CAPS: 12.5000 mg | ORAL_CAPSULE | Freq: Every day | ORAL | 0 refills | Status: DC

## 2020-07-08 MED ORDER — BENZONATATE 100 MG PO CAPS: 100.0000 mg | ORAL_CAPSULE | Freq: Two times a day (BID) | ORAL | 0 refills | Status: DC | PRN

## 2020-07-08 NOTE — Telephone Encounter (Signed)
Ok will do.

## 2020-07-08 NOTE — Progress Notes (Signed)
Subjective:  Patient ID: Leslie [name], female    DOB: 10-Nov-1945  Age: 75 y.o. MRN: [id number]  CC:  Chief Complaint  Patient presents with  . Follow-up    Feels an uncomfortable pressure just below her chest, worse when having a bowel, states that she just does not feel right  . Abdominal Pain  . Hypertension  . Hyperlipidemia  . Congestive Heart Failure      HPI  This patient arrives today for the above.  Abdominal pain: She reports a pressure that she describes as a "ball" sensation that comes and goes to her epigastric area.  She tells me that when it occurs it feels like a sudden pain and will gradually go away.  She notices it mostly when she feels the urge to have a bowel movement, it also make her a bit short of breath at times.  Hypertension/hyperlipidemia/congestive heart failure: She continues on losartan, metoprolol, and furosemide for treatment of her hypertension, congestive heart failure, and lower extremity swelling.  She has stopped her rosuvastatin previously and is due to have lipid panel rechecked for this.  She is also on a low-dose aspirin and Pletal.  She tells me she does weigh herself daily and feels that her weight has remained stable.  She has noted some swelling or tightening in her lower extremities at which point she will take frusemide as needed.  She used to be on hydrochlorothiazide but this was stopped due to hyponatremia, she tells me since stopping her hydrochlorothiazide she had a hard time controlling her swelling and blood pressure.  Past Medical History:  Diagnosis Date  . Back pain    DDD  . Carotid artery occlusion   . Cough   . Diastolic heart failure ([place])   . Dry skin   . Essential hypertension   . History of bruising easily   . History of colon polyps   . History of migraine   . History of pneumonia   . Hyperlipidemia   . PAD (peripheral artery disease) ([place])   . Panic attack   . Rheumatoid arthritis(714.0)       Family  History  Problem Relation Age of Onset  . Hyperlipidemia Mother   . Hypertension Mother   . Cancer Father        Brain  . Diabetes Sister   . Hypertension Sister   . Diabetes Brother   . Heart disease Brother   . Hypertension Brother   . Heart attack Brother   . Colon cancer Neg Hx     Social History   Social History Narrative   Divorced.Lives alone.Works at [employer].   Social History   Tobacco Use  . Smoking status: Current Every Day Smoker    Packs/day: 0.50    Years: 50.00    Pack years: 25.00    Types: Cigarettes    Start date: 33  . Smokeless tobacco: Never Used  . Tobacco comment: pt states that she uses nic patches  Substance Use Topics  . Alcohol use: Not Currently    Comment: occassionally     Current Meds  Medication Sig  . albuterol (VENTOLIN HFA) 108 (90 Base) MCG/ACT inhaler INHALE 2 INHALATIONS INTO THE LUNGS EVERY 6 HOURS AS NEEDED FOR WHEEZING (Patient taking differently: Inhale 2 puffs into the lungs every 6 (six) hours as needed for wheezing.)  . aspirin EC 81 MG tablet Take 81 mg by mouth daily.  . benzonatate (TESSALON) 100 MG capsule  Take 1 capsule (100 mg total) by mouth 2 (two) times daily as needed for cough.  . celecoxib (CELEBREX) 100 MG capsule Take by mouth.  . Cholecalciferol (VITAMIN D-3) 125 MCG (5000 UT) TABS Take 2 tablets by mouth daily.  . furosemide (LASIX) 40 MG tablet Take 1.5 tablets (60 mg total) by mouth daily as needed for fluid or edema.  . hydrochlorothiazide (MICROZIDE) 12.5 MG capsule Take 1 capsule (12.5 mg total) by mouth daily.  . IBU 800 MG tablet TAKE 1 TABLET EVERY DAY AS NEEDED FOR MODERATE PAIN.  . metoprolol succinate (TOPROL-XL) 25 MG 24 hr tablet TAKE 1 TABLET (25 MG TOTAL) BY MOUTH DAILY.  Marland Kitchen olmesartan (BENICAR) 20 MG tablet TAKE 1 TABLET(20 MG) BY MOUTH DAILY  . pantoprazole (PROTONIX) 40 MG tablet TAKE 1 TABLET EVERY DAY  . potassium chloride (KLOR-CON) 10 MEQ tablet TAKE 1 TABLET BY MOUTH EVERY DAY  WHEN YOU TAKE FUROSEMIDE(LASIX)  . [DISCONTINUED] cilostazol (PLETAL) 100 MG tablet TAKE 1 TABLET(100 MG) BY MOUTH TWICE DAILY    ROS:  Review of Systems  Respiratory: Positive for cough and shortness of breath (when she experiences a "ball" sensation to her epigastric area). Negative for sputum production.   Cardiovascular: Positive for chest pain and leg swelling. Negative for palpitations, orthopnea and PND.  Gastrointestinal: Positive for abdominal pain (intermittent epigastric pain and "ball"/masslike sensation to epigastric area).     Objective:   Today's Vitals: BP (!) 152/98   Pulse 87   Temp (!) 97.2 F (36.2 C) (Temporal)   Ht 5' 7"  (1.702 m)   Wt 164 lb 3.2 oz (74.5 kg)   SpO2 90%   BMI 25.72 kg/m  Vitals with BMI 07/08/2020 05/28/2020 04/07/2020  Height 5' 7"  5' 7"  5' 7"   Weight 164 lbs 3 oz 168 lbs 13 oz 161 lbs  BMI 25.71 28.83 37.44  Systolic 514 604 799  Diastolic 98 872 82  Pulse 87 72 -     Physical Exam Vitals reviewed.  Constitutional:      General: She is not in acute distress.    Appearance: Normal appearance.  HENT:     Head: Normocephalic and atraumatic.  Neck:     Vascular: No carotid bruit.  Cardiovascular:     Rate and Rhythm: Normal rate and regular rhythm.     Pulses: Normal pulses.     Heart sounds: Normal heart sounds.  Pulmonary:     Effort: Pulmonary effort is normal.     Breath sounds: Normal breath sounds.  Abdominal:     General: Abdomen is flat. Bowel sounds are normal.     Palpations: Abdomen is soft.     Tenderness: There is abdominal tenderness in the right upper quadrant and epigastric area.    Musculoskeletal:     Right lower leg: 1+ Edema present.     Left lower leg: 1+ Edema present.  Skin:    General: Skin is warm and dry.  Neurological:     General: No focal deficit present.     Mental Status: She is alert and oriented to person, place, and time.  Psychiatric:        Mood and Affect: Mood normal.         Behavior: Behavior normal.        Judgment: Judgment normal.        EKG: Sinus rhythm signs of atrial enlargement, and signs of old anteroseptal infarct  Assessment and Plan   1. Chest pressure  2. Right upper quadrant abdominal pain   3. Mixed hyperlipidemia   4. Swelling   5. Diastolic congestive heart failure, unspecified HF chronicity ([place])   6. Essential hypertension   7. PAD, high grade RICA by MRI 10/03/11   8. Cough      Plan: 1.,  2.  Unclear etiology.  There was an area on her abdominal exam in the right upper quadrant that appeared to be tight on palpation, the area had poorly defined margins it was unclear as to whether this felt more like a mass or may be hepatomegaly.  Will order CT scan of the abdomen for further evaluation.  3.  Will check lipid panel to monitor since stopping her rosuvastatin. 4.-6.  We will restart hydrochlorothiazide to hopefully help with swelling as well as improve blood pressure control.  We will bring her back in a couple weeks to recheck metabolic panel and see how her blood pressure as well as how the swelling is.  She continue taking her furosemide as needed. 7.  We will refill her Pletal per her request today. 8.  She is wondering if she can take something for cough suppression we will order Ladona Ridgel that she can take as needed.   Tests ordered Orders Placed This Encounter  Procedures  . CT ABDOMEN WO CONTRAST  . Lipid Panel  . CMP with eGFR([facility])  . EKG 12-Lead      Meds ordered this encounter  Medications  . hydrochlorothiazide (MICROZIDE) 12.5 MG capsule    Sig: Take 1 capsule (12.5 mg total) by mouth daily.    Dispense:  90 capsule    Refill:  0    Order Specific Question:   Supervising Provider    Answer:   Hurshel Party C [[id number]]  . cilostazol (PLETAL) 100 MG tablet    Sig: TAKE 1 TABLET(100 MG) BY MOUTH TWICE DAILY    Dispense:  180 tablet    Refill:  0    Order Specific Question:   Supervising Provider     Answer:   Hurshel Party C [[id number]]  . benzonatate (TESSALON) 100 MG capsule    Sig: Take 1 capsule (100 mg total) by mouth 2 (two) times daily as needed for cough.    Dispense:  20 capsule    Refill:  0    Order Specific Question:   Supervising Provider    Answer:   Doree Albee [[id number]]    Patient to follow-up in 2 weeks or sooner as needed.  Ailene Ards, NP

## 2020-07-08 NOTE — Patient Instructions (Signed)
Please call Dr. Domenic Polite to schedule your follow-up with him in August of this year. [phone].

## 2020-07-08 NOTE — Telephone Encounter (Signed)
CT scan of the abdomen ordered today.  Please work on getting this approved by insurance and scheduled.  Thank you.

## 2020-07-09 ENCOUNTER — Other Ambulatory Visit (INDEPENDENT_AMBULATORY_CARE_PROVIDER_SITE_OTHER): Payer: Self-pay | Admitting: Nurse Practitioner

## 2020-07-09 ENCOUNTER — Telehealth (INDEPENDENT_AMBULATORY_CARE_PROVIDER_SITE_OTHER): Payer: Self-pay

## 2020-07-09 DIAGNOSIS — I10 Essential (primary) hypertension: Secondary | ICD-10-CM

## 2020-07-09 DIAGNOSIS — R609 Edema, unspecified: Secondary | ICD-10-CM

## 2020-07-09 DIAGNOSIS — E785 Hyperlipidemia, unspecified: Secondary | ICD-10-CM

## 2020-07-09 DIAGNOSIS — I5030 Unspecified diastolic (congestive) heart failure: Secondary | ICD-10-CM

## 2020-07-09 LAB — COMPLETE METABOLIC PANEL WITH GFR
AG Ratio: 1 (calc) (ref 1.0–2.5)
ALT: 15 U/L (ref 6–29)
AST: 23 U/L (ref 10–35)
Albumin: 3.6 g/dL (ref 3.6–5.1)
Alkaline phosphatase (APISO): 70 U/L (ref 37–153)
BUN: 12 mg/dL (ref 7–25)
CO2: 31 mmol/L (ref 20–32)
Calcium: 8.9 mg/dL (ref 8.6–10.4)
Chloride: 97 mmol/L — ABNORMAL LOW (ref 98–110)
Creat: 0.93 mg/dL (ref 0.60–0.93)
GFR, Est African American: 71 mL/min/{1.73_m2} (ref >=60)
GFR, Est Non African American: 61 mL/min/{1.73_m2} (ref >=60)
Globulin: 3.5 g/dL (calc) (ref 1.9–3.7)
Glucose, Bld: 110 mg/dL (ref 65–139)
Potassium: 4.2 mmol/L (ref 3.5–5.3)
Sodium: 138 mmol/L (ref 135–146)
Total Bilirubin: 1.7 mg/dL — ABNORMAL HIGH (ref 0.2–1.2)
Total Protein: 7.1 g/dL (ref 6.1–8.1)

## 2020-07-09 LAB — LIPID PANEL
Cholesterol: 195 mg/dL (ref <200)
HDL: 31 mg/dL — ABNORMAL LOW (ref >=50)
LDL Cholesterol (Calc): 140 mg/dL (calc) — ABNORMAL HIGH
Non-HDL Cholesterol (Calc): 164 mg/dL (calc) — ABNORMAL HIGH (ref <130)
Total CHOL/HDL Ratio: 6.3 (calc) — ABNORMAL HIGH (ref <5.0)
Triglycerides: 120 mg/dL (ref <150)

## 2020-07-09 MED ORDER — HYDROCHLOROTHIAZIDE 12.5 MG PO CAPS: 12.5000 mg | ORAL_CAPSULE | Freq: Every day | ORAL | 0 refills | Status: DC

## 2020-07-09 MED ORDER — ROSUVASTATIN CALCIUM 10 MG PO TABS: 10.0000 mg | ORAL_TABLET | Freq: Every day | ORAL | 0 refills | Status: DC

## 2020-07-09 NOTE — Telephone Encounter (Signed)
Patient called and stated that she looked and did not have any of the medication that Judson Roch discussed with her yesterday. Patient asked for a 90 day refill of Hydrochlorothiazide and please send to [facility].

## 2020-07-09 NOTE — Telephone Encounter (Signed)
I sent a prescription to [facility] yesterday. Did she want me to send a 30-day supply to a local pharmacy so she can start it today? If not, I think the mail order will send it to her within the next few days.

## 2020-07-09 NOTE — Telephone Encounter (Signed)
Called patient and she stated that a 30 day Rx sent to [place] would be great! Patient said thank you!

## 2020-07-09 NOTE — Telephone Encounter (Signed)
Okay, I have sent a 30-day supply to [place] of her hydrochlorothiazide so she can start this within the next day or so while she waits for delivery from her mail order pharmacy.

## 2020-07-24 ENCOUNTER — Ambulatory Visit (HOSPITAL_COMMUNITY): Payer: Medicare HMO

## 2020-07-29 ENCOUNTER — Ambulatory Visit (INDEPENDENT_AMBULATORY_CARE_PROVIDER_SITE_OTHER): Payer: Medicare HMO | Admitting: Nurse Practitioner

## 2020-07-29 ENCOUNTER — Other Ambulatory Visit: Payer: Self-pay

## 2020-07-29 ENCOUNTER — Encounter (INDEPENDENT_AMBULATORY_CARE_PROVIDER_SITE_OTHER): Payer: Self-pay | Admitting: Nurse Practitioner

## 2020-07-29 VITALS — BP 140/92 | HR 82 | Temp 96.3°F | Ht 67.0 in | Wt 156.0 lb

## 2020-07-29 DIAGNOSIS — I10 Essential (primary) hypertension: Secondary | ICD-10-CM | POA: Diagnosis not present

## 2020-07-29 NOTE — Progress Notes (Signed)
Subjective:  Patient ID: Leslie [name], female    DOB: 1946/03/20  Age: 75 y.o. MRN: [id number]  CC:  Chief Complaint  Patient presents with  . Follow-up    Doing well, feels like Leslie [name] is doing better, no concern  . Hypertension      HPI  This patient arrives today for the above.  Hypertension: We we added her hydrochlorothiazide at last office visit.  Leslie [name] does have a history of hyponatremia, so we will check CMP today to monitor for this.  Leslie [name] is also on olmesartan and metoprolol.  Leslie [name] tells me Leslie [name] does monitor her blood pressure at home and tells me that its been much better at home that has been reading at the doctor's office lately.  Leslie [name] also follows with cardiology for treatment of her heart failure.  Leslie [name] tells me since having the hydrochlorothiazide the swelling in her legs is much improved, the pain in her abdomen has improved, and overall Leslie [name] feels much better and more energetic.  Past Medical History:  Diagnosis Date  . Back pain    DDD  . Carotid artery occlusion   . Cough   . Diastolic heart failure ([place])   . Dry skin   . Essential hypertension   . History of bruising easily   . History of colon polyps   . History of migraine   . History of pneumonia   . Hyperlipidemia   . PAD (peripheral artery disease) ([place])   . Panic attack   . Rheumatoid arthritis(714.0)       Family History  Problem Relation Age of Onset  . Hyperlipidemia Mother   . Hypertension Mother   . Cancer Father        Brain  . Diabetes Sister   . Hypertension Sister   . Diabetes Brother   . Heart disease Brother   . Hypertension Brother   . Heart attack Brother   . Colon cancer Neg Hx     Social History   Social History Narrative   Divorced.Lives alone.Works at [employer].   Social History   Tobacco Use  . Smoking status: Current Every Day Smoker    Packs/day: 0.50    Years: 50.00    Pack years: 25.00    Types: Cigarettes    Start date: 77  . Smokeless tobacco: Never  Used  . Tobacco comment: pt states that Leslie [name] uses nic patches  Substance Use Topics  . Alcohol use: Not Currently    Comment: occassionally     Current Meds  Medication Sig  . albuterol (VENTOLIN HFA) 108 (90 Base) MCG/ACT inhaler INHALE 2 INHALATIONS INTO THE LUNGS EVERY 6 HOURS AS NEEDED FOR WHEEZING (Patient taking differently: Inhale 2 puffs into the lungs every 6 (six) hours as needed for wheezing.)  . aspirin EC 81 MG tablet Take 81 mg by mouth daily.  . celecoxib (CELEBREX) 100 MG capsule Take by mouth.  . Cholecalciferol (VITAMIN D-3) 125 MCG (5000 UT) TABS Take 2 tablets by mouth daily.  . cilostazol (PLETAL) 100 MG tablet TAKE 1 TABLET(100 MG) BY MOUTH TWICE DAILY  . hydrochlorothiazide (MICROZIDE) 12.5 MG capsule Take 1 capsule (12.5 mg total) by mouth daily.  . IBU 800 MG tablet TAKE 1 TABLET EVERY DAY AS NEEDED FOR MODERATE PAIN.  Marland Kitchen losartan (COZAAR) 50 MG tablet   . metoprolol succinate (TOPROL-XL) 25 MG 24 hr tablet TAKE 1 TABLET (25 MG TOTAL) BY MOUTH DAILY.  Marland Kitchen olmesartan (BENICAR) 20  MG tablet TAKE 1 TABLET(20 MG) BY MOUTH DAILY  . pantoprazole (PROTONIX) 40 MG tablet TAKE 1 TABLET EVERY DAY  . potassium chloride (KLOR-CON) 10 MEQ tablet TAKE 1 TABLET BY MOUTH EVERY DAY WHEN YOU TAKE FUROSEMIDE(LASIX)  . rosuvastatin (CRESTOR) 10 MG tablet Take 1 tablet (10 mg total) by mouth daily.    ROS:  Review of Systems  Respiratory: Negative for shortness of breath.   Cardiovascular: Negative for chest pain and leg swelling.  Neurological: Negative for dizziness and headaches.     Objective:   Today's Vitals: BP (!) 140/92   Pulse 82   Temp (!) 96.3 F (35.7 C) (Temporal)   Ht $R'5\' 7"'wo$  (1.702 m)   Wt 156 lb (70.8 kg)   SpO2 93%   BMI 24.43 kg/m  Vitals with BMI 07/29/2020 07/08/2020 05/28/2020  Height $Remov'5\' 7"'ZqwFpI$  $Remove'5\' 7"'kXeNmUy$  $RemoveB'5\' 7"'RahTMYLR$   Weight 156 lbs 164 lbs 3 oz 168 lbs 13 oz  BMI 24.43 27.25 36.64  Systolic 403 474 259  Diastolic 92 98 563  Pulse 82 87 72     Physical  Exam Vitals reviewed.  Constitutional:      General: Leslie [name] is not in acute distress.    Appearance: Normal appearance.  HENT:     Head: Normocephalic and atraumatic.  Neck:     Vascular: No carotid bruit.  Cardiovascular:     Rate and Rhythm: Normal rate and regular rhythm.     Pulses: Normal pulses.     Heart sounds: Murmur heard.    Pulmonary:     Effort: Pulmonary effort is normal.     Breath sounds: Normal breath sounds.  Skin:    General: Skin is warm and dry.  Neurological:     General: No focal deficit present.     Mental Status: Leslie [name] is alert and oriented to person, place, and time.  Psychiatric:        Mood and Affect: Mood normal.        Behavior: Behavior normal.        Judgment: Judgment normal.          Assessment and Plan   1. Essential hypertension      Plan: 1.  Blood pressure is above goal in the office today, however Leslie [name] tells me is much better at home.  For now we will just monitor closely may consider medication adjustments if blood pressure remains elevated in subsequent visits.  Leslie [name] feels much better on hydrochlorothiazide so we will keep her on this for now we will check metabolic panel today as well.  Of note, Leslie [name] did mention that her weight has been dropping but Leslie [name] tells me that her dry weight is around 156 to 157 pounds that is what Leslie [name] is today.  We did discuss possibly having her come back in 6 weeks to closely monitor her weight to make sure Leslie [name] still continue to lose weight, however Leslie [name] tells me Leslie [name] feels comfortable monitoring this at home and if Leslie [name] continues to lose weight over the next 6 weeks to let me know.  For now should continue on medications as currently prescribed we will follow-up in 3 months, if metabolic panel shows hyponatremia may need to make medication adjustments and bring her in for 3 months.   Tests ordered Orders Placed This Encounter  Procedures  . CMP with eGFR([facility])      No orders of the defined types were  placed in this encounter.   Patient to follow-up in 3  months or sooner as needed.  Ailene Ards, NP

## 2020-07-29 NOTE — Patient Instructions (Signed)
Heart Failure, Diagnosis  Heart failure means that your heart is not able to pump blood in the right way. This makes it hard for your body to work well. Heart failure is usually a long-term (chronic) condition. You must take good care of yourself and follow your treatment plan from your doctor. What are the causes?  High blood pressure.  Buildup of cholesterol and fat in the arteries.  Heart attack. This injures the heart muscle.  Heart valves that do not open and close properly.  Damage of the heart muscle. This is also called cardiomyopathy.  Infection of the heart muscle. This is also called myocarditis.  Lung disease. What increases the risk?  Getting older. The risk of heart failure goes up as a person ages.  Being overweight.  Being female.  Use tobacco or nicotine products.  Abusing alcohol or drugs.  Having taken medicines that can damage the heart.  Having any of these conditions: ? Diabetes. ? Abnormal heart rhythms. ? Thyroid problems. ? Low blood counts (anemia).  Having a family history of heart failure. What are the signs or symptoms?  Shortness of breath.  Coughing.  Swelling of the feet, ankles, legs, or belly.  Losing or gaining weight for no reason.  Trouble breathing.  Waking from sleep because of the need to sit up and get more air.  Fast heartbeat.  Being very tired.  Feeling dizzy, or feeling like you may pass out (faint).  Having no desire to eat.  Feeling like you may vomit (nauseous).  Peeing (urinating) more at night.  Feeling confused. How is this treated? This condition may be treated with:  Medicines. These can be given to treat blood pressure and to make the heart muscles stronger.  Changes in your daily life. These may include: ? Eating a healthy diet. ? Staying at a healthy body weight. ? Quitting tobacco, alcohol, and drug use. ? Doing exercises. ? Participating in a cardiac rehabilitation program. This program  helps you improve your health through exercise, education, and counseling.  Surgery. Surgery can be done to open blocked valves, or to put devices in the heart, such as pacemakers.  A donor heart (heart transplant). You will receive a healthy heart from a donor. Follow these instructions at home:  Treat other conditions as told by your doctor. These may include high blood pressure, diabetes, thyroid disease, or abnormal heart rhythms.  Learn as much as you can about heart failure.  Get support as you need it.  Keep all follow-up visits. Summary  Heart failure means that your heart is not able to pump blood in the right way.  This condition is often caused by high blood pressure, heart attack, or damage of the heart muscle.  Symptoms of this condition include shortness of breath and swelling of the feet, ankles, legs, or belly. You may also feel very tired or feel like you may vomit.  You may be treated with medicines, surgery, or changes in your daily life.  Treat other health conditions as told by your doctor. This information is not intended to replace advice given to you by your health care provider. Make sure you discuss any questions you have with your health care provider. Document Revised: 09/28/2019 Document Reviewed: 09/28/2019 Elsevier Patient Education  2021 Elsevier Inc.  

## 2020-07-30 ENCOUNTER — Other Ambulatory Visit (INDEPENDENT_AMBULATORY_CARE_PROVIDER_SITE_OTHER): Payer: Self-pay | Admitting: Nurse Practitioner

## 2020-07-30 DIAGNOSIS — E871 Hypo-osmolality and hyponatremia: Secondary | ICD-10-CM

## 2020-07-30 DIAGNOSIS — I5030 Unspecified diastolic (congestive) heart failure: Secondary | ICD-10-CM

## 2020-07-30 LAB — COMPLETE METABOLIC PANEL WITH GFR
AG Ratio: 1.2 (calc) (ref 1.0–2.5)
ALT: 13 U/L (ref 6–29)
AST: 20 U/L (ref 10–35)
Albumin: 3.8 g/dL (ref 3.6–5.1)
Alkaline phosphatase (APISO): 67 U/L (ref 37–153)
BUN/Creatinine Ratio: 12 (calc) (ref 6–22)
BUN: 12 mg/dL (ref 7–25)
CO2: 32 mmol/L (ref 20–32)
Calcium: 9.4 mg/dL (ref 8.6–10.4)
Chloride: 94 mmol/L — ABNORMAL LOW (ref 98–110)
Creat: 1.03 mg/dL — ABNORMAL HIGH (ref 0.60–0.93)
GFR, Est African American: 62 mL/min/{1.73_m2} (ref >=60)
GFR, Est Non African American: 54 mL/min/{1.73_m2} — ABNORMAL LOW (ref >=60)
Globulin: 3.1 g/dL (calc) (ref 1.9–3.7)
Glucose, Bld: 110 mg/dL (ref 65–139)
Potassium: 4.9 mmol/L (ref 3.5–5.3)
Sodium: 133 mmol/L — ABNORMAL LOW (ref 135–146)
Total Bilirubin: 0.9 mg/dL (ref 0.2–1.2)
Total Protein: 6.9 g/dL (ref 6.1–8.1)

## 2020-08-06 ENCOUNTER — Other Ambulatory Visit: Payer: Self-pay

## 2020-08-06 DIAGNOSIS — I6523 Occlusion and stenosis of bilateral carotid arteries: Secondary | ICD-10-CM

## 2020-08-20 ENCOUNTER — Ambulatory Visit (HOSPITAL_COMMUNITY): Payer: Medicare HMO

## 2020-08-24 ENCOUNTER — Other Ambulatory Visit (INDEPENDENT_AMBULATORY_CARE_PROVIDER_SITE_OTHER): Payer: Self-pay | Admitting: Nurse Practitioner

## 2020-08-24 DIAGNOSIS — M545 Low back pain, unspecified: Secondary | ICD-10-CM

## 2020-08-31 ENCOUNTER — Other Ambulatory Visit (INDEPENDENT_AMBULATORY_CARE_PROVIDER_SITE_OTHER): Payer: Medicare HMO

## 2020-08-31 ENCOUNTER — Other Ambulatory Visit: Payer: Self-pay

## 2020-08-31 DIAGNOSIS — E871 Hypo-osmolality and hyponatremia: Secondary | ICD-10-CM | POA: Diagnosis not present

## 2020-08-31 DIAGNOSIS — I5030 Unspecified diastolic (congestive) heart failure: Secondary | ICD-10-CM | POA: Diagnosis not present

## 2020-09-01 ENCOUNTER — Other Ambulatory Visit (INDEPENDENT_AMBULATORY_CARE_PROVIDER_SITE_OTHER): Payer: Self-pay | Admitting: Nurse Practitioner

## 2020-09-01 DIAGNOSIS — E871 Hypo-osmolality and hyponatremia: Secondary | ICD-10-CM

## 2020-09-01 LAB — COMPLETE METABOLIC PANEL WITH GFR
AG Ratio: 1.1 (calc) (ref 1.0–2.5)
ALT: 14 U/L (ref 6–29)
AST: 20 U/L (ref 10–35)
Albumin: 3.9 g/dL (ref 3.6–5.1)
Alkaline phosphatase (APISO): 66 U/L (ref 37–153)
BUN: 14 mg/dL (ref 7–25)
CO2: 31 mmol/L (ref 20–32)
Calcium: 10 mg/dL (ref 8.6–10.4)
Chloride: 94 mmol/L — ABNORMAL LOW (ref 98–110)
Creat: 0.91 mg/dL (ref 0.60–0.93)
GFR, Est African American: 73 mL/min/{1.73_m2} (ref >=60)
GFR, Est Non African American: 63 mL/min/{1.73_m2} (ref >=60)
Globulin: 3.5 g/dL (calc) (ref 1.9–3.7)
Glucose, Bld: 98 mg/dL (ref 65–99)
Potassium: 4.9 mmol/L (ref 3.5–5.3)
Sodium: 132 mmol/L — ABNORMAL LOW (ref 135–146)
Total Bilirubin: 0.6 mg/dL (ref 0.2–1.2)
Total Protein: 7.4 g/dL (ref 6.1–8.1)

## 2020-09-02 ENCOUNTER — Other Ambulatory Visit (INDEPENDENT_AMBULATORY_CARE_PROVIDER_SITE_OTHER): Payer: Self-pay | Admitting: Nurse Practitioner

## 2020-09-02 DIAGNOSIS — I10 Essential (primary) hypertension: Secondary | ICD-10-CM

## 2020-09-02 DIAGNOSIS — I5030 Unspecified diastolic (congestive) heart failure: Secondary | ICD-10-CM

## 2020-09-02 MED ORDER — HYDROCHLOROTHIAZIDE 12.5 MG PO TABS: 6.2500 mg | ORAL_TABLET | Freq: Every day | ORAL | 1 refills | Status: DC

## 2020-09-02 NOTE — Progress Notes (Signed)
Called patient and gave her the message. Patient verbalized an understanding and thanked [place] for letting her know.

## 2020-09-02 NOTE — Progress Notes (Signed)
This is in follow-up to the result note I sent back to you regarding patient's sodium level and recommendations for changing hydrochlorothiazide dose.  I just wanted to let you know I sent the medication to [facility], if she tolerates this new dose then we can send it through [facility] in the future.  Just make sure the patient knows to pick up the medication at [facility].  Thank you.

## 2020-09-04 ENCOUNTER — Encounter: Payer: Self-pay | Admitting: Pulmonary Disease

## 2020-09-04 ENCOUNTER — Ambulatory Visit (INDEPENDENT_AMBULATORY_CARE_PROVIDER_SITE_OTHER): Payer: Medicare HMO | Admitting: Pulmonary Disease

## 2020-09-04 ENCOUNTER — Other Ambulatory Visit: Payer: Self-pay

## 2020-09-04 VITALS — BP 108/72 | HR 78 | Temp 96.9°F | Ht 67.0 in | Wt 149.8 lb

## 2020-09-04 DIAGNOSIS — I422 Other hypertrophic cardiomyopathy: Secondary | ICD-10-CM | POA: Diagnosis not present

## 2020-09-04 DIAGNOSIS — J432 Centrilobular emphysema: Secondary | ICD-10-CM | POA: Diagnosis not present

## 2020-09-04 DIAGNOSIS — I340 Nonrheumatic mitral (valve) insufficiency: Secondary | ICD-10-CM

## 2020-09-04 DIAGNOSIS — I5032 Chronic diastolic (congestive) heart failure: Secondary | ICD-10-CM

## 2020-09-04 DIAGNOSIS — M069 Rheumatoid arthritis, unspecified: Secondary | ICD-10-CM | POA: Diagnosis not present

## 2020-09-04 DIAGNOSIS — J439 Emphysema, unspecified: Secondary | ICD-10-CM | POA: Diagnosis not present

## 2020-09-04 MED ORDER — BUPROPION HCL ER (SMOKING DET) 150 MG PO TB12: 150.0000 mg | ORAL_TABLET | Freq: Two times a day (BID) | ORAL | 3 refills | Status: DC

## 2020-09-04 MED ORDER — ALBUTEROL SULFATE (2.5 MG/3ML) 0.083% IN NEBU: 2.5000 mg | INHALATION_SOLUTION | Freq: Four times a day (QID) | RESPIRATORY_TRACT | 12 refills | Status: DC | PRN

## 2020-09-04 NOTE — Patient Instructions (Signed)
Bupropion 150 mg pill >> 1 pill daily for first 3 days, then 1 pill twice daily.  Set your quit date to stop smoking after you have been on bupropion for one week.  Okay to use nicotine gum or lozenge as needed while on bupropion.  Will arrange for overnight oxygen test.  Will arrange for follow up with Dr. Domenic Polite with Cardiology  Follow up in 2 to 3 months

## 2020-09-04 NOTE — Progress Notes (Signed)
[facility]  Chief Complaint  Patient presents with   Consult    Productive cough with clear phlegm    Constitutional:  BP 108/72 (BP Location: Left Arm, Cuff Size: Normal)   Pulse 78   Temp (!) 96.9 F (36.1 C) (Temporal)   Ht 5\' 7"  (1.702 m)   Wt 149 lb 12.8 oz (67.9 kg)   SpO2 98% Comment: Room air  BMI 23.46 kg/m   Past Medical History:  Back pain, Diastolic CHF, HTN, Colon polyps, Migraine headache, Pneumonia, HLD, PAD, Panic attack, Rheumatoid arthritis, Influenza December 2018  Past Surgical History:  She  has a past surgical history that includes Back surgery; leg stents; Total knee arthroplasty; ulnar nerve surgery; Carpal tunnel release; Trigger finger release; Cholecystectomy; Coronary angioplasty (01/06/12/2005/2006); Givens capsule study; Colonoscopy; Esophagogastroduodenoscopy (07/2004); Endarterectomy (04/20/2012); Angioplasty (04/20/2012); carotid angiogram (N/A, 01/06/2012); lower extremity angiogram (N/A, 01/06/2012); Cardiac catheterization (01/31/2005); Abdominal hysterectomy; and EGD/TCS (03/2004).  Brief Summary:  Leslie [name] is a 75 y.o. female smoker with COPD.       Subjective:   She is from [place].  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  No animal/bird exposures.  Had pneumonia few years ago. No history of TB.  No family history of emphysema that she is aware off.  Continues to smoke cigarettes, currently 1/2 ppd.  She tried chantix before but made her feel nauseous.  She tried nicotine patch but got skin irritation.  She has intermittent cough.  Seems to happen more when she eats.  Had laryngoscopy by ENT and was unrevealing.  She will sometimes bring up clear sputum.  She doesn't feel like her breathing limits her activity level.  She occasionally gets occasionally wheezing and uses her albuterol.  She doesn't feel like she has breathing issues while asleep.  No leg swelling, fever, chest pain, or  hemoptysis.  Physical Exam:   Appearance - well kempt   ENMT - no sinus tenderness, no oral exudate, no LAN, Mallampati 3 airway, no stridor, wears dentures  Respiratory - equal breath sounds bilaterally, no wheezing or rales  CV - s1s2 regular rate and rhythm, 2/6 systolic murmur  Ext - no clubbing, no edema  Skin - no rashes  Psych - normal mood and affect   Pulmonary testing:  PFT 06/09/16 >> FEV1 0.97 (40%) , FEV1% 52, TLC 4.57 (85%), DLCO 42%  Chest Imaging:  CT angio chest 03/17/17 >> advanced emphysema, bronchial thickening, scattered reticulonodular opacities, atherosclerosis  Cardiac Tests:  Echo 03/08/20 >> EF 70 to 75%, severe LVH, grade 1 DD, mod LA dilation, mild/mod MR, RVSP 77 mmHg, mod TR  Social History:  She  reports that she has been smoking cigarettes. She started smoking about 61 years ago. She has a 25.00 pack-year smoking history. She has never used smokeless tobacco. She reports previous alcohol use. She reports that she does not use drugs.  Family History:  Her family history includes Cancer in her father; Diabetes in her brother and sister; Heart attack in her brother; Heart disease in her brother; Hyperlipidemia in her mother; Hypertension in her brother, mother, and sister.     Assessment/Plan:   COPD with emphysema. - reviewed her previous CT chest and PFT - discussed option of adding maintenance inhaler therapy; she doesn't feel like she needs this at present - will arrange for overnight oximetry on room air - prn albuterol; will arrange for home nebulizer  Tobacco abuse. - intolerant  of chantix due to nausea and nicotine patch due to skin irritation - she is willing to try bupropion >> discussed dosing regimen, setting quit date, and side effects to monitor for - she can use nicotine gum or lozenge prn while on bupropion - explained we would reassess if further assessment needed for her cough after she is off cigarettes  Hypertrophic  cardiomyopathy, chronic diastolic CHF, mitral valve regurgitation. - previously seen by Dr. Johnny Bridge with [facility]; will arrange for follow up with him  Time Spent Involved in Patient Care on Day of Examination:  49 minutes  Follow up:   Patient Instructions  Bupropion 150 mg pill >> 1 pill daily for first 3 days, then 1 pill twice daily.  Set your quit date to stop smoking after you have been on bupropion for one week.  Okay to use nicotine gum or lozenge as needed while on bupropion.  Will arrange for overnight oxygen test.  Will arrange for follow up with Dr. Domenic Polite with Cardiology  Follow up in 2 to 3 months  Medication List:   Allergies as of 09/04/2020       Reactions   Latex Other (See Comments)   Burns skin    Tape Other (See Comments), Itching   Burns skin    Atorvastatin Other (See Comments)   myalgia myalgia        Medication List        Accurate as of September 04, 2020 10:48 AM. If you have any questions, ask your nurse or doctor.          STOP taking these medications    furosemide 40 MG tablet Commonly known as: LASIX Stopped by: Chesley Mires, MD   metoprolol succinate 25 MG 24 hr tablet Commonly known as: TOPROL-XL Stopped by: Chesley Mires, MD   potassium chloride 10 MEQ tablet Commonly known as: KLOR-CON Stopped by: Chesley Mires, MD       TAKE these medications    albuterol 108 (90 Base) MCG/ACT inhaler Commonly known as: VENTOLIN HFA INHALE 2 INHALATIONS INTO THE LUNGS EVERY 6 HOURS AS NEEDED FOR WHEEZING What changed: See the new instructions.   albuterol (2.5 MG/3ML) 0.083% nebulizer solution Commonly known as: PROVENTIL Take 3 mLs (2.5 mg total) by nebulization every 6 (six) hours as needed for wheezing or shortness of breath. What changed: You were already taking a medication with the same name, and this prescription was added. Make sure you understand how and when to take each. Changed by: Chesley Mires, MD   aspirin EC  81 MG tablet Take 81 mg by mouth daily.   buPROPion 150 MG 12 hr tablet Commonly known as: ZYBAN Take 1 tablet (150 mg total) by mouth 2 (two) times daily. Started by: Chesley Mires, MD   celecoxib 100 MG capsule Commonly known as: CELEBREX Take by mouth.   cilostazol 100 MG tablet Commonly known as: PLETAL TAKE 1 TABLET(100 MG) BY MOUTH TWICE DAILY   hydrochlorothiazide 12.5 MG tablet Commonly known as: HYDRODIURIL Take 0.5 tablets (6.25 mg total) by mouth daily.   IBU 800 MG tablet Generic drug: ibuprofen TAKE 1 TABLET EVERY DAY AS NEEDED FOR MODERATE PAIN.   losartan 50 MG tablet Commonly known as: COZAAR   olmesartan 20 MG tablet Commonly known as: BENICAR TAKE 1 TABLET(20 MG) BY MOUTH DAILY   pantoprazole 40 MG tablet Commonly known as: PROTONIX TAKE 1 TABLET EVERY DAY   rosuvastatin 10 MG tablet Commonly known as: Crestor  Take 1 tablet (10 mg total) by mouth daily.   Vitamin D-3 125 MCG (5000 UT) Tabs Take 2 tablets by mouth daily.        Signature:  Chesley Mires, MD [facility] Pager - [phone] 09/04/2020, 10:48 AM

## 2020-09-05 DIAGNOSIS — J432 Centrilobular emphysema: Secondary | ICD-10-CM | POA: Diagnosis not present

## 2020-09-09 ENCOUNTER — Ambulatory Visit (HOSPITAL_COMMUNITY)
Admission: RE | Admit: 2020-09-09 | Discharge: 2020-09-09 | Disposition: A | Discharge status: Home / Self Care | Payer: Medicare HMO | Source: Ambulatory Visit | Attending: Nurse Practitioner | Admitting: Nurse Practitioner

## 2020-09-09 DIAGNOSIS — K3189 Other diseases of stomach and duodenum: Secondary | ICD-10-CM | POA: Diagnosis not present

## 2020-09-09 DIAGNOSIS — N200 Calculus of kidney: Secondary | ICD-10-CM | POA: Diagnosis not present

## 2020-09-09 DIAGNOSIS — K753 Granulomatous hepatitis, not elsewhere classified: Secondary | ICD-10-CM | POA: Diagnosis not present

## 2020-09-09 DIAGNOSIS — R1011 Right upper quadrant pain: Secondary | ICD-10-CM | POA: Diagnosis not present

## 2020-09-09 DIAGNOSIS — K551 Chronic vascular disorders of intestine: Secondary | ICD-10-CM | POA: Diagnosis not present

## 2020-09-10 ENCOUNTER — Encounter (INDEPENDENT_AMBULATORY_CARE_PROVIDER_SITE_OTHER): Payer: Self-pay | Admitting: Nurse Practitioner

## 2020-09-10 DIAGNOSIS — I700 Atherosclerosis of aorta: Secondary | ICD-10-CM | POA: Insufficient documentation

## 2020-09-11 DIAGNOSIS — R0683 Snoring: Secondary | ICD-10-CM | POA: Diagnosis not present

## 2020-09-11 DIAGNOSIS — G4730 Sleep apnea, unspecified: Secondary | ICD-10-CM | POA: Diagnosis not present

## 2020-09-26 ENCOUNTER — Other Ambulatory Visit (INDEPENDENT_AMBULATORY_CARE_PROVIDER_SITE_OTHER): Payer: Self-pay | Admitting: Internal Medicine

## 2020-09-27 ENCOUNTER — Telehealth (INDEPENDENT_AMBULATORY_CARE_PROVIDER_SITE_OTHER): Payer: Self-pay | Admitting: Nurse Practitioner

## 2020-09-27 NOTE — Telephone Encounter (Signed)
I am not sure if Jinny Blossom or Tawanna Sat is working on 09/28/20, but will one of you call this patient and verify whether she is taking losartan or olmesartan. Will you also verify which dose she is on and if she needs a refill? Thank you.

## 2020-09-29 ENCOUNTER — Telehealth: Payer: Self-pay | Admitting: Pulmonary Disease

## 2020-09-29 DIAGNOSIS — J9611 Chronic respiratory failure with hypoxia: Secondary | ICD-10-CM

## 2020-09-29 DIAGNOSIS — J432 Centrilobular emphysema: Secondary | ICD-10-CM

## 2020-09-29 DIAGNOSIS — J438 Other emphysema: Secondary | ICD-10-CM

## 2020-09-29 NOTE — Telephone Encounter (Signed)
So currently  1 losartan 50 mg daily. Asked her to check bottle; and this is what she stated. " Older RX was 1 tablet 2 times day . (1tablet-AM/1tablet-PM).

## 2020-09-29 NOTE — Telephone Encounter (Signed)
ONO with RA 09/11/20 >> test time 3 hrs 38 min.  Baseline SpO2 89%, low SpO2 80%.  Spent 1 hr 2 min with SpO2 < 88%.    Please let her know her oxygen level is low when she is asleep.  Please arrange for her to get started on supplemental oxygen at 2 liters with sleep.  Please use diagnosis code for COPD with emphysema and chronic respiratory failure.

## 2020-09-29 NOTE — Telephone Encounter (Signed)
She is losartan 50 mg ;1 day. BUT Rx was before was take 1 in AM 7 1 in PM.

## 2020-09-30 ENCOUNTER — Other Ambulatory Visit (INDEPENDENT_AMBULATORY_CARE_PROVIDER_SITE_OTHER): Payer: Self-pay | Admitting: Nurse Practitioner

## 2020-09-30 DIAGNOSIS — I10 Essential (primary) hypertension: Secondary | ICD-10-CM

## 2020-09-30 MED ORDER — LOSARTAN POTASSIUM 50 MG PO TABS: 50.0000 mg | ORAL_TABLET | Freq: Two times a day (BID) | ORAL | 0 refills | Status: DC

## 2020-09-30 NOTE — Telephone Encounter (Signed)
Return call to pt phone. Pt was lvm and explained the instructions. Told her to call the office back. Pt is to call me back.

## 2020-10-05 DIAGNOSIS — J432 Centrilobular emphysema: Secondary | ICD-10-CM | POA: Diagnosis not present

## 2020-10-05 NOTE — Telephone Encounter (Signed)
ATC patient to go over ONO results, LMTCB ?

## 2020-10-06 NOTE — Telephone Encounter (Signed)
Patient called office. Dr. Juanetta Gosling   results and recommendations given. Understanding stated.DME order placed.  Nothing further at this time.

## 2020-10-07 ENCOUNTER — Other Ambulatory Visit: Payer: Self-pay

## 2020-10-07 ENCOUNTER — Other Ambulatory Visit (INDEPENDENT_AMBULATORY_CARE_PROVIDER_SITE_OTHER): Payer: Medicare HMO

## 2020-10-07 DIAGNOSIS — E871 Hypo-osmolality and hyponatremia: Secondary | ICD-10-CM | POA: Diagnosis not present

## 2020-10-07 LAB — BASIC METABOLIC PANEL WITH GFR
BUN: 12 mg/dL (ref 7–25)
CO2: 33 mmol/L — ABNORMAL HIGH (ref 20–32)
Calcium: 10.1 mg/dL (ref 8.6–10.4)
Chloride: 99 mmol/L (ref 98–110)
Creat: 0.94 mg/dL (ref 0.60–1.00)
Glucose, Bld: 103 mg/dL — ABNORMAL HIGH (ref 65–99)
Potassium: 5.6 mmol/L — ABNORMAL HIGH (ref 3.5–5.3)
Sodium: 136 mmol/L (ref 135–146)
eGFR: 64 mL/min/{1.73_m2} (ref >=60)

## 2020-10-09 ENCOUNTER — Telehealth: Payer: Self-pay | Admitting: Pulmonary Disease

## 2020-10-09 NOTE — Telephone Encounter (Signed)
Called and spoke with pt and she stated that the DME came out to deliver her oxygen to her (she only uses the oxygen at night to sleep)  she stated that she did refuse the tank that they brought out.  She stated that the reason for this is that she travels a lot and she stated that she cannot handle a large tank when she travels and wanted to know if there is something else that she could get that would be more manageable for her.  VS please advise. Thanks

## 2020-10-19 NOTE — Telephone Encounter (Signed)
Called and spoke with patient. I explained to her the purpose of the tank and the stationary O2. She verbalized understanding. I also went over the results of her ONO and advised her that she does need the O2. She had refused delivery until she heard back from our office. She will call them back tomorrow to get setup for the O2 at night.   Nothing further needed at time of call.

## 2020-10-19 NOTE — Telephone Encounter (Signed)
She should have stationary concentrator to use at night with sleep, and tanks are generally supplied as a back up in case her power goes out.  She would likely need to be qualified for a portable oxygen concentrator if she wants a set up easier to travel with, or she should try purchasing a POC on her own but this can be quite expensive.  Please let me know how she would like to proceed.

## 2020-10-22 ENCOUNTER — Encounter (INDEPENDENT_AMBULATORY_CARE_PROVIDER_SITE_OTHER): Payer: Self-pay

## 2020-10-28 ENCOUNTER — Encounter: Payer: Self-pay | Admitting: Pulmonary Disease

## 2020-10-29 ENCOUNTER — Ambulatory Visit (INDEPENDENT_AMBULATORY_CARE_PROVIDER_SITE_OTHER): Payer: Medicare HMO | Admitting: Nurse Practitioner

## 2020-11-05 ENCOUNTER — Telehealth (INDEPENDENT_AMBULATORY_CARE_PROVIDER_SITE_OTHER): Payer: Self-pay

## 2020-11-05 DIAGNOSIS — I10 Essential (primary) hypertension: Secondary | ICD-10-CM

## 2020-11-05 DIAGNOSIS — E875 Hyperkalemia: Secondary | ICD-10-CM

## 2020-11-05 DIAGNOSIS — I5030 Unspecified diastolic (congestive) heart failure: Secondary | ICD-10-CM

## 2020-11-05 DIAGNOSIS — J432 Centrilobular emphysema: Secondary | ICD-10-CM | POA: Diagnosis not present

## 2020-11-05 MED ORDER — HYDROCHLOROTHIAZIDE 12.5 MG PO TABS: 12.5000 mg | ORAL_TABLET | Freq: Every day | ORAL | 0 refills | Status: DC

## 2020-11-05 NOTE — Telephone Encounter (Signed)
Received a refill request from [facility] for the following medication and the patient stated that she is taking 1 tab daily and they need an updated prescription if this is correct:  hydrochlorothiazide (HYDRODIURIL) 12.5 MG tablet  Can you send in new Rx with correct dosage?

## 2020-11-05 NOTE — Telephone Encounter (Signed)
I have approved the 1 tablet by mouth daily and called patient to discuss this. She tells me she has been taking 1 tablet by mouth a day and is tolerating this dose. SHe monitors BP at home and it is usually in the 100s/60s. She did have mild hyperkalemia on last CMP so I have ordered repeat CMP and told her to go to [facility] by Tuesday of next week so I can make sure her potassium has improved. She voiced her understanding.

## 2020-11-09 DIAGNOSIS — E875 Hyperkalemia: Secondary | ICD-10-CM | POA: Diagnosis not present

## 2020-11-09 LAB — COMPLETE METABOLIC PANEL WITH GFR
AG Ratio: 1.2 (calc) (ref 1.0–2.5)
ALT: 14 U/L (ref 6–29)
AST: 17 U/L (ref 10–35)
Albumin: 3.9 g/dL (ref 3.6–5.1)
Alkaline phosphatase (APISO): 52 U/L (ref 37–153)
BUN: 16 mg/dL (ref 7–25)
CO2: 33 mmol/L — ABNORMAL HIGH (ref 20–32)
Calcium: 9.4 mg/dL (ref 8.6–10.4)
Chloride: 93 mmol/L — ABNORMAL LOW (ref 98–110)
Creat: 0.95 mg/dL (ref 0.60–1.00)
Globulin: 3.2 g/dL (calc) (ref 1.9–3.7)
Glucose, Bld: 92 mg/dL (ref 65–99)
Potassium: 4.6 mmol/L (ref 3.5–5.3)
Sodium: 132 mmol/L — ABNORMAL LOW (ref 135–146)
Total Bilirubin: 0.6 mg/dL (ref 0.2–1.2)
Total Protein: 7.1 g/dL (ref 6.1–8.1)
eGFR: 63 mL/min/{1.73_m2} (ref >=60)

## 2020-11-10 ENCOUNTER — Other Ambulatory Visit (INDEPENDENT_AMBULATORY_CARE_PROVIDER_SITE_OTHER): Payer: Self-pay | Admitting: Nurse Practitioner

## 2020-11-27 ENCOUNTER — Ambulatory Visit: Payer: Medicare HMO | Admitting: Cardiology

## 2020-12-04 ENCOUNTER — Ambulatory Visit: Payer: Medicare HMO | Admitting: Pulmonary Disease

## 2020-12-06 DIAGNOSIS — J432 Centrilobular emphysema: Secondary | ICD-10-CM | POA: Diagnosis not present

## 2020-12-15 ENCOUNTER — Other Ambulatory Visit: Payer: Self-pay

## 2020-12-15 ENCOUNTER — Ambulatory Visit: Payer: Medicare HMO | Admitting: Physician Assistant

## 2020-12-15 ENCOUNTER — Encounter: Payer: Self-pay | Admitting: Physician Assistant

## 2020-12-15 VITALS — BP 124/68 | HR 88 | Ht 67.0 in | Wt 158.0 lb

## 2020-12-15 DIAGNOSIS — E785 Hyperlipidemia, unspecified: Secondary | ICD-10-CM

## 2020-12-15 DIAGNOSIS — I10 Essential (primary) hypertension: Secondary | ICD-10-CM

## 2020-12-15 DIAGNOSIS — I739 Peripheral vascular disease, unspecified: Secondary | ICD-10-CM

## 2020-12-15 DIAGNOSIS — Z720 Tobacco use: Secondary | ICD-10-CM | POA: Diagnosis not present

## 2020-12-15 DIAGNOSIS — Z23 Encounter for immunization: Secondary | ICD-10-CM | POA: Diagnosis not present

## 2020-12-15 DIAGNOSIS — I422 Other hypertrophic cardiomyopathy: Secondary | ICD-10-CM | POA: Diagnosis not present

## 2020-12-15 MED ORDER — FUROSEMIDE 20 MG PO TABS: 20.0000 mg | ORAL_TABLET | Freq: Every day | ORAL | 3 refills | Status: DC | PRN

## 2020-12-15 MED ORDER — ROSUVASTATIN CALCIUM 10 MG PO TABS: 10.0000 mg | ORAL_TABLET | Freq: Every day | ORAL | 0 refills | Status: DC

## 2020-12-15 MED ORDER — POTASSIUM CHLORIDE CRYS ER 20 MEQ PO TBCR: 20.0000 meq | EXTENDED_RELEASE_TABLET | Freq: Every day | ORAL | 3 refills | Status: DC | PRN

## 2020-12-15 MED ORDER — METOPROLOL SUCCINATE ER 25 MG PO TB24: 25.0000 mg | ORAL_TABLET | Freq: Every day | ORAL | 3 refills | Status: DC

## 2020-12-15 NOTE — Patient Instructions (Signed)
Medication Instructions:  Your physician recommends that you continue on your current medications as directed. Please refer to the Current Medication list given to you today.  *If you need a refill on your cardiac medications before your next appointment, please call your pharmacy*   Lab Work: None If you have labs (blood work) drawn today and your tests are completely normal, you will receive your results only by: [place] (if you have MyChart) OR A paper copy in the mail If you have any lab test that is abnormal or we need to change your treatment, we will call you to review the results.   Testing/Procedures: None   Follow-Up: At [facility], you and your health needs are our priority.  As part of our continuing mission to provide you with exceptional heart care, we have created designated Provider Care Teams.  These Care Teams include your primary Cardiologist (physician) and Advanced Practice Providers (APPs -  Physician Assistants and Nurse Practitioners) who all work together to provide you with the care you need, when you need it.  We recommend signing up for the patient portal called "MyChart".  Sign up information is provided on this After Visit Summary.  MyChart is used to connect with patients for Virtual Visits (Telemedicine).  Patients are able to view lab/test results, encounter notes, upcoming appointments, etc.  Non-urgent messages can be sent to your provider as well.   To learn more about what you can do with MyChart, go to [email or web address].    Your next appointment:   1 year(s)  The format for your next appointment:   In Person  Provider:   Rozann Lesches, MD   Other Instructions Health Risks of Smoking Smoking tobacco is very bad for your health. Tobacco smoke contains many toxic chemicals that can damage every part of your body. Secondhand smoke can be harmful to those around you. Tobacco or nicotine use can cause many long-term (chronic)  diseases. Smoking is difficult to quit because a chemical in tobacco, called nicotine, causes addiction or dependence. When you smoke and inhale, nicotine is absorbed quickly into the bloodstream through your lungs. Both inhaled and non-inhaled nicotine may be addictive. How can quitting affect me? There are health benefits of quitting smoking. Some benefits happen right away and others take time. Benefits may include: Blood flow, blood pressure, heart rate, and lung capacity may begin to improve. However, any lung damage that has already occurred cannot be repaired. Temporary respiratory symptoms, such as nasal congestion and cough, may improve over time. Your risk of heart disease, stroke, and cancer is reduced. The overall quality of your health may improve. You may save money, as you will not spend money on tobacco products and may spend less money on smoking-related health issues. What can increase my risk? Smoking harms nearly every organ in the body. People who smoke tobacco have a shorter life expectancy and an increased risk of many serious medical problems. These include: More respiratory infections, such as colds and pneumonia. Cancer. Heart disease. Stroke. Chronic respiratory diseases. Delayed wound healing and increased risk of complications during surgery. Problems with reproduction, pregnancy, and childbirth, such as infertility, early (premature) births, stillbirths, and birth defects. Secondhand smoke exposure to children increases the risk of: Sudden infant death syndrome (SIDS). Infections in the nose, throat, or airways (respiratory infections). Chronic respiratory symptoms. What actions can I take to quit? Smoking is an addiction that affects both your body and your mind, and long-time habits can be hard to  change. Your health care provider can recommend: Nicotine replacement products, such as patches, gum, and nasal sprays. Use these products only as directed. Do not  replace cigarette smoking with electronic cigarettes, which are commonly called e-cigarettes. The safety of e-cigarettes is not known, and some may contain harmful chemicals. Programs and community resources, which may include group support, education, or talk therapy. Prescription medicines to help reduce cravings. A combination of two or more quit methods, which will increase the success of quitting. Where to find support Follow the recommendations from your health care provider about support groups and other assistance. You can also visit: [employer]: www.naquitline.org or call 1-800-QUIT-NOW. U.S. Department of Health and Human Services: www.smokefree.gov American Lung Association: www.freedomfromsmoking.org American Heart Association: www.heart.org Where to find more information Centers for Disease Control and Prevention: http://www.wolf.info/ World Health Organization: RoleLink.com.br Summary Smoking tobacco is very bad for your health. Tobacco smoke contains many toxic chemicals that can damage every part of the body. Smoking is difficult to quit because a chemical in tobacco, called nicotine, causes addiction or dependence. There are immediate and long-term health benefits of quitting smoking. A combination of two or more quit methods increases the success of quitting. This information is not intended to replace advice given to you by your health care provider. Make sure you discuss any questions you have with your health care provider. Document Revised: 04/22/2019 Document Reviewed: 04/22/2019 Elsevier Patient Education  2022 [employer].    Two Gram Sodium Diet 2000 mg  What is Sodium? Sodium is a mineral found naturally in many foods. The most significant source of sodium in the diet is table salt, which is about 40% sodium.  Processed, convenience, and preserved foods also contain a large amount of sodium.  The body needs only 500 mg of sodium daily to function,  A  normal diet provides more than enough sodium even if you do not use salt.  Why Limit Sodium? A build up of sodium in the body can cause thirst, increased blood pressure, shortness of breath, and water retention.  Decreasing sodium in the diet can reduce edema and risk of heart attack or stroke associated with high blood pressure.  Keep in mind that there are many other factors involved in these health problems.  Heredity, obesity, lack of exercise, cigarette smoking, stress and what you eat all play a role.  General Guidelines: Do not add salt at the table or in cooking.  One teaspoon of salt contains over 2 grams of sodium. Read food labels Avoid processed and convenience foods Ask your dietitian before eating any foods not dicussed in the menu planning guidelines Consult your physician if you wish to use a salt substitute or a sodium containing medication such as antacids.  Limit milk and milk products to 16 oz (2 cups) per day.  Shopping Hints: READ LABELS!! "Dietetic" does not necessarily mean low sodium. Salt and other sodium ingredients are often added to foods during processing.    Menu Planning Guidelines Food Group Choose More Often Avoid  Beverages (see also the milk group All fruit juices, low-sodium, salt-free vegetables juices, low-sodium carbonated beverages Regular vegetable or tomato juices, commercially softened water used for drinking or cooking  Breads and Cereals Enriched white, wheat, rye and pumpernickel bread, hard rolls and dinner rolls; muffins, cornbread and waffles; most dry cereals, cooked cereal without added salt; unsalted crackers and breadsticks; low sodium or homemade bread crumbs Bread, rolls and crackers with salted tops; quick breads; instant  hot cereals; pancakes; commercial bread stuffing; self-rising flower and biscuit mixes; regular bread crumbs or cracker crumbs  Desserts and Sweets Desserts and sweets mad with mild should be within allowance Instant  pudding mixes and cake mixes  Fats Butter or margarine; vegetable oils; unsalted salad dressings, regular salad dressings limited to 1 Tbs; light, sour and heavy cream Regular salad dressings containing bacon fat, bacon bits, and salt pork; snack dips made with instant soup mixes or processed cheese; salted nuts  Fruits Most fresh, frozen and canned fruits Fruits processed with salt or sodium-containing ingredient (some dried fruits are processed with sodium sulfites        Vegetables Fresh, frozen vegetables and low- sodium canned vegetables Regular canned vegetables, sauerkraut, pickled vegetables, and others prepared in brine; frozen vegetables in sauces; vegetables seasoned with ham, bacon or salt pork  Condiments, Sauces, Miscellaneous  Salt substitute with physician's approval; pepper, herbs, spices; vinegar, lemon or lime juice; hot pepper sauce; garlic powder, onion powder, low sodium soy sauce (1 Tbs.); low sodium condiments (ketchup, chili sauce, mustard) in limited amounts (1 tsp.) fresh ground horseradish; unsalted tortilla chips, pretzels, potato chips, popcorn, salsa (1/4 cup) Any seasoning made with salt including garlic salt, celery salt, onion salt, and seasoned salt; sea salt, rock salt, kosher salt; meat tenderizers; monosodium glutamate; mustard, regular soy sauce, barbecue, sauce, chili sauce, teriyaki sauce, steak sauce, Worcestershire sauce, and most flavored vinegars; canned gravy and mixes; regular condiments; salted snack foods, olives, picles, relish, horseradish sauce, catsup   Food preparation: Try these seasonings Meats:    Pork Sage, onion Serve with applesauce  Chicken Poultry seasoning, thyme, parsley Serve with cranberry sauce  Lamb Curry powder, rosemary, garlic, thyme Serve with mint sauce or jelly  Veal Marjoram, basil Serve with current jelly, cranberry sauce  Beef Pepper, bay leaf Serve with dry mustard, unsalted chive butter  Fish [place] leaf, dill Serve with  unsalted lemon butter, unsalted parsley butter  Vegetables:    Asparagus Lemon juice   Broccoli Lemon juice   Carrots Mustard dressing parsley, mint, nutmeg, glazed with unsalted butter and sugar   Green beans Marjoram, lemon juice, nutmeg,dill seed   Tomatoes Basil, marjoram, onion   Spice /blend for [employer]" 4 tsp ground thyme 1 tsp ground sage 3 tsp ground rosemary 4 tsp ground marjoram   Test your knowledge A product that says "Salt Free" may still contain sodium. True or False Garlic Powder and Hot Pepper Sauce an be used as alternative seasonings.True or False Processed foods have more sodium than fresh foods.  True or False Canned Vegetables have less sodium than froze True or False   WAYS TO DECREASE YOUR SODIUM INTAKE Avoid the use of added salt in cooking and at the table.  Table salt (and other prepared seasonings which contain salt) is probably one of the greatest sources of sodium in the diet.  Unsalted foods can gain flavor from the sweet, sour, and butter taste sensations of herbs and spices.  Instead of using salt for seasoning, try the following seasonings with the foods listed.  Remember: how you use them to enhance natural food flavors is limited only by your creativity... Allspice-Meat, fish, eggs, fruit, peas, red and yellow vegetables Almond Extract-Fruit baked goods Anise Seed-Sweet breads, fruit, carrots, beets, cottage cheese, cookies (tastes like licorice) Basil-Meat, fish, eggs, vegetables, rice, vegetables salads, soups, sauces Bay Leaf-Meat, fish, stews, poultry Burnet-Salad, vegetables (cucumber-like flavor) Caraway Seed-Bread, cookies, cottage cheese, meat, vegetables, cheese, rice Cardamon-Baked goods,  fruit, soups Celery Powder or seed-Salads, salad dressings, sauces, meatloaf, soup, bread.Do not use  celery salt Chervil-Meats, salads, fish, eggs, vegetables, cottage cheese (parsley-like flavor) Chili Power-Meatloaf, chicken cheese, corn, eggplant,  egg dishes Chives-Salads cottage cheese, egg dishes, soups, vegetables, sauces Cilantro-Salsa, casseroles Cinnamon-Baked goods, fruit, pork, lamb, chicken, carrots Cloves-Fruit, baked goods, fish, pot roast, green beans, beets, carrots Coriander-Pastry, cookies, meat, salads, cheese (lemon-orange flavor) Cumin-Meatloaf, fish,cheese, eggs, cabbage,fruit pie (caraway flavor) [employer], fruit, eggs, fish, poultry, cottage cheese, vegetables Dill Seed-Meat, cottage cheese, poultry, vegetables, fish, salads, bread Fennel Seed-Bread, cookies, apples, pork, eggs, fish, beets, cabbage, cheese, Licorice-like flavor Garlic-(buds or powder) Salads, meat, poultry, fish, bread, butter, vegetables, potatoes.Do not  use garlic salt Ginger-Fruit, vegetables, baked goods, meat, fish, poultry Horseradish Root-Meet, vegetables, butter Lemon Juice or Extract-Vegetables, fruit, tea, baked goods, fish salads Mace-Baked goods fruit, vegetables, fish, poultry (taste like nutmeg) Maple Extract-Syrups Marjoram-Meat, chicken, fish, vegetables, breads, green salads (taste like Sage) Mint-Tea, lamb, sherbet, vegetables, desserts, carrots, cabbage Mustard, Dry or Seed-Cheese, eggs, meats, vegetables, poultry Nutmeg-Baked goods, fruit, chicken, eggs, vegetables, desserts Onion Powder-Meat, fish, poultry, vegetables, cheese, eggs, bread, rice salads (Do not use   Onion salt) Orange Extract-Desserts, baked goods Oregano-Pasta, eggs, cheese, onions, pork, lamb, fish, chicken, vegetables, green salads Paprika-Meat, fish, poultry, eggs, cheese, vegetables Parsley Flakes-Butter, vegetables, meat fish, poultry, eggs, bread, salads (certain forms may   Contain sodium Pepper-Meat fish, poultry, vegetables, eggs Peppermint Extract-Desserts, baked goods Poppy Seed-Eggs, bread, cheese, fruit dressings, baked goods, noodles, vegetables, cottage  [employer], poultry, meat, fish,  cauliflower, turnips,eggs bread Saffron-Rice, bread, veal, chicken, fish, eggs Sage-Meat, fish, poultry, onions, eggplant, tomateos, pork, stews Savory-Eggs, salads, poultry, meat, rice, vegetables, soups, pork Tarragon-Meat, poultry, fish, eggs, butter, vegetables (licorice-like flavor)  Thyme-Meat, poultry, fish, eggs, vegetables, (clover-like flavor), sauces, soups Tumeric-Salads, butter, eggs, fish, rice, vegetables (saffron-like flavor) Vanilla Extract-Baked goods, candy Vinegar-Salads, vegetables, meat marinades Walnut Extract-baked goods, candy   2. Choose your Foods Wisely   The following is a list of foods to avoid which are high in sodium:  Meats-Avoid all smoked, canned, salt cured, dried and kosher meat and fish as well as Anchovies   [employer] meats:Bologna, Liverwurst, Pastrami Canned meat or fish  Marinated herring Caviar    Pepperoni Corned Beef   Pizza Dried chipped beef  Salami Frozen breaded fish or meat Salt pork Frankfurters or hot dogs  Sardines Gefilte fish   Sausage Ham (boiled ham, Proscuitto Smoked butt    spiced ham)   Spam      TV Dinners Vegetables Canned vegetables (Regular) Relish Canned mushrooms  Sauerkraut Olives    Tomato juice Pickles  Bakery and Dessert Products Canned puddings  Cream pies Cheesecake   Decorated cakes Cookies  Beverages/Juices Tomato juice, regular  Gatorade   V-8 vegetable juice, regular  Breads and Cereals Biscuit mixes   Salted potato chips, corn chips, pretzels Bread stuffing mixes  Salted crackers and rolls Pancake and waffle mixes Self-rising flour  Seasonings Accent    Meat sauces Barbecue sauce  Meat tenderizer Catsup    Monosodium glutamate (MSG) Celery salt   Onion salt Chili sauce   Prepared mustard Garlic salt   Salt, seasoned salt, sea salt Gravy mixes   Soy sauce Horseradish   Steak sauce Ketchup   Tartar sauce Lite salt    Teriyaki sauce Marinade mixes   Worcestershire  sauce  Others Baking powder   Cocoa and cocoa mixes Baking soda  Commercial casserole mixes Candy-caramels, chocolate  Dehydrated soups    Bars, fudge,nougats  Instant rice and pasta mixes Canned broth or soup  Maraschino cherries Cheese, aged and processed cheese and cheese spreads  Learning Assessment Quiz  Indicated T (for True) or F (for False) for each of the following statements:  _____ Fresh fruits and vegetables and unprocessed grains are generally low in sodium _____ Water may contain a considerable amount of sodium, depending on the source _____ You can always tell if a food is high in sodium by tasting it _____ Certain laxatives my be high in sodium and should be avoided unless prescribed   by a physician or pharmacist _____ Salt substitutes may be used freely by anyone on a sodium restricted diet _____ Sodium is present in table salt, food additives and as a natural component of   most foods _____ Table salt is approximately 90% sodium _____ Limiting sodium intake may help prevent excess fluid accumulation in the body _____ On a sodium-restricted diet, seasonings such as bouillon soy sauce, and    cooking wine should be used in place of table salt _____ On an ingredient list, a product which lists monosodium glutamate as the first   ingredient is an appropriate food to include on a low sodium diet  Circle the best answer(s) to the following statements (Hint: there may be more than one correct answer)  11. On a low-sodium diet, some acceptable snack items are:    A. Olives  F. Bean dip   K. Grapefruit juice    B. Salted Pretzels G. Commercial Popcorn   L. Canned peaches    C. Carrot Sticks  H. Bouillon   M. Unsalted nuts   D. [place] fries  I. Peanut butter crackers N. Salami   E. Sweet pickles J. Tomato Juice   O. Pizza  12.  Seasonings that may be used freely on a reduced - sodium diet include   A. Lemon wedges F.Monosodium glutamate K. Celery  seed    B.Soysauce   G. Pepper   L. Mustard powder   C. Sea salt  H. Cooking wine  M. Onion flakes   D. Vinegar  E. Prepared horseradish N. Salsa   E. Sage   J. Worcestershire sauce  O. Chutney

## 2020-12-15 NOTE — Progress Notes (Signed)
Cardiology Office Note    Date:  12/15/2020   ID:  Leslie [name], DOB Jul 19, 1945, MRN [id number]   PCP:  [facility]  Cardiologist:  Rozann Lesches, MD   Advanced Practice Provider:  No care team member to display Electrophysiologist:  None   [id number]}   Chief Complaint  Patient presents with   Follow-up    History of Present Illness:  Leslie [name] is a 75 y.o. female  with history of HOCM on echo's at [facility] 2018, 2020. Normal LVEF, severe LVH no resting LVOT gradient.  Also has PAD with known occlusion of the right superficial femoral artery stent and prior right CEA in 2014, COPD with emphysema and chronic respiratory failure  Patient was seen by Dr. Domenic Polite 10/23/2019 and having trouble with good blood pressure control.  Toprol was added and she was asked to use Lasix as needed rather than daily.  She had differential arm blood pressures so Dopplers were ordered and showed a greater than 50% stenosis in the left subclavian.  She was asked to follow-up with Dr. Trula Jian who she already was established with.  Repeat 2D echo 03/08/2020 LVEF 70 to 75% with grade 1 DD, LV contraction is vigorous with near cavity obliteration there is intracavity gradient that is largest at the LV outflow tract without evidence of obstruction at rest.  Mild to moderate MR, moderate TR incidentally significant change from last tracing.  Patient comes in for f/u. BP in left arm running 96-108/74 right arm a little higher. Gets occasional edema and uses prn lasix-not often. Can tell in her stomach. Doesn't get a lot of salt.Smoking 1/2 ppd. Has tried chantix-made her sick, patches made her itch. O2 recommended but she didn't want it. She feels like she's doing well. Has abdominal bloating at times wondering if it's something she eats or drinks like sprite. I asked her to keep track of when it happens and what food she eats. Looking for a new PCP.  Past Medical History:   Diagnosis Date   Back pain    DDD   Carotid artery occlusion    Cough    Diastolic heart failure (HCC)    Dry skin    Essential hypertension    History of bruising easily    History of colon polyps    History of migraine    History of pneumonia    Hyperlipidemia    PAD (peripheral artery disease) (HCC)    Panic attack    Rheumatoid arthritis(714.0)     Past Surgical History:  Procedure Laterality Date   ABDOMINAL HYSTERECTOMY     ANGIOPLASTY  04/20/2012   Procedure: ANGIOPLASTY;  Surgeon: Serafina Mitchell, MD;  Location: [place];  Service: Vascular;  Laterality: Right;  Using 1cm x6 cm vascu-guard patch    [place]  01/31/2005   CAROTID ANGIOGRAM N/A 01/06/2012   Procedure: CAROTID ANGIOGRAM;  Surgeon: Lorretta Harp, MD;  Location: [facility] CATH LAB;  Service: Cardiovascular;  Laterality: N/A;   CARPAL TUNNEL RELEASE     CHOLECYSTECTOMY     COLONOSCOPY     CORONARY ANGIOPLASTY  01/06/12/2005/2006    2 stents   EGD/TCS  03/2004   Dr. Gala Romney: Tiny distal esophageal erosions, prepyloric antral erosions/ulcerations, internal hemorrhoids, diminutive polyp in the rectosigmoid junction removed (hyperplastic).  Pathology not available but reportedly she had H. pylori which was treated.   ENDARTERECTOMY  04/20/2012  Procedure: ENDARTERECTOMY CAROTID;  Surgeon: Serafina Mitchell, MD;  Location: [place];  Service: Vascular;  Laterality: Right;   ESOPHAGOGASTRODUODENOSCOPY  07/2004   Dr. Gala Romney: Previous antral ulcer was completely healed.  EGD normal.   GIVENS CAPSULE STUDY     ???   leg stents     LOWER EXTREMITY ANGIOGRAM N/A 01/06/2012   Procedure: LOWER EXTREMITY ANGIOGRAM;  Surgeon: Lorretta Harp, MD;  Location: [facility] CATH LAB;  Service: Cardiovascular;  Laterality: N/A;   TOTAL KNEE ARTHROPLASTY     TRIGGER FINGER RELEASE     ulnar nerve surgery      Current Medications: Current Meds  Medication Sig   albuterol (PROVENTIL) (2.5 MG/3ML) 0.083%  nebulizer solution Take 3 mLs (2.5 mg total) by nebulization every 6 (six) hours as needed for wheezing or shortness of breath.   albuterol (VENTOLIN HFA) 108 (90 Base) MCG/ACT inhaler INHALE 2 INHALATIONS INTO THE LUNGS EVERY 6 HOURS AS NEEDED FOR WHEEZING (Patient taking differently: Inhale 2 puffs into the lungs every 6 (six) hours as needed for wheezing.)   aspirin EC 81 MG tablet Take 81 mg by mouth daily.   buPROPion (ZYBAN) 150 MG 12 hr tablet Take 1 tablet (150 mg total) by mouth 2 (two) times daily.   celecoxib (CELEBREX) 100 MG capsule Take by mouth.   Cholecalciferol (VITAMIN D-3) 125 MCG (5000 UT) TABS Take 2 tablets by mouth daily.   cilostazol (PLETAL) 100 MG tablet TAKE 1 TABLET(100 MG) BY MOUTH TWICE DAILY   IBU 800 MG tablet TAKE 1 TABLET EVERY DAY AS NEEDED FOR MODERATE PAIN.   losartan (COZAAR) 50 MG tablet Take 1 tablet (50 mg total) by mouth in the morning and at bedtime.   metoprolol succinate (TOPROL-XL) 25 MG 24 hr tablet Take 25 mg by mouth daily.   pantoprazole (PROTONIX) 40 MG tablet TAKE 1 TABLET EVERY DAY   rosuvastatin (CRESTOR) 10 MG tablet Take 1 tablet (10 mg total) by mouth daily.     Allergies:   Latex, Tape, and Atorvastatin   Social History   Socioeconomic History   Marital status: Divorced    Spouse name: Not on file   Number of children: Not on file   Years of education: Not on file   Highest education level: Not on file  Occupational History   Not on file  Tobacco Use   Smoking status: Every Day    Packs/day: 0.50    Years: 50.00    Pack years: 25.00    Types: Cigarettes    Start date: 1961   Smokeless tobacco: Never   Tobacco comments:    Smoke 1/2 pack per day 09/04/20  Vaping Use   Vaping Use: Never used  Substance and Sexual Activity   Alcohol use: Not Currently    Comment: occassionally   Drug use: No   Sexual activity: Not on file  Other Topics Concern   Not on file  Social History Narrative   Divorced.Lives alone.Works at [employer].   Social Determinants of Health   Financial Resource Strain: Not on file  Food Insecurity: Not on file  Transportation Needs: Not on file  Physical Activity: Not on file  Stress: Not on file  Social Connections: Not on file     Family History:  The patient's  family history includes Cancer in her father; Diabetes in her brother and sister; Heart attack in her brother; Heart disease in her brother; Hyperlipidemia in her mother; Hypertension in her brother,  mother, and sister.   ROS:   Please see the history of present illness.    ROS All other systems reviewed and are negative.   PHYSICAL EXAM:   VS:  BP 124/68   Pulse 88   Ht 5\' 7"  (1.702 m)   Wt 158 lb (71.7 kg)   SpO2 94%   BMI 24.75 kg/m   Physical Exam  GEN: Well nourished, well developed, in no acute distress  Neck: no JVD, carotid bruits, or masses Cardiac:RRR; no murmurs, rubs, or gallops  Respiratory:  clear to auscultation bilaterally, normal work of breathing GI: soft, nontender, nondistended, + BS Ext: without cyanosis, clubbing, or edema, Good distal pulses bilaterally Neuro:  Alert and Oriented x 3,Psych: euthymic mood, full affect  Wt Readings from Last 3 Encounters:  12/15/20 158 lb (71.7 kg)  09/04/20 149 lb 12.8 oz (67.9 kg)  07/29/20 156 lb (70.8 kg)      Studies/Labs Reviewed:   EKG:  EKG is not ordered today.  Recent Labs: 03/07/2020: B Natriuretic Peptide 985.0 03/08/2020: Hemoglobin 12.8; Magnesium 1.1; Platelets 247 11/09/2020: ALT 14; BUN 16; Creat 0.95; Potassium 4.6; Sodium 132   Lipid Panel    Component Value Date/Time   CHOL 195 07/08/2020 1556   TRIG 120 07/08/2020 1556   HDL 31 (L) 07/08/2020 1556   CHOLHDL 6.3 (H) 07/08/2020 1556   VLDL 35 10/04/2011 0605   LDLCALC 140 (H) 07/08/2020 1556    Additional studies/ records that were reviewed today include:  2Decho 02/2020 IMPRESSIONS     1. Left ventricular ejection fraction, by estimation, is 70 to 75%. The  left  ventricle has hyperdynamic function. The left ventricle has no  regional wall motion abnormalities. There is severe left ventricular  hypertrophy. Left ventricular diastolic  parameters are consistent with Grade I diastolic dysfunction (impaired  relaxation). Elevated left atrial pressure.   2. Right ventricular systolic function is normal. The right ventricular  size is normal.   3. LV contraction is vigorous with near cavity obliteration. There is an  intracavitary gradient that is largest at the LV outflow tract without  evidence of obstruction at rest.   4. Markedly elevated LAP with E/e' 40.   5. Left atrial size was moderately dilated.   6. Mild to moderate mitral regurgitation   7. Tricuspid valve regurgitation is moderate.   8. Severe pulmonary hypertension with RVSP of 49mmHg.   9. The aortic valve is tricuspid. There is mild calcification of the  aortic valve. There is mild thickening of the aortic valve. Aortic valve  regurgitation is not visualized.  10. The inferior vena cava is dilated in size with >50% respiratory  variability, suggesting right atrial pressure of 8 mmHg.   Comparison(s): Compared to prior TTE in 03/09/20, the TR now appears  moderate. Otherwise, there is no significant change.   RU ext arterial dopplers Summary:    Right: No significant arterial obstruction detected in the right         upper extremity.  Left: No significant arterial obstruction detected in the left upper        extremity. See Arterial Duplex study.  *See table(s) above for measurements and observations.    Summary:    Left: >50% stenosis visualized in the left upper extremity.        Obstruction noted in the subclavian artery.          See UE Arterial Doppler study.  *See table(s) above for measurements and observations.  Electronically signed by Larae Grooms MD on 11/02/2019 at 4:37:02 PM.         Final        Risk Assessment/Calculations:          ASSESSMENT:    1. Hypertrophic cardiomyopathy ([place])   2. Essential hypertension   3. PAD (peripheral artery disease) ([place])   4. Tobacco abuse      PLAN:  In order of problems listed above:  HOCM last echo 02/2020 without resting LVOT obstruction-overall doing well without symptoms. Will hold off on repeat echo this year.  HTN with LVH well controlled on losartan and metoprolol  PAD > 50% left subclavian stenosis-to see Dr. Trula Getchell next month  Tobacco abuse-smoking cessation advised  Shared Decision Making/Informed Consent        Medication Adjustments/Labs and Tests Ordered: Current medicines are reviewed at length with the patient today.  Concerns regarding medicines are outlined above.  Medication changes, Labs and Tests ordered today are listed in the Patient Instructions below. Patient Instructions  Medication Instructions:  Your physician recommends that you continue on your current medications as directed. Please refer to the Current Medication list given to you today.  *If you need a refill on your cardiac medications before your next appointment, please call your pharmacy*   Lab Work: None If you have labs (blood work) drawn today and your tests are completely normal, you will receive your results only by: [place] (if you have MyChart) OR A paper copy in the mail If you have any lab test that is abnormal or we need to change your treatment, we will call you to review the results.   Testing/Procedures: None   Follow-Up: At [facility], you and your health needs are our priority.  As part of our continuing mission to provide you with exceptional heart care, we have created designated Provider Care Teams.  These Care Teams include your primary Cardiologist (physician) and Advanced Practice Providers (APPs -  Physician Assistants and Nurse Practitioners) who all work together to provide you with the care you need, when you need it.  We  recommend signing up for the patient portal called "MyChart".  Sign up information is provided on this After Visit Summary.  MyChart is used to connect with patients for Virtual Visits (Telemedicine).  Patients are able to view lab/test results, encounter notes, upcoming appointments, etc.  Non-urgent messages can be sent to your provider as well.   To learn more about what you can do with MyChart, go to [email or web address].    Your next appointment:   1 year(s)  The format for your next appointment:   In Person  Provider:   Rozann Lesches, MD   Other Instructions Health Risks of Smoking Smoking tobacco is very bad for your health. Tobacco smoke contains many toxic chemicals that can damage every part of your body. Secondhand smoke can be harmful to those around you. Tobacco or nicotine use can cause many long-term (chronic) diseases. Smoking is difficult to quit because a chemical in tobacco, called nicotine, causes addiction or dependence. When you smoke and inhale, nicotine is absorbed quickly into the bloodstream through your lungs. Both inhaled and non-inhaled nicotine may be addictive. How can quitting affect me? There are health benefits of quitting smoking. Some benefits happen right away and others take time. Benefits may include: Blood flow, blood pressure, heart rate, and lung capacity may begin to improve. However, any lung damage that has already occurred cannot be repaired. Temporary respiratory  symptoms, such as nasal congestion and cough, may improve over time. Your risk of heart disease, stroke, and cancer is reduced. The overall quality of your health may improve. You may save money, as you will not spend money on tobacco products and may spend less money on smoking-related health issues. What can increase my risk? Smoking harms nearly every organ in the body. People who smoke tobacco have a shorter life expectancy and an increased risk of many serious medical  problems. These include: More respiratory infections, such as colds and pneumonia. Cancer. Heart disease. Stroke. Chronic respiratory diseases. Delayed wound healing and increased risk of complications during surgery. Problems with reproduction, pregnancy, and childbirth, such as infertility, early (premature) births, stillbirths, and birth defects. Secondhand smoke exposure to children increases the risk of: Sudden infant death syndrome (SIDS). Infections in the nose, throat, or airways (respiratory infections). Chronic respiratory symptoms. What actions can I take to quit? Smoking is an addiction that affects both your body and your mind, and long-time habits can be hard to change. Your health care provider can recommend: Nicotine replacement products, such as patches, gum, and nasal sprays. Use these products only as directed. Do not replace cigarette smoking with electronic cigarettes, which are commonly called e-cigarettes. The safety of e-cigarettes is not known, and some may contain harmful chemicals. Programs and community resources, which may include group support, education, or talk therapy. Prescription medicines to help reduce cravings. A combination of two or more quit methods, which will increase the success of quitting. Where to find support Follow the recommendations from your health care provider about support groups and other assistance. You can also visit: [employer]: www.naquitline.org or call 1-800-QUIT-NOW. U.S. Department of Health and Human Services: www.smokefree.gov American Lung Association: www.freedomfromsmoking.org American Heart Association: www.heart.org Where to find more information Centers for Disease Control and Prevention: http://www.wolf.info/ World Health Organization: RoleLink.com.br Summary Smoking tobacco is very bad for your health. Tobacco smoke contains many toxic chemicals that can damage every part of the body. Smoking is difficult  to quit because a chemical in tobacco, called nicotine, causes addiction or dependence. There are immediate and long-term health benefits of quitting smoking. A combination of two or more quit methods increases the success of quitting. This information is not intended to replace advice given to you by your health care provider. Make sure you discuss any questions you have with your health care provider. Document Revised: 04/22/2019 Document Reviewed: 04/22/2019 Elsevier Patient Education  2022 [place].    Two Gram Sodium Diet 2000 mg  What is Sodium? Sodium is a mineral found naturally in many foods. The most significant source of sodium in the diet is table salt, which is about 40% sodium.  Processed, convenience, and preserved foods also contain a large amount of sodium.  The body needs only 500 mg of sodium daily to function,  A normal diet provides more than enough sodium even if you do not use salt.  Why Limit Sodium? A build up of sodium in the body can cause thirst, increased blood pressure, shortness of breath, and water retention.  Decreasing sodium in the diet can reduce edema and risk of heart attack or stroke associated with high blood pressure.  Keep in mind that there are many other factors involved in these health problems.  Heredity, obesity, lack of exercise, cigarette smoking, stress and what you eat all play a role.  General Guidelines: Do not add salt at the table or in cooking.  One  teaspoon of salt contains over 2 grams of sodium. Read food labels Avoid processed and convenience foods Ask your dietitian before eating any foods not dicussed in the menu planning guidelines Consult your physician if you wish to use a salt substitute or a sodium containing medication such as antacids.  Limit milk and milk products to 16 oz (2 cups) per day.  Shopping Hints: READ LABELS!! "Dietetic" does not necessarily mean low sodium. Salt and other sodium ingredients are often added  to foods during processing.    Menu Planning Guidelines Food Group Choose More Often Avoid  Beverages (see also the milk group All fruit juices, low-sodium, salt-free vegetables juices, low-sodium carbonated beverages Regular vegetable or tomato juices, commercially softened water used for drinking or cooking  Breads and Cereals Enriched white, wheat, rye and pumpernickel bread, hard rolls and dinner rolls; muffins, cornbread and waffles; most dry cereals, cooked cereal without added salt; unsalted crackers and breadsticks; low sodium or homemade bread crumbs Bread, rolls and crackers with salted tops; quick breads; instant hot cereals; pancakes; commercial bread stuffing; self-rising flower and biscuit mixes; regular bread crumbs or cracker crumbs  Desserts and Sweets Desserts and sweets mad with mild should be within allowance Instant pudding mixes and cake mixes  Fats Butter or margarine; vegetable oils; unsalted salad dressings, regular salad dressings limited to 1 Tbs; light, sour and heavy cream Regular salad dressings containing bacon fat, bacon bits, and salt pork; snack dips made with instant soup mixes or processed cheese; salted nuts  Fruits Most fresh, frozen and canned fruits Fruits processed with salt or sodium-containing ingredient (some dried fruits are processed with sodium sulfites        Vegetables Fresh, frozen vegetables and low- sodium canned vegetables Regular canned vegetables, sauerkraut, pickled vegetables, and others prepared in brine; frozen vegetables in sauces; vegetables seasoned with ham, bacon or salt pork  Condiments, Sauces, Miscellaneous  Salt substitute with physician's approval; pepper, herbs, spices; vinegar, lemon or lime juice; hot pepper sauce; garlic powder, onion powder, low sodium soy sauce (1 Tbs.); low sodium condiments (ketchup, chili sauce, mustard) in limited amounts (1 tsp.) fresh ground horseradish; unsalted tortilla chips, pretzels, potato  chips, popcorn, salsa (1/4 cup) Any seasoning made with salt including garlic salt, celery salt, onion salt, and seasoned salt; sea salt, rock salt, kosher salt; meat tenderizers; monosodium glutamate; mustard, regular soy sauce, barbecue, sauce, chili sauce, teriyaki sauce, steak sauce, Worcestershire sauce, and most flavored vinegars; canned gravy and mixes; regular condiments; salted snack foods, olives, picles, relish, horseradish sauce, catsup   Food preparation: Try these seasonings Meats:    Pork Sage, onion Serve with applesauce  Chicken Poultry seasoning, thyme, parsley Serve with cranberry sauce  Lamb Curry powder, rosemary, garlic, thyme Serve with mint sauce or jelly  Veal Marjoram, basil Serve with current jelly, cranberry sauce  Beef Pepper, bay leaf Serve with dry mustard, unsalted chive butter  Fish [place] leaf, dill Serve with unsalted lemon butter, unsalted parsley butter  Vegetables:    Asparagus Lemon juice   Broccoli Lemon juice   Carrots Mustard dressing parsley, mint, nutmeg, glazed with unsalted butter and sugar   Green beans Marjoram, lemon juice, nutmeg,dill seed   Tomatoes Basil, marjoram, onion   Spice /blend for [employer]" 4 tsp ground thyme 1 tsp ground sage 3 tsp ground rosemary 4 tsp ground marjoram   Test your knowledge A product that says "Salt Free" may still contain sodium. True or False Garlic Powder and Hot Pepper  Sauce an be used as alternative seasonings.True or False Processed foods have more sodium than fresh foods.  True or False Canned Vegetables have less sodium than froze True or False   WAYS TO DECREASE YOUR SODIUM INTAKE Avoid the use of added salt in cooking and at the table.  Table salt (and other prepared seasonings which contain salt) is probably one of the greatest sources of sodium in the diet.  Unsalted foods can gain flavor from the sweet, sour, and butter taste sensations of herbs and spices.  Instead of using salt for seasoning,  try the following seasonings with the foods listed.  Remember: how you use them to enhance natural food flavors is limited only by your creativity... Allspice-Meat, fish, eggs, fruit, peas, red and yellow vegetables Almond Extract-Fruit baked goods Anise Seed-Sweet breads, fruit, carrots, beets, cottage cheese, cookies (tastes like licorice) Basil-Meat, fish, eggs, vegetables, rice, vegetables salads, soups, sauces Bay Leaf-Meat, fish, stews, poultry Burnet-Salad, vegetables (cucumber-like flavor) Caraway Seed-Bread, cookies, cottage cheese, meat, vegetables, cheese, rice Cardamon-Baked goods, fruit, soups Celery Powder or seed-Salads, salad dressings, sauces, meatloaf, soup, bread.Do not use  celery salt Chervil-Meats, salads, fish, eggs, vegetables, cottage cheese (parsley-like flavor) Chili Power-Meatloaf, chicken cheese, corn, eggplant, egg dishes Chives-Salads cottage cheese, egg dishes, soups, vegetables, sauces Cilantro-Salsa, casseroles Cinnamon-Baked goods, fruit, pork, lamb, chicken, carrots Cloves-Fruit, baked goods, fish, pot roast, green beans, beets, carrots Coriander-Pastry, cookies, meat, salads, cheese (lemon-orange flavor) Cumin-Meatloaf, fish,cheese, eggs, cabbage,fruit pie (caraway flavor) [employer], fruit, eggs, fish, poultry, cottage cheese, vegetables Dill Seed-Meat, cottage cheese, poultry, vegetables, fish, salads, bread Fennel Seed-Bread, cookies, apples, pork, eggs, fish, beets, cabbage, cheese, Licorice-like flavor Garlic-(buds or powder) Salads, meat, poultry, fish, bread, butter, vegetables, potatoes.Do not  use garlic salt Ginger-Fruit, vegetables, baked goods, meat, fish, poultry Horseradish Root-Meet, vegetables, butter Lemon Juice or Extract-Vegetables, fruit, tea, baked goods, fish salads Mace-Baked goods fruit, vegetables, fish, poultry (taste like nutmeg) Maple Extract-Syrups Marjoram-Meat, chicken, fish, vegetables, breads, green salads (taste  like Sage) Mint-Tea, lamb, sherbet, vegetables, desserts, carrots, cabbage Mustard, Dry or Seed-Cheese, eggs, meats, vegetables, poultry Nutmeg-Baked goods, fruit, chicken, eggs, vegetables, desserts Onion Powder-Meat, fish, poultry, vegetables, cheese, eggs, bread, rice salads (Do not use   Onion salt) Orange Extract-Desserts, baked goods Oregano-Pasta, eggs, cheese, onions, pork, lamb, fish, chicken, vegetables, green salads Paprika-Meat, fish, poultry, eggs, cheese, vegetables Parsley Flakes-Butter, vegetables, meat fish, poultry, eggs, bread, salads (certain forms may   Contain sodium Pepper-Meat fish, poultry, vegetables, eggs Peppermint Extract-Desserts, baked goods Poppy Seed-Eggs, bread, cheese, fruit dressings, baked goods, noodles, vegetables, cottage  [employer], poultry, meat, fish, cauliflower, turnips,eggs bread Saffron-Rice, bread, veal, chicken, fish, eggs Sage-Meat, fish, poultry, onions, eggplant, tomateos, pork, stews Savory-Eggs, salads, poultry, meat, rice, vegetables, soups, pork Tarragon-Meat, poultry, fish, eggs, butter, vegetables (licorice-like flavor)  Thyme-Meat, poultry, fish, eggs, vegetables, (clover-like flavor), sauces, soups Tumeric-Salads, butter, eggs, fish, rice, vegetables (saffron-like flavor) Vanilla Extract-Baked goods, candy Vinegar-Salads, vegetables, meat marinades Walnut Extract-baked goods, candy   2. Choose your Foods Wisely   The following is a list of foods to avoid which are high in sodium:  Meats-Avoid all smoked, canned, salt cured, dried and kosher meat and fish as well as Anchovies   [employer] meats:Bologna, Liverwurst, Pastrami Canned meat or fish  Marinated herring Caviar    Pepperoni Corned Beef   Pizza Dried chipped beef  Salami Frozen breaded fish or meat Salt pork Frankfurters or hot dogs  Sardines Gefilte fish   Sausage  Ham (boiled ham, Proscuitto Smoked butt     spiced ham)   Spam      TV Dinners Vegetables Canned vegetables (Regular) Relish Canned mushrooms  Sauerkraut Olives    Tomato juice Pickles  Bakery and Dessert Products Canned puddings  Cream pies Cheesecake   Decorated cakes Cookies  Beverages/Juices Tomato juice, regular  Gatorade   V-8 vegetable juice, regular  Breads and Cereals Biscuit mixes   Salted potato chips, corn chips, pretzels Bread stuffing mixes  Salted crackers and rolls Pancake and waffle mixes Self-rising flour  Seasonings Accent    Meat sauces Barbecue sauce  Meat tenderizer Catsup    Monosodium glutamate (MSG) Celery salt   Onion salt Chili sauce   Prepared mustard Garlic salt   Salt, seasoned salt, sea salt Gravy mixes   Soy sauce Horseradish   Steak sauce Ketchup   Tartar sauce Lite salt    Teriyaki sauce Marinade mixes   Worcestershire sauce  Others Baking powder   Cocoa and cocoa mixes Baking soda   Commercial casserole mixes Candy-caramels, chocolate  Dehydrated soups    Bars, fudge,nougats  Instant rice and pasta mixes Canned broth or soup  Maraschino cherries Cheese, aged and processed cheese and cheese spreads  Learning Assessment Quiz  Indicated T (for True) or F (for False) for each of the following statements:  _____ Fresh fruits and vegetables and unprocessed grains are generally low in sodium _____ Water may contain a considerable amount of sodium, depending on the source _____ You can always tell if a food is high in sodium by tasting it _____ Certain laxatives my be high in sodium and should be avoided unless prescribed   by a physician or pharmacist _____ Salt substitutes may be used freely by anyone on a sodium restricted diet _____ Sodium is present in table salt, food additives and as a natural component of   most foods _____ Table salt is approximately 90% sodium _____ Limiting sodium intake may help prevent excess fluid accumulation in the body _____ On a  sodium-restricted diet, seasonings such as bouillon soy sauce, and    cooking wine should be used in place of table salt _____ On an ingredient list, a product which lists monosodium glutamate as the first   ingredient is an appropriate food to include on a low sodium diet  Circle the best answer(s) to the following statements (Hint: there may be more than one correct answer)  11. On a low-sodium diet, some acceptable snack items are:    A. Olives  F. Bean dip   K. Grapefruit juice    B. Salted Pretzels G. Commercial Popcorn   L. Canned peaches    C. Carrot Sticks  H. Bouillon   M. Unsalted nuts   D. [place] fries  I. Peanut butter crackers N. Salami   E. Sweet pickles J. Tomato Juice   O. Pizza  12.  Seasonings that may be used freely on a reduced - sodium diet include   A. Lemon wedges F.Monosodium glutamate K. Celery seed    B.Soysauce   G. Pepper   L. Mustard powder   C. Sea salt  H. Cooking wine  M. Onion flakes   D. Vinegar  E. Prepared horseradish N. Salsa   E. Sage   J. Worcestershire sauce  O. Chutney     Signed, Ermalinda Barrios, PA-C  12/15/2020 [facility] [place], [place], [place]  [address] Phone: [phone]; Fax: [phone]

## 2020-12-21 ENCOUNTER — Ambulatory Visit: Payer: Medicare HMO | Admitting: Physician Assistant

## 2021-01-05 ENCOUNTER — Other Ambulatory Visit: Payer: Self-pay

## 2021-01-05 DIAGNOSIS — I6523 Occlusion and stenosis of bilateral carotid arteries: Secondary | ICD-10-CM

## 2021-01-05 DIAGNOSIS — J432 Centrilobular emphysema: Secondary | ICD-10-CM | POA: Diagnosis not present

## 2021-01-11 ENCOUNTER — Encounter: Payer: Self-pay | Admitting: Surgery

## 2021-01-11 ENCOUNTER — Ambulatory Visit (HOSPITAL_COMMUNITY)
Admission: RE | Admit: 2021-01-11 | Discharge: 2021-01-11 | Disposition: A | Discharge status: Home / Self Care | Payer: Medicare HMO | Source: Ambulatory Visit | Attending: Surgery | Admitting: Surgery

## 2021-01-11 ENCOUNTER — Ambulatory Visit: Payer: Medicare HMO | Admitting: Surgery

## 2021-01-11 ENCOUNTER — Other Ambulatory Visit: Payer: Self-pay

## 2021-01-11 VITALS — BP 169/80 | HR 76 | Temp 97.4°F | Resp 18 | Ht 67.0 in | Wt 158.0 lb

## 2021-01-11 DIAGNOSIS — I70213 Atherosclerosis of native arteries of extremities with intermittent claudication, bilateral legs: Secondary | ICD-10-CM | POA: Diagnosis not present

## 2021-01-11 DIAGNOSIS — I6523 Occlusion and stenosis of bilateral carotid arteries: Secondary | ICD-10-CM | POA: Diagnosis not present

## 2021-01-11 MED ORDER — CILOSTAZOL 100 MG PO TABS: 100.0000 mg | ORAL_TABLET | Freq: Two times a day (BID) | ORAL | 11 refills | Status: DC

## 2021-01-11 NOTE — Progress Notes (Signed)
[facility]  Patient name: Leslie [name] MRN: [id number] DOB: 12-Sep-1945 Sex: female   REASON FOR VISIT:    Follow up  HISOTRY OF PRESENT ILLNESS:    Leslie [name] is a 75 y.o. female, who I have not seen for 6+ years.  She had moved out of town but is now back.  She is status post right carotid endarterectomy with bovine patch angioplasty for asymptomatic stenosis on 04/20/2012.  Intraoperative findings included a diffuse long segment stenosis which went from her skull base down to her clavicle.  Currently she has no symptoms.  Specifically she denies numbness or weakness in either extremity.  She denies slurred speech.  She denies amaurosis fugax.  She has had some minor difficulty with word finding since surgery   I had followed the patient in the past for claudication.  She has a known occlusion of the right superficial femoral artery stent.  Her symptoms have been tolerable.  She does not have any open wounds.  She remains on cilostazol   She takes a statin for hypercholesterolemia.  She is medically managed for hypertension.  She is a current smoker   PAST MEDICAL HISTORY:   Past Medical History:  Diagnosis Date   Back pain    DDD   Carotid artery occlusion    Cough    Diastolic heart failure (HCC)    Dry skin    Essential hypertension    History of bruising easily    History of colon polyps    History of migraine    History of pneumonia    Hyperlipidemia    PAD (peripheral artery disease) (HCC)    Panic attack    Rheumatoid arthritis(714.0)      FAMILY HISTORY:   Family History  Problem Relation Age of Onset   Hyperlipidemia Mother    Hypertension Mother    Cancer Father        Brain   Diabetes Sister    Hypertension Sister    Diabetes Brother    Heart disease Brother    Hypertension Brother    Heart attack Brother    Colon cancer Neg Hx     SOCIAL HISTORY:   Social History   Tobacco  Use   Smoking status: Every Day    Packs/day: 0.50    Years: 50.00    Pack years: 25.00    Types: Cigarettes    Start date: 1961   Smokeless tobacco: Never   Tobacco comments:    Smoke 1/2 pack per day 09/04/20  Substance Use Topics   Alcohol use: Not Currently    Comment: occassionally     ALLERGIES:   Allergies  Allergen Reactions   Latex Other (See Comments)    Burns skin    Tape Other (See Comments) and Itching    Burns skin    Atorvastatin Other (See Comments)    myalgia myalgia     CURRENT MEDICATIONS:   Current Outpatient Medications  Medication Sig Dispense Refill   albuterol (PROVENTIL) (2.5 MG/3ML) 0.083% nebulizer solution Take 3 mLs (2.5 mg total) by nebulization every 6 (six) hours as needed for wheezing or shortness of breath. 75 mL 12   albuterol (VENTOLIN HFA) 108 (90 Base) MCG/ACT inhaler INHALE 2 INHALATIONS INTO THE LUNGS EVERY 6 HOURS AS NEEDED FOR WHEEZING (Patient taking differently: Inhale 2 puffs into the lungs every 6 (six) hours as needed for wheezing.) 54 g 6   aspirin EC 81  MG tablet Take 81 mg by mouth daily.     buPROPion (ZYBAN) 150 MG 12 hr tablet Take 1 tablet (150 mg total) by mouth 2 (two) times daily. 60 tablet 3   cilostazol (PLETAL) 100 MG tablet TAKE 1 TABLET(100 MG) BY MOUTH TWICE DAILY 180 tablet 0   furosemide (LASIX) 20 MG tablet Take 1 tablet (20 mg total) by mouth daily as needed. 90 tablet 3   IBU 800 MG tablet TAKE 1 TABLET EVERY DAY AS NEEDED FOR MODERATE PAIN. 90 tablet 0   losartan (COZAAR) 50 MG tablet Take 1 tablet (50 mg total) by mouth in the morning and at bedtime. 180 tablet 0   metoprolol succinate (TOPROL-XL) 25 MG 24 hr tablet Take 1 tablet (25 mg total) by mouth daily. 90 tablet 3   pantoprazole (PROTONIX) 40 MG tablet TAKE 1 TABLET EVERY DAY 90 tablet 0   potassium chloride SA (KLOR-CON) 20 MEQ tablet Take 1 tablet (20 mEq total) by mouth daily as needed. 90 tablet 3   rosuvastatin (CRESTOR) 10 MG tablet Take 1  tablet (10 mg total) by mouth daily. 90 tablet 0   No current facility-administered medications for this visit.    REVIEW OF SYSTEMS:   [X]  denotes positive finding, [ ]  denotes negative finding Cardiac  Comments:  Chest pain or chest pressure:    Shortness of breath upon exertion:    Short of breath when lying flat:    Irregular heart rhythm:        Vascular    Pain in calf, thigh, or hip brought on by ambulation:    Pain in feet at night that wakes you up from your sleep:     Blood clot in your veins:    Leg swelling:         Pulmonary    Oxygen at home:    Productive cough:     Wheezing:         Neurologic    Sudden weakness in arms or legs:     Sudden numbness in arms or legs:     Sudden onset of difficulty speaking or slurred speech:    Temporary loss of vision in one eye:     Problems with dizziness:         Gastrointestinal    Blood in stool:     Vomited blood:         Genitourinary    Burning when urinating:     Blood in urine:        Psychiatric    Major depression:         Hematologic    Bleeding problems:    Problems with blood clotting too easily:        Skin    Rashes or ulcers:        Constitutional    Fever or chills:      PHYSICAL EXAM:   Vitals:   01/11/21 1348 01/11/21 1351  BP: 126/86 (!) 169/80  Pulse: 76   Resp: 18   Temp: (!) 97.4 F (36.3 C)   SpO2: 96%   Weight: 158 lb (71.7 kg)   Height: 5\' 7"  (1.702 m)     GENERAL: The patient is a well-nourished female, in no acute distress. The vital signs are documented above. CARDIAC: There is a regular rate and rhythm.  PULMONARY: Non-labored respirations MUSCULOSKELETAL: There are no major deformities or cyanosis. NEUROLOGIC: No focal weakness or paresthesias are detected. SKIN: There are no  ulcers or rashes noted. PSYCHIATRIC: The patient has a normal affect.  STUDIES:   I have reviewed the following studies: Right Carotid: Velocities in the right ICA are consistent with a  1-39%  stenosis.                 Non-hemodynamically significant plaque <50% noted in the  CCA.   Left Carotid: Velocities in the left ICA are consistent with a 40-59%  stenosis.                Non-hemodynamically significant plaque <50% noted in the  CCA. The                ECA appears >50% stenosed.   Vertebrals:  Bilateral vertebral arteries demonstrate antegrade flow.  Subclavians: Normal flow hemodynamics were seen in bilateral subclavian               arteries.   MEDICAL ISSUES:   Claudication: I refilled her cilostazol today.  Her symptoms are tolerable.  She knows to contact me if she develops a wound on her leg.  Otherwise, she will get a ABI in 1 year  Carotid: Ultrasound shows widely patent right carotid endarterectomy site and 40-59% stenosis on the left.  She remains asymptomatic.  She will follow-up in 1 year with repeat carotid duplex    Annamarie Major, IV, MD, FACS [facility] [phone] Pager [phone]

## 2021-01-18 ENCOUNTER — Ambulatory Visit: Payer: Medicare HMO | Admitting: Physician Assistant

## 2021-01-22 DIAGNOSIS — M48062 Spinal stenosis, lumbar region with neurogenic claudication: Secondary | ICD-10-CM | POA: Diagnosis not present

## 2021-01-28 ENCOUNTER — Other Ambulatory Visit: Payer: Self-pay

## 2021-01-28 ENCOUNTER — Encounter: Payer: Self-pay | Admitting: Nurse Practitioner

## 2021-01-28 ENCOUNTER — Other Ambulatory Visit: Payer: Self-pay | Admitting: Nurse Practitioner

## 2021-01-28 ENCOUNTER — Ambulatory Visit (INDEPENDENT_AMBULATORY_CARE_PROVIDER_SITE_OTHER): Payer: Medicare HMO | Admitting: Nurse Practitioner

## 2021-01-28 VITALS — BP 144/81 | HR 79 | Ht 67.0 in | Wt 159.0 lb

## 2021-01-28 DIAGNOSIS — M545 Low back pain, unspecified: Secondary | ICD-10-CM | POA: Diagnosis not present

## 2021-01-28 DIAGNOSIS — Z720 Tobacco use: Secondary | ICD-10-CM | POA: Diagnosis not present

## 2021-01-28 DIAGNOSIS — I10 Essential (primary) hypertension: Secondary | ICD-10-CM | POA: Diagnosis not present

## 2021-01-28 DIAGNOSIS — E782 Mixed hyperlipidemia: Secondary | ICD-10-CM

## 2021-01-28 DIAGNOSIS — M069 Rheumatoid arthritis, unspecified: Secondary | ICD-10-CM | POA: Diagnosis not present

## 2021-01-28 MED ORDER — PANTOPRAZOLE SODIUM 40 MG PO TBEC: 40.0000 mg | DELAYED_RELEASE_TABLET | Freq: Every day | ORAL | 0 refills | Status: DC

## 2021-01-28 MED ORDER — IBUPROFEN 800 MG PO TABS: ORAL_TABLET | ORAL | 0 refills | Status: DC

## 2021-01-28 NOTE — Assessment & Plan Note (Signed)
Smoking cessation provided. Not willing to quit at this time. Pill made her sick when she took them. Will revisit topic at next visit.

## 2021-01-28 NOTE — Assessment & Plan Note (Signed)
No changes made to medication.Importance of healthy eating habits including low fat/ low sodium diet discussed with patient.

## 2021-01-28 NOTE — Progress Notes (Signed)
New Patient Office Visit  Subjective:  Patient ID: Leslie [name], female    DOB: 1945/04/06  Age: 75 y.o. MRN: [id number]  CC:  Chief Complaint  Patient presents with   New Patient (Initial Visit)    New pt. Previous PCP Dr. Anastasio Champion. Needs refill on Pantoprazole    HPI Leslie [name] presents to establish care. PMH reviewed  Lumbar stenosis  sees spinal specialist, she stated that she will get an injection on Nov 17th,2022  HF, Aortic atherosclerosis. Sees Dr Conni Elliot.   Carotid stenosis, PVD Followed by Dr. Trula Mountjoy  COPD . She is been followed  by Dr. Halford Chessman.   Past Medical History:  Diagnosis Date   Back pain    DDD   Carotid artery occlusion    Cough    Diastolic heart failure (HCC)    Dry skin    Essential hypertension    History of bruising easily    History of colon polyps    History of migraine    History of pneumonia    Hyperlipidemia    PAD (peripheral artery disease) (HCC)    Panic attack    Rheumatoid arthritis(714.0)     Past Surgical History:  Procedure Laterality Date   ABDOMINAL HYSTERECTOMY     ANGIOPLASTY  04/20/2012   Procedure: ANGIOPLASTY;  Surgeon: Serafina Mitchell, MD;  Location: [facility];  Service: Vascular;  Laterality: Right;  Using 1cm x6 cm vascu-guard patch    [place]  01/31/2005   CAROTID ANGIOGRAM N/A 01/06/2012   Procedure: CAROTID ANGIOGRAM;  Surgeon: Lorretta Harp, MD;  Location: [facility] CATH LAB;  Service: Cardiovascular;  Laterality: N/A;   CARPAL TUNNEL RELEASE     CHOLECYSTECTOMY     COLONOSCOPY     CORONARY ANGIOPLASTY  01/06/12/2005/2006    2 stents   EGD/TCS  03/2004   Dr. Gala Romney: Tiny distal esophageal erosions, prepyloric antral erosions/ulcerations, internal hemorrhoids, diminutive polyp in the rectosigmoid junction removed (hyperplastic).  Pathology not available but reportedly she had H. pylori which was treated.   ENDARTERECTOMY  04/20/2012   Procedure: ENDARTERECTOMY CAROTID;  Surgeon: Serafina Mitchell, MD;  Location: [facility];  Service: Vascular;  Laterality: Right;   ESOPHAGOGASTRODUODENOSCOPY  07/2004   Dr. Gala Romney: Previous antral ulcer was completely healed.  EGD normal.   GIVENS CAPSULE STUDY     ???   leg stents     LOWER EXTREMITY ANGIOGRAM N/A 01/06/2012   Procedure: LOWER EXTREMITY ANGIOGRAM;  Surgeon: Lorretta Harp, MD;  Location: [facility] CATH LAB;  Service: Cardiovascular;  Laterality: N/A;   TOTAL KNEE ARTHROPLASTY     TRIGGER FINGER RELEASE     ulnar nerve surgery      Family History  Problem Relation Age of Onset   Hyperlipidemia Mother    Hypertension Mother    Cancer Father        Brain   Diabetes Sister    Hypertension Sister    Diabetes Brother    Heart disease Brother    Hypertension Brother    Heart attack Brother    Colon cancer Neg Hx     Social History   Socioeconomic History   Marital status: Divorced    Spouse name: Not on file   Number of children: Not on file   Years of education: Not on file   Highest education level: Not on file  Occupational History    Comment: not working currently  Tobacco  Use   Smoking status: Every Day    Packs/day: 0.50    Years: 50.00    Pack years: 25.00    Types: Cigarettes    Start date: 1961   Smokeless tobacco: Never   Tobacco comments:    Smoke 1/2 pack per day 09/04/20. Not willing to quit now.  Vaping Use   Vaping Use: Never used  Substance and Sexual Activity   Alcohol use: Yes    Comment: occassionally   Drug use: No   Sexual activity: Not on file  Other Topics Concern   Not on file  Social History Narrative   Divorced.Lives alone.Works at [employer].   Social Determinants of Health   Financial Resource Strain: Not on file  Food Insecurity: Not on file  Transportation Needs: Not on file  Physical Activity: Not on file  Stress: Not on file  Social Connections: Not on file  Intimate Partner Violence: Not on file    ROS Review of Systems  Constitutional: Negative.   Respiratory:  Negative.    Cardiovascular: Negative.   Genitourinary: Negative.   Psychiatric/Behavioral: Negative.     Objective:   Today's Vitals: BP (!) 144/81 (BP Location: Left Arm, Patient Position: Sitting, Cuff Size: Normal)   Pulse 79   Ht 5' 7"  (1.702 m)   Wt 159 lb (72.1 kg)   SpO2 91%   BMI 24.90 kg/m   Physical Exam Vitals and nursing note reviewed.  Constitutional:      Appearance: Normal appearance. She is obese.  Cardiovascular:     Rate and Rhythm: Regular rhythm.  Pulmonary:     Effort: Pulmonary effort is normal. No respiratory distress.     Breath sounds: Normal breath sounds. No wheezing, rhonchi or rales.  Abdominal:     Palpations: Abdomen is soft.     Tenderness: There is no abdominal tenderness.  Neurological:     Mental Status: She is alert.  Psychiatric:        Behavior: Behavior normal.        Thought Content: Thought content normal.        Judgment: Judgment normal.    Assessment & Plan:   Problem List Items Addressed This Visit       Cardiovascular and Mediastinum   Hypertension - Primary    No changes made to medication.Importance of healthy eating habits including low fat/ low sodium diet discussed with patient.       Relevant Orders   CMP14+EGFR     Other   HLD (hyperlipidemia)    Continue Crestor 10 mg daily.  Importance of healthy eating,avoiding fatty fried foods, cheese butter reviewed with pt.  daily exercise discussed with pt.  Check  Lipid level at next visit.       Relevant Orders   CMP14+EGFR   Lipid Profile   Tobacco abuse    Smoking cessation provided. Not willing to quit at this time. Pill made her sick when she took them. Will revisit topic at next visit.       Other Visit Diagnoses     Low back pain without sciatica, unspecified back pain laterality, unspecified chronicity           Outpatient Encounter Medications as of 01/28/2021  Medication Sig   albuterol (PROVENTIL) (2.5 MG/3ML) 0.083% nebulizer solution  Take 3 mLs (2.5 mg total) by nebulization every 6 (six) hours as needed for wheezing or shortness of breath.   albuterol (VENTOLIN HFA) 108 (90 Base) MCG/ACT inhaler INHALE  2 INHALATIONS INTO THE LUNGS EVERY 6 HOURS AS NEEDED FOR WHEEZING (Patient taking differently: Inhale 2 puffs into the lungs every 6 (six) hours as needed for wheezing.)   aspirin EC 81 MG tablet Take 81 mg by mouth daily.   cilostazol (PLETAL) 100 MG tablet Take 1 tablet (100 mg total) by mouth 2 (two) times daily before a meal.   furosemide (LASIX) 20 MG tablet Take 1 tablet (20 mg total) by mouth daily as needed.   IBU 800 MG tablet TAKE 1 TABLET EVERY DAY AS NEEDED FOR MODERATE PAIN.   losartan (COZAAR) 50 MG tablet Take 1 tablet (50 mg total) by mouth in the morning and at bedtime.   metoprolol succinate (TOPROL-XL) 25 MG 24 hr tablet Take 1 tablet (25 mg total) by mouth daily.   potassium chloride SA (KLOR-CON) 20 MEQ tablet Take 1 tablet (20 mEq total) by mouth daily as needed.   rosuvastatin (CRESTOR) 10 MG tablet Take 1 tablet (10 mg total) by mouth daily.   [DISCONTINUED] pantoprazole (PROTONIX) 40 MG tablet TAKE 1 TABLET EVERY DAY   pantoprazole (PROTONIX) 40 MG tablet Take 1 tablet (40 mg total) by mouth daily.   [DISCONTINUED] buPROPion (ZYBAN) 150 MG 12 hr tablet Take 1 tablet (150 mg total) by mouth 2 (two) times daily.   No facility-administered encounter medications on file as of 01/28/2021.    Follow-up: Return in about 3 months (around 04/30/2021).   Renee Rival, FNP

## 2021-01-28 NOTE — Patient Instructions (Signed)
  Pls get your fasting labs 5 days before your next visit.    Maintain a healthy weight.  Every week do at least 150 minutes of moderate physical activity such as brisk walking, or 75 minutes of vigorous activities  Stop exercise and rest  if you develop chest pain or shortness of breath. Call 911 if symptom is not relived after rest.   Eat high protein,fruits and vegetables. Lower carb meals to control hunger and appetite. Avoid or eat fewer carbs like bread, pasta, rice, deserts, sugary beverages and juice.  Choose carbs that are higher in fiber and lower in added sugar fr example say yes to beans and sweet potatoes, say no to sugary drinks and chips.   Avoid fatty/ fried foods.  Please continue to work on smoking cessation.   Thanks for choosing [facility]. Marland Kitchen

## 2021-01-28 NOTE — Assessment & Plan Note (Addendum)
Continue Crestor 10 mg daily.  Importance of healthy eating,avoiding fatty fried foods, cheese butter reviewed with pt.  daily exercise discussed with pt.  Check  Lipid level at next visit.

## 2021-02-04 DIAGNOSIS — M5116 Intervertebral disc disorders with radiculopathy, lumbar region: Secondary | ICD-10-CM | POA: Diagnosis not present

## 2021-02-04 DIAGNOSIS — M5416 Radiculopathy, lumbar region: Secondary | ICD-10-CM | POA: Diagnosis not present

## 2021-03-30 ENCOUNTER — Other Ambulatory Visit: Payer: Self-pay

## 2021-03-30 ENCOUNTER — Ambulatory Visit (INDEPENDENT_AMBULATORY_CARE_PROVIDER_SITE_OTHER): Payer: Medicare HMO

## 2021-03-30 DIAGNOSIS — Z0100 Encounter for examination of eyes and vision without abnormal findings: Secondary | ICD-10-CM

## 2021-03-30 DIAGNOSIS — Z1231 Encounter for screening mammogram for malignant neoplasm of breast: Secondary | ICD-10-CM

## 2021-03-30 DIAGNOSIS — Z780 Asymptomatic menopausal state: Secondary | ICD-10-CM

## 2021-03-30 DIAGNOSIS — Z0001 Encounter for general adult medical examination with abnormal findings: Secondary | ICD-10-CM | POA: Diagnosis not present

## 2021-03-30 DIAGNOSIS — Z0000 Encounter for general adult medical examination without abnormal findings: Secondary | ICD-10-CM

## 2021-03-30 NOTE — Progress Notes (Signed)
Subjective:   Leslie [name] is a 76 y.o. female who presents for an Initial Medicare Annual Wellness Visit. I connected with  KELSA JAWOROWSKI on 03/30/21 by a audio enabled telemedicine application and verified that I am speaking with the correct person using two identifiers.  Patient Location: Home  Provider Location: Office/Clinic  I discussed the limitations of evaluation and management by telemedicine. The patient expressed understanding and agreed to proceed.  Review of Systems    Defer to PCP Cardiac Risk Factors include: advanced age (>68men, >8 women);smoking/ tobacco exposure     Objective:    Today's Vitals   03/30/21 1552  PainSc: 8    There is no height or weight on file to calculate BMI.  Advanced Directives 03/30/2021 03/07/2020 09/18/2019 03/20/2015 03/19/2015 04/21/2012 04/20/2012  Does Patient Have a Medical Advance Directive? No No No No No Patient does not have advance directive Patient does not have advance directive  Would patient like information on creating a medical advance directive? No - Patient declined No - Patient declined - - No - patient declined information - -  Pre-existing out of facility DNR order (yellow form or pink MOST form) - - - - - No -    Current Medications (verified) Outpatient Encounter Medications as of 03/30/2021  Medication Sig   albuterol (PROVENTIL) (2.5 MG/3ML) 0.083% nebulizer solution Take 3 mLs (2.5 mg total) by nebulization every 6 (six) hours as needed for wheezing or shortness of breath.   albuterol (VENTOLIN HFA) 108 (90 Base) MCG/ACT inhaler INHALE 2 INHALATIONS INTO THE LUNGS EVERY 6 HOURS AS NEEDED FOR WHEEZING (Patient taking differently: Inhale 2 puffs into the lungs every 6 (six) hours as needed for wheezing.)   aspirin EC 81 MG tablet Take 81 mg by mouth daily.   cilostazol (PLETAL) 100 MG tablet Take 1 tablet (100 mg total) by mouth 2 (two) times daily before a meal.   ibuprofen (IBU) 800 MG tablet TAKE 1 TABLET EVERY  DAY AS NEEDED FOR MODERATE PAIN.   losartan (COZAAR) 50 MG tablet Take 1 tablet (50 mg total) by mouth in the morning and at bedtime.   metoprolol succinate (TOPROL-XL) 25 MG 24 hr tablet Take 1 tablet (25 mg total) by mouth daily.   pantoprazole (PROTONIX) 40 MG tablet Take 1 tablet (40 mg total) by mouth daily.   potassium chloride SA (KLOR-CON) 20 MEQ tablet Take 1 tablet (20 mEq total) by mouth daily as needed.   rosuvastatin (CRESTOR) 10 MG tablet Take 1 tablet (10 mg total) by mouth daily.   furosemide (LASIX) 20 MG tablet Take 1 tablet (20 mg total) by mouth daily as needed.   No facility-administered encounter medications on file as of 03/30/2021.    Allergies (verified) Latex, Tape, and Atorvastatin   History: Past Medical History:  Diagnosis Date   Back pain    DDD   Carotid artery occlusion    Cough    Diastolic heart failure (HCC)    Dry skin    Essential hypertension    History of bruising easily    History of colon polyps    History of migraine    History of pneumonia    Hyperlipidemia    PAD (peripheral artery disease) (HCC)    Panic attack    Rheumatoid arthritis(714.0)    Past Surgical History:  Procedure Laterality Date   ABDOMINAL HYSTERECTOMY     ANGIOPLASTY  04/20/2012   Procedure: ANGIOPLASTY;  Surgeon: Serafina Mitchell, MD;  Location: [facility] OR;  Service: Vascular;  Laterality: Right;  Using 1cm x6 cm vascu-guard patch    [place]  01/31/2005   CAROTID ANGIOGRAM N/A 01/06/2012   Procedure: CAROTID ANGIOGRAM;  Surgeon: Lorretta Harp, MD;  Location[facility] CATH LAB;  Service: Cardiovascular;  Laterality: N/A;   CARPAL TUNNEL RELEASE     CHOLECYSTECTOMY     COLONOSCOPY     CORONARY ANGIOPLASTY  01/06/12/2005/2006    2 stents   EGD/TCS  03/2004   Dr. Gala Romney: Tiny distal esophageal erosions, prepyloric antral erosions/ulcerations, internal hemorrhoids, diminutive polyp in the rectosigmoid junction removed (hyperplastic).   Pathology not available but reportedly she had H. pylori which was treated.   ENDARTERECTOMY  04/20/2012   Procedure: ENDARTERECTOMY CAROTID;  Surgeon: Serafina Mitchell, MD;  Location: [place];  Service: Vascular;  Laterality: Right;   ESOPHAGOGASTRODUODENOSCOPY  07/2004   Dr. Gala Romney: Previous antral ulcer was completely healed.  EGD normal.   GIVENS CAPSULE STUDY     ???   leg stents     LOWER EXTREMITY ANGIOGRAM N/A 01/06/2012   Procedure: LOWER EXTREMITY ANGIOGRAM;  Surgeon: Lorretta Harp, MD;  Location: [facility] CATH LAB;  Service: Cardiovascular;  Laterality: N/A;   TOTAL KNEE ARTHROPLASTY     TRIGGER FINGER RELEASE     ulnar nerve surgery     Family History  Problem Relation Age of Onset   Hyperlipidemia Mother    Hypertension Mother    Cancer Father        Brain   Diabetes Sister    Hypertension Sister    Diabetes Brother    Heart disease Brother    Hypertension Brother    Heart attack Brother    Colon cancer Neg Hx    Social History   Socioeconomic History   Marital status: Divorced    Spouse name: Not on file   Number of children: 2   Years of education: 12   Highest education level: Not on file  Occupational History    Comment: not working currently   Occupation: retired  Tobacco Use   Smoking status: Every Day    Packs/day: 0.50    Years: 50.00    Pack years: 25.00    Types: Cigarettes    Start date: 1961   Smokeless tobacco: Never   Tobacco comments:    Smoke 1/2 pack per day 09/04/20. Not willing to quit now.  Vaping Use   Vaping Use: Never used  Substance and Sexual Activity   Alcohol use: Not Currently   Drug use: No   Sexual activity: Yes  Other Topics Concern   Not on file  Social History Narrative   Divorced.Lives alone.Works at [employer].   Social Determinants of Health   Financial Resource Strain: Medium Risk   Difficulty of Paying Living Expenses: Somewhat hard  Food Insecurity: No Food Insecurity   Worried About Charity fundraiser in  the Last Year: Never true   Ran Out of Food in the Last Year: Never true  Transportation Needs: No Transportation Needs   Lack of Transportation (Medical): No   Lack of Transportation (Non-Medical): No  Physical Activity: Inactive   Days of Exercise per Week: 0 days   Minutes of Exercise per Session: 0 min  Stress: No Stress Concern Present   Feeling of Stress : Not at all  Social Connections: Moderately Integrated   Frequency of Communication with Friends and Family: More than three  times a week   Frequency of Social Gatherings with Friends and Family: Three times a week   Attends Religious Services: More than 4 times per year   Active Member of Clubs or Organizations: Yes   Attends Archivist Meetings: Never   Marital Status: Divorced    Tobacco Counseling Ready to quit: Not Answered Counseling given: Not Answered Tobacco comments: Smoke 1/2 pack per day 09/04/20. Not willing to quit now.   Clinical Intake:  Pre-visit preparation completed: No  Pain : 0-10 Pain Score: 8  Pain Type: Acute pain Pain Location: Back Pain Orientation: Lower Pain Radiating Towards: does not radiate Pain Descriptors / Indicators: Aching, Throbbing Pain Onset: More than a month ago Pain Frequency: Occasional     Nutritional Risks: None Diabetes: No  How often do you need to have someone help you when you read instructions, pamphlets, or other written materials from your doctor or pharmacy?: 1 - Never What is the last grade level you completed in school?: 12  Diabetic?no  Interpreter Needed?: No  Information entered by :: [place] of Daily Living In your present state of health, do you have any difficulty performing the following activities: 03/30/2021  Hearing? N  Vision? N  Difficulty concentrating or making decisions? N  Walking or climbing stairs? Y  Dressing or bathing? N  Doing errands, shopping? N  Preparing Food and eating ? N  Using the Toilet? N   In the past six months, have you accidently leaked urine? N  Do you have problems with loss of bowel control? N  Managing your Medications? N  Managing your Finances? N  Housekeeping or managing your Housekeeping? N  Some recent data might be hidden    Patient Care Team: Renee Rival, FNP as PCP - General Satira Sark, MD as PCP - Cardiology (Cardiology) Clark-Burning, Anderson Malta, PA-C (Inactive) (Dermatology)  Indicate any recent Medical Services you may have received from other than [facility] providers in the past year (date may be approximate).     Assessment:   This is a routine wellness examination for Leslie [name].  Hearing/Vision screen No results found.  Dietary issues and exercise activities discussed: Current Exercise Habits: The patient does not participate in regular exercise at present, Exercise limited by: cardiac condition(s);orthopedic condition(s)   Goals Addressed   None   Depression Screen PHQ 2/9 Scores 03/30/2021 01/28/2021 01/28/2021 07/29/2020 05/28/2020 06/18/2019 02/04/2019  PHQ - 2 Score 0 0 0 0 0 0 0  PHQ- 9 Score - - - 0 0 - -  Exception Documentation - - - - - Medical reason -    Fall Risk Fall Risk  03/30/2021 01/28/2021 07/29/2020 05/28/2020 06/18/2019  Falls in the past year? 0 0 0 0 0  Number falls in past yr: 0 0 - - 0  Injury with Fall? 0 0 - - 0  Risk for fall due to : No Fall Risks No Fall Risks - - No Fall Risks  Follow up Falls evaluation completed Falls evaluation completed - - Falls evaluation completed    FALL RISK PREVENTION PERTAINING TO THE HOME:  Any stairs in or around the home? Yes  If so, are there any without handrails? Yes  Home free of loose throw rugs in walkways, pet beds, electrical cords, etc? Yes  Adequate lighting in your home to reduce risk of falls? Yes   ASSISTIVE DEVICES UTILIZED TO PREVENT FALLS:  Life alert? No  Use of a cane, walker or  w/c? No  Grab bars in the bathroom? Yes  Shower chair or bench in  shower? No  Elevated toilet seat or a handicapped toilet? Yes     Cognitive Function:     6CIT Screen 03/30/2021  What Year? 0 points  What month? 0 points  What time? 0 points  Count back from 20 0 points  Months in reverse 0 points  Repeat phrase 0 points  Total Score 0    Immunizations Immunization History  Administered Date(s) Administered   Fluad Quad(high Dose 65+) 03/19/2019   Influenza, High Dose Seasonal PF 02/01/2018   Influenza,inj,Quad PF,6+ Mos 03/21/2015, 04/21/2016, 03/28/2017, 12/15/2020   Influenza-Unspecified 03/21/2015   Moderna Sars-Covid-2 Vaccination 04/27/2019, 05/28/2019   Pneumococcal Conjugate-13 09/15/2016, 09/16/2017   Pneumococcal Polysaccharide-23 09/15/2016   Tdap 09/15/2016    TDAP status: Up to date  Flu Vaccine status: Up to date  Pneumococcal vaccine status: Up to date  Covid-19 vaccine status: Information provided on how to obtain vaccines.   Qualifies for Shingles Vaccine? Yes   Zostavax completed No   Shingrix Completed?: No.    Education has been provided regarding the importance of this vaccine. Patient has been advised to call insurance company to determine out of pocket expense if they have not yet received this vaccine. Advised may also receive vaccine at local pharmacy or Health Dept. Verbalized acceptance and understanding.  Screening Tests Health Maintenance  Topic Date Due   MAMMOGRAM  03/12/2021   Zoster Vaccines- Shingrix (1 of 2) 04/30/2021 (Originally 11/25/1996)   COVID-19 Vaccine (3 - Booster for Moderna series) 02/13/2022 (Originally 07/23/2019)   TETANUS/TDAP  09/16/2026   COLONOSCOPY (Pts 45-45yrs Insurance coverage will need to be confirmed)  02/04/2027   Pneumonia Vaccine 15+ Years old  Completed   INFLUENZA VACCINE  Completed   DEXA SCAN  Completed   Hepatitis C Screening  Completed   HPV VACCINES  Aged Out    Health Maintenance  Health Maintenance Due  Topic Date Due   MAMMOGRAM  03/12/2021     Colorectal cancer screening: Type of screening: Colonoscopy. Completed 02/03/2017. Repeat every 10 years  Mammogram status: Ordered 03/30/2021. Pt provided with contact info and advised to call to schedule appt.   Bone Density status: Completed 02/04/2019. Results reflect: Bone density results: NORMAL. Repeat every pt states normal Bone density test in 2020  2 years.  Lung Cancer Screening: (Low Dose CT Chest recommended if Age 93-80 years, 30 pack-year currently smoking OR have quit w/in 15years.) does not qualify.   Lung Cancer Screening Referral: n/a  Additional Screening:  Hepatitis C Screening: does qualify; Completed 09/03/2019  Vision Screening: Recommended annual ophthalmology exams for early detection of glaucoma and other disorders of the eye. Is the patient up to date with their annual eye exam?  No  Who is the provider or what is the name of the office in which the patient attends annual eye exams? N/a If pt is not established with a provider, would they like to be referred to a provider to establish care? Yes .   Dental Screening: Recommended annual dental exams for proper oral hygiene  Community Resource Referral / Chronic Care Management: CRR required this visit?  No   CCM required this visit?  No      Plan:     I have personally reviewed and noted the following in the patients chart:   Medical and social history Use of alcohol, tobacco or illicit drugs  Current medications and supplements including  opioid prescriptions. Patient is not currently taking opioid prescriptions. Functional ability and status Nutritional status Physical activity Advanced directives List of other physicians Hospitalizations, surgeries, and ER visits in previous 12 months Vitals Screenings to include cognitive, depression, and falls Referrals and appointments  In addition, I have reviewed and discussed with patient certain preventive protocols, quality metrics, and best  practice recommendations. A written personalized care plan for preventive services as well as general preventive health recommendations were provided to patient.     Earline May[place], [place]   03/30/2021   Nurse Notes:  Leslie [name] , Thank you for taking time to come for your Medicare Wellness Visit. I appreciate your ongoing commitment to your health goals. Please review the following plan we discussed and let me know if I can assist you in the future.   These are the goals we discussed:  Goals   None     This is a list of the screening recommended for you and due dates:  Health Maintenance  Topic Date Due   Mammogram  03/12/2021   Zoster (Shingles) Vaccine (1 of 2) 04/30/2021*   COVID-19 Vaccine (3 - Booster for Moderna series) 02/13/2022*   Tetanus Vaccine  09/16/2026   Colon Cancer Screening  02/04/2027   Pneumonia Vaccine  Completed   Flu Shot  Completed   DEXA scan (bone density measurement)  Completed   Hepatitis C Screening: USPSTF Recommendation to screen - Ages 58-79 yo.  Completed   HPV Vaccine  Aged Out  *Topic was postponed. The date shown is not the original due date.

## 2021-03-30 NOTE — Patient Instructions (Addendum)
Leslie [name] , Thank you for taking time to come for your Medicare Wellness Visit. I appreciate your ongoing commitment to your health goals. Please review the following plan we discussed and let me know if I can assist you in the future.   These are the goals we discussed:  Goals   None     This is a list of the screening recommended for you and due dates:  Health Maintenance  Topic Date Due   Mammogram  03/12/2021   Zoster (Shingles) Vaccine (1 of 2) 04/30/2021*   COVID-19 Vaccine (3 - Booster for Moderna series) 02/13/2022*   Tetanus Vaccine  09/16/2026   Colon Cancer Screening  02/04/2027   Pneumonia Vaccine  Completed   Flu Shot  Completed   DEXA scan (bone density measurement)  Completed   Hepatitis C Screening: USPSTF Recommendation to screen - Ages 102-79 yo.  Completed   HPV Vaccine  Aged Out  *Topic was postponed. The date shown is not the original due date.    Health Maintenance, Female Adopting a healthy lifestyle and getting preventive care are important in promoting health and wellness. Ask your health care provider about: The right schedule for you to have regular tests and exams. Things you can do on your own to prevent diseases and keep yourself healthy. What should I know about diet, weight, and exercise? Eat a healthy diet  Eat a diet that includes plenty of vegetables, fruits, low-fat dairy products, and lean protein. Do not eat a lot of foods that are high in solid fats, added sugars, or sodium. Maintain a healthy weight Body mass index (BMI) is used to identify weight problems. It estimates body fat based on height and weight. Your health care provider can help determine your BMI and help you achieve or maintain a healthy weight. Get regular exercise Get regular exercise. This is one of the most important things you can do for your health. Most adults should: Exercise for at least 150 minutes each week. The exercise should increase your heart rate and make you  sweat (moderate-intensity exercise). Do strengthening exercises at least twice a week. This is in addition to the moderate-intensity exercise. Spend less time sitting. Even light physical activity can be beneficial. Watch cholesterol and blood lipids Have your blood tested for lipids and cholesterol at 75 years of age, then have this test every 5 years. Have your cholesterol levels checked more often if: Your lipid or cholesterol levels are high. You are older than 75 years of age. You are at high risk for heart disease. What should I know about cancer screening? Depending on your health history and family history, you may need to have cancer screening at various ages. This may include screening for: Breast cancer. Cervical cancer. Colorectal cancer. Skin cancer. Lung cancer. What should I know about heart disease, diabetes, and high blood pressure? Blood pressure and heart disease High blood pressure causes heart disease and increases the risk of stroke. This is more likely to develop in people who have high blood pressure readings or are overweight. Have your blood pressure checked: Every 3-5 years if you are 94-65 years of age. Every year if you are 70 years old or older. Diabetes Have regular diabetes screenings. This checks your fasting blood sugar level. Have the screening done: Once every three years after age 46 if you are at a normal weight and have a low risk for diabetes. More often and at a younger age if you are overweight  or have a high risk for diabetes. What should I know about preventing infection? Hepatitis B If you have a higher risk for hepatitis B, you should be screened for this virus. Talk with your health care provider to find out if you are at risk for hepatitis B infection. Hepatitis C Testing is recommended for: Everyone born from [birth year] through 1964. Anyone with known risk factors for hepatitis C. Sexually transmitted infections (STIs) Get screened for  STIs, including gonorrhea and chlamydia, if: You are sexually active and are younger than 75 years of age. You are older than 75 years of age and your health care provider tells you that you are at risk for this type of infection. Your sexual activity has changed since you were last screened, and you are at increased risk for chlamydia or gonorrhea. Ask your health care provider if you are at risk. Ask your health care provider about whether you are at high risk for HIV. Your health care provider may recommend a prescription medicine to help prevent HIV infection. If you choose to take medicine to prevent HIV, you should first get tested for HIV. You should then be tested every 3 months for as long as you are taking the medicine. Pregnancy If you are about to stop having your period (premenopausal) and you may become pregnant, seek counseling before you get pregnant. Take 400 to 800 micrograms (mcg) of folic acid every day if you become pregnant. Ask for birth control (contraception) if you want to prevent pregnancy. Osteoporosis and menopause Osteoporosis is a disease in which the bones lose minerals and strength with aging. This can result in bone fractures. If you are 53 years old or older, or if you are at risk for osteoporosis and fractures, ask your health care provider if you should: Be screened for bone loss. Take a calcium or vitamin D supplement to lower your risk of fractures. Be given hormone replacement therapy (HRT) to treat symptoms of menopause. Follow these instructions at home: Alcohol use Do not drink alcohol if: Your health care provider tells you not to drink. You are pregnant, may be pregnant, or are planning to become pregnant. If you drink alcohol: Limit how much you have to: 0-1 drink a day. Know how much alcohol is in your drink. In the [place] one drink equals one 12 oz bottle of beer (355 mL), one 5 oz glass of wine (148 mL), or one 1 oz glass of hard liquor (44  mL). Lifestyle Do not use any products that contain nicotine or tobacco. These products include cigarettes, chewing tobacco, and vaping devices, such as e-cigarettes. If you need help quitting, ask your health care provider. Do not use street drugs. Do not share needles. Ask your health care provider for help if you need support or information about quitting drugs. General instructions Schedule regular health, dental, and eye exams. Stay current with your vaccines. Tell your health care provider if: You often feel depressed. You have ever been abused or do not feel safe at home. Summary Adopting a healthy lifestyle and getting preventive care are important in promoting health and wellness. Follow your health care provider's instructions about healthy diet, exercising, and getting tested or screened for diseases. Follow your health care provider's instructions on monitoring your cholesterol and blood pressure. This information is not intended to replace advice given to you by your health care provider. Make sure you discuss any questions you have with your health care provider. Document Revised: 07/27/2020 Document Reviewed:  07/27/2020 Elsevier Patient Education  2022 [employer].

## 2021-04-26 DIAGNOSIS — E782 Mixed hyperlipidemia: Secondary | ICD-10-CM | POA: Diagnosis not present

## 2021-04-26 DIAGNOSIS — I10 Essential (primary) hypertension: Secondary | ICD-10-CM | POA: Diagnosis not present

## 2021-04-27 LAB — LIPID PANEL
Chol/HDL Ratio: 3.2 ratio (ref 0.0–4.4)
Cholesterol, Total: 147 mg/dL (ref 100–199)
HDL: 46 mg/dL (ref >39)
LDL Chol Calc (NIH): 83 mg/dL (ref 0–99)
Triglycerides: 98 mg/dL (ref 0–149)
VLDL Cholesterol Cal: 18 mg/dL (ref 5–40)

## 2021-04-27 LAB — CMP14+EGFR
ALT: 15 IU/L (ref 0–32)
AST: 20 IU/L (ref 0–40)
Albumin/Globulin Ratio: 1.4 (ref 1.2–2.2)
Albumin: 3.9 g/dL (ref 3.7–4.7)
Alkaline Phosphatase: 65 IU/L (ref 44–121)
BUN/Creatinine Ratio: 13 (ref 12–28)
BUN: 13 mg/dL (ref 8–27)
Bilirubin Total: 0.8 mg/dL (ref 0.0–1.2)
CO2: 28 mmol/L (ref 20–29)
Calcium: 9.4 mg/dL (ref 8.7–10.3)
Chloride: 91 mmol/L — ABNORMAL LOW (ref 96–106)
Creatinine, Ser: 1 mg/dL (ref 0.57–1.00)
Globulin, Total: 2.8 g/dL (ref 1.5–4.5)
Glucose: 116 mg/dL — ABNORMAL HIGH (ref 70–99)
Potassium: 4.8 mmol/L (ref 3.5–5.2)
Sodium: 136 mmol/L (ref 134–144)
Total Protein: 6.7 g/dL (ref 6.0–8.5)
eGFR: 59 mL/min/{1.73_m2} — ABNORMAL LOW (ref >59)

## 2021-04-30 ENCOUNTER — Ambulatory Visit (INDEPENDENT_AMBULATORY_CARE_PROVIDER_SITE_OTHER): Payer: Medicare HMO | Admitting: Nurse Practitioner

## 2021-04-30 ENCOUNTER — Encounter: Payer: Self-pay | Admitting: Nurse Practitioner

## 2021-04-30 ENCOUNTER — Encounter (INDEPENDENT_AMBULATORY_CARE_PROVIDER_SITE_OTHER): Payer: Self-pay

## 2021-04-30 ENCOUNTER — Other Ambulatory Visit: Payer: Self-pay

## 2021-04-30 VITALS — BP 132/60 | HR 94 | Ht 67.0 in | Wt 160.0 lb

## 2021-04-30 DIAGNOSIS — Z139 Encounter for screening, unspecified: Secondary | ICD-10-CM

## 2021-04-30 DIAGNOSIS — I10 Essential (primary) hypertension: Secondary | ICD-10-CM

## 2021-04-30 DIAGNOSIS — E782 Mixed hyperlipidemia: Secondary | ICD-10-CM

## 2021-04-30 DIAGNOSIS — R600 Localized edema: Secondary | ICD-10-CM | POA: Diagnosis not present

## 2021-04-30 DIAGNOSIS — M069 Rheumatoid arthritis, unspecified: Secondary | ICD-10-CM | POA: Diagnosis not present

## 2021-04-30 DIAGNOSIS — J441 Chronic obstructive pulmonary disease with (acute) exacerbation: Secondary | ICD-10-CM | POA: Diagnosis not present

## 2021-04-30 DIAGNOSIS — E559 Vitamin D deficiency, unspecified: Secondary | ICD-10-CM

## 2021-04-30 DIAGNOSIS — Z720 Tobacco use: Secondary | ICD-10-CM

## 2021-04-30 MED ORDER — BUDESONIDE-FORMOTEROL FUMARATE 80-4.5 MCG/ACT IN AERO: 2.0000 | INHALATION_SPRAY | Freq: Two times a day (BID) | RESPIRATORY_TRACT | 3 refills | Status: DC

## 2021-04-30 MED ORDER — LOSARTAN POTASSIUM 50 MG PO TABS: 50.0000 mg | ORAL_TABLET | Freq: Every day | ORAL | 0 refills | Status: DC

## 2021-04-30 MED ORDER — METOPROLOL SUCCINATE ER 25 MG PO TB24: 25.0000 mg | ORAL_TABLET | Freq: Every day | ORAL | 3 refills | Status: AC

## 2021-04-30 NOTE — Assessment & Plan Note (Signed)
Well controlled, continue Crestor 10mg  daily

## 2021-04-30 NOTE — Progress Notes (Signed)
° °  Leslie [name]     MRN: [id number]      DOB: 28-Aug-1945   HPI Leslie [name] is here for follow up and re-evaluation of chronic medical conditions, medication management and review of any available recent lab and radiology data.  Preventive health is updated, specifically  Cancer screening and Immunization.   Questions or concerns regarding consultations or procedures which the PT has had in the interim are  addressed. The PT denies any adverse reactions to current medications since the last visit.  There are no new concerns.  There are no specific complaints   Pt c/o of ankle and feet swelling for months, right foot is worse. Pt denies pain , numbness. She has taking lasix 20mg  daily to help her swelling.   She has been using albuterol inhaler daily, continues to smoke,  she has been smoking since about age 46, its been hard for her to quit. Smokes half a pack a day. She has tried nicotine patch, chantix makes her see wired things . She has tried the 800 number but they will not pick up .   ROS Denies recent fever or chills. Denies sinus pressure, nasal congestion, ear pain or sore throat. Denies chest congestion, has productive cough and  wheezing. Denies chest pains, palpitations, has  leg swelling Denies abdominal pain, nausea, vomiting,diarrhea or constipation.   Denies dysuria, frequency, hesitancy or incontinence. Denies joint pain, swelling and limitation in mobility. Denies headaches, seizures, numbness, or tingling. Denies depression, anxiety or insomnia. Denies skin break down or rash.   PE  BP 132/60 (BP Location: Right Arm, Patient Position: Sitting, Cuff Size: Normal)    Pulse 94    Ht 5\' 7"  (1.702 m)    Wt 160 lb (72.6 kg)    SpO2 (!) 86%    BMI 25.06 kg/m   Patient alert and oriented and in no cardiopulmonary distress.  Chest: Clear to auscultation bilaterally.  CVS: S1, S2 no murmurs, no S3.Regular rate.  ABD: Soft non tender.   TXM:IWOEHOZYY lower extremities +1  pitting edema   MS: Adequate ROM spine, shoulders, hips and knees.  Skin: Intact, no ulcerations or rash noted.  Psych: Good eye contact, normal affect. Memory intact not anxious or depressed appearing.  CNS: CN 2-12 intact, power,  normal throughout.no focal deficits noted.   Assessment & Plan

## 2021-04-30 NOTE — Assessment & Plan Note (Signed)
Smoking cessation education completed , not willing to quit at this time.

## 2021-04-30 NOTE — Assessment & Plan Note (Addendum)
°  Take lasix 20mg  three times weekly as needed for edema. Pt told to take potassium when taking lasix.  wear compression socks, keep feet elevated when sitting.

## 2021-04-30 NOTE — Assessment & Plan Note (Signed)
DASH diet and commitment to daily physical activity for a minimum of 30 minutes discussed and encouraged, as a part of hypertension management. The importance of attaining a healthy weight is also discussed.  BP/Weight 04/30/2021 01/28/2021 01/11/2021 12/15/2020 09/04/2020 07/29/2020 06/30/9045  Systolic BP 533 917 921 783 754 237 023  Diastolic BP 60 81 80 68 72 92 98  Wt. (Lbs) 160 159 158 158 149.8 156 164.2  BMI 25.06 24.9 24.75 24.75 23.46 24.43 25.72  continue losartan 50mg  daily, metoprolol 25 mg daily.

## 2021-04-30 NOTE — Patient Instructions (Addendum)
Please get your covid booster and shingles vaccine at your pharmacy. Please use Symbicort twice daily for your COPD.   It is important that you exercise regularly at least 30 minutes 5 times a week.  Think about what you will eat, plan ahead. Choose " clean, green, fresh or frozen" over canned, processed or packaged foods which are more sugary, salty and fatty. 70 to 75% of food eaten should be vegetables and fruit. Three meals at set times with snacks allowed between meals, but they must be fruit or vegetables. Aim to eat over a 12 hour period , example 7 am to 7 pm, and STOP after  your last meal of the day. Drink water,generally about 64 ounces per day, no other drink is as healthy. Fruit juice is best enjoyed in a healthy way, by EATING the fruit.  Thanks for choosing [facility], we consider it a privelige to serve you.

## 2021-04-30 NOTE — Assessment & Plan Note (Addendum)
RX symbicort BID, 87% oxygen saturation on room air. No sign of respiratory distress noted, lung sounds clear  Continue albuterol PRN Refer to pulmonology.  Smoking cessation education completed

## 2021-05-03 ENCOUNTER — Telehealth: Payer: Self-pay | Admitting: Pulmonary Disease

## 2021-05-03 NOTE — Telephone Encounter (Signed)
Happy to see at least once to see if I can help as long as no OSA issue   Must bring all active meds with her to 1st ov

## 2021-05-03 NOTE — Telephone Encounter (Signed)
Received referral from Vena Rua with [facility], but pt established with Dr. Halford Chessman.  Pt wanted to change providers.  Made her aware that once we got the OK from Dr. and Dr. Elsworth Soho or Dr. Melvyn Novas, will contact her to schedule.  Please advise.   Please advise

## 2021-05-03 NOTE — Telephone Encounter (Signed)
Ok with me to have her switch.

## 2021-05-10 NOTE — Telephone Encounter (Signed)
Pt scheduled with Dr. Melvyn Novas.

## 2021-06-03 ENCOUNTER — Ambulatory Visit: Payer: Medicare HMO | Admitting: Internal Medicine

## 2021-06-03 NOTE — Progress Notes (Incomplete)
? ?  Leslie [name], female    DOB: Nov 02, 1945     MRN: [id number] ? ? ?Brief patient profile:  ?***  yo*** *** referred to pulmonary clinic in [place]  06/03/2021 by *** for ***  ? ? ? ? ?History of Present Illness  ?06/03/2021  Pulmonary/ 1st office eval/ Melvyn Novas / [facility]  ?No chief complaint on file. ?  ? ?Dyspnea:  *** ?Cough: *** ?Sleep: *** ?SABA use:  ? ?Past Medical History:  ?Diagnosis Date  ? Back pain   ? DDD  ? Carotid artery occlusion   ? Cough   ? Diastolic heart failure ([place])   ? Dry skin   ? Essential hypertension   ? History of bruising easily   ? History of colon polyps   ? History of migraine   ? History of pneumonia   ? Hyperlipidemia   ? PAD (peripheral artery disease) ([place])   ? Panic attack   ? Rheumatoid arthritis(714.0)   ? ? ?Outpatient Medications Prior to Visit  ?Medication Sig Dispense Refill  ? albuterol (PROVENTIL) (2.5 MG/3ML) 0.083% nebulizer solution Take 3 mLs (2.5 mg total) by nebulization every 6 (six) hours as needed for wheezing or shortness of breath. 75 mL 12  ? albuterol (VENTOLIN HFA) 108 (90 Base) MCG/ACT inhaler INHALE 2 INHALATIONS INTO THE LUNGS EVERY 6 HOURS AS NEEDED FOR WHEEZING (Patient taking differently: Inhale 2 puffs into the lungs every 6 (six) hours as needed for wheezing.) 54 g 6  ? aspirin EC 81 MG tablet Take 81 mg by mouth daily.    ? budesonide-formoterol (SYMBICORT) 80-4.5 MCG/ACT inhaler Inhale 2 puffs into the lungs 2 (two) times daily. 1 each 3  ? cilostazol (PLETAL) 100 MG tablet Take 1 tablet (100 mg total) by mouth 2 (two) times daily before a meal. 60 tablet 11  ? furosemide (LASIX) 20 MG tablet Take 1 tablet (20 mg total) by mouth daily as needed. 90 tablet 3  ? ibuprofen (IBU) 800 MG tablet TAKE 1 TABLET EVERY DAY AS NEEDED FOR MODERATE PAIN. 60 tablet 0  ? losartan (COZAAR) 50 MG tablet Take 1 tablet (50 mg total) by mouth daily. 180 tablet 0  ? metoprolol succinate (TOPROL-XL) 25 MG 24 hr tablet Take 1 tablet (25 mg total) by mouth daily. 90  tablet 3  ? pantoprazole (PROTONIX) 40 MG tablet Take 1 tablet (40 mg total) by mouth daily. 90 tablet 0  ? potassium chloride SA (KLOR-CON) 20 MEQ tablet Take 1 tablet (20 mEq total) by mouth daily as needed. (Patient not taking: Reported on 04/30/2021) 90 tablet 3  ? rosuvastatin (CRESTOR) 10 MG tablet Take 1 tablet (10 mg total) by mouth daily. 90 tablet 0  ? ?No facility-administered medications prior to visit.  ? ? ? ?Objective:  ?  ? ?There were no vitals taken for this visit. ? ?  ? ? ?   ?Assessment  ? ?No problem-specific Assessment & Plan notes found for this encounter. ? ? ? ? ?Christinia Gully, MD ?06/03/2021 ?   ?

## 2021-06-15 ENCOUNTER — Other Ambulatory Visit: Payer: Self-pay | Admitting: Physician Assistant

## 2021-06-15 DIAGNOSIS — E785 Hyperlipidemia, unspecified: Secondary | ICD-10-CM

## 2021-06-21 ENCOUNTER — Ambulatory Visit: Payer: Medicare HMO | Admitting: Internal Medicine

## 2021-06-23 ENCOUNTER — Other Ambulatory Visit: Payer: Self-pay | Admitting: Nurse Practitioner

## 2021-06-23 DIAGNOSIS — M545 Low back pain, unspecified: Secondary | ICD-10-CM

## 2021-06-24 ENCOUNTER — Other Ambulatory Visit: Payer: Self-pay | Admitting: Nurse Practitioner

## 2021-06-24 DIAGNOSIS — M545 Low back pain, unspecified: Secondary | ICD-10-CM

## 2021-06-24 MED ORDER — IBUPROFEN 800 MG PO TABS: ORAL_TABLET | ORAL | 0 refills | Status: DC

## 2021-07-02 ENCOUNTER — Ambulatory Visit: Payer: Medicare HMO | Admitting: Internal Medicine

## 2021-07-02 NOTE — Progress Notes (Deleted)
? ?  Leslie [name], female    DOB: [birth year]     MRN: [id number] ? ? ?Brief patient profile:  ?***  yo*** *** referred to pulmonary clinic in [place]  07/02/2021 by *** for ***  ? ? ? ? ?History of Present Illness  ?07/02/2021  Pulmonary/ 1st office eval/ Melvyn Novas / [facility]  ?No chief complaint on file. ?  ? ?Dyspnea:  *** ?Cough: *** ?Sleep: *** ?SABA use:  ? ?Past Medical History:  ?Diagnosis Date  ? Back pain   ? DDD  ? Carotid artery occlusion   ? Cough   ? Diastolic heart failure ([place])   ? Dry skin   ? Essential hypertension   ? History of bruising easily   ? History of colon polyps   ? History of migraine   ? History of pneumonia   ? Hyperlipidemia   ? PAD (peripheral artery disease) ([place])   ? Panic attack   ? Rheumatoid arthritis(714.0)   ? ? ?Outpatient Medications Prior to Visit  ?Medication Sig Dispense Refill  ? albuterol (PROVENTIL) (2.5 MG/3ML) 0.083% nebulizer solution Take 3 mLs (2.5 mg total) by nebulization every 6 (six) hours as needed for wheezing or shortness of breath. 75 mL 12  ? albuterol (VENTOLIN HFA) 108 (90 Base) MCG/ACT inhaler INHALE 2 INHALATIONS INTO THE LUNGS EVERY 6 HOURS AS NEEDED FOR WHEEZING (Patient taking differently: Inhale 2 puffs into the lungs every 6 (six) hours as needed for wheezing.) 54 g 6  ? aspirin EC 81 MG tablet Take 81 mg by mouth daily.    ? budesonide-formoterol (SYMBICORT) 80-4.5 MCG/ACT inhaler Inhale 2 puffs into the lungs 2 (two) times daily. 1 each 3  ? cilostazol (PLETAL) 100 MG tablet Take 1 tablet (100 mg total) by mouth 2 (two) times daily before a meal. 60 tablet 11  ? furosemide (LASIX) 20 MG tablet Take 1 tablet (20 mg total) by mouth daily as needed. 90 tablet 3  ? ibuprofen (IBU) 800 MG tablet TAKE 1 TABLET EVERY DAY AS NEEDED FOR MODERATE PAIN. 30 tablet 0  ? losartan (COZAAR) 50 MG tablet Take 1 tablet (50 mg total) by mouth daily. 180 tablet 0  ? metoprolol succinate (TOPROL-XL) 25 MG 24 hr tablet Take 1 tablet (25 mg total) by mouth daily. 90  tablet 3  ? pantoprazole (PROTONIX) 40 MG tablet Take 1 tablet (40 mg total) by mouth daily. 90 tablet 0  ? potassium chloride SA (KLOR-CON) 20 MEQ tablet Take 1 tablet (20 mEq total) by mouth daily as needed. (Patient not taking: Reported on 04/30/2021) 90 tablet 3  ? rosuvastatin (CRESTOR) 10 MG tablet TAKE 1 TABLET BY MOUTH ONCE A DAY. 90 tablet 2  ? ?No facility-administered medications prior to visit.  ? ? ? ?Objective:  ?  ? ?There were no vitals taken for this visit. ? ?  ? ? ?   ?Assessment  ? ?No problem-specific Assessment & Plan notes found for this encounter. ? ? ? ? ?Christinia Gully, MD ?07/02/2021 ?   ?

## 2021-08-20 DIAGNOSIS — E559 Vitamin D deficiency, unspecified: Secondary | ICD-10-CM | POA: Diagnosis not present

## 2021-08-20 DIAGNOSIS — E119 Type 2 diabetes mellitus without complications: Secondary | ICD-10-CM | POA: Diagnosis not present

## 2021-08-20 DIAGNOSIS — E785 Hyperlipidemia, unspecified: Secondary | ICD-10-CM | POA: Diagnosis not present

## 2021-08-20 DIAGNOSIS — I10 Essential (primary) hypertension: Secondary | ICD-10-CM | POA: Diagnosis not present

## 2021-08-20 DIAGNOSIS — Z139 Encounter for screening, unspecified: Secondary | ICD-10-CM | POA: Diagnosis not present

## 2021-08-20 DIAGNOSIS — E782 Mixed hyperlipidemia: Secondary | ICD-10-CM | POA: Diagnosis not present

## 2021-08-21 LAB — CMP14+EGFR
ALT: 20 IU/L (ref 0–32)
AST: 25 IU/L (ref 0–40)
Albumin/Globulin Ratio: 1.3 (ref 1.2–2.2)
Albumin: 4 g/dL (ref 3.7–4.7)
Alkaline Phosphatase: 82 IU/L (ref 44–121)
BUN/Creatinine Ratio: 9 — ABNORMAL LOW (ref 12–28)
BUN: 8 mg/dL (ref 8–27)
Bilirubin Total: 0.8 mg/dL (ref 0.0–1.2)
CO2: 25 mmol/L (ref 20–29)
Calcium: 9.8 mg/dL (ref 8.7–10.3)
Chloride: 101 mmol/L (ref 96–106)
Creatinine, Ser: 0.92 mg/dL (ref 0.57–1.00)
Globulin, Total: 3 g/dL (ref 1.5–4.5)
Glucose: 86 mg/dL (ref 70–99)
Potassium: 4.8 mmol/L (ref 3.5–5.2)
Sodium: 139 mmol/L (ref 134–144)
Total Protein: 7 g/dL (ref 6.0–8.5)
eGFR: 65 mL/min/1.73

## 2021-08-21 LAB — LIPID PANEL
Chol/HDL Ratio: 2.7 ratio (ref 0.0–4.4)
Cholesterol, Total: 145 mg/dL (ref 100–199)
HDL: 54 mg/dL
LDL Chol Calc (NIH): 77 mg/dL (ref 0–99)
Triglycerides: 71 mg/dL (ref 0–149)
VLDL Cholesterol Cal: 14 mg/dL (ref 5–40)

## 2021-08-21 LAB — CBC WITH DIFFERENTIAL/PLATELET
Basophils Absolute: 0 10*3/uL (ref 0.0–0.2)
Basos: 1 %
EOS (ABSOLUTE): 0.1 10*3/uL (ref 0.0–0.4)
Eos: 2 %
Hematocrit: 43.1 % (ref 34.0–46.6)
Hemoglobin: 14 g/dL (ref 11.1–15.9)
Immature Grans (Abs): 0 10*3/uL (ref 0.0–0.1)
Immature Granulocytes: 0 %
Lymphocytes Absolute: 1.6 10*3/uL (ref 0.7–3.1)
Lymphs: 39 %
MCH: 27 pg (ref 26.6–33.0)
MCHC: 32.5 g/dL (ref 31.5–35.7)
MCV: 83 fL (ref 79–97)
Monocytes Absolute: 0.5 10*3/uL (ref 0.1–0.9)
Monocytes: 12 %
Neutrophils Absolute: 1.9 10*3/uL (ref 1.4–7.0)
Neutrophils: 46 %
Platelets: 242 10*3/uL (ref 150–450)
RBC: 5.19 x10E6/uL (ref 3.77–5.28)
RDW: 15.4 % (ref 11.7–15.4)
WBC: 4.2 10*3/uL (ref 3.4–10.8)

## 2021-08-21 LAB — HEMOGLOBIN A1C
Est. average glucose Bld gHb Est-mCnc: 140 mg/dL
Hgb A1c MFr Bld: 6.5 % — ABNORMAL HIGH (ref 4.8–5.6)

## 2021-08-21 LAB — VITAMIN D 25 HYDROXY (VIT D DEFICIENCY, FRACTURES): Vit D, 25-Hydroxy: 24.9 ng/mL — ABNORMAL LOW (ref 30.0–100.0)

## 2021-08-21 LAB — TSH: TSH: 0.874 u[IU]/mL (ref 0.450–4.500)

## 2021-08-23 NOTE — Progress Notes (Signed)
Pt has upcoming appointment with me , will review labs then.

## 2021-08-24 ENCOUNTER — Ambulatory Visit (INDEPENDENT_AMBULATORY_CARE_PROVIDER_SITE_OTHER): Payer: Medicare HMO | Admitting: Nurse Practitioner

## 2021-08-24 ENCOUNTER — Encounter: Payer: Self-pay | Admitting: Nurse Practitioner

## 2021-08-24 VITALS — BP 97/64 | HR 87 | Ht 67.0 in | Wt 151.0 lb

## 2021-08-24 DIAGNOSIS — I110 Hypertensive heart disease with heart failure: Secondary | ICD-10-CM | POA: Diagnosis not present

## 2021-08-24 DIAGNOSIS — E782 Mixed hyperlipidemia: Secondary | ICD-10-CM

## 2021-08-24 DIAGNOSIS — E1169 Type 2 diabetes mellitus with other specified complication: Secondary | ICD-10-CM

## 2021-08-24 DIAGNOSIS — E559 Vitamin D deficiency, unspecified: Secondary | ICD-10-CM | POA: Insufficient documentation

## 2021-08-24 DIAGNOSIS — I10 Essential (primary) hypertension: Secondary | ICD-10-CM

## 2021-08-24 DIAGNOSIS — J449 Chronic obstructive pulmonary disease, unspecified: Secondary | ICD-10-CM

## 2021-08-24 DIAGNOSIS — E119 Type 2 diabetes mellitus without complications: Secondary | ICD-10-CM | POA: Diagnosis not present

## 2021-08-24 DIAGNOSIS — I509 Heart failure, unspecified: Secondary | ICD-10-CM | POA: Diagnosis not present

## 2021-08-24 DIAGNOSIS — Z720 Tobacco use: Secondary | ICD-10-CM

## 2021-08-24 MED ORDER — VITAMIN D3 25 MCG (1000 UT) PO CAPS: 1000.0000 [IU] | ORAL_CAPSULE | Freq: Every day | ORAL | 3 refills | Status: DC

## 2021-08-24 NOTE — Assessment & Plan Note (Addendum)
On albuterol inhaler as needed oxygen saturation 89% on room air, no sign of respiratory distress noted States that she has seen pulmonology once Continues to smoke daily, smoking cessation education completed. Unable to afford cost of Symbicort patient referred to [facility]CM for medication assistance

## 2021-08-24 NOTE — Assessment & Plan Note (Signed)
Smokes about half pack/day  Asked about quitting: confirms that he/she currently smokes cigarettes Advise to quit smoking: Educated about QUITTING to reduce the risk of cancer, cardio and cerebrovascular disease. Assess willingness: Unwilling to quit at this time, but is working on cutting back. Assist with counseling and pharmacotherapy: Counseled for 5 minutes and literature provided. Arrange for follow up: follow up in 4 months and continue to offer help.

## 2021-08-24 NOTE — Patient Instructions (Addendum)
Please continue to monitor your blood pressure daily at home , if your systolic blood pressure stays  below 100, please call the office.    Please start taking vitamin D 1000 UNITS DAILY    It is important that you exercise regularly at least 30 minutes 5 times a week.  Think about what you will eat, plan ahead. Choose " clean, green, fresh or frozen" over canned, processed or packaged foods which are more sugary, salty and fatty. 70 to 75% of food eaten should be vegetables and fruit. Three meals at set times with snacks allowed between meals, but they must be fruit or vegetables. Aim to eat over a 12 hour period , example 7 am to 7 pm, and STOP after  your last meal of the day. Drink water,generally about 64 ounces per day, no other drink is as healthy. Fruit juice is best enjoyed in a healthy way, by EATING the fruit.  Thanks for choosing [facility], we consider it a privelige to serve you.

## 2021-08-24 NOTE — Assessment & Plan Note (Signed)
Last vitamin D Lab Results  Component Value Date   VD25OH 24.9 (L) 08/20/2021  Start vitamin D 1000 units daily.

## 2021-08-24 NOTE — Assessment & Plan Note (Signed)
BP Readings from Last 3 Encounters:  08/24/21 97/64  04/30/21 132/60  01/28/21 (!) 144/81  Currently on losartan 50 mg daily, metoprolol 25 mg daily Blood pressure is a little bit soft in the office today but patient states that her blood pressure at home has mostly been in the 120s 130s.  Patient told to monitor her blood pressure daily at home and report if she gets systolic blood pressure consistently less than 100  Continue current medications DASH diet advised engage in regular exercises at least for 50 minutes weekly as tolerated

## 2021-08-24 NOTE — Assessment & Plan Note (Signed)
Lab Results  Component Value Date   CHOL 145 08/20/2021   HDL 54 08/20/2021   LDLCALC 77 08/20/2021   TRIG 71 08/20/2021   CHOLHDL 2.7 08/20/2021  Currently on rosuvastatin 10 mg daily Continue current medications Avoid fatty fried foods

## 2021-08-24 NOTE — Assessment & Plan Note (Signed)
Lab Results  Component Value Date   HGBA1C 6.5 (H) 08/20/2021  New diagnosis of diabetes Avoid sugar sweets soda and engage in regular daily exercises at least 150 minutes weekly Recheck labs in 4 months

## 2021-08-24 NOTE — Progress Notes (Signed)
Leslie [name]     MRN: [id number]      DOB: 28-Dec-1945   HPI [name] with past medical history of hypertension, CHF, COPD, hyperlipidemia, tobacco abuse, vitamin D deficiency is here for follow up and re-evaluation of chronic medical conditions, medication management and review of any available recent lab    Hypertension.  Currently on losartan 50 mg daily, metoprolol 25 mg daily.  Reports that she has been taking medications as prescribed, patient reports that her blood pressure has been normal at home, occasionally has Sytolic BP less than 299.  Patient denies chest pain, dizziness, edema.   COPD .  Currently on albuterol inhaler every 6 hours as needed , states that she sometimes has wheezing , has not been using Symbicort due to cost of the medication .  Patient continues to smokes a little more than half pack of cigarettes daily  Due for shingles vaccine patient encouraged to get shingles vaccine at the pharmacy.  ROS Denies recent fever or chills. Denies sinus pressure, nasal congestion, ear pain or sore throat. Denies chest congestion, productive cough  Denies chest pains, palpitations and leg swelling Denies abdominal pain, nausea, vomiting,diarrhea or constipation.   Denies dysuria, frequency, hesitancy or incontinence. Denies joint pain, swelling and limitation in mobility. Denies headaches, seizures, numbness, or tingling. Denies depression, anxiety or insomnia.    PE  BP 97/64 (BP Location: Left Arm, Patient Position: Sitting, Cuff Size: Normal)   Pulse 87   Ht '5\' 7"'$  (1.702 m)   Wt 151 lb (68.5 kg)   SpO2 (!) 89%   BMI 23.65 kg/m   Patient alert and oriented and in no cardiopulmonary distress.  Chest: Clear to auscultation bilaterally.  CVS: S1, S2  murmurs, no S3.Regular rate.  ABD: Soft non tender.   Ext: No edema  MS: Adequate ROM spine, shoulders, hips and knees.   Psych: Good eye contact, normal affect. Memory intact not anxious or depressed  appearing.  CNS: CN 2-12 intact, power,  normal throughout.no focal deficits noted.   Assessment & Plan  Tobacco abuse Smokes about half pack/day  Asked about quitting: confirms that he/she currently smokes cigarettes Advise to quit smoking: Educated about QUITTING to reduce the risk of cancer, cardio and cerebrovascular disease. Assess willingness: Unwilling to quit at this time, but is working on cutting back. Assist with counseling and pharmacotherapy: Counseled for 5 minutes and literature provided. Arrange for follow up: follow up in 4 months and continue to offer help.  Hypertension BP Readings from Last 3 Encounters:  08/24/21 97/64  04/30/21 132/60  01/28/21 (!) 144/81  Currently on losartan 50 mg daily, metoprolol 25 mg daily Blood pressure is a little bit soft in the office today but patient states that her blood pressure at home has mostly been in the 120s 130s.  Patient told to monitor her blood pressure daily at home and report if she gets systolic blood pressure consistently less than 100  Continue current medications DASH diet advised engage in regular exercises at least for 50 minutes weekly as tolerated  HLD (hyperlipidemia) Lab Results  Component Value Date   CHOL 145 08/20/2021   HDL 54 08/20/2021   LDLCALC 77 08/20/2021   TRIG 71 08/20/2021   CHOLHDL 2.7 08/20/2021  Currently on rosuvastatin 10 mg daily Continue current medications Avoid fatty fried foods  COPD (chronic obstructive pulmonary disease) (H[facility]) On albuterol inhaler as needed oxygen saturation 89% on room air, no sign of respiratory distress noted States  that she has seen pulmonology once Continues to smoke daily, smoking cessation education completed. Unable to afford cost of Symbicort patient referred to CCM for medication assistance  Diabetes mellitus ([place]) Lab Results  Component Value Date   HGBA1C 6.5 (H) 08/20/2021  New diagnosis of diabetes Avoid sugar sweets soda and engage in  regular daily exercises at least 150 minutes weekly Recheck labs in 4 months  Vitamin D deficiency Last vitamin D Lab Results  Component Value Date   VD25OH 24.9 (L) 08/20/2021  Start vitamin D 1000 units daily.

## 2021-09-02 ENCOUNTER — Telehealth: Payer: Self-pay

## 2021-09-02 ENCOUNTER — Other Ambulatory Visit: Payer: Self-pay

## 2021-09-02 DIAGNOSIS — J441 Chronic obstructive pulmonary disease with (acute) exacerbation: Secondary | ICD-10-CM

## 2021-09-02 DIAGNOSIS — Z596 Low income: Secondary | ICD-10-CM

## 2021-09-02 MED ORDER — BUDESONIDE-FORMOTEROL FUMARATE 80-4.5 MCG/ACT IN AERO: 2.0000 | INHALATION_SPRAY | Freq: Two times a day (BID) | RESPIRATORY_TRACT | 3 refills | Status: DC

## 2021-09-02 NOTE — Progress Notes (Signed)
[facility])  [place] Team    09/02/2021  Leslie [name] 09-16-1945 [id number]  Reason for referral: Medication Assistance with Symbicort  Referral source: [facility] RN Current insurance: [employer]  Brief Note:  [place] received a referral for medication assistance with Symbicort from [facility]. Per chart review and discussion with patient, she has not been taking Symbicort d/t affordability issues. Symbicort cost $55 at [employer] and she confirmed that she never took the medication.   Patient qualifies for [employer]'s COPD program. I called [employer] with [name] on the phone to get her enrolled. Pharmacy technician with [facility] informed patient that she would have a $0 copay if Symbicort was billed through their mail order pharmacy and [name] agreed. She is now partially enrolled in the COPD program but has to take medication for 1 month to answer efficacy related questions since she has never had the medication. Therefore, she was granted temporary authorization for Symbicort refills until her appointment at no cost.   Patient needs to call [employer] back 30 days after taking medication to given them an update on medication efficacy and adherence. Additionally, she needs to be compliant with scheduled appointment with [facility] pharmacist on 01/03/2022. Even if patient does not call [employer] back in 30 days she will need to be available for appointment on 01/03/2022 with [employer]'s pharmacist.    Will route note to Vena Rua, FNP.  Will close [facility] pharmacy case as no further medication needs identified at this time.  Am happy to assist in the future as needed.   Will follow up with patient prior to appointment in October.   Thank you for allowing pharmacy to be a part of this patient's care.  Kristeen Miss, PharmD Clinical Pharmacist [place] Cell: [phone]

## 2021-09-14 NOTE — Progress Notes (Signed)
Spoke with pt she verbalized understanding.

## 2021-11-01 ENCOUNTER — Other Ambulatory Visit: Payer: Self-pay | Admitting: Nurse Practitioner

## 2021-11-01 ENCOUNTER — Other Ambulatory Visit: Payer: Self-pay | Admitting: Pulmonary Disease

## 2021-11-01 DIAGNOSIS — I10 Essential (primary) hypertension: Secondary | ICD-10-CM

## 2021-11-01 DIAGNOSIS — M545 Low back pain, unspecified: Secondary | ICD-10-CM

## 2021-11-15 ENCOUNTER — Other Ambulatory Visit: Payer: Self-pay | Admitting: Nurse Practitioner

## 2021-11-15 DIAGNOSIS — J441 Chronic obstructive pulmonary disease with (acute) exacerbation: Secondary | ICD-10-CM

## 2021-11-17 ENCOUNTER — Other Ambulatory Visit: Payer: Self-pay

## 2021-11-17 ENCOUNTER — Telehealth: Payer: Self-pay

## 2021-11-17 MED ORDER — ALBUTEROL SULFATE HFA 108 (90 BASE) MCG/ACT IN AERS: 2.0000 | INHALATION_SPRAY | Freq: Four times a day (QID) | RESPIRATORY_TRACT | 6 refills | Status: AC | PRN

## 2021-11-17 NOTE — Telephone Encounter (Signed)
Yes

## 2021-11-17 NOTE — Telephone Encounter (Signed)
Please advise ok to fill?

## 2021-11-17 NOTE — Telephone Encounter (Signed)
Patient need med refill.   Not filled since Dr Anastasio Champion.   albuterol (VENTOLIN HFA) 108 (90 Base) MCG/ACT inhaler [[id number]]   Pharmacy: [facility]

## 2021-11-17 NOTE — Telephone Encounter (Signed)
Rx sent 

## 2021-12-23 ENCOUNTER — Ambulatory Visit
Admission: EM | Admit: 2021-12-23 | Discharge: 2021-12-23 | Disposition: A | Discharge status: Home / Self Care | Payer: Medicare HMO | Attending: Nurse Practitioner | Admitting: Nurse Practitioner

## 2021-12-23 DIAGNOSIS — L03213 Periorbital cellulitis: Secondary | ICD-10-CM | POA: Diagnosis not present

## 2021-12-23 MED ORDER — CLINDAMYCIN HCL 300 MG PO CAPS: 300.0000 mg | ORAL_CAPSULE | Freq: Three times a day (TID) | ORAL | 0 refills | Status: DC

## 2021-12-23 NOTE — ED Triage Notes (Signed)
Pt reports swelling and tenderness in right eye x 1 day.

## 2021-12-23 NOTE — ED Provider Notes (Signed)
[facility]    CSN: [id number] Arrival date & time: 12/23/21  1528      History   Chief Complaint Chief Complaint  Patient presents with   Eye Problem    HPI Leslie [name] is a 76 y.o. female.   Patient presents for right eye pain and swelling since yesterday.  She reports it hurts above her eye.  She denies foreign body sensation, use of contacts, visual impairment.  Reports her eyes a little bit red.  No significant discharge, crusting or matting of the eyelids, photophobia, headache, or floaters in her vision.  No excessive tearing.  No recent upper respiratory symptoms including no sore throat, fever, cough or congestion.  She denies any recent eye trauma or injury to the eye.  Has not tried nothing for symptoms so far.  Blood pressure is elevated today; she reports this happens often at doctors offices.  Reports she checks her blood pressure at home and it is in the 120-130 range.  She denies headache, chest pain, shortness of breath, lightheadedness or dizziness, lower extremity swelling.    Past Medical History:  Diagnosis Date   Back pain    DDD   Carotid artery occlusion    Cough    Diastolic heart failure (HCC)    Dry skin    Essential hypertension    History of bruising easily    History of colon polyps    History of migraine    History of pneumonia    Hyperlipidemia    PAD (peripheral artery disease) (HCC)    Panic attack    Rheumatoid arthritis(714.0)     Patient Active Problem List   Diagnosis Date Noted   Diabetes mellitus ([place]) 08/24/2021   Vitamin D deficiency 08/24/2021   Bilateral lower extremity edema 04/30/2021   Aortic atherosclerosis ([place]) 09/10/2020   Acute on chronic diastolic HF (heart failure) (HCC)    COPD (chronic obstructive pulmonary disease) ([place]) 03/08/2020   Acute CHF ([place]) 03/07/2020   Cough    Oropharyngeal dysphagia    Vitamin D deficiency disease 06/18/2019   CHF (congestive heart failure) ([place]) 03/19/2015    Hypertension 03/19/2015   Peripheral vascular disease ([place]) 03/19/2015   Congestive heart failure (CHF) ([place]) 03/19/2015   Aftercare following surgery of the circulatory system, NEC 05/14/2012   Carotid artery stenosis 03/12/2012   Non compliance w medication regimen secondary to cost 10/04/2011   Arthritis, rheumatoid ([place]) 10/04/2011   Dizziness 10/03/2011   PAD, high grade RICA by MRI 10/03/11 10/03/2011   Tobacco abuse 10/03/2011   LVH (left ventricular hypertrophy) 10/03/2011   Renal artery stenosis: left 50% 2005 10/03/2011   HLD (hyperlipidemia) 11/21/2007    Past Surgical History:  Procedure Laterality Date   ABDOMINAL HYSTERECTOMY     ANGIOPLASTY  04/20/2012   Procedure: ANGIOPLASTY;  Surgeon: Serafina Mitchell, MD;  Location: [facility];  Service: Vascular;  Laterality: Right;  Using 1cm x6 cm vascu-guard patch    [place]  01/31/2005   CAROTID ANGIOGRAM N/A 01/06/2012   Procedure: CAROTID ANGIOGRAM;  Surgeon: Lorretta Harp, MD;  Location: [facility] CATH LAB;  Service: Cardiovascular;  Laterality: N/A;   CARPAL TUNNEL RELEASE     CHOLECYSTECTOMY     COLONOSCOPY     CORONARY ANGIOPLASTY  01/06/12/2005/2006    2 stents   EGD/TCS  03/2004   Dr. Gala Romney: Tiny distal esophageal erosions, prepyloric antral erosions/ulcerations, internal hemorrhoids, diminutive polyp in the rectosigmoid  junction removed (hyperplastic).  Pathology not available but reportedly she had H. pylori which was treated.   ENDARTERECTOMY  04/20/2012   Procedure: ENDARTERECTOMY CAROTID;  Surgeon: Serafina Mitchell, MD;  Location: [facility];  Service: Vascular;  Laterality: Right;   ESOPHAGOGASTRODUODENOSCOPY  07/2004   Dr. Gala Romney: Previous antral ulcer was completely healed.  EGD normal.   GIVENS CAPSULE STUDY     ???   leg stents     LOWER EXTREMITY ANGIOGRAM N/A 01/06/2012   Procedure: LOWER EXTREMITY ANGIOGRAM;  Surgeon: Lorretta Harp, MD;  Location: [facility] C[facility] LAB;  Service:  Cardiovascular;  Laterality: N/A;   TOTAL KNEE ARTHROPLASTY     TRIGGER FINGER RELEASE     ulnar nerve surgery      OB History     Gravida      Para      Term      Preterm      AB      Living  2      SAB      IAB      Ectopic      Multiple      Live Births               Home Medications    Prior to Admission medications   Medication Sig Start Date End Date Taking? Authorizing Provider  clindamycin (CLEOCIN) 300 MG capsule Take 1 capsule (300 mg total) by mouth 3 (three) times daily for 7 days. 12/23/21 12/30/21 Yes Eulogio Bear, NP  albuterol (PROVENTIL) (2.5 MG/3ML) 0.083% nebulizer solution Take 3 mLs (2.5 mg total) by nebulization every 6 (six) hours as needed for wheezing or shortness of breath. 09/04/20   Chesley Mires, MD  albuterol (VENTOLIN HFA) 108 (90 Base) MCG/ACT inhaler Inhale 2 puffs into the lungs every 6 (six) hours as needed for wheezing or shortness of breath. INHALE 2 INHALATIONS INTO THE LUNGS EVERY 6 HOURS AS NEEDED FOR WHEEZING Strength: 108 (90 Base) MCG/ACT 11/17/21   Renee Rival, FNP  aspirin EC 81 MG tablet Take 81 mg by mouth daily.    [provider]  Cholecalciferol (VITAMIN D3) 25 MCG (1000 UT) CAPS Take 1 capsule (1,000 Units total) by mouth daily. 08/24/21   Renee Rival, FNP  cilostazol (PLETAL) 100 MG tablet Take 1 tablet (100 mg total) by mouth 2 (two) times daily before a meal. 01/11/21   Serafina Mitchell, MD  furosemide (LASIX) 20 MG tablet Take 1 tablet (20 mg total) by mouth daily as needed. 12/15/20 03/15/21  Imogene Burn, PA-C  ibuprofen (ADVIL) 800 MG tablet TAKE 1 TABLET EVERY DAY AS NEEDED FOR MODERATE PAIN. 11/02/21   Paseda, Dewaine Conger, FNP  losartan (COZAAR) 50 MG tablet TAKE (1) TABLET BY MOUTH IN THE MORNING AND AT BEDTIME 11/02/21   Paseda, Dewaine Conger, FNP  metoprolol succinate (TOPROL-XL) 25 MG 24 hr tablet Take 1 tablet (25 mg total) by mouth daily. 04/30/21   Paseda, Dewaine Conger, FNP   pantoprazole (PROTONIX) 40 MG tablet Take 1 tablet (40 mg total) by mouth daily. Patient not taking: Reported on 08/24/2021 01/28/21   Renee Rival, FNP  potassium chloride SA (KLOR-CON) 20 MEQ tablet Take 1 tablet (20 mEq total) by mouth daily as needed. Patient not taking: Reported on 04/30/2021 12/15/20   Imogene Burn, PA-C  rosuvastatin (CRESTOR) 10 MG tablet TAKE 1 TABLET BY MOUTH ONCE A DAY. 06/15/21   Imogene Burn, PA-C  SYMBICORT  80-4.5 MCG/ACT inhaler INHALE 2 PUFFS TWICE DAILY 11/15/21   Paseda, Dewaine Conger, FNP    Family History Family History  Problem Relation Age of Onset   Hyperlipidemia Mother    Hypertension Mother    Cancer Father        Brain   Diabetes Sister    Hypertension Sister    Diabetes Brother    Heart disease Brother    Hypertension Brother    Heart attack Brother    Colon cancer Neg Hx     Social History Social History   Tobacco Use   Smoking status: Every Day    Packs/day: 0.50    Years: 50.00    Total pack years: 25.00    Types: Cigarettes    Start date: 1961   Smokeless tobacco: Never   Tobacco comments:    Smoke 1/2 pack per day 09/04/20. Not willing to quit now.  Vaping Use   Vaping Use: Never used  Substance Use Topics   Alcohol use: Not Currently   Drug use: No     Allergies   Latex, Tape, and Atorvastatin   Review of Systems Review of Systems Per HPI  Physical Exam Triage Vital Signs ED Triage Vitals  Enc Vitals Group     BP 12/23/21 1533 (!) 187/73     Pulse Rate 12/23/21 1533 71     Resp 12/23/21 1533 20     Temp 12/23/21 1533 (!) 97.3 F (36.3 C)     Temp Source 12/23/21 1533 Temporal     SpO2 12/23/21 1533 (!) 89 %     Weight --      Height --      Head Circumference --      Peak Flow --      Pain Score 12/23/21 1536 7     Pain Loc --      Pain Edu? --      Excl. in [place]? --    No data found.  Updated Vital Signs BP (!) 187/73 (BP Location: Right Arm)   Pulse 71   Temp (!) 97.3 F (36.3 C)  (Temporal)   Resp 20   SpO2 (!) 89%   Visual Acuity Right Eye Distance: 60 Left Eye Distance: 30 Bilateral Distance: 40  Right Eye Near: R Near: 20 Left Eye Near:  L Near: 20 Bilateral Near:  20  Physical Exam Vitals and nursing note reviewed.  Constitutional:      General: She is not in acute distress.    Appearance: Normal appearance. She is not toxic-appearing.  HENT:     Head: Normocephalic and atraumatic.     Nose: Nose normal. No congestion or rhinorrhea.     Mouth/Throat:     Mouth: Mucous membranes are moist.     Pharynx: Oropharynx is clear.  Eyes:     General:        Right eye: No discharge.        Left eye: No discharge.     Extraocular Movements: Extraocular movements intact.     Right eye: Normal extraocular motion.     Left eye: Normal extraocular motion.     Conjunctiva/sclera:     Right eye: Right conjunctiva is not injected. No exudate.    Left eye: Left conjunctiva is not injected. No exudate.    Pupils: Pupils are equal, round, and reactive to light.      Comments: Swelling and edema to the right upper lid.  Pulmonary:  Effort: Pulmonary effort is normal. No respiratory distress.  Skin:    General: Skin is warm and dry.     Coloration: Skin is not jaundiced or pale.     Findings: No erythema.  Neurological:     Mental Status: She is alert and oriented to person, place, and time.  Psychiatric:        Behavior: Behavior is cooperative.      UC Treatments / Results  Labs (all labs ordered are listed, but only abnormal results are displayed) Labs Reviewed - No data to display  EKG   Radiology No results found.  Procedures Procedures (including critical care time)  Medications Ordered in UC Medications - No data to display  Initial Impression / Assessment and Plan / UC Course  I have reviewed the triage vital signs and the nursing notes.  Pertinent labs & imaging results that were available during my care of the patient were  reviewed by me and considered in my medical decision making (see chart for details).    Patient is well-appearing, afebrile, not tachycardic, not tachypneic.  She is slightly hypertensive today, however reports this is her normal doctors offices.  Her blood pressure is normal earlier today at home.  She has no symptoms of elevated blood pressure today.  Oxygen saturation is 89% on room air.  This is her baseline per her report and per review of chart.  She is not in any acute distress, breathing is even and unlabored.  On examination, I believe she has periorbital cellulitis of the right upper eyelid.  We will start clindamycin to treat this.  Strict ER precautions discussed and return precautions discussed.  The patient was given the opportunity to ask questions.  Note given for work.  All questions answered to their satisfaction.  The patient is in agreement to this plan.    Final Clinical Impressions(s) / UC Diagnoses   Final diagnoses:  Periorbital cellulitis of right eye     Discharge Instructions      I believe you have an infection in the skin around your eye.  Please start on the oral antibiotic to treat this.  If symptoms are not better within 24 hours or if they worsen, go to the Emergency Room.       ED Prescriptions     Medication Sig Dispense Auth. Provider   clindamycin (CLEOCIN) 300 MG capsule Take 1 capsule (300 mg total) by mouth 3 (three) times daily for 7 days. 21 capsule Eulogio Bear, NP      PDMP not reviewed this encounter.   Eulogio Bear, NP 12/23/21 [phone]

## 2021-12-23 NOTE — Discharge Instructions (Addendum)
I believe you have an infection in the skin around your eye.  Please start on the oral antibiotic to treat this.  If symptoms are not better within 24 hours or if they worsen, go to the Emergency Room.

## 2021-12-24 ENCOUNTER — Encounter: Payer: Medicare HMO | Admitting: Family Medicine

## 2021-12-24 ENCOUNTER — Encounter: Payer: Medicare HMO | Admitting: Nurse Practitioner

## 2021-12-28 ENCOUNTER — Encounter: Payer: Self-pay | Admitting: Family Medicine

## 2021-12-28 ENCOUNTER — Ambulatory Visit (INDEPENDENT_AMBULATORY_CARE_PROVIDER_SITE_OTHER): Payer: Medicare HMO | Admitting: Family Medicine

## 2021-12-28 VITALS — BP 118/72 | HR 88 | Ht 67.0 in | Wt 150.0 lb

## 2021-12-28 DIAGNOSIS — E1169 Type 2 diabetes mellitus with other specified complication: Secondary | ICD-10-CM

## 2021-12-28 DIAGNOSIS — Z23 Encounter for immunization: Secondary | ICD-10-CM

## 2021-12-28 DIAGNOSIS — Z0001 Encounter for general adult medical examination with abnormal findings: Secondary | ICD-10-CM | POA: Diagnosis not present

## 2021-12-28 DIAGNOSIS — R7301 Impaired fasting glucose: Secondary | ICD-10-CM | POA: Diagnosis not present

## 2021-12-28 DIAGNOSIS — L03213 Periorbital cellulitis: Secondary | ICD-10-CM

## 2021-12-28 DIAGNOSIS — E559 Vitamin D deficiency, unspecified: Secondary | ICD-10-CM | POA: Diagnosis not present

## 2021-12-28 DIAGNOSIS — E038 Other specified hypothyroidism: Secondary | ICD-10-CM | POA: Diagnosis not present

## 2021-12-28 MED ORDER — AMOXICILLIN-POT CLAVULANATE 875-125 MG PO TABS: 1.0000 | ORAL_TABLET | Freq: Two times a day (BID) | ORAL | 0 refills | Status: AC

## 2021-12-28 NOTE — Assessment & Plan Note (Signed)
Will discontinue clindamycin 300 mg 3 times daily due to medication intolerance We will start patient on Augmentin and treat for 5 days

## 2021-12-28 NOTE — Progress Notes (Signed)
Complete physical exam  Patient: Leslie [name]   DOB: 1945-12-22   76 y.o. Female  MRN: [id number]  Subjective:    Chief Complaint  Patient presents with   Annual Exam    Here for CPE, pt reports pain in right eye, onset of sx began 12/28/21, went to urgent care and they gave her antibiotic she is still taking medication, reports having trouble seeing on her left eye yesterday only for a few minutes less than 5 minutes.  Would like to discuss diabetic eye exam in the office if available.     Leslie [name] is a 76 y.o. female who presents today for a complete physical exam. She reports consuming a general diet. The patient does not participate in regular exercise at present. She generally feels well. She reports sleeping well. She does have additional problems to discuss today.   Periorbital cellulitis of right eye: She was started on clindamycin 300 mg 3 times daily on 12/23/2021 for periorbital cellulitis.  She reports taking the medication every other day to decrease GI side effects, noting that the medication makes her feel sick.  She noted that the swelling and pain has decreased.   Most recent fall risk assessment:    12/28/2021   10:03 AM  Fall Risk   Falls in the past year? 0  Number falls in past yr: 0  Injury with Fall? 0  Risk for fall due to : No Fall Risks  Follow up Falls evaluation completed     Most recent depression screenings:    12/28/2021   10:03 AM 08/24/2021    9:01 AM  PHQ 2/9 Scores  PHQ - 2 Score 0 0    Vision:Not within last year  and Dental: No current dental problems and No regular dental care   Patient Active Problem List   Diagnosis Date Noted   Encounter for annual general medical examination with abnormal findings in adult 12/28/2021   Periorbital cellulitis of right eye 12/28/2021   Need for immunization against influenza 12/28/2021   Diabetes mellitus ([place]) 08/24/2021   Vitamin D deficiency 08/24/2021   Bilateral lower extremity edema  04/30/2021   Aortic atherosclerosis ([place]) 09/10/2020   Acute on chronic diastolic HF (heart failure) (HCC)    COPD (chronic obstructive pulmonary disease) ([place]) 03/08/2020   Acute CHF ([place]) 03/07/2020   Cough    Oropharyngeal dysphagia    Vitamin D deficiency disease 06/18/2019   CHF (congestive heart failure) ([place]) 03/19/2015   Hypertension 03/19/2015   Peripheral vascular disease ([place]) 03/19/2015   Congestive heart failure (CHF) ([place]) 03/19/2015   Aftercare following surgery of the circulatory system, NEC 05/14/2012   Carotid artery stenosis 03/12/2012   Non compliance w medication regimen secondary to cost 10/04/2011   Arthritis, rheumatoid ([place]) 10/04/2011   Dizziness 10/03/2011   PAD, high grade RICA by MRI 10/03/11 10/03/2011   Tobacco abuse 10/03/2011   LVH (left ventricular hypertrophy) 10/03/2011   Renal artery stenosis: left 50% 2005 10/03/2011   HLD (hyperlipidemia) 11/21/2007   Past Medical History:  Diagnosis Date   Back pain    DDD   Carotid artery occlusion    Cough    Diastolic heart failure (HCC)    Dry skin    Essential hypertension    History of bruising easily    History of colon polyps    History of migraine    History of pneumonia    Hyperlipidemia    PAD (peripheral artery disease) ([place])  Panic attack    Rheumatoid arthritis(714.0)    Past Surgical History:  Procedure Laterality Date   ABDOMINAL HYSTERECTOMY     ANGIOPLASTY  04/20/2012   Procedure: ANGIOPLASTY;  Surgeon: Serafina Mitchell, MD;  Location: [place];  Service: Vascular;  Laterality: Right;  Using 1cm x6 cm vascu-guard patch    [place]  01/31/2005   CAROTID ANGIOGRAM N/A 01/06/2012   Procedure: CAROTID ANGIOGRAM;  Surgeon: Lorretta Harp, MD;  [facility] C[facility];  Service: Cardiovascular;  Laterality: N/A;   CARPAL TUNNEL RELEASE     CHOLECYSTECTOMY     COLONOSCOPY     CORONARY ANGIOPLASTY  01/06/12/2005/2006    2 stents   EGD/TCS  03/2004    Dr. Gala Romney: Tiny distal esophageal erosions, prepyloric antral erosions/ulcerations, internal hemorrhoids, diminutive polyp in the rectosigmoid junction removed (hyperplastic).  Pathology not available but reportedly she had H. pylori which was treated.   ENDARTERECTOMY  04/20/2012   Procedure: ENDARTERECTOMY CAROTID;  Surgeon: Serafina Mitchell, MD;  Location: [place];  Service: Vascular;  Laterality: Right;   ESOPHAGOGASTRODUODENOSCOPY  07/2004   Dr. Gala Romney: Previous antral ulcer was completely healed.  EGD normal.   GIVENS CAPSULE STUDY     ???   leg stents     LOWER EXTREMITY ANGIOGRAM N/A 01/06/2012   Procedure: LOWER EXTREMITY ANGIOGRAM;  Surgeon: Lorretta Harp, MD;  Location: [facility] CATH LAB;  Service: Cardiovascular;  Laterality: N/A;   TOTAL KNEE ARTHROPLASTY     TRIGGER FINGER RELEASE     ulnar nerve surgery     Social History   Tobacco Use   Smoking status: Every Day    Packs/day: 0.50    Years: 50.00    Total pack years: 25.00    Types: Cigarettes    Start date: 1961   Smokeless tobacco: Never   Tobacco comments:    Smoke 1/2 pack per day 09/04/20. Not willing to quit now.  Vaping Use   Vaping Use: Never used  Substance Use Topics   Alcohol use: Not Currently   Drug use: No   Social History   Socioeconomic History   Marital status: Divorced    Spouse name: Not on file   Number of children: 2   Years of education: 12   Highest education level: Not on file  Occupational History    Comment: not working currently   Occupation: retired  Tobacco Use   Smoking status: Every Day    Packs/day: 0.50    Years: 50.00    Total pack years: 25.00    Types: Cigarettes    Start date: 1961   Smokeless tobacco: Never   Tobacco comments:    Smoke 1/2 pack per day 09/04/20. Not willing to quit now.  Vaping Use   Vaping Use: Never used  Substance and Sexual Activity   Alcohol use: Not Currently   Drug use: No   Sexual activity: Yes  Other Topics Concern   Not on file  Social  History Narrative   Divorced.Lives alone.Works at [employer].   Social Determinants of Health   Financial Resource Strain: Medium Risk (03/30/2021)   Overall Financial Resource Strain (CARDIA)    Difficulty of Paying Living Expenses: Somewhat hard  Food Insecurity: No Food Insecurity (03/30/2021)   Hunger Vital Sign    Worried About Running Out of Food in the Last Year: Never true    Ran Out of Food in the Last Year: Never  true  Transportation Needs: No Transportation Needs (03/30/2021)   PRAPARE - Hydrologist (Medical): No    Lack of Transportation (Non-Medical): No  Physical Activity: Inactive (03/30/2021)   Exercise Vital Sign    Days of Exercise per Week: 0 days    Minutes of Exercise per Session: 0 min  Stress: No Stress Concern Present (03/30/2021)   [place]    Feeling of Stress : Not at all  Social Connections: Moderately Integrated (03/30/2021)   Social Connection and Isolation Panel [NHANES]    Frequency of Communication with Friends and Family: More than three times a week    Frequency of Social Gatherings with Friends and Family: Three times a week    Attends Religious Services: More than 4 times per year    Active Member of Clubs or Organizations: Yes    Attends Archivist Meetings: Never    Marital Status: Divorced  Human resources officer Violence: Not At Risk (03/30/2021)   Humiliation, Afraid, Rape, and Kick questionnaire    Fear of Current or Ex-Partner: No    Emotionally Abused: No    Physically Abused: No    Sexually Abused: No   Family Status  Relation Name Status   Mother  Deceased   Father  Deceased   Sister  (Not Specified)   Brother  (Not Specified)   Daughter  Alive   Daughter  Alive   Neg Hx  (Not Specified)   Family History  Problem Relation Age of Onset   Hyperlipidemia Mother    Hypertension Mother    Cancer Father        Brain   Diabetes  Sister    Hypertension Sister    Diabetes Brother    Heart disease Brother    Hypertension Brother    Heart attack Brother    Colon cancer Neg Hx    Allergies  Allergen Reactions   Latex Other (See Comments)    Burns skin    Tape Other (See Comments) and Itching    Burns skin    Atorvastatin Other (See Comments)    myalgia myalgia      Patient Care Team: [name], FNP as PCP - General (Family Medicine) Satira Sark, MD as PCP - Cardiology (Cardiology) [place]Burning, Anderson Malta, PA-C (Inactive) (Dermatology)   Outpatient Medications Prior to Visit  Medication Sig   albuterol (PROVENTIL) (2.5 MG/3ML) 0.083% nebulizer solution Take 3 mLs (2.5 mg total) by nebulization every 6 (six) hours as needed for wheezing or shortness of breath.   albuterol (VENTOLIN HFA) 108 (90 Base) MCG/ACT inhaler Inhale 2 puffs into the lungs every 6 (six) hours as needed for wheezing or shortness of breath. INHALE 2 INHALATIONS INTO THE LUNGS EVERY 6 HOURS AS NEEDED FOR WHEEZING Strength: 108 (90 Base) MCG/ACT   aspirin EC 81 MG tablet Take 81 mg by mouth daily.   Cholecalciferol (VITAMIN D3) 25 MCG (1000 UT) CAPS Take 1 capsule (1,000 Units total) by mouth daily.   cilostazol (PLETAL) 100 MG tablet Take 1 tablet (100 mg total) by mouth 2 (two) times daily before a meal.   ibuprofen (ADVIL) 800 MG tablet TAKE 1 TABLET EVERY DAY AS NEEDED FOR MODERATE PAIN.   losartan (COZAAR) 50 MG tablet TAKE (1) TABLET BY MOUTH IN THE MORNING AND AT BEDTIME   metoprolol succinate (TOPROL-XL) 25 MG 24 hr tablet Take 1 tablet (25 mg total) by mouth daily.  pantoprazole (PROTONIX) 40 MG tablet Take 1 tablet (40 mg total) by mouth daily.   potassium chloride SA (KLOR-CON) 20 MEQ tablet Take 1 tablet (20 mEq total) by mouth daily as needed.   rosuvastatin (CRESTOR) 10 MG tablet TAKE 1 TABLET BY MOUTH ONCE A DAY.   SYMBICORT 80-4.5 MCG/ACT inhaler INHALE 2 PUFFS TWICE DAILY   [DISCONTINUED] clindamycin (CLEOCIN)  300 MG capsule Take 1 capsule (300 mg total) by mouth 3 (three) times daily for 7 days.   furosemide (LASIX) 20 MG tablet Take 1 tablet (20 mg total) by mouth daily as needed.   No facility-administered medications prior to visit.    ROS        Objective:     BP 118/72   Pulse 88   Ht $R'5\' 7"'Uh$  (1.702 m)   Wt 150 lb (68 kg)   SpO2 92%   BMI 23.49 kg/m  BP Readings from Last 3 Encounters:  12/28/21 118/72  12/23/21 (!) 187/73  08/24/21 97/64   Wt Readings from Last 3 Encounters:  12/28/21 150 lb (68 kg)  08/24/21 151 lb (68.5 kg)  04/30/21 160 lb (72.6 kg)      Physical Exam   No results found for any visits on 12/28/21. Last CBC Lab Results  Component Value Date   WBC 4.2 08/20/2021   HGB 14.0 08/20/2021   HCT 43.1 08/20/2021   MCV 83 08/20/2021   MCH 27.0 08/20/2021   RDW 15.4 08/20/2021   PLT 242 09/73/5329   Last metabolic panel Lab Results  Component Value Date   GLUCOSE 86 08/20/2021   NA 139 08/20/2021   K 4.8 08/20/2021   CL 101 08/20/2021   CO2 25 08/20/2021   BUN 8 08/20/2021   CREATININE 0.92 08/20/2021   EGFR 65 08/20/2021   CALCIUM 9.8 08/20/2021   PHOS 2.4 (L) 03/09/2020   PROT 7.0 08/20/2021   ALBUMIN 4.0 08/20/2021   LABGLOB 3.0 08/20/2021   AGRATIO 1.3 08/20/2021   BILITOT 0.8 08/20/2021   ALKPHOS 82 08/20/2021   AST 25 08/20/2021   ALT 20 08/20/2021   ANIONGAP 9 03/09/2020   Last lipids Lab Results  Component Value Date   CHOL 145 08/20/2021   HDL 54 08/20/2021   LDLCALC 77 08/20/2021   TRIG 71 08/20/2021   CHOLHDL 2.7 08/20/2021   Last hemoglobin A1c Lab Results  Component Value Date   HGBA1C 6.5 (H) 08/20/2021   Last thyroid functions Lab Results  Component Value Date   TSH 0.874 08/20/2021   T4TOTAL 8.8 02/04/2019   Last vitamin D Lab Results  Component Value Date   VD25OH 24.9 (L) 08/20/2021   Last vitamin B12 and Folate No results found for: "VITAMINB12", "FOLATE"      Assessment & Plan:    Routine  Health Maintenance and Physical Exam  Immunization History  Administered Date(s) Administered   Fluad Quad(high Dose 65+) 03/19/2019, 12/28/2021   Influenza, High Dose Seasonal PF 02/01/2018   Influenza,inj,Quad PF,6+ Mos 03/21/2015, 04/21/2016, 03/28/2017, 12/15/2020   Influenza-Unspecified 03/21/2015   Moderna Sars-Covid-2 Vaccination 04/27/2019, 05/28/2019   Pneumococcal Conjugate-13 09/15/2016, 09/16/2017   Pneumococcal Polysaccharide-23 09/15/2016   Tdap 09/15/2016    Health Maintenance  Topic Date Due   OPHTHALMOLOGY EXAM  Never done   Zoster Vaccines- Shingrix (1 of 2) Never done   Diabetic kidney evaluation - Urine ACR  10/17/2018   COVID-19 Vaccine (3 - Moderna series) 02/13/2022 (Originally 07/23/2019)   HEMOGLOBIN A1C  02/19/2022   Diabetic  kidney evaluation - GFR measurement  08/21/2022   FOOT EXAM  12/29/2022   TETANUS/TDAP  09/16/2026   COLONOSCOPY (Pts 45-59yr Insurance coverage will need to be confirmed)  02/04/2027   Pneumonia Vaccine 75 Years old  Completed   INFLUENZA VACCINE  Completed   DEXA SCAN  Completed   Hepatitis C Screening  Completed   HPV VACCINES  Aged Out    Discussed health benefits of physical activity, and encouraged her to engage in regular exercise appropriate for her age and condition.  Problem List Items Addressed This Visit       Endocrine   Diabetes mellitus ([place]   Relevant Orders   HM Diabetes Foot Exam (Completed)   Microalbumin / creatinine urine ratio     Other   Vitamin D deficiency   Relevant Orders   Vitamin D (25 hydroxy)   Encounter for annual general medical examination with abnormal findings in adult - Primary    Physical exam as documented Counseling is done on healthy lifestyle involving commitment to 150 minutes of exercise per week,  Discussed heart-healthy diet and attaining a healthy weight Changes in health habits are decided on by the patient with goals and time frames set for achieving them. Immunization  and cancer screening needs are specifically addressed at this visit  She received her flu vaccine today          Periorbital cellulitis of right eye    Will discontinue clindamycin 300 mg 3 times daily due to medication intolerance We will start patient on Augmentin and treat for 5 days       Relevant Medications   amoxicillin-clavulanate (AUGMENTIN) 875-125 MG tablet   Need for immunization against influenza    Patient educated on CDC recommendation for the vaccine. Verbal consent was obtained from the patient, vaccine administered by nurse, no sign of adverse reactions noted at this time. Patient education on arm soreness and use of tylenol or ibuprofen for this patient  was discussed. Patient educated on the signs and symptoms of adverse effect and advise to contact the office if they occur.      Other Visit Diagnoses     Immunization due       Relevant Orders   Flu Vaccine QUAD High Dose(Fluad) (Completed)   IFG (impaired fasting glucose)       Relevant Orders   CBC with Differential/Platelet   CMP14+EGFR   Lipid Profile   Hemoglobin A1C   Other specified hypothyroidism       Relevant Orders   TSH + free T4      Return in about 3 months (around 03/30/2022).     GAlvira Monday FNP

## 2021-12-28 NOTE — Patient Instructions (Addendum)
I appreciate the opportunity to provide care to you today!    Follow up:  3 months  Labs: please stop by the lab during the week to get your blood drawn (CBC, CMP, TSH, Lipid profile, HgA1c, Vit D)  Please start taking the new prescription for  infection of the right eye   Please continue to a heart-healthy diet and increase your physical activities. Try to exercise for 53mns at least three times a week.      It was a pleasure to see you and I look forward to continuing to work together on your health and well-being. Please do not hesitate to call the office if you need care or have questions about your care.   Have a wonderful day and week. With Gratitude, GAlvira MondayMSN, FNP-BC

## 2021-12-28 NOTE — Assessment & Plan Note (Signed)
Physical exam as documented Counseling is done on healthy lifestyle involving commitment to 150 minutes of exercise per week,  Discussed heart-healthy diet and attaining a healthy weight Changes in health habits are decided on by the patient with goals and time frames set for achieving them. Immunization and cancer screening needs are specifically addressed at this visit  She received her flu vaccine today

## 2021-12-28 NOTE — Assessment & Plan Note (Signed)
Patient educated on CDC recommendation for the vaccine. Verbal consent was obtained from the patient, vaccine administered by nurse, no sign of adverse reactions noted at this time. Patient education on arm soreness and use of tylenol or ibuprofen for this patient  was discussed. Patient educated on the signs and symptoms of adverse effect and advise to contact the office if they occur.  

## 2022-01-14 NOTE — Addendum Note (Signed)
Addended byAlvira Monday on: 01/14/2022 05:34 PM   Modules accepted: Level of Service

## 2022-02-03 ENCOUNTER — Other Ambulatory Visit: Payer: Self-pay | Admitting: Nurse Practitioner

## 2022-02-03 ENCOUNTER — Other Ambulatory Visit: Payer: Self-pay | Admitting: Surgery

## 2022-02-03 DIAGNOSIS — I10 Essential (primary) hypertension: Secondary | ICD-10-CM

## 2022-02-04 ENCOUNTER — Telehealth: Payer: Self-pay

## 2022-02-04 DIAGNOSIS — I70213 Atherosclerosis of native arteries of extremities with intermittent claudication, bilateral legs: Secondary | ICD-10-CM

## 2022-02-04 DIAGNOSIS — I6523 Occlusion and stenosis of bilateral carotid arteries: Secondary | ICD-10-CM

## 2022-02-04 NOTE — Telephone Encounter (Signed)
Pt sent refill request for Pletal. Upon review of chart, pt was overdue for 1 yr f/u. Called pt, appts scheduled from recall, refill sent in for 30 days worth to last until appt. Confirmed understanding.

## 2022-02-21 ENCOUNTER — Ambulatory Visit (HOSPITAL_COMMUNITY): Payer: Medicare HMO

## 2022-02-21 ENCOUNTER — Other Ambulatory Visit: Payer: Self-pay | Admitting: Nurse Practitioner

## 2022-02-21 ENCOUNTER — Ambulatory Visit (HOSPITAL_COMMUNITY): Payer: Medicare HMO | Attending: Surgery

## 2022-02-21 ENCOUNTER — Ambulatory Visit: Payer: Medicare HMO

## 2022-02-21 DIAGNOSIS — M545 Low back pain, unspecified: Secondary | ICD-10-CM

## 2022-03-28 DIAGNOSIS — E038 Other specified hypothyroidism: Secondary | ICD-10-CM | POA: Diagnosis not present

## 2022-03-28 DIAGNOSIS — R7301 Impaired fasting glucose: Secondary | ICD-10-CM | POA: Diagnosis not present

## 2022-03-28 DIAGNOSIS — E559 Vitamin D deficiency, unspecified: Secondary | ICD-10-CM | POA: Diagnosis not present

## 2022-03-29 ENCOUNTER — Other Ambulatory Visit: Payer: Self-pay | Admitting: Family Medicine

## 2022-03-29 DIAGNOSIS — E1169 Type 2 diabetes mellitus with other specified complication: Secondary | ICD-10-CM

## 2022-03-29 LAB — CBC WITH DIFFERENTIAL/PLATELET
Basophils Absolute: 0 10*3/uL (ref 0.0–0.2)
Basos: 1 %
EOS (ABSOLUTE): 0.1 10*3/uL (ref 0.0–0.4)
Eos: 1 %
Hematocrit: 44 % (ref 34.0–46.6)
Hemoglobin: 13.7 g/dL (ref 11.1–15.9)
Immature Grans (Abs): 0 10*3/uL (ref 0.0–0.1)
Immature Granulocytes: 1 %
Lymphocytes Absolute: 1.2 10*3/uL (ref 0.7–3.1)
Lymphs: 32 %
MCH: 26.6 pg (ref 26.6–33.0)
MCHC: 31.1 g/dL — ABNORMAL LOW (ref 31.5–35.7)
MCV: 85 fL (ref 79–97)
Monocytes Absolute: 0.5 10*3/uL (ref 0.1–0.9)
Monocytes: 14 %
Neutrophils Absolute: 2 10*3/uL (ref 1.4–7.0)
Neutrophils: 51 %
Platelets: 227 10*3/uL (ref 150–450)
RBC: 5.15 x10E6/uL (ref 3.77–5.28)
RDW: 15 % (ref 11.7–15.4)
WBC: 3.9 10*3/uL (ref 3.4–10.8)

## 2022-03-29 LAB — VITAMIN D 25 HYDROXY (VIT D DEFICIENCY, FRACTURES): Vit D, 25-Hydroxy: 50.6 ng/mL (ref 30.0–100.0)

## 2022-03-29 LAB — CMP14+EGFR
ALT: 23 IU/L (ref 0–32)
AST: 33 IU/L (ref 0–40)
Albumin/Globulin Ratio: 1.1 — ABNORMAL LOW (ref 1.2–2.2)
Albumin: 3.7 g/dL — ABNORMAL LOW (ref 3.8–4.8)
Alkaline Phosphatase: 92 IU/L (ref 44–121)
BUN/Creatinine Ratio: 11 — ABNORMAL LOW (ref 12–28)
BUN: 10 mg/dL (ref 8–27)
Bilirubin Total: 2.6 mg/dL — ABNORMAL HIGH (ref 0.0–1.2)
CO2: 26 mmol/L (ref 20–29)
Calcium: 9.1 mg/dL (ref 8.7–10.3)
Chloride: 97 mmol/L (ref 96–106)
Creatinine, Ser: 0.95 mg/dL (ref 0.57–1.00)
Globulin, Total: 3.4 g/dL (ref 1.5–4.5)
Glucose: 117 mg/dL — ABNORMAL HIGH (ref 70–99)
Potassium: 4.3 mmol/L (ref 3.5–5.2)
Sodium: 142 mmol/L (ref 134–144)
Total Protein: 7.1 g/dL (ref 6.0–8.5)
eGFR: 62 mL/min/{1.73_m2} (ref >59)

## 2022-03-29 LAB — LIPID PANEL
Chol/HDL Ratio: 3.4 ratio (ref 0.0–4.4)
Cholesterol, Total: 139 mg/dL (ref 100–199)
HDL: 41 mg/dL (ref >39)
LDL Chol Calc (NIH): 81 mg/dL (ref 0–99)
Triglycerides: 86 mg/dL (ref 0–149)
VLDL Cholesterol Cal: 17 mg/dL (ref 5–40)

## 2022-03-29 LAB — HEMOGLOBIN A1C
Est. average glucose Bld gHb Est-mCnc: 148 mg/dL
Hgb A1c MFr Bld: 6.8 % — ABNORMAL HIGH (ref 4.8–5.6)

## 2022-03-29 LAB — TSH+FREE T4
Free T4: 1.53 ng/dL (ref 0.82–1.77)
TSH: 1.01 u[IU]/mL (ref 0.450–4.500)

## 2022-03-29 MED ORDER — METFORMIN HCL 500 MG PO TABS: 500.0000 mg | ORAL_TABLET | Freq: Every day | ORAL | 1 refills | Status: DC

## 2022-03-29 NOTE — Progress Notes (Signed)
Please inform the patient that hemoglobin A1c indicates that she is diabetic.  I recommend decreasing her intake of high sugar foods with increased physical activities.  A prescription for metformin 500 mg has been sent to her pharmacy to start taking.  All other labs are stable

## 2022-04-05 ENCOUNTER — Ambulatory Visit (INDEPENDENT_AMBULATORY_CARE_PROVIDER_SITE_OTHER): Payer: Medicare HMO | Admitting: Family Medicine

## 2022-04-05 ENCOUNTER — Encounter: Payer: Self-pay | Admitting: Family Medicine

## 2022-04-05 VITALS — BP 132/83 | HR 86 | Ht 67.0 in | Wt 150.1 lb

## 2022-04-05 DIAGNOSIS — E559 Vitamin D deficiency, unspecified: Secondary | ICD-10-CM | POA: Diagnosis not present

## 2022-04-05 DIAGNOSIS — R600 Localized edema: Secondary | ICD-10-CM

## 2022-04-05 DIAGNOSIS — E038 Other specified hypothyroidism: Secondary | ICD-10-CM

## 2022-04-05 DIAGNOSIS — H04123 Dry eye syndrome of bilateral lacrimal glands: Secondary | ICD-10-CM

## 2022-04-05 DIAGNOSIS — H539 Unspecified visual disturbance: Secondary | ICD-10-CM

## 2022-04-05 DIAGNOSIS — R7301 Impaired fasting glucose: Secondary | ICD-10-CM

## 2022-04-05 DIAGNOSIS — I10 Essential (primary) hypertension: Secondary | ICD-10-CM | POA: Diagnosis not present

## 2022-04-05 DIAGNOSIS — Z122 Encounter for screening for malignant neoplasm of respiratory organs: Secondary | ICD-10-CM

## 2022-04-05 DIAGNOSIS — E7849 Other hyperlipidemia: Secondary | ICD-10-CM | POA: Diagnosis not present

## 2022-04-05 MED ORDER — FUROSEMIDE 20 MG PO TABS: 20.0000 mg | ORAL_TABLET | Freq: Every day | ORAL | 1 refills | Status: DC | PRN

## 2022-04-05 MED ORDER — POTASSIUM CHLORIDE CRYS ER 20 MEQ PO TBCR: 20.0000 meq | EXTENDED_RELEASE_TABLET | Freq: Every day | ORAL | 1 refills | Status: DC | PRN

## 2022-04-05 MED ORDER — ARTIFICIAL TEARS OP SOLN: 2.0000 [drp] | OPHTHALMIC | 12 refills | Status: AC | PRN

## 2022-04-05 NOTE — Assessment & Plan Note (Signed)
She reports increased swelling her lower extremities  since 07/08/2021 She has been taking Lasix 20 mg daily, but notes to have increased the dose to 40 mg daily since onset of symptoms She reports minimal relief of her swelling with Lasix She reports occasional shortness of breath with exertion, but denies chest pain, palpitation, and orthopnea Patient has a history of left ventricular hypertrophy and has not recently follow-up with her cardiologist Will refill Lasix and encourage patient to continue taking potassium She reports not taking potassium supplement with her Lasix in the past few months Will assess CBC and CMP today Encouraged to follow-up with her cardiologist as soon as possible Recommended conservative management with low-sodium diet, compression stockings, and leg elevation

## 2022-04-05 NOTE — Patient Instructions (Addendum)
   I appreciate the opportunity to provide care to you today!    Follow up:  3 months  Please pick up your medication at the pharmacy   Labs: please stop by the lab today or during the week  to get your blood drawn (CBC, CMP, TSH, Lipid profile, HgA1c, Vit D)  Recommend conservative managements to help decrease swelling in your feet.   -Reduce salt (sodium) in your diet -- Sodium, which is found in table salt and processed foods, can worsen edema. Reducing the amount of salt you consume can help to reduce edema, especially if you also take a diuretic.  -Compression stockings -- Leg edema can be prevented and treated with the use of compression stockings. Stockings are available in several heights, including knee-high, thigh-high, and pantyhose. Knee-high stockings are sufficient for most patients. - Body positioning -- Leg, ankle, and foot edema can be improved by elevating the legs above heart level for 30 minutes three or four times per day. Elevating the legs may be sufficient to reduce or eliminate edema    For type 2 diabetes and hyperlipidemia:  I recommend avoiding simple carbohydrates including cakes, sweet desserts, ice cream, soda (diet or regular), sweet tea, candies, chips, cookies, store-bought juices, alcohol in excess of 1-2 drinks a day, lemonade, artificial sweeteners, donuts, coffee creamers, and sugar-free products.  I recommend avoiding greasy, fatty foods with increased physical activity.  I recommend smoking cessation Smoking is harmful to your health and increases your risk for cancer, COPD, high blood pressure, cataracts, digestive problems, or health problems , such as gum disease, mouth sores, and tooth loss and loss of taste and smell. Smoking irritates your throat and causes coughing.     Please continue to a heart-healthy diet and increase your physical activities. Try to exercise for 60mns at least five times a week.      It was a pleasure to see you and I  look forward to continuing to work together on your health and well-being. Please do not hesitate to call the office if you need care or have questions about your care.   Have a wonderful day and week. With Gratitude, GAlvira MondayMSN, FNP-BC

## 2022-04-05 NOTE — Progress Notes (Signed)
Established Patient Office Visit  Subjective:  Patient ID: Leslie [name], female    DOB: July 19, 1945  Age: 77 y.o. MRN: [id number]  CC:  Chief Complaint  Patient presents with   Follow-up    3 month f/u, pt reports retaining fluid in her legs since her last visit in 12/28/2021, causes her to have difficulty walking.    Eye Problem    Pt reports eye swelling on her right eye since 4 weeks ago12/19/2023, she's over due for an eye exam, needs a referral to have her eye looked at and to go ahead and have an eye exam.     HPI Leslie [name] is a 77 y.o. female with past medical history of left ventricular hypertrophy, congestive heart failure, hypertension, diabetes, COPD presents for f/u of  chronic medical conditions. For the details of today's visit, please refer to the assessment and plan.     Past Medical History:  Diagnosis Date   Back pain    DDD   Carotid artery occlusion    Cough    Diastolic heart failure (HCC)    Dry skin    Essential hypertension    History of bruising easily    History of colon polyps    History of migraine    History of pneumonia    Hyperlipidemia    PAD (peripheral artery disease) (HCC)    Panic attack    Rheumatoid arthritis(714.0)     Past Surgical History:  Procedure Laterality Date   ABDOMINAL HYSTERECTOMY     ANGIOPLASTY  04/20/2012   Procedure: ANGIOPLASTY;  Surgeon: Serafina Mitchell, MD;  Location: [place];  Service: Vascular;  Laterality: Right;  Using 1cm x6 cm vascu-guard patch    [place]  01/31/2005   CAROTID ANGIOGRAM N/A 01/06/2012   Procedure: CAROTID ANGIOGRAM;  Surgeon: Lorretta Harp, MD;  Location: [facility] C[facility] LAB;  Service: Cardiovascular;  Laterality: N/A;   CARPAL TUNNEL RELEASE     CHOLECYSTECTOMY     COLONOSCOPY     CORONARY ANGIOPLASTY  01/06/12/2005/2006    2 stents   EGD/TCS  03/2004   Dr. Gala Romney: Tiny distal esophageal erosions, prepyloric antral erosions/ulcerations, internal hemorrhoids,  diminutive polyp in the rectosigmoid junction removed (hyperplastic).  Pathology not available but reportedly she had H. pylori which was treated.   ENDARTERECTOMY  04/20/2012   Procedure: ENDARTERECTOMY CAROTID;  Surgeon: Serafina Mitchell, MD;  Location: [place];  Service: Vascular;  Laterality: Right;   ESOPHAGOGASTRODUODENOSCOPY  07/2004   Dr. Gala Romney: Previous antral ulcer was completely healed.  EGD normal.   GIVENS CAPSULE STUDY     ???   leg stents     LOWER EXTREMITY ANGIOGRAM N/A 01/06/2012   Procedure: LOWER EXTREMITY ANGIOGRAM;  Surgeon: Lorretta Harp, MD;  Location: [facility] CATH LAB;  Service: Cardiovascular;  Laterality: N/A;   TOTAL KNEE ARTHROPLASTY     TRIGGER FINGER RELEASE     ulnar nerve surgery      Family History  Problem Relation Age of Onset   Hyperlipidemia Mother    Hypertension Mother    Cancer Father        Brain   Diabetes Sister    Hypertension Sister    Diabetes Brother    Heart disease Brother    Hypertension Brother    Heart attack Brother    Colon cancer Neg Hx     Social History   Socioeconomic History   Marital  status: Divorced    Spouse name: Not on file   Number of children: 2   Years of education: 35   Highest education level: Not on file  Occupational History    Comment: not working currently   Occupation: retired  Tobacco Use   Smoking status: Every Day    Packs/day: 0.50    Years: 50.00    Total pack years: 25.00    Types: Cigarettes    Start date: 1961   Smokeless tobacco: Never   Tobacco comments:    Smoke 1/2 pack per day 09/04/20. Not willing to quit now.  Vaping Use   Vaping Use: Never used  Substance and Sexual Activity   Alcohol use: Not Currently   Drug use: No   Sexual activity: Yes  Other Topics Concern   Not on file  Social History Narrative   Divorced.Lives alone.Works at [employer].   Social Determinants of Health   Financial Resource Strain: Medium Risk (03/30/2021)   Overall Financial Resource Strain  (CARDIA)    Difficulty of Paying Living Expenses: Somewhat hard  Food Insecurity: No Food Insecurity (03/30/2021)   Hunger Vital Sign    Worried About Running Out of Food in the Last Year: Never true    Ran Out of Food in the Last Year: Never true  Transportation Needs: No Transportation Needs (03/30/2021)   PRAPARE - Hydrologist (Medical): No    Lack of Transportation (Non-Medical): No  Physical Activity: Inactive (03/30/2021)   Exercise Vital Sign    Days of Exercise per Week: 0 days    Minutes of Exercise per Session: 0 min  Stress: No Stress Concern Present (03/30/2021)   [place]    Feeling of Stress : Not at all  Social Connections: Moderately Integrated (03/30/2021)   Social Connection and Isolation Panel [NHANES]    Frequency of Communication with Friends and Family: More than three times a week    Frequency of Social Gatherings with Friends and Family: Three times a week    Attends Religious Services: More than 4 times per year    Active Member of Clubs or Organizations: Yes    Attends Archivist Meetings: Never    Marital Status: Divorced  Human resources officer Violence: Not At Risk (03/30/2021)   Humiliation, Afraid, Rape, and Kick questionnaire    Fear of Current or Ex-Partner: No    Emotionally Abused: No    Physically Abused: No    Sexually Abused: No    Outpatient Medications Prior to Visit  Medication Sig Dispense Refill   albuterol (PROVENTIL) (2.5 MG/3ML) 0.083% nebulizer solution Take 3 mLs (2.5 mg total) by nebulization every 6 (six) hours as needed for wheezing or shortness of breath. 75 mL 12   albuterol (VENTOLIN HFA) 108 (90 Base) MCG/ACT inhaler Inhale 2 puffs into the lungs every 6 (six) hours as needed for wheezing or shortness of breath. INHALE 2 INHALATIONS INTO THE LUNGS EVERY 6 HOURS AS NEEDED FOR WHEEZING Strength: 108 (90 Base) MCG/ACT 54 g 6   aspirin  EC 81 MG tablet Take 81 mg by mouth daily.     Cholecalciferol (VITAMIN D3) 25 MCG (1000 UT) CAPS Take 1 capsule (1,000 Units total) by mouth daily. 60 capsule 3   cilostazol (PLETAL) 100 MG tablet TAKE (1) TABLET BY MOUTH TWICE DAILY BEFORE MEALS. 60 tablet 0   ibuprofen (ADVIL) 800 MG tablet TAKE 1 TABLET EVERY DAY  AS NEEDED FOR MODERATE PAIN. 30 tablet 0   losartan (COZAAR) 50 MG tablet TAKE (1) TABLET BY MOUTH IN THE MORNING AND AT BEDTIME 180 tablet 0   metFORMIN (GLUCOPHAGE) 500 MG tablet Take 1 tablet (500 mg total) by mouth daily at 2 PM. 90 tablet 1   metoprolol succinate (TOPROL-XL) 25 MG 24 hr tablet Take 1 tablet (25 mg total) by mouth daily. 90 tablet 3   pantoprazole (PROTONIX) 40 MG tablet Take 1 tablet (40 mg total) by mouth daily. 90 tablet 0   rosuvastatin (CRESTOR) 10 MG tablet TAKE 1 TABLET BY MOUTH ONCE A DAY. 90 tablet 2   SYMBICORT 80-4.5 MCG/ACT inhaler INHALE 2 PUFFS TWICE DAILY 3 each 2   potassium chloride SA (KLOR-CON) 20 MEQ tablet Take 1 tablet (20 mEq total) by mouth daily as needed. 90 tablet 3   furosemide (LASIX) 20 MG tablet Take 1 tablet (20 mg total) by mouth daily as needed. 90 tablet 3   No facility-administered medications prior to visit.    Allergies  Allergen Reactions   Latex Other (See Comments)    Burns skin    Tape Other (See Comments) and Itching    Burns skin    Atorvastatin Other (See Comments)    myalgia myalgia    ROS Review of Systems  HENT:  Negative for congestion.   Eyes:  Positive for itching. Negative for photophobia, pain, discharge, redness and visual disturbance.  Respiratory:  Positive for shortness of breath (sometimes with exertion). Negative for chest tightness and wheezing.   Cardiovascular:  Positive for leg swelling. Negative for chest pain and palpitations.  Gastrointestinal:  Negative for abdominal distention, diarrhea and nausea.      Objective:    Physical Exam HENT:     Head: Normocephalic.      Mouth/Throat:     Mouth: Mucous membranes are moist.  Cardiovascular:     Rate and Rhythm: Normal rate.     Heart sounds: Normal heart sounds.  Pulmonary:     Effort: Pulmonary effort is normal.     Breath sounds: Normal breath sounds.  Musculoskeletal:     Right lower leg: Edema present.     Left lower leg: Edema present.  Neurological:     Mental Status: She is alert.     BP 132/83   Pulse 86   Ht '5\' 7"'$  (1.702 m)   Wt 150 lb 1.3 oz (68.1 kg)   SpO2 92%   BMI 23.51 kg/m  Wt Readings from Last 3 Encounters:  04/05/22 150 lb 1.3 oz (68.1 kg)  12/28/21 150 lb (68 kg)  08/24/21 151 lb (68.5 kg)    Lab Results  Component Value Date   TSH 1.010 03/28/2022   Lab Results  Component Value Date   WBC 3.9 03/28/2022   HGB 13.7 03/28/2022   HCT 44.0 03/28/2022   MCV 85 03/28/2022   PLT 227 03/28/2022   Lab Results  Component Value Date   NA 142 03/28/2022   K 4.3 03/28/2022   CO2 26 03/28/2022   GLUCOSE 117 (H) 03/28/2022   BUN 10 03/28/2022   CREATININE 0.95 03/28/2022   BILITOT 2.6 (H) 03/28/2022   ALKPHOS 92 03/28/2022   AST 33 03/28/2022   ALT 23 03/28/2022   PROT 7.1 03/28/2022   ALBUMIN 3.7 (L) 03/28/2022   CALCIUM 9.1 03/28/2022   ANIONGAP 9 03/09/2020   EGFR 62 03/28/2022   Lab Results  Component Value Date   CHOL  139 03/28/2022   Lab Results  Component Value Date   HDL 41 03/28/2022   Lab Results  Component Value Date   LDLCALC 81 03/28/2022   Lab Results  Component Value Date   TRIG 86 03/28/2022   Lab Results  Component Value Date   CHOLHDL 3.4 03/28/2022   Lab Results  Component Value Date   HGBA1C 6.8 (H) 03/28/2022      Assessment & Plan:  Bilateral lower extremity edema Assessment & Plan: She reports increased swelling her lower extremities  since 07/08/2021 She has been taking Lasix 20 mg daily, but notes to have increased the dose to 40 mg daily since onset of symptoms She reports minimal relief of her swelling with  Lasix She reports occasional shortness of breath with exertion, but denies chest pain, palpitation, and orthopnea Patient has a history of left ventricular hypertrophy and has not recently follow-up with her cardiologist Will refill Lasix and encourage patient to continue taking potassium She reports not taking potassium supplement with her Lasix in the past few months Will assess CBC and CMP today Encouraged to follow-up with her cardiologist as soon as possible Recommended conservative management with low-sodium diet, compression stockings, and leg elevation   Orders: -     Furosemide; Take 1 tablet (20 mg total) by mouth daily as needed.  Dispense: 90 tablet; Refill: 1 -     Potassium Chloride Crys ER; Take 1 tablet (20 mEq total) by mouth daily as needed.  Dispense: 90 tablet; Refill: 1  Visual disturbances Assessment & Plan: Last eye examination was 6 years ago She was seen in the ED on 12/23/2021 for cellulitis of the right eye which has resolved She complains of excessive dryness and itching sensation in the right eye She denies pain, discharge, and redness She would like a referral to ophthalmologist for an eye examination Artificial eye solution ordered for dry eyes Referral placed to ophthalmology   Primary hypertension -     Microalbumin / creatinine urine ratio  Visual disturbance -     Ambulatory referral to Ophthalmology  Screening for lung cancer -     CT CHEST LUNG CANCER SCREENING LOW DOSE WO CONTRAST  Dry eyes -     Artificial Tears; Place 2 drops into both eyes as needed.  Dispense: 15 mL; Refill: 12  IFG (impaired fasting glucose) -     Hemoglobin A1c  Vitamin D deficiency -     VITAMIN D 25 Hydroxy (Vit-D Deficiency, Fractures)  Other specified hypothyroidism -     TSH + free T4  Other hyperlipidemia -     Lipid panel -     CMP14+EGFR -     CBC with Differential/Platelet    Follow-up: Return in about 3 months (around 07/05/2022).   [name], FNP

## 2022-04-05 NOTE — Assessment & Plan Note (Signed)
Last eye examination was 6 years ago She was seen in the ED on 12/23/2021 for cellulitis of the right eye which has resolved She complains of excessive dryness and itching sensation in the right eye She denies pain, discharge, and redness She would like a referral to ophthalmologist for an eye examination Artificial eye solution ordered for dry eyes Referral placed to ophthalmology

## 2022-04-11 ENCOUNTER — Ambulatory Visit (INDEPENDENT_AMBULATORY_CARE_PROVIDER_SITE_OTHER): Payer: Medicare HMO | Admitting: Internal Medicine

## 2022-04-11 ENCOUNTER — Encounter: Payer: Self-pay | Admitting: Internal Medicine

## 2022-04-11 VITALS — BP 107/70 | HR 95 | Resp 16 | Ht 66.0 in | Wt 150.8 lb

## 2022-04-11 DIAGNOSIS — Z0000 Encounter for general adult medical examination without abnormal findings: Secondary | ICD-10-CM | POA: Diagnosis not present

## 2022-04-11 DIAGNOSIS — J440 Chronic obstructive pulmonary disease with acute lower respiratory infection: Secondary | ICD-10-CM

## 2022-04-11 DIAGNOSIS — J209 Acute bronchitis, unspecified: Secondary | ICD-10-CM

## 2022-04-11 MED ORDER — ALBUTEROL SULFATE (2.5 MG/3ML) 0.083% IN NEBU: 2.5000 mg | INHALATION_SOLUTION | Freq: Four times a day (QID) | RESPIRATORY_TRACT | 12 refills | Status: DC | PRN

## 2022-04-11 NOTE — Progress Notes (Signed)
Subjective:   Leslie [name] is a 77 y.o. female who presents for Medicare Annual (Subsequent) preventive examination.  Review of Systems    Review of Systems  All other systems reviewed and are negative.     Objective:    Today's Vitals   04/11/22 1432  BP: 107/70  Pulse: 95  Resp: 16  SpO2: 99%  Weight: 150 lb 12.8 oz (68.4 kg)  Height: '5\' 6"'$  (1.676 m)   Body mass index is 24.34 kg/m.     04/11/2022    2:36 PM 03/30/2021    4:06 PM 03/07/2020   11:05 AM 09/18/2019    7:53 PM 03/20/2015   12:03 AM 03/19/2015    5:59 PM 04/21/2012   12:00 AM  Advanced Directives  Does Patient Have a Medical Advance Directive? No No No No No No Patient does not have advance directive  Would patient like information on creating a medical advance directive? Yes (ED - Information included in AVS) No - Patient declined No - Patient declined   No - patient declined information   Pre-existing out of facility DNR order (yellow form or pink MOST form)       No    Current Medications (verified) Outpatient Encounter Medications as of 04/11/2022  Medication Sig   albuterol (VENTOLIN HFA) 108 (90 Base) MCG/ACT inhaler Inhale 2 puffs into the lungs every 6 (six) hours as needed for wheezing or shortness of breath. INHALE 2 INHALATIONS INTO THE LUNGS EVERY 6 HOURS AS NEEDED FOR WHEEZING Strength: 108 (90 Base) MCG/ACT   Artificial Tears ophthalmic solution Place 2 drops into both eyes as needed.   aspirin EC 81 MG tablet Take 81 mg by mouth daily.   Cholecalciferol (VITAMIN D3) 25 MCG (1000 UT) CAPS Take 1 capsule (1,000 Units total) by mouth daily.   cilostazol (PLETAL) 100 MG tablet TAKE (1) TABLET BY MOUTH TWICE DAILY BEFORE MEALS.   furosemide (LASIX) 20 MG tablet Take 1 tablet (20 mg total) by mouth daily as needed.   ibuprofen (ADVIL) 800 MG tablet TAKE 1 TABLET EVERY DAY AS NEEDED FOR MODERATE PAIN.   losartan (COZAAR) 50 MG tablet TAKE (1) TABLET BY MOUTH IN THE MORNING AND AT BEDTIME    metFORMIN (GLUCOPHAGE) 500 MG tablet Take 1 tablet (500 mg total) by mouth daily at 2 PM.   metoprolol succinate (TOPROL-XL) 25 MG 24 hr tablet Take 1 tablet (25 mg total) by mouth daily.   pantoprazole (PROTONIX) 40 MG tablet Take 1 tablet (40 mg total) by mouth daily.   potassium chloride SA (KLOR-CON M) 20 MEQ tablet Take 1 tablet (20 mEq total) by mouth daily as needed.   rosuvastatin (CRESTOR) 10 MG tablet TAKE 1 TABLET BY MOUTH ONCE A DAY.   SYMBICORT 80-4.5 MCG/ACT inhaler INHALE 2 PUFFS TWICE DAILY   [DISCONTINUED] albuterol (PROVENTIL) (2.5 MG/3ML) 0.083% nebulizer solution Take 3 mLs (2.5 mg total) by nebulization every 6 (six) hours as needed for wheezing or shortness of breath.   albuterol (PROVENTIL) (2.5 MG/3ML) 0.083% nebulizer solution Take 3 mLs (2.5 mg total) by nebulization every 6 (six) hours as needed for wheezing or shortness of breath.   No facility-administered encounter medications on file as of 04/11/2022.    Allergies (verified) Latex, Tape, and Atorvastatin   History: Past Medical History:  Diagnosis Date   Back pain    DDD   Carotid artery occlusion    Cough    Diastolic heart failure (HCC)    Dry  skin    Essential hypertension    History of bruising easily    History of colon polyps    History of migraine    History of pneumonia    Hyperlipidemia    PAD (peripheral artery disease) (HCC)    Panic attack    Rheumatoid arthritis(714.0)    Past Surgical History:  Procedure Laterality Date   ABDOMINAL HYSTERECTOMY     ANGIOPLASTY  04/20/2012   Procedure: ANGIOPLASTY;  Surgeon: Serafina Mitchell, MD;  Location: [facility];  Service: Vascular;  Laterality: Right;  Using 1cm x6 cm vascu-guard patch    [place]  01/31/2005   CAROTID ANGIOGRAM N/A 01/06/2012   Procedure: CAROTID ANGIOGRAM;  Surgeon: Lorretta Harp, MD;  Location: [facility] CATH LAB;  Service: Cardiovascular;  Laterality: N/A;   CARPAL TUNNEL RELEASE     CHOLECYSTECTOMY      COLONOSCOPY     CORONARY ANGIOPLASTY  01/06/12/2005/2006    2 stents   EGD/TCS  03/2004   Dr. Gala Romney: Tiny distal esophageal erosions, prepyloric antral erosions/ulcerations, internal hemorrhoids, diminutive polyp in the rectosigmoid junction removed (hyperplastic).  Pathology not available but reportedly she had H. pylori which was treated.   ENDARTERECTOMY  04/20/2012   Procedure: ENDARTERECTOMY CAROTID;  Surgeon: Serafina Mitchell, MD;  Location: [facility];  Service: Vascular;  Laterality: Right;   ESOPHAGOGASTRODUODENOSCOPY  07/2004   Dr. Gala Romney: Previous antral ulcer was completely healed.  EGD normal.   GIVENS CAPSULE STUDY     ???   leg stents     LOWER EXTREMITY ANGIOGRAM N/A 01/06/2012   Procedure: LOWER EXTREMITY ANGIOGRAM;  Surgeon: Lorretta Harp, MD;  Location: [facility] C[facility] LAB;  Service: Cardiovascular;  Laterality: N/A;   TOTAL KNEE ARTHROPLASTY     TRIGGER FINGER RELEASE     ulnar nerve surgery     Family History  Problem Relation Age of Onset   Hyperlipidemia Mother    Hypertension Mother    Cancer Father        Brain   Diabetes Sister    Hypertension Sister    Diabetes Brother    Heart disease Brother    Hypertension Brother    Heart attack Brother    Colon cancer Neg Hx    Social History   Socioeconomic History   Marital status: Divorced    Spouse name: Not on file   Number of children: 2   Years of education: 12   Highest education level: Not on file  Occupational History    Comment: not working currently   Occupation: retired  Tobacco Use   Smoking status: Every Day    Packs/day: 0.50    Years: 50.00    Total pack years: 25.00    Types: Cigarettes    Start date: 1961   Smokeless tobacco: Never   Tobacco comments:    Smoke 1/2 pack per day 09/04/20. Not willing to quit now.  Vaping Use   Vaping Use: Never used  Substance and Sexual Activity   Alcohol use: Not Currently   Drug use: No   Sexual activity: Yes  Other Topics Concern   Not on file   Social History Narrative   Divorced.Lives alone.Works at [employer].   Social Determinants of Health   Financial Resource Strain: Medium Risk (03/30/2021)   Overall Financial Resource Strain (CARDIA)    Difficulty of Paying Living Expenses: Somewhat hard  Food Insecurity: No Food Insecurity (03/30/2021)   Hunger  Vital Sign    Worried About Charity fundraiser in the Last Year: Never true    Ran Out of Food in the Last Year: Never true  Transportation Needs: No Transportation Needs (03/30/2021)   PRAPARE - Hydrologist (Medical): No    Lack of Transportation (Non-Medical): No  Physical Activity: Inactive (03/30/2021)   Exercise Vital Sign    Days of Exercise per Week: 0 days    Minutes of Exercise per Session: 0 min  Stress: No Stress Concern Present (03/30/2021)   [place]    Feeling of Stress : Not at all  Social Connections: Moderately Integrated (03/30/2021)   Social Connection and Isolation Panel [NHANES]    Frequency of Communication with Friends and Family: More than three times a week    Frequency of Social Gatherings with Friends and Family: Three times a week    Attends Religious Services: More than 4 times per year    Active Member of Clubs or Organizations: Yes    Attends Archivist Meetings: Never    Marital Status: Divorced    Tobacco Counseling Ready to quit: Not Answered Counseling given: Not Answered Tobacco comments: Smoke 1/2 pack per day 09/04/20. Not willing to quit now.   Clinical Intake:  Pre-visit preparation completed: No  Pain : No/denies pain        How often do you need to have someone help you when you read instructions, pamphlets, or other written materials from your doctor or pharmacy?: 1 - Never What is the last grade level you completed in school?: 12th grade  Diabetic?Yes    Activities of Daily Living    04/11/2022    2:37 PM   In your present state of health, do you have any difficulty performing the following activities:  Hearing? 0  Vision? 0  Difficulty concentrating or making decisions? 0  Walking or climbing stairs? 0  Dressing or bathing? 0  Doing errands, shopping? 0    Patient Care Team: [name], [place] as PCP - General (Family Medicine) Satira Sark, MD as PCP - Cardiology (Cardiology) Clark-Burning, Anderson Malta, PA-C (Inactive) (Dermatology)  Indicate any recent Medical Services you may have received from other than [facility] providers in the past year (date may be approximate).     Assessment:   This is a routine wellness examination for [place].  Hearing/Vision screen No results found.  Dietary issues and exercise activities discussed:     Goals Addressed   None    Depression Screen    04/11/2022    2:37 PM 04/05/2022    9:50 AM 12/28/2021   10:03 AM 08/24/2021    9:01 AM 04/30/2021    9:13 AM 03/30/2021    4:01 PM 01/28/2021    9:25 AM  PHQ 2/9 Scores  PHQ - 2 Score 0 3 0 0 0 0 0  PHQ- 9 Score  8         Fall Risk    04/11/2022    2:37 PM 04/11/2022    2:32 PM 04/05/2022    9:50 AM 12/28/2021   10:03 AM 08/24/2021    9:01 AM  Fall Risk   Falls in the past year? 0 0 0 0 0  Number falls in past yr: 0 0 0 0 0  Injury with Fall? 0 0 0 0 0  Risk for fall due to :   No Fall Risks No Fall  Risks No Fall Risks  Follow up   Falls evaluation completed Falls evaluation completed Falls evaluation completed    [place]:  Any stairs in or around the home? No  If so, are there any without handrails? No  Home free of loose throw rugs in walkways, pet beds, electrical cords, etc? Yes  Adequate lighting in your home to reduce risk of falls? Yes   ASSISTIVE DEVICES UTILIZED TO PREVENT FALLS:  Life alert? Yes  Use of a cane, walker or w/c? No  Grab bars in the bathroom? Yes  Shower chair or bench in shower? No  Elevated toilet seat or a handicapped  toilet? No    Cognitive Function:        04/11/2022    2:38 PM 03/30/2021    4:08 PM  6CIT Screen  What Year? 0 points 0 points  What month? 0 points 0 points  What time? 0 points 0 points  Count back from 20 0 points 0 points  Months in reverse 0 points 0 points  Repeat phrase 0 points 0 points  Total Score 0 points 0 points    Immunizations Immunization History  Administered Date(s) Administered   Fluad Quad(high Dose 65+) 03/19/2019, 12/28/2021   Influenza, High Dose Seasonal PF 02/01/2018   Influenza,inj,Quad PF,6+ Mos 03/21/2015, 04/21/2016, 03/28/2017, 12/15/2020   Influenza-Unspecified 03/21/2015   Moderna Sars-Covid-2 Vaccination 04/27/2019, 05/28/2019   Pneumococcal Conjugate-13 09/15/2016, 09/16/2017   Pneumococcal Polysaccharide-23 09/15/2016   Tdap 09/15/2016    TDAP status: Up to date  Flu Vaccine status: Up to date  Pneumococcal vaccine status: Up to date  Covid-19 vaccine status: Declined, Education has been provided regarding the importance of this vaccine but patient still declined. Advised may receive this vaccine at local pharmacy or Health Dept.or vaccine clinic. Aware to provide a copy of the vaccination record if obtained from local pharmacy or Health Dept. Verbalized acceptance and understanding.  Qualifies for Shingles Vaccine? Yes   Zostavax completed No   Shingrix Completed?: Yes  Screening Tests Health Maintenance  Topic Date Due   OPHTHALMOLOGY EXAM  Never done   Diabetic kidney evaluation - Urine ACR  Never done   Lung Cancer Screening  Never done   COVID-19 Vaccine (3 - 2023-24 season) 11/19/2021   Medicare Annual Wellness (AWV)  03/30/2022   Zoster Vaccines- Shingrix (1 of 2) 07/05/2022 (Originally [birth year]/1996)   HEMOGLOBIN A1C  09/26/2022   FOOT EXAM  12/29/2022   Diabetic kidney evaluation - eGFR measurement  03/29/2023   DTaP/Tdap/Td (2 - Td or Tdap) 09/16/2026   COLONOSCOPY (Pts 45-21yr Insurance coverage will need to be  confirmed)  02/04/2027   Pneumonia Vaccine 77 Years old  Completed   INFLUENZA VACCINE  Completed   DEXA SCAN  Completed   Hepatitis C Screening  Completed   HPV VACCINES  Aged Out    Health Maintenance  Health Maintenance Due  Topic Date Due   OPHTHALMOLOGY EXAM  Never done   Diabetic kidney evaluation - Urine ACR  Never done   Lung Cancer Screening  Never done   COVID-19 Vaccine (3 - 2023-24 season) 11/19/2021   Medicare Annual Wellness (AWV)  03/30/2022    Colorectal cancer screening: Type of screening: Colonoscopy. Completed 02/03/2017. Repeat every 10 years  Mammogram status: No longer required due to age.  Bone Density status: Completed 02/04/2019. Results reflect: Bone density results: NORMAL.   Lung Cancer Screening: (Low Dose CT Chest recommended if Age 76-80  years, 30 pack-year currently smoking OR have quit w/in 15years.) does qualify.   Lung Cancer Screening Referral: Schedule on 04/14/2022  Additional Screening:  Hepatitis C Screening: does not qualify; Completed 09/03/2019  Vision Screening: Recommended annual ophthalmology exams for early detection of glaucoma and other disorders of the eye. Is the patient up to date with their annual eye exam?  Yes  Who is the provider or what is the name of the office in which the patient attends annual eye exams? [place] If pt is not established with a provider, would they like to be referred to a provider to establish care? No .   Dental Screening: Recommended annual dental exams for proper oral hygiene  Community Resource Referral / Chronic Care Management: CRR required this visit?  No   CCM required this visit?  No      Plan:     I have personally reviewed and noted the following in the patient's chart:   Medical and social history Use of alcohol, tobacco or illicit drugs  Current medications and supplements including opioid prescriptions. Patient is not currently taking opioid  prescriptions. Functional ability and status Nutritional status Physical activity Advanced directives List of other physicians Hospitalizations, surgeries, and ER visits in previous 12 months Vitals Screenings to include cognitive, depression, and falls Referrals and appointments  In addition, I have reviewed and discussed with patient certain preventive protocols, quality metrics, and best practice recommendations. A written personalized care plan for preventive services as well as general preventive health recommendations were provided to patient.     Lorene Dy, MD   04/11/2022

## 2022-04-11 NOTE — Addendum Note (Signed)
Addended by: Lyndal Pulley on: 04/11/2022 03:03 PM   Modules accepted: Orders

## 2022-04-11 NOTE — Patient Instructions (Addendum)
  Leslie [name] , Thank you for taking time to come for your Medicare Wellness Visit. I appreciate your ongoing commitment to your health goals. Please review the following plan we discussed and let me know if I can assist you in the future.   These are the goals we discussed: Congratulations on quitting smoking!  This is a list of the screening recommended for you and due dates:  Health Maintenance  Topic Date Due   Eye exam for diabetics  Never done   Yearly kidney health urinalysis for diabetes  Never done   Screening for Lung Cancer  Never done   COVID-19 Vaccine (3 - 2023-24 season) 11/19/2021   Medicare Annual Wellness Visit  03/30/2022   Zoster (Shingles) Vaccine (1 of 2) 07/05/2022*   Hemoglobin A1C  09/26/2022   Complete foot exam   12/29/2022   Yearly kidney function blood test for diabetes  03/29/2023   DTaP/Tdap/Td vaccine (2 - Td or Tdap) 09/16/2026   Colon Cancer Screening  02/04/2027   Pneumonia Vaccine  Completed   Flu Shot  Completed   DEXA scan (bone density measurement)  Completed   Hepatitis C Screening: USPSTF Recommendation to screen - Ages 56-79 yo.  Completed   HPV Vaccine  Aged Out  *Topic was postponed. The date shown is not the original due date.

## 2022-04-14 ENCOUNTER — Ambulatory Visit (HOSPITAL_COMMUNITY): Admission: RE | Admit: 2022-04-14 | Payer: Medicare HMO | Source: Ambulatory Visit

## 2022-04-21 NOTE — Progress Notes (Signed)
Cardiology Clinic Note   Patient Name: Leslie [name] Date of Encounter: 04/22/2022  Primary Care Provider:  [name], [place] Primary Cardiologist:  Rozann Lesches, MD  Patient Profile    77 year old female with a history of HOCM, essential hypertension, PAD, tobacco abuse, hyperlipidemia, LVH, renal artery stenosis, carotid artery stenosis, chronic diastolic congestive heart failure, COPD, who is here today for follow-up.  Past Medical History    Past Medical History:  Diagnosis Date   Back pain    DDD   Carotid artery occlusion    Cough    Diastolic heart failure (HCC)    Dry skin    Essential hypertension    History of bruising easily    History of colon polyps    History of migraine    History of pneumonia    Hyperlipidemia    PAD (peripheral artery disease) (HCC)    Panic attack    Rheumatoid arthritis(714.0)    Past Surgical History:  Procedure Laterality Date   ABDOMINAL HYSTERECTOMY     ANGIOPLASTY  04/20/2012   Procedure: ANGIOPLASTY;  Surgeon: Serafina Mitchell, MD;  Location: [place];  Service: Vascular;  Laterality: Right;  Using 1cm x6 cm vascu-guard patch    [facility]  01/31/2005   CAROTID ANGIOGRAM N/A 01/06/2012   Procedure: CAROTID ANGIOGRAM;  Surgeon: Lorretta Harp, MD;  Location: [facility] CATH LAB;  Service: Cardiovascular;  Laterality: N/A;   CARPAL TUNNEL RELEASE     CHOLECYSTECTOMY     COLONOSCOPY     CORONARY ANGIOPLASTY  01/06/12/2005/2006    2 stents   EGD/TCS  03/2004   Dr. Gala Romney: Tiny distal esophageal erosions, prepyloric antral erosions/ulcerations, internal hemorrhoids, diminutive polyp in the rectosigmoid junction removed (hyperplastic).  Pathology not available but reportedly she had H. pylori which was treated.   ENDARTERECTOMY  04/20/2012   Procedure: ENDARTERECTOMY CAROTID;  Surgeon: Serafina Mitchell, MD;  Location: [place];  Service: Vascular;  Laterality: Right;   ESOPHAGOGASTRODUODENOSCOPY  07/2004    Dr. Gala Romney: Previous antral ulcer was completely healed.  EGD normal.   GIVENS CAPSULE STUDY     ???   leg stents     LOWER EXTREMITY ANGIOGRAM N/A 01/06/2012   Procedure: LOWER EXTREMITY ANGIOGRAM;  Surgeon: Lorretta Harp, MD;  Location: [facility] CATH LAB;  Service: Cardiovascular;  Laterality: N/A;   TOTAL KNEE ARTHROPLASTY     TRIGGER FINGER RELEASE     ulnar nerve surgery      Allergies  Allergies  Allergen Reactions   Latex Other (See Comments)    Burns skin    Tape Other (See Comments) and Itching    Burns skin    Atorvastatin Other (See Comments)    myalgia myalgia    History of Present Illness    Leslie [name] is a 77 year old female with previously mentioned past medical history of HOCM on echocardiogram was completed [facility] in 2018 in 2020 which revealed normal LVEF, severe LVH no resting LVOT gradient, essential hypertension, PAD with known occlusion of the right superficial femoral artery stent, tobacco abuse, hyperlipidemia, LVH, renal artery stenosis, carotid artery stenosis with prior right CEA in 4098, chronic diastolic congestive heart failure, and COPD with emphysema and chronic respiratory failure.  Echocardiogram completed 02/2020 revealed LVEF of 70-75%, no regional wall motion abnormalities, G1 DD, LV contraction is vigorous with near cavity obliteration, LV outflow tract without evidence of obstruction at rest, left atrial size is  moderately dilated, mild to moderate mitral regurgitation, moderate tricuspid valve regurgitation, severe pulmonary hypertension with RVSP at 77 mmHg.  She was last seen in clinic 12/15/2020 by Ermalinda Barrios, PA when she was having fluctuating blood pressures, occasional edema, continued smoking, and was looking for a new primary care provider at that time.  There were no medication changes that were made and no further testing that was ordered.  She returns to clinic today stating that overall she has been feeling fairly well.  She does  endorse worsening shortness of breath and peripheral edema over the last 2 to 3 months.  She states that she has followed up with several different doctors and no one has been able to assist her with the swelling she has been having in her legs.  She says they are painful and tender to the touch due to the amount of stretching of the skin to the bilateral lower extremities that she has been having as well as occasional weeping of various areas due to the peripheral edema that she has.  On arrival today her oxygen saturation was noted to be 85% she stated in the past she has been advised that she needed oxygen but she requested a portable tank or concentrator versus the larger tanks and was advised that they were no longer available so she has yet to start any oxygen therapy on her own.  She stated she had contemplated going to the emergency department several times due to the shortness of breath and edema but has not as of yet.  Home Medications    No current facility-administered medications for this visit.   Current Outpatient Medications  Medication Sig Dispense Refill   albuterol (PROVENTIL) (2.5 MG/3ML) 0.083% nebulizer solution Take 3 mLs (2.5 mg total) by nebulization every 6 (six) hours as needed for wheezing or shortness of breath. 75 mL 12   albuterol (VENTOLIN HFA) 108 (90 Base) MCG/ACT inhaler Inhale 2 puffs into the lungs every 6 (six) hours as needed for wheezing or shortness of breath. INHALE 2 INHALATIONS INTO THE LUNGS EVERY 6 HOURS AS NEEDED FOR WHEEZING Strength: 108 (90 Base) MCG/ACT 54 g 6   Artificial Tears ophthalmic solution Place 2 drops into both eyes as needed. 15 mL 12   aspirin EC 81 MG tablet Take 81 mg by mouth daily.     cilostazol (PLETAL) 100 MG tablet TAKE (1) TABLET BY MOUTH TWICE DAILY BEFORE MEALS. 60 tablet 0   furosemide (LASIX) 20 MG tablet Take 1 tablet (20 mg total) by mouth daily as needed. 90 tablet 1   ibuprofen (ADVIL) 800 MG tablet TAKE 1 TABLET EVERY DAY  AS NEEDED FOR MODERATE PAIN. 30 tablet 0   losartan (COZAAR) 50 MG tablet TAKE (1) TABLET BY MOUTH IN THE MORNING AND AT BEDTIME 180 tablet 0   metoprolol succinate (TOPROL-XL) 25 MG 24 hr tablet Take 1 tablet (25 mg total) by mouth daily. 90 tablet 3   rosuvastatin (CRESTOR) 10 MG tablet TAKE 1 TABLET BY MOUTH ONCE A DAY. 90 tablet 2   SYMBICORT 80-4.5 MCG/ACT inhaler INHALE 2 PUFFS TWICE DAILY 3 each 2   Cholecalciferol (VITAMIN D3) 25 MCG (1000 UT) CAPS Take 1 capsule (1,000 Units total) by mouth daily. (Patient not taking: Reported on 04/22/2022) 60 capsule 3   metFORMIN (GLUCOPHAGE) 500 MG tablet Take 1 tablet (500 mg total) by mouth daily at 2 PM. (Patient not taking: Reported on 04/22/2022) 90 tablet 1   pantoprazole (PROTONIX) 40 MG  tablet Take 1 tablet (40 mg total) by mouth daily. (Patient not taking: Reported on 04/22/2022) 90 tablet 0   Facility-Administered Medications Ordered in Other Visits  Medication Dose Route Frequency Provider Last Rate Last Admin   albuterol (VENTOLIN HFA) 108 (90 Base) MCG/ACT inhaler 2 puff  2 puff Inhalation Q2H PRN Fredia Sorrow, MD         Family History    Family History  Problem Relation Age of Onset   Hyperlipidemia Mother    Hypertension Mother    Cancer Father        Brain   Diabetes Sister    Hypertension Sister    Diabetes Brother    Heart disease Brother    Hypertension Brother    Heart attack Brother    Colon cancer Neg Hx    She indicated that her mother is deceased. She indicated that her father is deceased. She indicated that the status of her sister is unknown. She indicated that the status of her brother is unknown. She indicated that both of her daughters are alive. She indicated that the status of her neg hx is unknown.  Social History    Social History   Socioeconomic History   Marital status: Divorced    Spouse name: Not on file   Number of children: 2   Years of education: 12   Highest education level: Not on file   Occupational History    Comment: not working currently   Occupation: retired  Tobacco Use   Smoking status: Every Day    Packs/day: 0.50    Years: 50.00    Total pack years: 25.00    Types: Cigarettes    Start date: 1961   Smokeless tobacco: Never   Tobacco comments:    Smoke 1/2 pack per day 09/04/20. Not willing to quit now.  Vaping Use   Vaping Use: Never used  Substance and Sexual Activity   Alcohol use: Not Currently   Drug use: No   Sexual activity: Yes  Other Topics Concern   Not on file  Social History Narrative   Divorced.Lives alone.Works at [employer].   Social Determinants of Health   Financial Resource Strain: Medium Risk (03/30/2021)   Overall Financial Resource Strain (CARDIA)    Difficulty of Paying Living Expenses: Somewhat hard  Food Insecurity: No Food Insecurity (03/30/2021)   Hunger Vital Sign    Worried About Running Out of Food in the Last Year: Never true    Ran Out of Food in the Last Year: Never true  Transportation Needs: No Transportation Needs (03/30/2021)   PRAPARE - Hydrologist (Medical): No    Lack of Transportation (Non-Medical): No  Physical Activity: Inactive (03/30/2021)   Exercise Vital Sign    Days of Exercise per Week: 0 days    Minutes of Exercise per Session: 0 min  Stress: No Stress Concern Present (03/30/2021)   [place]    Feeling of Stress : Not at all  Social Connections: Moderately Integrated (03/30/2021)   Social Connection and Isolation Panel [NHANES]    Frequency of Communication with Friends and Family: More than three times a week    Frequency of Social Gatherings with Friends and Family: Three times a week    Attends Religious Services: More than 4 times per year    Active Member of Clubs or Organizations: Yes    Attends Archivist Meetings: Never  Marital Status: Divorced  Human resources officer Violence: Not At  Risk (03/30/2021)   Humiliation, Afraid, Rape, and Kick questionnaire    Fear of Current or Ex-Partner: No    Emotionally Abused: No    Physically Abused: No    Sexually Abused: No     Review of Systems    General:  No chills, fever, night sweats or weight changes.  Endorses fatigue Cardiovascular:  No chest pain, endorses dyspnea on exertion, edema, orthopnea, but denies palpitations, paroxysmal nocturnal dyspnea. Dermatological: No rash, lesions/masses Respiratory: No cough, endorses dyspnea Urologic: No hematuria, dysuria Abdominal:   No nausea, vomiting, diarrhea, bright red blood per rectum, melena, or hematemesis Neurologic:  No visual changes, wkns, changes in mental status.  Endorses generalized weakness All other systems reviewed and are otherwise negative except as noted above.   Physical Exam    VS:  BP 126/66   Pulse 95   Ht 5' 6.5" (1.689 m)   Wt 153 lb (69.4 kg)   SpO2 (!) 85%   BMI 24.32 kg/m  , BMI Body mass index is 24.32 kg/m.     GEN: Well nourished, well developed, in no acute distress. HEENT: normal. Neck: Supple, + JVD, carotid bruits, or masses. Cardiac: RRR, II/VI murmur best heard RSB, without rubs, or gallops. No clubbing, cyanosis, 2-3+ pitting edema from feet to mid thigh.  Radials 2+/PT 2+ and equal bilaterally.  Respiratory:  Respirations regular and unlabored, coarse upper with minimal air movement in the left lower and scattered rhonchi with occasional expiratory wheezes noted throughout to auscultation bilaterally.  Oxygen saturation 85% on room air patient currently denying oxygen therapy GI: Soft, nontender, nondistended, BS + x 4. MS: no deformity or atrophy. Skin: warm and dry, no rash. Neuro:  Strength and sensation are intact. Psych: Normal affect.  Accessory Clinical Findings    ECG personally reviewed by me today-sinus rhythm with a rate of 95 incomplete right bundle branch block, right ventricular hypertrophy  Lab Results   Component Value Date   WBC 3.9 03/28/2022   HGB 13.7 03/28/2022   HCT 44.0 03/28/2022   MCV 85 03/28/2022   PLT 227 03/28/2022   Lab Results  Component Value Date   CREATININE 0.95 03/28/2022   BUN 10 03/28/2022   NA 142 03/28/2022   K 4.3 03/28/2022   CL 97 03/28/2022   CO2 26 03/28/2022   Lab Results  Component Value Date   ALT 23 03/28/2022   AST 33 03/28/2022   ALKPHOS 92 03/28/2022   BILITOT 2.6 (H) 03/28/2022   Lab Results  Component Value Date   CHOL 139 03/28/2022   HDL 41 03/28/2022   LDLCALC 81 03/28/2022   TRIG 86 03/28/2022   CHOLHDL 3.4 03/28/2022    Lab Results  Component Value Date   HGBA1C 6.8 (H) 03/28/2022    Assessment & Plan   1.  Shortness of breath with hypoxia.  Patient oxygen saturation noted to be 85%.  Patient refuses any oxygen therapy at this time.  She stated she was proceeding for CT of her chest but she had to reschedule.  No breath sounds heard in the left lower lobe.  Patient encouraged/advised to be further evaluated and treated in the emergency department.  At this time she states that she has to go and pay her rent and then she will report to the emergency department for further workup.  2.  Hypertrophic cardiomyopathy based on echo with no substantial resting LVOT gradient by most  recent study in December with severe LVH and mild diastolic dysfunction.  She is currently on losartan 50 mg twice daily and Toprol-XL 25 mg daily.  She has continued taking furosemide she is scheduled 20 mg and has been taking 40 mg and stopped taking her potassium supplements due to difficulty swallowing large tablets.  She is due for an echocardiogram if not completed during her hospital visit she will need 1 scheduled on her return visit to clinic.  3.  PAD and CAD had previously been following with vascular.  She has been continued on cilostazol 100 mg twice daily.  4.  Mixed hyperlipidemia with LDL of 81 she is continued on rosuvastatin.  5.   Essential hypertension blood pressure today 126/66.  Remained stable.  She has been continued on her current medication regimen.  6.  Disposition patient to follow-up in clinic with MD/APP after evaluation in the emergency department for worsening shortness of breath, and pitting peripheral edema from feet to mid thigh.  Unfortunately we tried to take the patient to the emergency department for his symptoms for evaluation and she declined stating that she would arrive to the emergency department later this evening that she had to go and pay her rent prior to.  Further review revealed she did arrive in the emergency department and was triaged at 1517.  Merrillyn Ackerley, NP 04/22/2022, 4:02 PM

## 2022-04-22 ENCOUNTER — Encounter: Payer: Self-pay | Admitting: Cardiology

## 2022-04-22 ENCOUNTER — Encounter (HOSPITAL_COMMUNITY): Payer: Self-pay | Admitting: Emergency Medicine

## 2022-04-22 ENCOUNTER — Inpatient Hospital Stay (HOSPITAL_COMMUNITY)
Admission: EM | Admit: 2022-04-22 | Discharge: 2022-05-05 | DRG: 291 | Disposition: A | Discharge status: Home / Self Care | Payer: Medicare HMO | Attending: Family Medicine | Admitting: Family Medicine

## 2022-04-22 ENCOUNTER — Ambulatory Visit: Payer: Medicare HMO | Attending: Cardiology | Admitting: Cardiology

## 2022-04-22 ENCOUNTER — Other Ambulatory Visit: Payer: Self-pay

## 2022-04-22 ENCOUNTER — Emergency Department (HOSPITAL_COMMUNITY): Payer: Medicare HMO

## 2022-04-22 VITALS — BP 126/66 | HR 95 | Ht 66.5 in | Wt 153.0 lb

## 2022-04-22 DIAGNOSIS — I422 Other hypertrophic cardiomyopathy: Secondary | ICD-10-CM | POA: Diagnosis present

## 2022-04-22 DIAGNOSIS — Z1152 Encounter for screening for COVID-19: Secondary | ICD-10-CM

## 2022-04-22 DIAGNOSIS — Z888 Allergy status to other drugs, medicaments and biological substances status: Secondary | ICD-10-CM | POA: Diagnosis not present

## 2022-04-22 DIAGNOSIS — E1169 Type 2 diabetes mellitus with other specified complication: Secondary | ICD-10-CM | POA: Diagnosis not present

## 2022-04-22 DIAGNOSIS — I10 Essential (primary) hypertension: Secondary | ICD-10-CM

## 2022-04-22 DIAGNOSIS — R579 Shock, unspecified: Secondary | ICD-10-CM | POA: Diagnosis not present

## 2022-04-22 DIAGNOSIS — F17210 Nicotine dependence, cigarettes, uncomplicated: Secondary | ICD-10-CM | POA: Diagnosis present

## 2022-04-22 DIAGNOSIS — I5033 Acute on chronic diastolic (congestive) heart failure: Secondary | ICD-10-CM | POA: Diagnosis not present

## 2022-04-22 DIAGNOSIS — Z91040 Latex allergy status: Secondary | ICD-10-CM

## 2022-04-22 DIAGNOSIS — I421 Obstructive hypertrophic cardiomyopathy: Secondary | ICD-10-CM | POA: Diagnosis present

## 2022-04-22 DIAGNOSIS — R0902 Hypoxemia: Secondary | ICD-10-CM | POA: Diagnosis not present

## 2022-04-22 DIAGNOSIS — J9601 Acute respiratory failure with hypoxia: Secondary | ICD-10-CM | POA: Diagnosis not present

## 2022-04-22 DIAGNOSIS — I6523 Occlusion and stenosis of bilateral carotid arteries: Secondary | ICD-10-CM | POA: Diagnosis not present

## 2022-04-22 DIAGNOSIS — J449 Chronic obstructive pulmonary disease, unspecified: Secondary | ICD-10-CM | POA: Diagnosis present

## 2022-04-22 DIAGNOSIS — J811 Chronic pulmonary edema: Secondary | ICD-10-CM | POA: Diagnosis not present

## 2022-04-22 DIAGNOSIS — Z79899 Other long term (current) drug therapy: Secondary | ICD-10-CM

## 2022-04-22 DIAGNOSIS — Z7951 Long term (current) use of inhaled steroids: Secondary | ICD-10-CM | POA: Diagnosis not present

## 2022-04-22 DIAGNOSIS — E1151 Type 2 diabetes mellitus with diabetic peripheral angiopathy without gangrene: Secondary | ICD-10-CM | POA: Diagnosis present

## 2022-04-22 DIAGNOSIS — E785 Hyperlipidemia, unspecified: Secondary | ICD-10-CM

## 2022-04-22 DIAGNOSIS — Z7982 Long term (current) use of aspirin: Secondary | ICD-10-CM | POA: Diagnosis not present

## 2022-04-22 DIAGNOSIS — I509 Heart failure, unspecified: Secondary | ICD-10-CM | POA: Diagnosis not present

## 2022-04-22 DIAGNOSIS — I272 Pulmonary hypertension, unspecified: Secondary | ICD-10-CM | POA: Diagnosis not present

## 2022-04-22 DIAGNOSIS — Z7902 Long term (current) use of antithrombotics/antiplatelets: Secondary | ICD-10-CM | POA: Diagnosis not present

## 2022-04-22 DIAGNOSIS — R0602 Shortness of breath: Secondary | ICD-10-CM | POA: Diagnosis not present

## 2022-04-22 DIAGNOSIS — Z7984 Long term (current) use of oral hypoglycemic drugs: Secondary | ICD-10-CM

## 2022-04-22 DIAGNOSIS — I739 Peripheral vascular disease, unspecified: Secondary | ICD-10-CM | POA: Diagnosis not present

## 2022-04-22 DIAGNOSIS — Z83438 Family history of other disorder of lipoprotein metabolism and other lipidemia: Secondary | ICD-10-CM

## 2022-04-22 DIAGNOSIS — I071 Rheumatic tricuspid insufficiency: Secondary | ICD-10-CM | POA: Diagnosis present

## 2022-04-22 DIAGNOSIS — M069 Rheumatoid arthritis, unspecified: Secondary | ICD-10-CM | POA: Diagnosis present

## 2022-04-22 DIAGNOSIS — E876 Hypokalemia: Secondary | ICD-10-CM | POA: Diagnosis present

## 2022-04-22 DIAGNOSIS — Z8249 Family history of ischemic heart disease and other diseases of the circulatory system: Secondary | ICD-10-CM

## 2022-04-22 DIAGNOSIS — I110 Hypertensive heart disease with heart failure: Principal | ICD-10-CM | POA: Diagnosis present

## 2022-04-22 DIAGNOSIS — I2510 Atherosclerotic heart disease of native coronary artery without angina pectoris: Secondary | ICD-10-CM | POA: Diagnosis present

## 2022-04-22 DIAGNOSIS — Z833 Family history of diabetes mellitus: Secondary | ICD-10-CM

## 2022-04-22 DIAGNOSIS — Z9862 Peripheral vascular angioplasty status: Secondary | ICD-10-CM

## 2022-04-22 DIAGNOSIS — R0600 Dyspnea, unspecified: Secondary | ICD-10-CM | POA: Diagnosis not present

## 2022-04-22 DIAGNOSIS — Z96659 Presence of unspecified artificial knee joint: Secondary | ICD-10-CM | POA: Diagnosis present

## 2022-04-22 DIAGNOSIS — N179 Acute kidney failure, unspecified: Secondary | ICD-10-CM | POA: Diagnosis not present

## 2022-04-22 DIAGNOSIS — Z9889 Other specified postprocedural states: Secondary | ICD-10-CM | POA: Diagnosis not present

## 2022-04-22 DIAGNOSIS — R600 Localized edema: Secondary | ICD-10-CM

## 2022-04-22 DIAGNOSIS — Z809 Family history of malignant neoplasm, unspecified: Secondary | ICD-10-CM

## 2022-04-22 DIAGNOSIS — E119 Type 2 diabetes mellitus without complications: Secondary | ICD-10-CM

## 2022-04-22 DIAGNOSIS — J90 Pleural effusion, not elsewhere classified: Secondary | ICD-10-CM | POA: Diagnosis not present

## 2022-04-22 LAB — BASIC METABOLIC PANEL
Anion gap: 12 (ref 5–15)
BUN: 20 mg/dL (ref 8–23)
CO2: 31 mmol/L (ref 22–32)
Calcium: 8.9 mg/dL (ref 8.9–10.3)
Chloride: 94 mmol/L — ABNORMAL LOW (ref 98–111)
Creatinine, Ser: 1.04 mg/dL — ABNORMAL HIGH (ref 0.44–1.00)
GFR, Estimated: 56 mL/min — ABNORMAL LOW (ref >60)
Glucose, Bld: 98 mg/dL (ref 70–99)
Potassium: 3.3 mmol/L — ABNORMAL LOW (ref 3.5–5.1)
Sodium: 137 mmol/L (ref 135–145)

## 2022-04-22 LAB — CBC WITH DIFFERENTIAL/PLATELET
Abs Immature Granulocytes: 0.02 10*3/uL (ref 0.00–0.07)
Basophils Absolute: 0 10*3/uL (ref 0.0–0.1)
Basophils Relative: 1 %
Eosinophils Absolute: 0 10*3/uL (ref 0.0–0.5)
Eosinophils Relative: 1 %
HCT: 42.7 % (ref 36.0–46.0)
Hemoglobin: 13.2 g/dL (ref 12.0–15.0)
Immature Granulocytes: 1 %
Lymphocytes Relative: 26 %
Lymphs Abs: 1.1 10*3/uL (ref 0.7–4.0)
MCH: 26.9 pg (ref 26.0–34.0)
MCHC: 30.9 g/dL (ref 30.0–36.0)
MCV: 87.1 fL (ref 80.0–100.0)
Monocytes Absolute: 0.7 10*3/uL (ref 0.1–1.0)
Monocytes Relative: 16 %
Neutro Abs: 2.4 10*3/uL (ref 1.7–7.7)
Neutrophils Relative %: 55 %
Platelets: 205 10*3/uL (ref 150–400)
RBC: 4.9 MIL/uL (ref 3.87–5.11)
RDW: 18.6 % — ABNORMAL HIGH (ref 11.5–15.5)
WBC: 4.2 10*3/uL (ref 4.0–10.5)
nRBC: 0 % (ref 0.0–0.2)

## 2022-04-22 LAB — BRAIN NATRIURETIC PEPTIDE: B Natriuretic Peptide: 2938 pg/mL — ABNORMAL HIGH (ref 0.0–100.0)

## 2022-04-22 LAB — GLUCOSE, CAPILLARY: Glucose-Capillary: 125 mg/dL — ABNORMAL HIGH (ref 70–99)

## 2022-04-22 MED ORDER — ALBUTEROL SULFATE (2.5 MG/3ML) 0.083% IN NEBU
2.5000 mg | INHALATION_SOLUTION | RESPIRATORY_TRACT | Status: DC | PRN
  Administered 2022-04-23 – 2022-05-01 (×17): 2.5 mg via RESPIRATORY_TRACT
  Filled 2022-04-22 (×17): qty 3

## 2022-04-22 MED ORDER — INSULIN ASPART 100 UNIT/ML IJ SOLN: 0.0000 [IU] | Freq: Every day | INTRAMUSCULAR | Status: DC

## 2022-04-22 MED ORDER — ROSUVASTATIN CALCIUM 10 MG PO TABS
10.0000 mg | ORAL_TABLET | Freq: Every day | ORAL | Status: DC
  Administered 2022-04-23 – 2022-05-05 (×13): 10 mg via ORAL
  Filled 2022-04-22 (×13): qty 1

## 2022-04-22 MED ORDER — NICOTINE 14 MG/24HR TD PT24
14.0000 mg | MEDICATED_PATCH | Freq: Every day | TRANSDERMAL | Status: DC
  Administered 2022-04-22 – 2022-05-02 (×11): 14 mg via TRANSDERMAL
  Filled 2022-04-22 (×13): qty 1

## 2022-04-22 MED ORDER — FUROSEMIDE 10 MG/ML IJ SOLN
80.0000 mg | Freq: Once | INTRAMUSCULAR | Status: AC
  Administered 2022-04-22: 80 mg via INTRAVENOUS

## 2022-04-22 MED ORDER — POLYVINYL ALCOHOL 1.4 % OP SOLN: 2.0000 [drp] | OPHTHALMIC | Status: DC | PRN

## 2022-04-22 MED ORDER — ACETAMINOPHEN 650 MG RE SUPP: 650.0000 mg | Freq: Four times a day (QID) | RECTAL | Status: DC | PRN

## 2022-04-22 MED ORDER — METOPROLOL SUCCINATE ER 25 MG PO TB24
25.0000 mg | ORAL_TABLET | Freq: Every day | ORAL | Status: DC
  Administered 2022-04-23 – 2022-05-05 (×10): 25 mg via ORAL
  Filled 2022-04-22 (×13): qty 1

## 2022-04-22 MED ORDER — ASPIRIN 81 MG PO TBEC
81.0000 mg | DELAYED_RELEASE_TABLET | Freq: Every day | ORAL | Status: DC
  Administered 2022-04-23 – 2022-05-05 (×13): 81 mg via ORAL
  Filled 2022-04-22 (×13): qty 1

## 2022-04-22 MED ORDER — HYDRALAZINE HCL 20 MG/ML IJ SOLN: 5.0000 mg | INTRAMUSCULAR | Status: DC | PRN

## 2022-04-22 MED ORDER — POTASSIUM CHLORIDE CRYS ER 20 MEQ PO TBCR: 40.0000 meq | EXTENDED_RELEASE_TABLET | Freq: Four times a day (QID) | ORAL | Status: DC

## 2022-04-22 MED ORDER — CILOSTAZOL 100 MG PO TABS
100.0000 mg | ORAL_TABLET | Freq: Two times a day (BID) | ORAL | Status: DC
  Administered 2022-04-22 – 2022-05-05 (×26): 100 mg via ORAL
  Filled 2022-04-22 (×26): qty 1

## 2022-04-22 MED ORDER — ENOXAPARIN SODIUM 40 MG/0.4ML IJ SOSY
40.0000 mg | PREFILLED_SYRINGE | INTRAMUSCULAR | Status: DC
  Administered 2022-04-24 – 2022-04-30 (×7): 40 mg via SUBCUTANEOUS
  Filled 2022-04-22 (×10): qty 0.4

## 2022-04-22 MED ORDER — INSULIN ASPART 100 UNIT/ML IJ SOLN: 0.0000 [IU] | Freq: Three times a day (TID) | INTRAMUSCULAR | Status: DC

## 2022-04-22 MED ORDER — LOSARTAN POTASSIUM 50 MG PO TABS
50.0000 mg | ORAL_TABLET | Freq: Two times a day (BID) | ORAL | Status: DC
  Administered 2022-04-23 – 2022-04-26 (×5): 50 mg via ORAL
  Filled 2022-04-22 (×8): qty 1

## 2022-04-22 MED ORDER — ALBUTEROL SULFATE HFA 108 (90 BASE) MCG/ACT IN AERS
2.0000 | INHALATION_SPRAY | RESPIRATORY_TRACT | Status: DC | PRN
  Administered 2022-04-22: 2 via RESPIRATORY_TRACT
  Filled 2022-04-22: qty 6.7

## 2022-04-22 MED ORDER — POTASSIUM CHLORIDE CRYS ER 20 MEQ PO TBCR
40.0000 meq | EXTENDED_RELEASE_TABLET | ORAL | Status: AC
  Administered 2022-04-22 – 2022-04-23 (×2): 40 meq via ORAL
  Filled 2022-04-22 (×2): qty 2

## 2022-04-22 MED ORDER — ACETAMINOPHEN 325 MG PO TABS
650.0000 mg | ORAL_TABLET | Freq: Four times a day (QID) | ORAL | Status: DC | PRN
  Administered 2022-04-24 – 2022-05-03 (×9): 650 mg via ORAL
  Filled 2022-04-22 (×10): qty 2

## 2022-04-22 MED ORDER — FUROSEMIDE 10 MG/ML IJ SOLN
80.0000 mg | Freq: Two times a day (BID) | INTRAMUSCULAR | Status: DC
  Administered 2022-04-23: 80 mg via INTRAVENOUS
  Filled 2022-04-22 (×2): qty 8

## 2022-04-22 MED ORDER — MOMETASONE FURO-FORMOTEROL FUM 100-5 MCG/ACT IN AERO
2.0000 | INHALATION_SPRAY | Freq: Two times a day (BID) | RESPIRATORY_TRACT | Status: DC
  Administered 2022-04-22 – 2022-05-05 (×26): 2 via RESPIRATORY_TRACT
  Filled 2022-04-22: qty 8.8

## 2022-04-22 NOTE — ED Triage Notes (Signed)
Pt via POV c/o intermittent SOB and bilateral pitting edema in legs x 3 months. Noted dyspnea on exertion with O2 sat 84% on room air. Placed on 2L nasal cannula in triage and increased to 92%. Pt reports she does not use baseline oxygen at home and her normal O2 saturation is below 90%.

## 2022-04-22 NOTE — Patient Instructions (Addendum)
You have been advised to got to the Emergency Room NOW for your severe shortness of breath and leg swelling.   PLEASE reconsider leaving our office

## 2022-04-22 NOTE — H&P (Signed)
TRH H&P   Patient Demographics:    Leslie [name], is a 77 y.o. female  MRN: [id number]   DOB - 1945/04/25  Admit Date - 04/22/2022  Outpatient Primary MD for the patient is [name], [place]  Referring MD/NP/PA: PA [place]  Outpatient Specialists: [facility] cardiology    Patient coming from: cardiology office  Chief Complaint  Patient presents with   Shortness of Breath      HPI:    Leslie [name]  is a 77 y.o. female, with medical history significant for hypertension, dyslipidemia, COPD with ongoing tobacco use, PAD/carotid artery disease, and hypertrophic cardiomyopathy with established chronic diastolic CHF  -Patient presents to ED secondary to complaints of shortness of breath, she does report progressive dyspnea and lower extremity edema over the last 50-month and admission 2021 for acute on chronic diastolic CHF, she reports she is compliant with her medications, she denies any increased salt intake, she is supposed to be on Lasix 20 mg oral daily but she herself increased to 40 mg oral daily, she denies fever, chills, chest pain, nausea or vomiting, she was seen by cardiology office today where she was noted to be hypoxic, so she was instructed to come to ED for further evaluation, she does not wear oxygen at home -In ED she was hypoxic 84%, and at 1 point she did drop to 79% on room air, currently requiring 2 L nasal cannula workup significant for elevated BNP at 2990, chest x-ray significant for cardiomegaly, vascular congestion and pulmonary edema with right pleural effusion, she received IV Lasix, and [facility] hospitalist consulted to admit.  Review of systems:   A full 10 point Review of Systems was done, except as stated above, all other Review of Systems were negative.   With Past History of the following :    Past Medical History:  Diagnosis Date   Back pain    DDD   Carotid  artery occlusion    Cough    Diastolic heart failure (HCC)    Dry skin    Essential hypertension    History of bruising easily    History of colon polyps    History of migraine    History of pneumonia    Hyperlipidemia    PAD (peripheral artery disease) (HCC)    Panic attack    Rheumatoid arthritis(714.0)       Past Surgical History:  Procedure Laterality Date   ABDOMINAL HYSTERECTOMY     ANGIOPLASTY  04/20/2012   Procedure: ANGIOPLASTY;  Surgeon: VSerafina Mitchell MD;  [place]  Service: Vascular;  Laterality: Right;  Using 1cm x6 cm vascu-guard patch    [place] 01/31/2005   CAROTID ANGIOGRAM N/A 01/06/2012   Procedure: CAROTID ANGIOGRAM;  Surgeon: JLorretta Harp MD;  [facility];  Service: Cardiovascular;  Laterality: N/A;   CARPAL  TUNNEL RELEASE     CHOLECYSTECTOMY     COLONOSCOPY     CORONARY ANGIOPLASTY  01/06/12/2005/2006    2 stents   EGD/TCS  03/2004   Dr. Gala Romney: Tiny distal esophageal erosions, prepyloric antral erosions/ulcerations, internal hemorrhoids, diminutive polyp in the rectosigmoid junction removed (hyperplastic).  Pathology not available but reportedly she had H. pylori which was treated.   ENDARTERECTOMY  04/20/2012   Procedure: ENDARTERECTOMY CAROTID;  Surgeon: Serafina Mitchell, MD;  Location: [place];  Service: Vascular;  Laterality: Right;   ESOPHAGOGASTRODUODENOSCOPY  07/2004   Dr. Gala Romney: Previous antral ulcer was completely healed.  EGD normal.   GIVENS CAPSULE STUDY     ???   leg stents     LOWER EXTREMITY ANGIOGRAM N/A 01/06/2012   Procedure: LOWER EXTREMITY ANGIOGRAM;  Surgeon: Lorretta Harp, MD;  Location: [facility] C[facility] LAB;  Service: Cardiovascular;  Laterality: N/A;   TOTAL KNEE ARTHROPLASTY     TRIGGER FINGER RELEASE     ulnar nerve surgery        Social History:     Social History   Tobacco Use   Smoking status: Every Day    Packs/day: 0.50    Years: 50.00    Total pack years: 25.00     Types: Cigarettes    Start date: 1961   Smokeless tobacco: Never   Tobacco comments:    Smoke 1/2 pack per day 09/04/20. Not willing to quit now.  Substance Use Topics   Alcohol use: Not Currently      Family History :     Family History  Problem Relation Age of Onset   Hyperlipidemia Mother    Hypertension Mother    Cancer Father        Brain   Diabetes Sister    Hypertension Sister    Diabetes Brother    Heart disease Brother    Hypertension Brother    Heart attack Brother    Colon cancer Neg Hx      Home Medications:   Prior to Admission medications   Medication Sig Start Date End Date Taking? Authorizing Provider  albuterol (PROVENTIL) (2.5 MG/3ML) 0.083% nebulizer solution Take 3 mLs (2.5 mg total) by nebulization every 6 (six) hours as needed for wheezing or shortness of breath. 04/11/22  Yes Lyndal Pulley, MD  albuterol (VENTOLIN HFA) 108 (90 Base) MCG/ACT inhaler Inhale 2 puffs into the lungs every 6 (six) hours as needed for wheezing or shortness of breath. INHALE 2 INHALATIONS INTO THE LUNGS EVERY 6 HOURS AS NEEDED FOR WHEEZING Strength: 108 (90 Base) MCG/ACT 11/17/21  Yes Paseda, Lillie Columbia R, FNP  Artificial Tears ophthalmic solution Place 2 drops into both eyes as needed. 04/05/22 04/05/23 Yes [name], FNP  aspirin EC 81 MG tablet Take 81 mg by mouth daily.   Yes [provider]  cilostazol (PLETAL) 100 MG tablet TAKE (1) TABLET BY MOUTH TWICE DAILY BEFORE MEALS. 02/04/22   Serafina Mitchell, MD  furosemide (LASIX) 20 MG tablet Take 1 tablet (20 mg total) by mouth daily as needed. 04/05/22 10/02/22  [name], FNP  ibuprofen (ADVIL) 800 MG tablet TAKE 1 TABLET EVERY DAY AS NEEDED FOR MODERATE PAIN. 02/24/22   [name], FNP  losartan (COZAAR) 50 MG tablet TAKE (1) TABLET BY MOUTH IN THE MORNING AND AT BEDTIME 02/04/22   [name], FNP  metFORMIN (GLUCOPHAGE) 500 MG tablet Take 1 tablet (500 mg total) by mouth daily at 2 PM. Patient  not taking:  Reported on 04/22/2022 03/29/22   [name], FNP  metoprolol succinate (TOPROL-XL) 25 MG 24 hr tablet Take 1 tablet (25 mg total) by mouth daily. 04/30/21   Paseda, Dewaine Conger, FNP  pantoprazole (PROTONIX) 40 MG tablet Take 1 tablet (40 mg total) by mouth daily. Patient not taking: Reported on 04/22/2022 01/28/21   Renee Rival, FNP  rosuvastatin (CRESTOR) 10 MG tablet TAKE 1 TABLET BY MOUTH ONCE A DAY. 06/15/21   Imogene Burn, PA-C  SYMBICORT 80-4.5 MCG/ACT inhaler INHALE 2 PUFFS TWICE DAILY 11/15/21   Renee Rival, FNP     Allergies:     Allergies  Allergen Reactions   Latex Other (See Comments)    Burns skin    Tape Other (See Comments) and Itching    Burns skin    Atorvastatin Other (See Comments)    myalgia myalgia     Physical Exam:   Vitals  Blood pressure (!) 213/97, pulse 72, temperature (!) 97.3 F (36.3 C), temperature source Oral, resp. rate (!) 21, height 5' 6.5" (1.689 m), weight 69.4 kg, SpO2 100 %.   1. General well developed  lying in bed in NAD,    2. Normal affect and insight, Not Suicidal or Homicidal, Awake Alert, Oriented X 3.  3. No F.N deficits, ALL C.Nerves Intact, Strength 5/5 all 4 extremities, Sensation intact all 4 extremities, Plantars down going.  4. Ears and Eyes appear Normal, Conjunctivae clear, PERRLA. Moist Oral Mucosa.  5. Supple Neck, + JVD, No cervical lymphadenopathy appriciated, No Carotid Bruits.  6. Symmetrical Chest wall movement, managed air entry at the bases with crackles  7. RRR, No Gallops, Rubs or Murmurs, No Parasternal Heave.  +2 edema  8. Positive Bowel Sounds, Abdomen Soft, No tenderness, No organomegaly appriciated,No rebound -guarding or rigidity.  9.  No Cyanosis, Normal Skin Turgor, No Skin Rash or Bruise.  10. Good muscle tone,  joints appear normal , no effusions, Normal ROM.  1   Data Review:    CBC Recent Labs  Lab 04/22/22 1823  WBC 4.2  HGB 13.2  HCT 42.7  PLT 205   MCV 87.1  MCH 26.9  MCHC 30.9  RDW 18.6*  LYMPHSABS 1.1  MONOABS 0.7  EOSABS 0.0  BASOSABS 0.0   ------------------------------------------------------------------------------------------------------------------  Chemistries  Recent Labs  Lab 04/22/22 1823  NA 137  K 3.3*  CL 94*  CO2 31  GLUCOSE 98  BUN 20  CREATININE 1.04*  CALCIUM 8.9   ------------------------------------------------------------------------------------------------------------------ estimated creatinine clearance is 44.6 mL/min (A) (by C-G formula based on SCr of 1.04 mg/dL (H)). ------------------------------------------------------------------------------------------------------------------ No results for input(s): "TSH", "T4TOTAL", "T3FREE", "THYROIDAB" in the last 72 hours.  Invalid input(s): "FREET3"  Coagulation profile No results for input(s): "INR", "PROTIME" in the last 168 hours. ------------------------------------------------------------------------------------------------------------------- No results for input(s): "DDIMER" in the last 72 hours. -------------------------------------------------------------------------------------------------------------------  Cardiac Enzymes No results for input(s): "CKMB", "TROPONINI", "MYOGLOBIN" in the last 168 hours.  Invalid input(s): "CK" ------------------------------------------------------------------------------------------------------------------    Component Value Date/Time   BNP 2,938.0 (H) 04/22/2022 1823     ---------------------------------------------------------------------------------------------------------------  Urinalysis    Component Value Date/Time   COLORURINE STRAW (A) 09/18/2019 2146   APPEARANCEUR HAZY (A) 09/18/2019 2146   LABSPEC 1.004 (L) 09/18/2019 2146   PHURINE 7.0 09/18/2019 2146   GLUCOSEU NEGATIVE 09/18/2019 2146   HGBUR SMALL (A) 09/18/2019 2146   BILIRUBINUR NEGATIVE 09/18/2019 2146   KETONESUR  NEGATIVE 09/18/2019 2146   PROTEINUR NEGATIVE 09/18/2019 2146   UROBILINOGEN 0.2 04/16/2012  [place] 09/18/2019 2146   LEUKOCYTESUR NEGATIVE 09/18/2019 2146    ----------------------------------------------------------------------------------------------------------------   Imaging Results:    DG Chest 2 View  Result Date: 04/22/2022 CLINICAL DATA:  Shortness of breath. EXAM: CHEST - 2 VIEW COMPARISON:  March 09, 2020. FINDINGS: Stable cardiomegaly. Mild central pulmonary vascular congestion is noted. Probable bilateral pulmonary edema is noted. Mild to moderate right pleural effusion is noted with associated right basilar atelectasis or infiltrate. Bony thorax is unremarkable. IMPRESSION: Stable cardiomegaly with mild central pulmonary vascular congestion and probable mild bilateral pulmonary edema. Mild-to-moderate right pleural effusion is noted with associated right basilar atelectasis or infiltrate. Electronically Signed   By: Marijo Conception M.D.   On: 04/22/2022 15:53    EKG:  Vent. rate 89 BPM PR interval 150 ms QRS duration 104 ms QT/QTcB 378/459 ms P-R-T axes 75 156 96 Normal sinus rhythm Possible Left atrial enlargement Right axis deviation Incomplete right bundle branch block Right ventricular hypertrophy Septal infarct , age undetermined Abnormal ECG   Assessment & Plan:    Principal Problem:   Acute respiratory failure with hypoxia (HCC) Active Problems:   Arthritis, rheumatoid (HCC)   COPD (chronic obstructive pulmonary disease) (HCC)   Acute on chronic diastolic HF (heart failure) (HCC)   Diabetes mellitus (HCC)  Acute respiratory failure with hypoxia -Setting of acute on chronic diastolic CHF, with underlying COPD, but she does not appear to be exacerbation. -Hypoxic 84% on presentation, requiring 2 L nasal cannula -evidence of volume overload on imaging -Wean oxygen as tolerated  Acute on chronic diastolic CHF -Is admitted under CHF  pathway, continue with IV Lasix 80 mg twice daily, monitor electrolytes closely and replete as needed -Continue with daily weight -strict ins and out -Recheck 2D echo  HOCM -Pete 2D echo is pending -Continue with losartan and Toprol-XL.  Hypokalemia -repleting, monitor closely as on IV diuresis  COPD -No active wheezing, does not appear to be in exacerbation, continue with home regimen, will add as needed albuterol  PAD and CAD -Patient been following up with vascular surgery, continue with aspirin, cilostazol 100 mg twice daily, continue with statin  Diabetes mellitus -She does not take her metformin at home, will check A1c and keep on sliding scale  Hyperlipidemia -Continue with rosuvastatin  Hypertension -Blood pressure significantly elevated while in ED, should improve with IV diuresis, resume home medication as well and will add as needed hydralazine    DVT Prophylaxis Lovenox   AM Labs Ordered, also please review Full Orders  Family Communication: Admission, patients condition and plan of care including tests being ordered have been discussed with the patient  who indicate understanding and agree with the plan and Code Status.  Code Status fULL  Likely DC to  HOME  Condition GUARDED    Consults called: none    Admission status: inpatient    Time spent in minutes : 70 minutes   Phillips Climes M.D on 04/22/2022 at 8:20 PM   [facility] - Office  [phone]

## 2022-04-22 NOTE — ED Provider Notes (Signed)
[facility] Provider Note   CSN: [id number] Arrival date & time: 04/22/22  1508     History  Chief Complaint  Patient presents with   Shortness of Breath    Leslie [name] is a 77 y.o. female with a past medical history of COPD, CHF who presents emergency department with concerns for shortness of breath.  Also notes associated bilateral lower extremity swelling x 2 months.  Notes that she been compliant with her medications.  Denies chest pain, nausea, vomiting, cough, rhinorrhea, nasal congestion.  Patient was evaluated by her cardiology office today and told to come into the emergency department due to concerns for hypoxia.  Patient does not wear oxygen at home.    The history is provided by the patient. No language interpreter was used.       Home Medications Prior to Admission medications   Medication Sig Start Date End Date Taking? Authorizing Provider  albuterol (PROVENTIL) (2.5 MG/3ML) 0.083% nebulizer solution Take 3 mLs (2.5 mg total) by nebulization every 6 (six) hours as needed for wheezing or shortness of breath. 04/11/22   Lyndal Pulley, MD  albuterol (VENTOLIN HFA) 108 (90 Base) MCG/ACT inhaler Inhale 2 puffs into the lungs every 6 (six) hours as needed for wheezing or shortness of breath. INHALE 2 INHALATIONS INTO THE LUNGS EVERY 6 HOURS AS NEEDED FOR WHEEZING Strength: 108 (90 Base) MCG/ACT 11/17/21   Renee Rival, FNP  Artificial Tears ophthalmic solution Place 2 drops into both eyes as needed. 04/05/22 04/05/23  [name], FNP  aspirin EC 81 MG tablet Take 81 mg by mouth daily.    [provider]  Cholecalciferol (VITAMIN D3) 25 MCG (1000 UT) CAPS Take 1 capsule (1,000 Units total) by mouth daily. Patient not taking: Reported on 04/22/2022 08/24/21   Renee Rival, FNP  cilostazol (PLETAL) 100 MG tablet TAKE (1) TABLET BY MOUTH TWICE DAILY BEFORE MEALS. 02/04/22   Serafina Mitchell, MD  furosemide  (LASIX) 20 MG tablet Take 1 tablet (20 mg total) by mouth daily as needed. 04/05/22 10/02/22  [name], FNP  ibuprofen (ADVIL) 800 MG tablet TAKE 1 TABLET EVERY DAY AS NEEDED FOR MODERATE PAIN. 02/24/22   [name], FNP  losartan (COZAAR) 50 MG tablet TAKE (1) TABLET BY MOUTH IN THE MORNING AND AT BEDTIME 02/04/22   [name], FNP  metFORMIN (GLUCOPHAGE) 500 MG tablet Take 1 tablet (500 mg total) by mouth daily at 2 PM. Patient not taking: Reported on 04/22/2022 03/29/22   [name], FNP  metoprolol succinate (TOPROL-XL) 25 MG 24 hr tablet Take 1 tablet (25 mg total) by mouth daily. 04/30/21   Paseda, Dewaine Conger, FNP  pantoprazole (PROTONIX) 40 MG tablet Take 1 tablet (40 mg total) by mouth daily. Patient not taking: Reported on 04/22/2022 01/28/21   Renee Rival, FNP  rosuvastatin (CRESTOR) 10 MG tablet TAKE 1 TABLET BY MOUTH ONCE A DAY. 06/15/21   Imogene Burn, PA-C  SYMBICORT 80-4.5 MCG/ACT inhaler INHALE 2 PUFFS TWICE DAILY 11/15/21   Renee Rival, FNP      Allergies    Latex, Tape, and Atorvastatin    Review of Systems   Review of Systems  Respiratory:  Positive for shortness of breath.   All other systems reviewed and are negative.   Physical Exam Updated Vital Signs BP 118/70 (BP Location: Right Arm)   Pulse 75   Temp (!) 97.3 F (36.3 C) (Oral)  Resp (!) 22   Ht 5' 6.5" (1.689 m)   Wt 69.4 kg   SpO2 100%   BMI 24.32 kg/m  Physical Exam Vitals and nursing note reviewed.  Constitutional:      General: She is not in acute distress.    Appearance: She is not diaphoretic.  HENT:     Head: Normocephalic and atraumatic.     Mouth/Throat:     Pharynx: No oropharyngeal exudate.  Eyes:     General: No scleral icterus.    Conjunctiva/sclera: Conjunctivae normal.  Cardiovascular:     Rate and Rhythm: Normal rate and regular rhythm.     Pulses: Normal pulses.     Heart sounds: Normal heart sounds.  Pulmonary:     Effort: Pulmonary effort  is normal. No respiratory distress.     Breath sounds: Decreased breath sounds present. No wheezing.  Abdominal:     General: Bowel sounds are normal.     Palpations: Abdomen is soft. There is no mass.     Tenderness: There is no abdominal tenderness. There is no guarding or rebound.  Musculoskeletal:        General: Normal range of motion.     Cervical back: Normal range of motion and neck supple.     Comments: 1-2+ pitting edema noted bilaterally.   Skin:    General: Skin is warm and dry.  Neurological:     Mental Status: She is alert.  Psychiatric:        Behavior: Behavior normal.     ED Results / Procedures / Treatments   Labs (all labs ordered are listed, but only abnormal results are displayed) Labs Reviewed  BASIC METABOLIC PANEL - Abnormal; Notable for the following components:      Result Value   Potassium 3.3 (*)    Chloride 94 (*)    Creatinine, Ser 1.04 (*)    GFR, Estimated 56 (*)    All other components within normal limits  BRAIN NATRIURETIC PEPTIDE - Abnormal; Notable for the following components:   B Natriuretic Peptide 2,938.0 (*)    All other components within normal limits  CBC WITH DIFFERENTIAL/PLATELET - Abnormal; Notable for the following components:   RDW 18.6 (*)    All other components within normal limits    EKG None  Radiology DG Chest 2 View  Result Date: 04/22/2022 CLINICAL DATA:  Shortness of breath. EXAM: CHEST - 2 VIEW COMPARISON:  March 09, 2020. FINDINGS: Stable cardiomegaly. Mild central pulmonary vascular congestion is noted. Probable bilateral pulmonary edema is noted. Mild to moderate right pleural effusion is noted with associated right basilar atelectasis or infiltrate. Bony thorax is unremarkable. IMPRESSION: Stable cardiomegaly with mild central pulmonary vascular congestion and probable mild bilateral pulmonary edema. Mild-to-moderate right pleural effusion is noted with associated right basilar atelectasis or infiltrate.  Electronically Signed   By: Marijo Conception M.D.   On: 04/22/2022 15:53    Procedures Procedures    Medications Ordered in ED Medications  albuterol (VENTOLIN HFA) 108 (90 Base) MCG/ACT inhaler 2 puff (2 puffs Inhalation Given 04/22/22 1638)  furosemide (LASIX) injection 80 mg (has no administration in time range)    ED Course/ Medical Decision Making/ A&P Clinical Course as of 04/22/22 2018  Fri Apr 22, 2022  1802 O2 on room air at 79-80% while sitting in the recliner.  Patient placed on 2 L via nasal cannula and O2 immediately rose to 85 and then 93%. [SB]  1922 B Natriuretic PeptideMarland Kitchen):  2,938.0 [SB]  1954 Discussed with patient plans for admission.  Patient agreeable at this time.  Patient appears safe for admission at this time. [SB]  2009 Consult with hospitalist, Dr. Waldron Labs who will evaluate for admission.  [SB]    Clinical Course User Index [SB] Jude Linck A, PA-C                             Medical Decision Making Amount and/or Complexity of Data Reviewed Labs: ordered. Decision-making details documented in ED Course. Radiology: ordered.  Risk Prescription drug management. Decision regarding hospitalization.   Patient presents to the ED complaining of shortness of breath.  Also concerns for hypoxia.  Also notes bilateral lower extremity swelling x 3 months.  Patient initially hypoxic at 84%.  Placed on 2 L via nasal cannula.  Patient with a new oxygen requirement today.  Patient does not use oxygen at home.  On exam patient with diminished breath sounds noted throughout.  Echocardiogram on 03/08/2020 showed LVEF of 70-75%.  Differential diagnosis includes CHF exacerbation, pneumonia, COPD exacerbation.   Additional history obtained:  External records from outside source obtained and reviewed including: Patient was evaluated by her cardiology office today and sent to the emergency department due to concerns for hypoxia   Labs:  I ordered, and personally  interpreted labs.  The pertinent results include:  BNP elevated at 2938 CBC without leukocytosis BMP with slightly decreased potassium at 3.3, slightly elevated creatinine at 1.04, otherwise unremarkable Imaging: I ordered imaging studies including CXR I independently visualized and interpreted imaging which showed  Stable cardiomegaly with mild central pulmonary vascular congestion  and probable mild bilateral pulmonary edema. Mild-to-moderate right  pleural effusion is noted with associated right basilar atelectasis  or infiltrate.   I agree with the radiologist interpretation  Medications:  I ordered medication including 80 mg Lasix for diuresis I have reviewed the patients home medicines and have made adjustments as needed   Consultations: I requested consultation with the Hospitalist, Dr. Waldron [facility] And discussed lab and imaging findings as well as pertinent plan - they recommend: Admission  Disposition: Presentation suspicious for CHF exacerbation.  Doubt at this time concerns for COPD exacerbation.  Doubt at this time concerns for pneumonia. Concern for CHF exacerbation and further evaluations recommended. Patient reevaluated prior to consult and vital signs stable, in no acute distress, patient resting comfortably on stretcher. After consideration of the diagnostic results and the patients response to treatment, I feel that the patient would benefit from Admission to the hospital. Discussed admission plans with patient at bedside, patient agreeable at this time. Pt appears safe for admission.    This chart was dictated using voice recognition software, Dragon. Despite the best efforts of this provider to proofread and correct errors, errors may still occur which can change documentation meaning.  Final Clinical Impression(s) / ED Diagnoses Final diagnoses:  Acute on chronic congestive heart failure, unspecified heart failure type [facility])    Rx / DC Orders ED Discharge Orders      None         Klark Vanderhoef A, PA-C 04/22/22 2018    Fredia Sorrow, MD 04/25/22 0021

## 2022-04-23 ENCOUNTER — Inpatient Hospital Stay (HOSPITAL_COMMUNITY): Payer: Medicare HMO

## 2022-04-23 DIAGNOSIS — E1169 Type 2 diabetes mellitus with other specified complication: Secondary | ICD-10-CM | POA: Diagnosis not present

## 2022-04-23 DIAGNOSIS — I5033 Acute on chronic diastolic (congestive) heart failure: Secondary | ICD-10-CM

## 2022-04-23 DIAGNOSIS — E876 Hypokalemia: Secondary | ICD-10-CM

## 2022-04-23 DIAGNOSIS — J449 Chronic obstructive pulmonary disease, unspecified: Secondary | ICD-10-CM | POA: Diagnosis not present

## 2022-04-23 DIAGNOSIS — J9601 Acute respiratory failure with hypoxia: Secondary | ICD-10-CM | POA: Diagnosis not present

## 2022-04-23 LAB — ECHOCARDIOGRAM COMPLETE
AR max vel: 2 cm2
AV Area VTI: 1.86 cm2
AV Area mean vel: 1.94 cm2
AV Mean grad: 7 mmHg
AV Peak grad: 14.4 mmHg
Ao pk vel: 1.9 m/s
Area-P 1/2: 2.65 cm2
Est EF: >75
Height: 67 in
MV VTI: 1.19 cm2
S' Lateral: 1.1 cm
Weight: 2359.8 oz

## 2022-04-23 LAB — CBC
HCT: 41.4 % (ref 36.0–46.0)
Hemoglobin: 12.5 g/dL (ref 12.0–15.0)
MCH: 27 pg (ref 26.0–34.0)
MCHC: 30.2 g/dL (ref 30.0–36.0)
MCV: 89.4 fL (ref 80.0–100.0)
Platelets: 174 10*3/uL (ref 150–400)
RBC: 4.63 MIL/uL (ref 3.87–5.11)
RDW: 18.3 % — ABNORMAL HIGH (ref 11.5–15.5)
WBC: 3.9 10*3/uL — ABNORMAL LOW (ref 4.0–10.5)
nRBC: 0 % (ref 0.0–0.2)

## 2022-04-23 LAB — BASIC METABOLIC PANEL
Anion gap: 12 (ref 5–15)
BUN: 20 mg/dL (ref 8–23)
CO2: 29 mmol/L (ref 22–32)
Calcium: 8.7 mg/dL — ABNORMAL LOW (ref 8.9–10.3)
Chloride: 97 mmol/L — ABNORMAL LOW (ref 98–111)
Creatinine, Ser: 1.16 mg/dL — ABNORMAL HIGH (ref 0.44–1.00)
GFR, Estimated: 49 mL/min — ABNORMAL LOW (ref >60)
Glucose, Bld: 99 mg/dL (ref 70–99)
Potassium: 3.8 mmol/L (ref 3.5–5.1)
Sodium: 138 mmol/L (ref 135–145)

## 2022-04-23 LAB — HEMOGLOBIN A1C
Hgb A1c MFr Bld: 6.3 % — ABNORMAL HIGH (ref 4.8–5.6)
Mean Plasma Glucose: 134.11 mg/dL

## 2022-04-23 LAB — GLUCOSE, CAPILLARY
Glucose-Capillary: 143 mg/dL — ABNORMAL HIGH (ref 70–99)
Glucose-Capillary: 130 mg/dL — ABNORMAL HIGH (ref 70–99)

## 2022-04-23 MED ORDER — ORAL CARE MOUTH RINSE: 15.0000 mL | OROMUCOSAL | Status: DC | PRN

## 2022-04-23 MED ORDER — FUROSEMIDE 10 MG/ML IJ SOLN
40.0000 mg | Freq: Two times a day (BID) | INTRAMUSCULAR | Status: DC
  Administered 2022-04-23 – 2022-04-28 (×10): 40 mg via INTRAVENOUS
  Filled 2022-04-23 (×10): qty 4

## 2022-04-23 NOTE — Progress Notes (Signed)
Patient refusing cbg and insulin, states "I don't have diabetes and I am refusing insulin and anything that has to do with it". Dr. Dyann Kief notified and will talk to patient at bedside. Deirdre Pippins, RN

## 2022-04-23 NOTE — Progress Notes (Signed)
Progress Note   Patient: JAHLIYAH TRICE YQM:[id number] DOB: 11-05-1945 DOA: 04/22/2022     1 DOS: the patient was seen and examined on 04/23/2022   Brief hospital course: As per H&P written by Dr. Waldron Labs on 04/22/2022  Jamiya Nims  is a 77 y.o. female, with medical history significant for hypertension, dyslipidemia, COPD with ongoing tobacco use, PAD/carotid artery disease, and hypertrophic cardiomyopathy with established chronic diastolic CHF  -Patient presents to ED secondary to complaints of shortness of breath, she does report progressive dyspnea and lower extremity edema over the last 2-month and admission 2021 for acute on chronic diastolic CHF, she reports she is compliant with her medications, she denies any increased salt intake, she is supposed to be on Lasix 20 mg oral daily but she herself increased to 40 mg oral daily, she denies fever, chills, chest pain, nausea or vomiting, she was seen by cardiology office today where she was noted to be hypoxic, so she was instructed to come to ED for further evaluation, she does not wear oxygen at home -In ED she was hypoxic 84%, and at 1 point she did drop to 79% on room air, currently requiring 2 L nasal cannula workup significant for elevated BNP at 2990, chest x-ray significant for cardiomegaly, vascular congestion and pulmonary edema with right pleural effusion, she received IV Lasix, and [facility] hospitalist consulted to admit.  Assessment and Plan: 1-acute respiratory failure with hypoxia -Patient oxygen saturation 84% on room air at time of admission; 2 L nasal cannula in place to assist stabilizing  saturation levels. -Hypoxia appears to be secondary to fluid overload, vascular congestion and interstitial edema. -Continue IV diuresis, follow daily weights, strict intake and output and wean off oxygen supplementation as tolerated. -Desaturation screening will be assessed in AM.  2-acute on chronic diastolic heart failure -Follow echo  results -Continue to follow low-sodium diet, IV diuresis, daily weights, strict intake and output and Reds clip measurements. -Good urine output appreciated -Close monitoring of patient's renal function and electrolytes with repletion as needed. -Continue telemetry monitoring.  3-HOCM -Continue patient follow-up with cardiology service -Continue diuresis as mentioned above -Continue metoprolol.  4-hypokalemia -Associated with diuresis -Continue to follow electrolytes trend and further replete as needed.  5-history of COPD -Currently no wheezing, able to speak in full sentences. -Continue home bronchodilator management -Continue as needed albuterol.  6-history of tobacco abuse -Cessation counseling provided -Continue nicotine patch.  7-peripheral arterial disease and coronary artery disease -Cessation counseling for smoking provided. -Continue the use of aspirin -Continue cilostazol 100 mg twice a day -Continue statin -Continue outpatient follow-up with vascular service  8-hyperlipidemia -Continue statin.  9-hypertension -Elevated at time of admission; improving with use of antihypertensive agents and diuretics. -Continue to follow vital sign -Low-sodium diet and heart healthy diet discussed with patient.  10-prediabetes -A1c 6.3 -Holding oral hypoglycemic agents while inpatient -Patient know happy with sliding scale insulin and CBG monitoring -Education provided.   Subjective:  Reports feeling better; no chest pain, no nausea, no vomiting.  Still requiring 2 L nasal cannula supplementation and demonstrating peripheral edema with decreased breath sounds at the bases.  Physical Exam: Vitals:   04/23/22 0304 04/23/22 0455 04/23/22 0921 04/23/22 1018  BP: (!) 90/57 (!) 142/88  138/64  Pulse: 87 74  75  Resp: '16 18  14  '$ Temp: 97.6 F (36.4 C) 97.7 F (36.5 C)    TempSrc:  Oral    SpO2: 93% 99% 95% 98%  Weight:  66.9 kg  Height:       General exam: Alert,  awake, oriented x 3; no chest pain, no overnight events. Respiratory system: Decreased breath sounds at the bases; no wheezing, no using accessory muscle.  2 L nasal cannula in place. Cardiovascular system:RRR. No rubs, gallops, murmurs or JVD. Gastrointestinal system: Abdomen is nondistended, soft and nontender. No organomegaly or masses felt. Normal bowel sounds heard. Central nervous system: Alert and oriented. No focal neurological deficits. Extremities: No 2+ edema appreciated bilaterally extending up to her thighs. Skin: No petechiae. Psychiatry: Judgement and insight appear normal. Mood & affect appropriate.   Data Reviewed: Basic metabolic panel: Sodium 937, potassium 3.8, chloride 97, bicarb 29, BUN 20, creatinine 1.1, GFR 49 CBC: WBC 3.9, hemoglobin 12.5, platelet count 174 K CBGs: 125>>> 143  Family Communication: Daughter at bedside  Disposition: Status is: Inpatient Remains inpatient appropriate because: Continue IV diuresis as part of management of acute on chronic CHF.   Planned Discharge Destination: Home   Time spent: 35 minutes  Author: Barton Dubois, MD 04/23/2022 4:57 PM  For on call review [email or web address].

## 2022-04-24 DIAGNOSIS — E1169 Type 2 diabetes mellitus with other specified complication: Secondary | ICD-10-CM | POA: Diagnosis not present

## 2022-04-24 DIAGNOSIS — J449 Chronic obstructive pulmonary disease, unspecified: Secondary | ICD-10-CM | POA: Diagnosis not present

## 2022-04-24 DIAGNOSIS — J9601 Acute respiratory failure with hypoxia: Secondary | ICD-10-CM | POA: Diagnosis not present

## 2022-04-24 DIAGNOSIS — I5033 Acute on chronic diastolic (congestive) heart failure: Secondary | ICD-10-CM | POA: Diagnosis not present

## 2022-04-24 LAB — BASIC METABOLIC PANEL
Anion gap: 12 (ref 5–15)
BUN: 20 mg/dL (ref 8–23)
CO2: 34 mmol/L — ABNORMAL HIGH (ref 22–32)
Calcium: 8.7 mg/dL — ABNORMAL LOW (ref 8.9–10.3)
Chloride: 88 mmol/L — ABNORMAL LOW (ref 98–111)
Creatinine, Ser: 1.05 mg/dL — ABNORMAL HIGH (ref 0.44–1.00)
GFR, Estimated: 55 mL/min — ABNORMAL LOW (ref >60)
Glucose, Bld: 135 mg/dL — ABNORMAL HIGH (ref 70–99)
Potassium: 3.5 mmol/L (ref 3.5–5.1)
Sodium: 134 mmol/L — ABNORMAL LOW (ref 135–145)

## 2022-04-24 LAB — GLUCOSE, CAPILLARY
Glucose-Capillary: 110 mg/dL — ABNORMAL HIGH (ref 70–99)
Glucose-Capillary: 149 mg/dL — ABNORMAL HIGH (ref 70–99)
Glucose-Capillary: 144 mg/dL — ABNORMAL HIGH (ref 70–99)
Glucose-Capillary: 114 mg/dL — ABNORMAL HIGH (ref 70–99)

## 2022-04-24 MED ORDER — MELATONIN 3 MG PO TABS
6.0000 mg | ORAL_TABLET | Freq: Once | ORAL | Status: AC
  Administered 2022-04-24: 6 mg via ORAL
  Filled 2022-04-24: qty 2

## 2022-04-24 NOTE — Progress Notes (Signed)
Progress Note   Patient: Leslie [name] [id number]:[id number] DOB: [birth year] DOA: 04/22/2022     2 DOS: the patient was seen and examined on 04/24/2022   Brief hospital course: As per H&P written by Dr. Waldron Labs on 04/22/2022  Rilla Buckman  is a 77 y.o. female, with medical history significant for hypertension, dyslipidemia, COPD with ongoing tobacco use, PAD/carotid artery disease, and hypertrophic cardiomyopathy with established chronic diastolic CHF  -Patient presents to ED secondary to complaints of shortness of breath, she does report progressive dyspnea and lower extremity edema over the last 77-month and admission 2021 for acute on chronic diastolic CHF, she reports she is compliant with her medications, she denies any increased salt intake, she is supposed to be on Lasix 20 mg oral daily but she herself increased to 40 mg oral daily, she denies fever, chills, chest pain, nausea or vomiting, she was seen by cardiology office today where she was noted to be hypoxic, so she was instructed to come to ED for further evaluation, she does not wear oxygen at home -In ED she was hypoxic 84%, and at 1 point she did drop to 79% on room air, currently requiring 2 L nasal cannula workup significant for elevated BNP at 2990, chest x-ray significant for cardiomegaly, vascular congestion and pulmonary edema with right pleural effusion, she received IV Lasix, and [facility] hospitalist consulted to admit.  Assessment and Plan: 1-acute respiratory failure with hypoxia -Patient oxygen saturation 84% on room air at time of admission; 2 L nasal cannula in place to assist stabilizing  saturation levels. -Hypoxia appears to be secondary to fluid overload, vascular congestion and interstitial edema. -will Continue IV diuresis, follow daily weights, strict intake and output and wean off oxygen supplementation as tolerated. -Desaturation screening will be assessed in AM. -Since admission patient has lost close to 10 pounds.  2-acute  on chronic diastolic heart failure -Follow echo results -Continue to follow low-sodium diet, IV diuresis, daily weights, strict intake and output and Reds clip measurements (currently 41). -Good urine output appreciated -Close monitoring of patient's renal function and electrolytes with repletion as needed. -Continue telemetry monitoring.  3-HOCM -Continue patient follow-up with cardiology service -Continue diuresis as mentioned above -Continue metoprolol.  4-hypokalemia -Associated with diuresis -Continue to follow electrolytes trend and further replete as needed. -Current potassium 3.5.  5-history of COPD -Currently no wheezing, able to speak in full sentences. -Continue home bronchodilator management -Continue as needed albuterol.  6-history of tobacco abuse -Cessation counseling provided -Continue nicotine patch.  7-peripheral arterial disease and coronary artery disease -Cessation counseling for smoking provided. -Continue the use of aspirin -Continue cilostazol 100 mg twice a day -Continue statin -Continue outpatient follow-up with vascular service  8-hyperlipidemia -Continue statin. -Heart healthy diet discussed with patient.  9-hypertension -Elevated at time of admission; improving with use of antihypertensive agents and diuretics. -Continue to follow vital sign -Low-sodium diet and heart healthy diet discussed with patient.  10-prediabetes -A1c 6.3 -Holding oral hypoglycemic agents while inpatient -Patient know happy with sliding scale insulin and CBG monitoring -Education provided.   Subjective:  In no major distress; reporting no nausea, no vomiting, no chest pain.  Patient with short winded sensation with activity and is still signs of fluid overload.  Good urine output reported.   Physical Exam: Vitals:   04/24/22 0200 04/24/22 0537 04/24/22 0803 04/24/22 0947  BP: (!) 102/58 102/65 (!) 71/51 (!) 92/55  Pulse: (!) 102 (!) 104 97 98  Resp:  20  18  Temp:  98.5 F (36.9 C)  98.2 F (36.8 C)  TempSrc:  Oral  Oral  SpO2: 97% 92%  95%  Weight:  63.5 kg    Height:       General exam: Alert, awake, oriented x 3; requiring 2 L nasal cannula supplementation; experiencing short winded sensation with activity and is still having significant signs of fluid overload (lower extremity edema).  Good urine output reported. Respiratory system: Decreased breath sounds at the bases; no wheezing, no using accessory muscle. Cardiovascular system:RRR. No rubs or gallops; no JVD appreciated. Gastrointestinal system: Abdomen is nondistended, soft and nontender. No organomegaly or masses felt. Normal bowel sounds heard. Central nervous system: Alert and oriented. No focal neurological deficits. Extremities: No cyanosis or clubbing; TED hose in place.  2+ edema up to her thighs appreciated bilaterally. Skin: No petechiae. Psychiatry: Judgement and insight appear normal. Mood & affect appropriate.   Data Reviewed: Basic metabolic panel: Sodium 970, potassium 3.5, chloride 88, bicarb 34, BUN 20, creatinine 1.05 and GFR 55.  Family Communication: Daughter at bedside  Disposition: Status is: Inpatient Remains inpatient appropriate because: Continue IV diuresis as part of management of acute on chronic CHF.   Planned Discharge Destination: Home    Time spent: 35 minutes  Author: Barton Dubois, MD 04/24/2022 5:33 PM  For on call review [email or web address].

## 2022-04-24 NOTE — Progress Notes (Addendum)
SATURATION QUALIFICATIONS: (This note is used to comply with regulatory documentation for home oxygen) Patient Saturations on Room Air at Rest = 76%  Patient Saturations on Room Air while Ambulating = 70%  Patient Saturations on 95% Liters of oxygen while Ambulating = 2L

## 2022-04-24 NOTE — Progress Notes (Signed)
   04/24/22 1002  ReDS Vest / Clip  Station Marker C  Ruler Value 27  ReDS Value Range (!) > 40  ReDS Actual Value 41

## 2022-04-25 DIAGNOSIS — E1169 Type 2 diabetes mellitus with other specified complication: Secondary | ICD-10-CM | POA: Diagnosis not present

## 2022-04-25 DIAGNOSIS — J9601 Acute respiratory failure with hypoxia: Secondary | ICD-10-CM | POA: Diagnosis not present

## 2022-04-25 DIAGNOSIS — J449 Chronic obstructive pulmonary disease, unspecified: Secondary | ICD-10-CM | POA: Diagnosis not present

## 2022-04-25 DIAGNOSIS — I5033 Acute on chronic diastolic (congestive) heart failure: Secondary | ICD-10-CM | POA: Diagnosis not present

## 2022-04-25 LAB — BASIC METABOLIC PANEL
Anion gap: 13 (ref 5–15)
BUN: 20 mg/dL (ref 8–23)
CO2: 30 mmol/L (ref 22–32)
Calcium: 8.5 mg/dL — ABNORMAL LOW (ref 8.9–10.3)
Chloride: 89 mmol/L — ABNORMAL LOW (ref 98–111)
Creatinine, Ser: 0.89 mg/dL (ref 0.44–1.00)
GFR, Estimated: >60 mL/min (ref >60)
Glucose, Bld: 112 mg/dL — ABNORMAL HIGH (ref 70–99)
Potassium: 3.5 mmol/L (ref 3.5–5.1)
Sodium: 132 mmol/L — ABNORMAL LOW (ref 135–145)

## 2022-04-25 LAB — GLUCOSE, CAPILLARY
Glucose-Capillary: 119 mg/dL — ABNORMAL HIGH (ref 70–99)
Glucose-Capillary: 182 mg/dL — ABNORMAL HIGH (ref 70–99)
Glucose-Capillary: 125 mg/dL — ABNORMAL HIGH (ref 70–99)

## 2022-04-25 MED ORDER — MIDODRINE HCL 5 MG PO TABS
2.5000 mg | ORAL_TABLET | Freq: Three times a day (TID) | ORAL | Status: DC
  Administered 2022-04-25 (×3): 2.5 mg via ORAL
  Filled 2022-04-25 (×3): qty 1

## 2022-04-25 MED ORDER — MIDODRINE HCL 5 MG PO TABS
10.0000 mg | ORAL_TABLET | Freq: Three times a day (TID) | ORAL | Status: DC
  Administered 2022-04-25 – 2022-05-02 (×21): 10 mg via ORAL
  Filled 2022-04-25 (×21): qty 2

## 2022-04-25 MED ORDER — SODIUM CHLORIDE 0.9 % IV BOLUS
500.0000 mL | Freq: Once | INTRAVENOUS | Status: AC
  Administered 2022-04-25: 500 mL via INTRAVENOUS

## 2022-04-25 MED ORDER — MECLIZINE HCL 12.5 MG PO TABS: 12.5000 mg | ORAL_TABLET | Freq: Three times a day (TID) | ORAL | Status: DC | PRN

## 2022-04-25 MED ORDER — METOPROLOL TARTRATE 5 MG/5ML IV SOLN
2.5000 mg | Freq: Once | INTRAVENOUS | Status: AC
  Administered 2022-04-25: 2.5 mg via INTRAVENOUS
  Filled 2022-04-25: qty 5

## 2022-04-25 NOTE — Progress Notes (Signed)
RN called due to patient being tachycardic, EKG was ordered and showed sinus tachycardia at a rate of 119 bpm with repolarization abnormality, patient was asymptomatic.  Unfortunately, patient's BP continued to be soft which resulted in her not being able to have metoprolol (home medication) in more than 24 hours.  We shall continue telemetry and continue to monitor patient at this time and treat accordingly.

## 2022-04-25 NOTE — Progress Notes (Signed)
Received order for EKG 12-lead. EKG done. MD Adefeso notified.

## 2022-04-25 NOTE — Progress Notes (Signed)
   04/25/22 1946  Assess: MEWS Score  Temp (!) 97.5 F (36.4 C)  BP (!) 68/42  MAP (mmHg) (!) 49  Pulse Rate (!) 43  SpO2 95 %  Assess: MEWS Score  MEWS Temp 0  MEWS Systolic 3  MEWS Pulse 1  MEWS RR 0  MEWS LOC 0  MEWS Score 4  MEWS Score Color Red  Treat  Pain Scale 0-10  Pain Score 0  Take Vital Signs  Increase Vital Sign Frequency  Red: Q 1hr X 4 then Q 4hr X 4, if remains red, continue Q 4hrs  Escalate  MEWS: Escalate Red: discuss with charge nurse/RN and provider, consider discussing with RRT  Notify: Charge Nurse/RN  Name of Charge Nurse/RN Notified Bre  Date Charge Nurse/RN Notified 04/25/22  Time Charge Nurse/RN Notified 2004  Provider Notification  Provider Name/Title Zierie  Date Provider Notified 04/25/22  Time Provider Notified 2003  Notification Reason Other (Comment) (Red MEWS)  Provider response See new orders  Date of Provider Response 04/25/22  Time of Provider Response 2004  Assess: SIRS CRITERIA  SIRS Temperature  0  SIRS Pulse 0  SIRS Respirations  0  SIRS WBC 0  SIRS Score Sum  0

## 2022-04-25 NOTE — Progress Notes (Signed)
Patient ambulated to the BR. Patient SOB. O2 checked. 89% on 2L nasal cannula. HR jumped up to the 150s. Patient now up in chair. HR 128. Patient placed on 3L. O2 at 92%. MD Josephine Cables made aware. Will continue to monitor.

## 2022-04-25 NOTE — TOC Initial Note (Signed)
Transition of Care [facility]) - Initial/Assessment Note    Patient Details  Name: Leslie [name] MRN: [id number] Date of Birth: 1945/11/04  Transition [facility]) CM/SW Contact:    Salome Arnt, [place] Phone Number: 04/25/2022, 8:11 AM  Clinical Narrative:  Pt admitted due to acute respiratory failure with hypoxia. TOC consulted for CHF screening. Pt reports she lives alone and is independent with ADLs. No DME or home health prior to admission. Pt indicates she drives herself to her appointments. She plans to return home when medically stable.  CHF screening completed. Pt states she was diagnosed with CHF a couple years ago. She weighs herself about 3 times a week. Pt cooks most of her meals but said she hasn't really been following a heart healthy diet. She takes medications as prescribed. Will order Living Well with CHF book for pt. Discussed HHRN for continued CHF education and pt is agreeable with no preference on agency. Referred and accepted by [facility] with Alvis Lemmings. Will need HHRN order.                  Expected Discharge Plan: [place] Barriers to Discharge: Continued Medical Work up   Patient Goals and CMS Choice Patient states their goals for this hospitalization and ongoing recovery are:: return home   Choice offered to / list presented to : Patient [place] ownership interest in [facility].provided to::  (n/a)    Expected Discharge Plan and Services In-house Referral: Clinical Social Work   Post Acute Care Choice: [place] arrangements for the past 2 months: Apartment                           HH Arranged: RN [place] Agency: [place] Date [place]: 04/25/22 Time [place]: [phone] Representative spoke with at [place]: [place] Arrangements/Services Living arrangements for the past 2 months: [place] with:: Self Patient language and need for interpreter reviewed:: Yes Do you  feel safe going back to the place where you live?: Yes      Need for Family Participation in Patient Care: No (Comment)     Criminal Activity/Legal Involvement Pertinent to Current Situation/Hospitalization: No - Comment as needed  Activities of Daily Living Home Assistive Devices/Equipment: None ADL Screening (condition at time of admission) Patient's cognitive ability adequate to safely complete daily activities?: Yes Is the patient deaf or have difficulty hearing?: No Does the patient have difficulty seeing, even when wearing glasses/contacts?: No Does the patient have difficulty concentrating, remembering, or making decisions?: No Patient able to express need for assistance with ADLs?: Yes Does the patient have difficulty dressing or bathing?: No Independently performs ADLs?: Yes (appropriate for developmental age) Does the patient have difficulty walking or climbing stairs?: Yes Weakness of Legs: None Weakness of Arms/Hands: None  Permission Sought/Granted                  Emotional Assessment     Affect (typically observed): Appropriate Orientation: : Oriented to Self, Oriented to Place, Oriented to  Time, Oriented to Situation Alcohol / Substance Use: Not Applicable Psych Involvement: No (comment)  Admission diagnosis:  Acute respiratory failure with hypoxia ([place]) [J96.01] Acute on chronic congestive heart failure, unspecified heart failure type [facility]) [I50.9] Patient Active Problem List   Diagnosis Date Noted   Acute respiratory failure with hypoxia ([place]) 04/22/2022   Visual disturbances 04/05/2022  Encounter for annual general medical examination with abnormal findings in adult 12/28/2021   Periorbital cellulitis of right eye 12/28/2021   Need for immunization against influenza 12/28/2021   Diabetes mellitus ([place]) 08/24/2021   Vitamin D deficiency 08/24/2021   Bilateral lower extremity edema 04/30/2021   Aortic atherosclerosis ([place]) 09/10/2020   Acute on  chronic diastolic HF (heart failure) (H[facility])    COPD (chronic obstructive pulmonary disease) ([place]) 03/08/2020   Acute CHF ([place]) 03/07/2020   Cough    Oropharyngeal dysphagia    Vitamin D deficiency disease 06/18/2019   CHF (congestive heart failure) ([place]) 03/19/2015   Hypertension 03/19/2015   Peripheral vascular disease ([place]) 03/19/2015   Congestive heart failure (CHF) ([place]) 03/19/2015   Aftercare following surgery of the circulatory system, NEC 05/14/2012   Carotid artery stenosis 03/12/2012   Non compliance w medication regimen secondary to cost 10/04/2011   Arthritis, rheumatoid ([place]) 10/04/2011   Dizziness 10/03/2011   PAD, high grade RICA by MRI 10/03/11 10/03/2011   Tobacco abuse 10/03/2011   LVH (left ventricular hypertrophy) 10/03/2011   Renal artery stenosis: left 50% 2005 10/03/2011   HLD (hyperlipidemia) 11/21/2007   PCP:  [name], FNP Pharmacy:   [facility] [place], [address] [place] [address] Phone: [phone] Fax: [phone]  [facility] [place], [address] [place] [address] Phone: [phone] Fax: [phone]     Social Determinants of Health (SDOH) Social History: SDOH Screenings   Food Insecurity: No Food Insecurity (04/22/2022)  Housing: Low Risk  (04/22/2022)  Transportation Needs: No Transportation Needs (04/22/2022)  Utilities: Not At Risk (04/22/2022)  Alcohol Screen: Low Risk  (03/30/2021)  Depression (PHQ2-9): Low Risk  (04/11/2022)  Recent Concern: Depression (PHQ2-9) - Medium Risk (04/05/2022)  Financial Resource Strain: Medium Risk (03/30/2021)  Physical Activity: Inactive (03/30/2021)  Social Connections: Moderately Integrated (03/30/2021)  Stress: No Stress Concern Present (03/30/2021)  Tobacco Use: High Risk (04/22/2022)   SDOH Interventions:     Readmission Risk Interventions     No data to display

## 2022-04-25 NOTE — Progress Notes (Signed)
Progress Note   Patient: Leslie [name] [id number]:[id number] DOB: Nov 25, 1945 DOA: 04/22/2022     3 DOS: the patient was seen and examined on 04/25/2022   Brief hospital course: As per H&P written by Dr. Waldron Labs on 04/22/2022  Leslie [name]  is a 77 y.o. female, with medical history significant for hypertension, dyslipidemia, COPD with ongoing tobacco use, PAD/carotid artery disease, and hypertrophic cardiomyopathy with established chronic diastolic CHF  -Patient presents to ED secondary to complaints of shortness of breath, she does report progressive dyspnea and lower extremity edema over the last 81-month and admission 2021 for acute on chronic diastolic CHF, she reports she is compliant with her medications, she denies any increased salt intake, she is supposed to be on Lasix 20 mg oral daily but she herself increased to 40 mg oral daily, she denies fever, chills, chest pain, nausea or vomiting, she was seen by cardiology office today where she was noted to be hypoxic, so she was instructed to come to ED for further evaluation, she does not wear oxygen at home -In ED she was hypoxic 84%, and at 1 point she did drop to 79% on room air, currently requiring 2 L nasal cannula workup significant for elevated BNP at 2990, chest x-ray significant for cardiomegaly, vascular congestion and pulmonary edema with right pleural effusion, she received IV Lasix, and [facility] hospitalist consulted to admit.  Assessment and Plan: 1-acute respiratory failure with hypoxia -Patient oxygen saturation 84% on room air at time of admission; 2 L nasal cannula in place to assist stabilizing  saturation levels. -Hypoxia appears to be secondary to fluid overload, vascular congestion and interstitial edema. -will Continue IV diuresis, follow daily weights, strict intake and output and wean off oxygen supplementation as tolerated. -Desaturation screening will be assessed in AM. -Since admission patient has lost close to 10 pounds.  2-acute  on chronic diastolic heart failure -Follow echo results -Continue to follow low-sodium diet, IV diuresis, daily weights, strict intake and output and Reds clip measurements (currently 41). -Good urine output appreciated -Close monitoring of patient's renal function and electrolytes with repletion as needed. -Continue telemetry monitoring.  3-HOCM -Continue patient follow-up with cardiology service -Continue diuresis as mentioned above -Continue metoprolol.  4-hypokalemia -Associated with diuresis -Continue to follow electrolytes trend and further replete as needed. -Current potassium 3.5.  5-history of COPD -Currently no wheezing, able to speak in full sentences. -Continue home bronchodilator management -Continue as needed albuterol. -Stable overall.  6-history of tobacco abuse -Cessation counseling provided -Continue nicotine patch.  7-peripheral arterial disease and coronary artery disease -Cessation counseling for smoking provided. -Continue the use of aspirin -Continue cilostazol 100 mg twice a day -Continue statin -Continue outpatient follow-up with vascular service  8-hyperlipidemia -Continue statin. -Heart healthy diet discussed with patient.  9-hypertension -Blood pressure currently soft -Continue metoprolol and SR midodrine will continue diuresis. -Follow-up vital signs.  10-prediabetes -A1c 6.3 -Holding oral hypoglycemic agents while inpatient -Patient know happy with sliding scale insulin and CBG monitoring -Education provided.  11-dizziness -As needed meclizine will be provided. -Midodrine also initiated, going to assess blood pressure while providing diuresis. -Follow clinical response.   Subjective:  Reporting feeling slightly dizzy, overnight with sinus tachycardia and increased shortness of breath.  3 L nasal cannula in place.  No nausea, no vomiting, continues to the straight good urine output.  No chest pain.  Physical Exam: Vitals:    04/25/22 0800 04/25/22 0900 04/25/22 1106 04/25/22 1253  BP: 99/82  (!) 90/56 90/60  Pulse: (Marland Kitchen  128  (!) 104 95  Resp: 20  (!) 22 18  Temp:   98.2 F (36.8 C) 98.6 F (37 C)  TempSrc:   Oral Oral  SpO2: 90% 94% 98% 96%  Weight:      Height:       General exam: Alert, awake, oriented x 3; overnight experiencing elevated heart rate and soft blood pressure.  Patient reporting feeling dizzy.  3 L nasal cannula supplementation in place. Respiratory system: Decreased breath sounds at the bases; no using accessory muscle.  Positive rhonchi.  No wheezing Cardiovascular system:RRR. No no rubs or gallops. Gastrointestinal system: Abdomen is nondistended, soft and nontender. No organomegaly or masses felt. Normal bowel sounds heard. Central nervous system: Alert and oriented. No focal neurological deficits. Extremities: No cyanosis or clubbing; 2+ edema appreciated bilaterally (improving overall). Skin: No petechiae Psychiatry: Judgement and insight appear normal. Mood & affect appropriate.   Data Reviewed: Basic metabolic panel: Sodium 239, potassium 3.5, chloride 89, bicarb 30, BUN 20, creatinine 0.89 and GFR more than 60.  Family Communication: No family at bedside on today's evaluation.  Disposition: Status is: Inpatient Remains inpatient appropriate because: Continue IV diuresis as part of management of acute on chronic CHF.   Planned Discharge Destination: Home with home health services.  Time spent: 35 minutes  Author: Barton Dubois, MD 04/25/2022 6:00 PM  For on call review [email or web address].

## 2022-04-25 NOTE — Progress Notes (Signed)
   04/25/22 0000  Vitals  Temp 98.4 F (36.9 C)  Temp Source Oral  BP (!) 65/48 (MD Notified, rechecked in R arm.)  MAP (mmHg) (!) 54  BP Location Left Arm  BP Method Automatic  Patient Position (if appropriate) Lying  Pulse Rate (!) 112  Pulse Rate Source Dinamap  Resp (!) 22  MEWS COLOR  MEWS Score Color Red  Oxygen Therapy  SpO2 95 %  O2 Device Nasal Cannula  O2 Flow Rate (L/min) 2 L/min  MEWS Score  MEWS Temp 0  MEWS Systolic 3  MEWS Pulse 2  MEWS RR 1  MEWS LOC 0  MEWS Score 6     MD Adefeso notified. Charge Nurse Lovey Newcomer made aware. BP rechecked in R arm. 103/69 MAP 78. Patient asymptomatic. No pain. Back to Yellow MEWS. Will continue to monitor.

## 2022-04-25 NOTE — Progress Notes (Signed)
   04/25/22 0800  Vitals  BP 99/82  MAP (mmHg) 89  Pulse Rate (!) 128  Resp 20  MEWS COLOR  MEWS Score Color Yellow  Oxygen Therapy  SpO2 90 %  O2 Device Nasal Cannula  MEWS Score  MEWS Temp 0  MEWS Systolic 1  MEWS Pulse 2  MEWS RR 0  MEWS LOC 0  MEWS Score 3  Provider Notification  Provider Name/Title [place] MD  Date Provider Notified 04/25/22  Time Provider Notified [phone]  Method of Notification  (Chat)  Notification Reason Other (Comment) (Yellow MEWS)  Provider response No new orders  Date of Provider Response 04/25/22  Time of Provider Response [phone]

## 2022-04-26 DIAGNOSIS — E1169 Type 2 diabetes mellitus with other specified complication: Secondary | ICD-10-CM | POA: Diagnosis not present

## 2022-04-26 DIAGNOSIS — I5033 Acute on chronic diastolic (congestive) heart failure: Secondary | ICD-10-CM | POA: Diagnosis not present

## 2022-04-26 DIAGNOSIS — J449 Chronic obstructive pulmonary disease, unspecified: Secondary | ICD-10-CM | POA: Diagnosis not present

## 2022-04-26 DIAGNOSIS — J9601 Acute respiratory failure with hypoxia: Secondary | ICD-10-CM | POA: Diagnosis not present

## 2022-04-26 LAB — BASIC METABOLIC PANEL
Anion gap: 9 (ref 5–15)
BUN: 22 mg/dL (ref 8–23)
CO2: 33 mmol/L — ABNORMAL HIGH (ref 22–32)
Calcium: 8.2 mg/dL — ABNORMAL LOW (ref 8.9–10.3)
Chloride: 90 mmol/L — ABNORMAL LOW (ref 98–111)
Creatinine, Ser: 1.06 mg/dL — ABNORMAL HIGH (ref 0.44–1.00)
GFR, Estimated: 55 mL/min — ABNORMAL LOW (ref >60)
Glucose, Bld: 91 mg/dL (ref 70–99)
Potassium: 3.9 mmol/L (ref 3.5–5.1)
Sodium: 132 mmol/L — ABNORMAL LOW (ref 135–145)

## 2022-04-26 MED ORDER — DIPHENOXYLATE-ATROPINE 2.5-0.025 MG PO TABS
1.0000 | ORAL_TABLET | Freq: Four times a day (QID) | ORAL | Status: DC | PRN
  Administered 2022-04-26: 1 via ORAL
  Filled 2022-04-26: qty 1

## 2022-04-26 NOTE — Progress Notes (Signed)
   04/26/22 1138  ReDS Vest / Clip  Station Marker C  Ruler Value 27  ReDS Value Range (!) > 40  ReDS Actual Value 46

## 2022-04-26 NOTE — Progress Notes (Signed)
Progress Note   Patient: Leslie [name] [id number]:[id number] DOB: [birth year] DOA: 04/22/2022     4 DOS: the patient was seen and examined on 04/26/2022   Brief hospital course: As per H&P written by Dr. Waldron Labs on 04/22/2022  Leslie [name]  is a 77 y.o. female, with medical history significant for hypertension, dyslipidemia, COPD with ongoing tobacco use, PAD/carotid artery disease, and hypertrophic cardiomyopathy with established chronic diastolic CHF  -Patient presents to ED secondary to complaints of shortness of breath, she does report progressive dyspnea and lower extremity edema over the last 44-month and admission 2021 for acute on chronic diastolic CHF, she reports she is compliant with her medications, she denies any increased salt intake, she is supposed to be on Lasix 20 mg oral daily but she herself increased to 40 mg oral daily, she denies fever, chills, chest pain, nausea or vomiting, she was seen by cardiology office today where she was noted to be hypoxic, so she was instructed to come to ED for further evaluation, she does not wear oxygen at home -In ED she was hypoxic 84%, and at 1 point she did drop to 79% on room air, currently requiring 2 L nasal cannula workup significant for elevated BNP at 2990, chest x-ray significant for cardiomegaly, vascular congestion and pulmonary edema with right pleural effusion, she received IV Lasix, and [facility] hospitalist consulted to admit.  Assessment and Plan: 1-acute respiratory failure with hypoxia -Patient oxygen saturation 84% on room air at time of admission; 2 L nasal cannula in place to assist stabilizing  saturation levels. -Hypoxia appears to be secondary to fluid overload, vascular congestion and interstitial edema. -will Continue IV diuresis, follow daily weights, strict intake and output and wean off oxygen supplementation as tolerated. -Desaturation screening demonstrating the need of oxygen supplementation; at this moment requiring 3 L nasal  cannula to maintain saturation. -Anticipating the need of oxygen supplementation at time of discharge.  2-acute on chronic diastolic heart failure -Follow echo results -Continue to follow low-sodium diet, IV diuresis, daily weights, strict intake and output and Reds clip measurements (currently 41). -Good urine output appreciated.  Since admission patient is almost 12 pounds lighter and overall approximately 12 L negative balance. -Close monitoring of patient's renal function and electrolytes with repletion as needed. -Continue telemetry monitoring.  3-HOCM -Continue patient follow-up with cardiology service -Continue diuresis as mentioned above -Continue metoprolol.  4-hypokalemia -Associated with diuresis -Continue to follow electrolytes trend and further replete as needed. -Current potassium 3.5.  5-history of COPD -Currently no wheezing, able to speak in full sentences. -Continue home bronchodilator management -Continue as needed albuterol. -Stable overall.  6-history of tobacco abuse -Cessation counseling provided -Continue nicotine patch.  7-peripheral arterial disease and coronary artery disease -Cessation counseling for smoking provided. -Continue the use of aspirin -Continue cilostazol 100 mg twice a day -Continue statin -Continue outpatient follow-up with vascular service  8-hyperlipidemia -Continue statin. -Heart healthy diet discussed with patient.  9-hypertension -Blood pressure currently soft -Continue metoprolol and SR midodrine will continue diuresis. -Follow-up vital signs.  10-prediabetes -A1c 6.3 -Holding oral hypoglycemic agents while inpatient -Patient know happy with sliding scale insulin and CBG monitoring -Education provided.  11-dizziness -Continue as needed meclizine and the use of midodrine while providing diuresis. -Patient expressed no dizziness currently.  12-diarrhea -As needed Lomotil will be provided -Continue supportive  care.   Subjective:  Reds clip demonstrating measurement more than 40; patient denies chest pain, nausea or vomiting.  Overall expressed breathing is getting better.  Good urine output appreciated.  Electrolytes and renal function stable.  Patient expressed some loose stools.  Physical Exam: Vitals:   04/26/22 0525 04/26/22 0727 04/26/22 1647 04/26/22 1649  BP: (!) 104/59 (!) 100/58 (!) 88/61 100/71  Pulse: 92 94 72 73  Resp: (!) '22 18 20   '$ Temp: 98.3 F (36.8 C) 97.8 F (36.6 C) 98.7 F (37.1 C)   TempSrc: Oral Oral Oral   SpO2: 99% 99% 99%   Weight: 63.3 kg     Height:       General exam: Alert, awake, oriented x 3; no dizziness, no, no nausea, no vomiting.  Reports some loose stools.  Expressed no chest pain and is overall feeling that her breathing is better. Respiratory system: Decreased breath sounds at the bases; positive rhonchi, no wheezing, no using accessory muscles. Cardiovascular system: Rate controlled, no rubs, no gallops, no JVD on exam. Gastrointestinal system: Abdomen is nondistended, soft and nontender. No organomegaly or masses felt. Normal bowel sounds heard.  Mild increase in her abdominal girth in the flank areas. Central nervous system: Alert and oriented. No focal neurological deficits. Extremities: No cyanosis or clubbing; TED hose in place.  2+ edema appreciated up to her thighs. Skin: No petechiae. Psychiatry: Judgement and insight appear normal. Mood & affect appropriate.   Data Reviewed: Basic metabolic panel: Sodium 592, potassium 3.9, chloride 90, CO2 33, BUN 22, creatinine 1.06 and GFR 55.  Family Communication: No family at bedside on today's evaluation.  Disposition: Status is: Inpatient  Remains inpatient appropriate because: Continue IV diuresis as part of management of acute on chronic CHF.   Planned Discharge Destination: Home with home health services.  Time spent: 35 minutes  Author: Barton Dubois, MD 04/26/2022 5:52 PM  For on  call review [email or web address].

## 2022-04-26 NOTE — Progress Notes (Signed)
   04/25/22 0800  ReDS Vest / Clip  Station Marker C  Ruler Value 30  ReDS Value Range (!) > 40  ReDS Actual Value 41

## 2022-04-27 DIAGNOSIS — J9601 Acute respiratory failure with hypoxia: Secondary | ICD-10-CM | POA: Diagnosis not present

## 2022-04-27 LAB — BASIC METABOLIC PANEL
Anion gap: 12 (ref 5–15)
BUN: 22 mg/dL (ref 8–23)
CO2: 31 mmol/L (ref 22–32)
Calcium: 8.4 mg/dL — ABNORMAL LOW (ref 8.9–10.3)
Chloride: 89 mmol/L — ABNORMAL LOW (ref 98–111)
Creatinine, Ser: 1.03 mg/dL — ABNORMAL HIGH (ref 0.44–1.00)
GFR, Estimated: 57 mL/min — ABNORMAL LOW (ref >60)
Glucose, Bld: 91 mg/dL (ref 70–99)
Potassium: 4.1 mmol/L (ref 3.5–5.1)
Sodium: 132 mmol/L — ABNORMAL LOW (ref 135–145)

## 2022-04-27 MED ORDER — LIVING BETTER WITH HEART FAILURE BOOK: Freq: Once | Status: AC

## 2022-04-27 MED ORDER — LOSARTAN POTASSIUM 50 MG PO TABS
50.0000 mg | ORAL_TABLET | Freq: Every day | ORAL | Status: DC
  Administered 2022-04-28: 50 mg via ORAL
  Filled 2022-04-27: qty 1

## 2022-04-27 NOTE — Progress Notes (Signed)
Progress Note   Patient: Leslie [name] [id number]:[id number] DOB: 1945-06-17 DOA: 04/22/2022     5 DOS: the patient was seen and examined on 04/27/2022   Brief hospital course: As per H&P written by Dr. Waldron Labs on 04/22/2022  Leslie [name]  is a 77 y.o. female, with medical history significant for hypertension, dyslipidemia, COPD with ongoing tobacco use, PAD/carotid artery disease, and hypertrophic cardiomyopathy with established chronic diastolic CHF  -Patient presents to ED secondary to complaints of shortness of breath, she does report progressive dyspnea and lower extremity edema over the last 107-month and admission 2021 for acute on chronic diastolic CHF, she reports she is compliant with her medications, she denies any increased salt intake, she is supposed to be on Lasix 20 mg oral daily but she herself increased to 40 mg oral daily, she denies fever, chills, chest pain, nausea or vomiting, she was seen by cardiology office today where she was noted to be hypoxic, so she was instructed to come to ED for further evaluation, she does not wear oxygen at home -In ED she was hypoxic 84%, and at 1 point she did drop to 79% on room air, currently requiring 2 L nasal cannula workup significant for elevated BNP at 2990, chest x-ray significant for cardiomegaly, vascular congestion and pulmonary edema with right pleural effusion, she received IV Lasix, and [facility] hospitalist consulted to admit.  Assessment and Plan: 1-acute respiratory failure with hypoxia -Patient oxygen saturation 84% on room air at time of admission; 2 L nasal cannula in place to assist stabilizing  saturation levels. -Presumed secondary to CHF/volume overload -Continue IV diuresis -Currently requiring 3 to 4 L of oxygen via nasal cannula  2-acute on chronic diastolic heart failure -Echo with hyperdynamic function with EF greater than 75 patient with concerns for possible amyloid cardiomyopathy and grade 1 diastolic dysfunction -Continue to  follow low-sodium diet, IV diuresis, daily weights, strict intake and output and Reds clip measurements (currently 41). -Good urine output appreciated.  Since admission patient is almost 12 pounds lighter and overall approximately 12 L negative balance. -Close monitoring of patient's renal function and electrolytes with repletion as needed. -Continue telemetry monitoring.  3-HOCM -Echo from 04/23/2022 with concerns for amyloid cardiomyopathy with severe concentric LVH and Mid-cavitary systolic obliteration of the LV cavity with mildly increased gradient noted  -Continue patient follow-up with cardiology service -Continue diuresis as mentioned above -Continue metoprolol.  4-hypokalemia -Associated with diuresis -Continue to follow electrolytes trend and further replete as needed.  5-history of COPD -Continue bronchodilators  6-history of tobacco abuse -Cessation counseling provided -Continue nicotine patch.  7-peripheral arterial disease and coronary artery disease -Cessation counseling for smoking provided. -Continue aspirin, Pletal and statin -Continue outpatient follow-up with vascular service  8-hyperlipidemia -Continue statin. -Heart healthy diet discussed with patient.  9-hypertension -Blood pressure currently soft -Continue metoprolol and SR midodrine will continue diuresis. -Follow-up vital signs.  10-prediabetes -A1c 6.3 -Holding oral hypoglycemic agents while inpatient Use Novolog/Humalog Sliding scale insulin with Accu-Cheks/Fingersticks as ordered   11-dizziness -Continue as needed meclizine and the use of midodrine while providing diuresis. -Patient expressed no dizziness currently.  12-diarrhea -As needed Lomotil will be provided -Continue supportive care.   Subjective:  -Dyspnea and hypoxia persist -Voiding okay  Physical Exam: Vitals:   04/27/22 0821 04/27/22 0826 04/27/22 0836 04/27/22 1329  BP:   102/60 98/79  Pulse:   93 70  Resp:      Temp:       TempSrc:  SpO2: 94% 95%  93%  Weight:      Height:        Physical Exam  Gen:- Awake Alert, in no acute distress , significant dyspnea with minimal activity HEENT:- [place].AT, No sclera icterus Neck-Supple Neck, +ve JVD,.  Lungs-diminished breath sounds with bibasilar faint rales CV- S1, S2 normal, RRR Abd-  +ve B.Sounds, Abd Soft, No tenderness,    Extremity/Skin:-  +ve  edema,   good pedal pulses  Psych-affect is appropriate, oriented x3 Neuro-generalized weakness, no new focal deficits, no tremors  Family Communication: No family at bedside on today's evaluation.  Disposition: Anticipate discharge home with home health services Status is: Inpatient  Remains inpatient appropriate because: Continue IV diuresis as part of management of acute on chronic CHF.   Planned Discharge Destination: Home with home health services.   Author: Roxan Hockey, MD 04/27/2022 7:30 PM  For on call review [email or web address].

## 2022-04-27 NOTE — Progress Notes (Signed)
Mobility Specialist Progress Note:    04/27/22 1026  Mobility  Activity Ambulated independently to bathroom;Transferred from bed to chair  Level of Assistance Standby assist, set-up cues, supervision of patient - no hands on  Assistive Device None  Distance Ambulated (ft) 20 ft  Activity Response Tolerated well  Mobility Referral Yes  $Mobility charge 1 Mobility   Pt was agreeable to mobility session, deferred hallway ambulation d/t low BP and dizziness. Was able to ambulate to/from restroom before sitting up in chair. Left pt in chair with all needs met.   EOB BP: 110/54 Standing BP: 95/48 Sitting up in chair BP: 97/64  [place] Specialist Please contact via SecureChat or  Rehab office at [phone]

## 2022-04-28 DIAGNOSIS — J9601 Acute respiratory failure with hypoxia: Secondary | ICD-10-CM | POA: Diagnosis not present

## 2022-04-28 LAB — BASIC METABOLIC PANEL
Anion gap: 12 (ref 5–15)
BUN: 16 mg/dL (ref 8–23)
CO2: 31 mmol/L (ref 22–32)
Calcium: 8.6 mg/dL — ABNORMAL LOW (ref 8.9–10.3)
Chloride: 90 mmol/L — ABNORMAL LOW (ref 98–111)
Creatinine, Ser: 0.91 mg/dL (ref 0.44–1.00)
GFR, Estimated: >60 mL/min (ref >60)
Glucose, Bld: 90 mg/dL (ref 70–99)
Potassium: 3.8 mmol/L (ref 3.5–5.1)
Sodium: 133 mmol/L — ABNORMAL LOW (ref 135–145)

## 2022-04-28 LAB — RESP PANEL BY RT-PCR (RSV, FLU A&B, COVID)  RVPGX2
Influenza A by PCR: NEGATIVE
Influenza B by PCR: NEGATIVE
Resp Syncytial Virus by PCR: NEGATIVE
SARS Coronavirus 2 by RT PCR: NEGATIVE

## 2022-04-28 MED ORDER — FUROSEMIDE 10 MG/ML IJ SOLN
20.0000 mg | Freq: Two times a day (BID) | INTRAMUSCULAR | Status: DC
  Administered 2022-04-28 – 2022-04-30 (×4): 20 mg via INTRAVENOUS
  Filled 2022-04-28 (×4): qty 2

## 2022-04-28 MED ORDER — LOSARTAN POTASSIUM 50 MG PO TABS
25.0000 mg | ORAL_TABLET | Freq: Every day | ORAL | Status: DC
  Administered 2022-04-29 – 2022-05-05 (×7): 25 mg via ORAL
  Filled 2022-04-28 (×7): qty 1

## 2022-04-28 NOTE — Progress Notes (Signed)
EKG done and given to nurse 

## 2022-04-28 NOTE — Progress Notes (Signed)
Pt's complaining of SOB, SP02 was 87, HR 112. Oxygen turned up to 4L, SP02 94 and HR 102. Pt complained of pain in mid sternal area. EKG done, results in chart. MD made aware.

## 2022-04-28 NOTE — Progress Notes (Signed)
   04/28/22 0911  Assess: MEWS Score  Temp 97.9 F (36.6 C)  BP 107/64 (MD Courage made aware.)  Pulse Rate (!) 111  Resp 20  SpO2 94 %  O2 Device Nasal Cannula  O2 Flow Rate (L/min) 4 L/min  Assess: MEWS Score  MEWS Temp 0  MEWS Systolic 0  MEWS Pulse 2  MEWS RR 0  MEWS LOC 0  MEWS Score 2  MEWS Score Color Yellow  Assess: if the MEWS score is Yellow or Red  Were vital signs taken at a resting state? Yes  Focused Assessment Change from prior assessment (see assessment flowsheet)  Does the patient meet 2 or more of the SIRS criteria? No  MEWS guidelines implemented  Yes, yellow  Treat  MEWS Interventions Considered administering scheduled or prn medications/treatments as ordered  Pain Score 0  Take Vital Signs  Increase Vital Sign Frequency  Yellow: Q2hr x1, continue Q4hrs until patient remains green for 12hrs  Increase Vital Sign Frequency  Yellow: Q 2hr X 2 then Q 4hr X 2, if remains yellow, continue Q 4hrs  Escalate  MEWS: Escalate Yellow: Discuss with charge nurse and consider notifying provider and/or RRT  MEWS: Escalate Yellow: discuss with charge nurse/RN and consider discussing with provider and RRT  Notify: Charge Nurse/RN  Name of Charge Nurse/RN Notified Bevely Palmer RN  Date Charge Nurse/RN Notified 04/28/22  Time Charge Nurse/RN Notified [phone]  Provider Notification  Provider Name/Title MD Courage  Date Provider Notified 04/28/22  Time Provider Notified [phone]  Method of Notification Page (Secure chat)  Notification Reason Other (Comment) (yellow MEWs)  Provider response No new orders  Date of Provider Response 04/28/22  Assess: SIRS CRITERIA  SIRS Temperature  0  SIRS Pulse 1  SIRS Respirations  0  SIRS WBC 0  SIRS Score Sum  1  Pain  Pain Scale 0-10

## 2022-04-28 NOTE — Progress Notes (Signed)
   04/28/22 1217  ReDS Vest / Clip  Station Marker C  Ruler Value 25  ReDS Value Range 36 - 40  ReDS Actual Value 37

## 2022-04-28 NOTE — Progress Notes (Signed)
   04/28/22 1444  Assess: MEWS Score  Temp 98.4 F (36.9 C)  BP 123/67  MAP (mmHg) 83  Pulse Rate 90  Resp 20  SpO2 96 %  O2 Device Nasal Cannula  O2 Flow Rate (L/min) 4 L/min  Assess: MEWS Score  MEWS Temp 0  MEWS Systolic 0  MEWS Pulse 0  MEWS RR 0  MEWS LOC 0  MEWS Score 0  MEWS Score Color Green  Assess: SIRS CRITERIA  SIRS Temperature  0  SIRS Pulse 0  SIRS Respirations  0  SIRS WBC 0  SIRS Score Sum  0

## 2022-04-28 NOTE — Progress Notes (Signed)
Patient complaining of increased shortness of breath noted heart rate ranging between 125-109. Vital signs: T-97.9, P-111, R-20, BP-107/64, O2-94% at 4 liters. MD Courage made aware. No new orders.

## 2022-04-28 NOTE — Progress Notes (Signed)
Progress Note   Patient: Leslie [name] [id number]:[id number] DOB: 19-Mar-1946 DOA: 04/22/2022     6 DOS: the patient was seen and examined on 04/28/2022   Brief hospital course: As per H&P written by Dr. Waldron Labs on 04/22/2022  Leslie [name]  is a 77 y.o. female, with medical history significant for hypertension, dyslipidemia, COPD with ongoing tobacco use, PAD/carotid artery disease, and hypertrophic cardiomyopathy with established chronic diastolic CHF  -Patient presents to ED secondary to complaints of shortness of breath, she does report progressive dyspnea and lower extremity edema over the last 64-month and admission 2021 for acute on chronic diastolic CHF, she reports she is compliant with her medications, she denies any increased salt intake, she is supposed to be on Lasix 20 mg oral daily but she herself increased to 40 mg oral daily, she denies fever, chills, chest pain, nausea or vomiting, she was seen by cardiology office today where she was noted to be hypoxic, so she was instructed to come to ED for further evaluation, she does not wear oxygen at home -In ED she was hypoxic 84%, and at 1 point she did drop to 79% on room air, currently requiring 2 L nasal cannula workup significant for elevated BNP at 2990, chest x-ray significant for cardiomegaly, vascular congestion and pulmonary edema with right pleural effusion, she received IV Lasix, and [facility] hospitalist consulted to admit.  Assessment and Plan: 1-acute respiratory failure with hypoxia -Patient oxygen saturation 84% on room air at time of admission; 2 L nasal cannula in place to assist stabilizing  saturation levels. -Presumed secondary to CHF/volume overload 04/28/22  -Patient with tachycardia and worsening dyspnea overnight -Developed hypotension so we will reduce IV Lasix to 20 mg every 12 hours -Currently requiring 3 to 4 L of oxygen via nasal cannula  2-acute on chronic diastolic heart failure -Echo with hyperdynamic function with EF  greater than 75 patient with concerns for possible amyloid cardiomyopathy and grade 1 diastolic dysfunction -Continue IV diuresis -Daily weight and fluid input and output monitoring -Close monitoring of patient's renal function and electrolytes with repletion as needed. -Continue telemetry monitoring.  3-HOCM -Echo from 04/23/2022 with concerns for amyloid cardiomyopathy with severe concentric LVH and Mid-cavitary systolic obliteration of the LV cavity with mildly increased gradient noted  -Continue patient follow-up with cardiology service -Continue diuresis as mentioned above -Continue metoprolol.  4-hypokalemia -Associated with diuresis -Continue to follow electrolytes trend and further replete as needed.  5-history of COPD -Continue bronchodilators  6-history of tobacco abuse -Cessation counseling provided -Continue nicotine patch.  7-peripheral arterial disease and coronary artery disease -Cessation counseling for smoking provided. -Continue aspirin, Pletal and statin -Continue outpatient follow-up with vascular service  8-hyperlipidemia -Continue statin. -Heart healthy diet discussed with patient.  9-hypertension -Blood pressure currently soft -Continue metoprolol and SR midodrine will continue diuresis. -Follow-up vital signs.  10-prediabetes -A1c 6.3 -Holding oral hypoglycemic agents while inpatient Use Novolog/Humalog Sliding scale insulin with Accu-Cheks/Fingersticks as ordered   11-dizziness -Continue as needed meclizine and the use of midodrine while providing diuresis. -Patient expressed no dizziness currently.  12-diarrhea -As needed Lomotil will be provided -Continue supportive care.   Subjective:  -Had episode of tachycardia and worsening dyspnea on hypoxia overnight -EKG was reassuring and received breathing treatments,  -Developed hypotension with IV Lasix  Physical Exam: Vitals:   04/28/22 1206 04/28/22 1342 04/28/22 1444 04/28/22 1722  BP:  (!) 78/65 (!) 91/48 123/67 103/62  Pulse: 95 94 90 96  Resp: 18 (!) 21 20 20  Temp: 98.4 F (36.9 C) 98.3 F (36.8 C) 98.4 F (36.9 C) 98.2 F (36.8 C)  TempSrc: Oral Oral Oral Oral  SpO2: 96% 93% 96% 94%  Weight:      Height:        Physical Exam  Gen:- Awake Alert, in no acute distress , significant dyspnea with minimal activity HEENT:- [place].AT, No sclera icterus Nose- [place] 3L/min Neck-Supple Neck, +ve JVD,.  Lungs-diminished breath sounds with bibasilar faint rales CV- S1, S2 normal, RRR Abd-  +ve B.Sounds, Abd Soft, No tenderness,    Extremity/Skin:-  +ve  edema,   good pedal pulses  Psych-affect is appropriate, oriented x3 Neuro-generalized weakness, no new focal deficits, no tremors  Family Communication: No family at bedside on today's evaluation.  Disposition: Anticipate discharge home with home health services Status is: Inpatient  Remains inpatient appropriate because: Continue IV diuresis as part of management of acute on chronic CHF.   Planned Discharge Destination: Home with home health services.   Author: Roxan Hockey, MD 04/28/2022 6:30 PM  For on call review [email or web address].

## 2022-04-29 DIAGNOSIS — J9601 Acute respiratory failure with hypoxia: Secondary | ICD-10-CM | POA: Diagnosis not present

## 2022-04-29 MED ORDER — METHYLPREDNISOLONE SODIUM SUCC 40 MG IJ SOLR
40.0000 mg | Freq: Two times a day (BID) | INTRAMUSCULAR | Status: DC
  Administered 2022-04-29 – 2022-05-05 (×13): 40 mg via INTRAVENOUS
  Filled 2022-04-29 (×13): qty 1

## 2022-04-29 NOTE — Progress Notes (Signed)
Vitals stable, pt slept through the night with no c/o pain or discomfort. SP02 100% on 3.5 L this a.m.

## 2022-04-29 NOTE — Progress Notes (Signed)
Progress Note   Patient: Leslie [name] DOB: 03/28/1945 DOA: 04/22/2022     7 DOS: the patient was seen and examined on 04/29/2022   Brief hospital course: As per H&P written by Dr. Waldron Labs on 04/22/2022  Leslie [name]  is a 77 y.o. female, with medical history significant for hypertension, dyslipidemia, COPD with ongoing tobacco use, PAD/carotid artery disease, and hypertrophic cardiomyopathy with established chronic diastolic CHF  -Patient presents to ED secondary to complaints of shortness of breath, she does report progressive dyspnea and lower extremity edema over the last 71-month and admission 2021 for acute on chronic diastolic CHF, she reports she is compliant with her medications, she denies any increased salt intake, she is supposed to be on Lasix 20 mg oral daily but she herself increased to 40 mg oral daily, she denies fever, chills, chest pain, nausea or vomiting, she was seen by cardiology office today where she was noted to be hypoxic, so she was instructed to come to ED for further evaluation, she does not wear oxygen at home -In ED she was hypoxic 84%, and at 1 point she did drop to 79% on room air, currently requiring 2 L nasal cannula workup significant for elevated BNP at 2990, chest x-ray significant for cardiomegaly, vascular congestion and pulmonary edema with right pleural effusion, she received IV Lasix, and [facility] hospitalist consulted to admit.  Assessment and Plan: 1-acute respiratory failure with hypoxia -Patient oxygen saturation 84% on room air at time of admission; 2 L nasal cannula in place to assist stabilizing  saturation levels. -Presumed secondary to CHF/volume overload 04/29/22  -Hypoxia and dyspnea persist -Unable to titrate IV Lasix dose up due to soft BP -Currently requiring 3 to 4 L of oxygen via nasal cannula  2-acute on chronic diastolic heart failure -Echo with hyperdynamic function with EF greater than 75 patient with concerns for possible  amyloid cardiomyopathy and grade 1 diastolic dysfunction -Continue IV diuresis -Daily weight and fluid input and output monitoring -Close monitoring of patient's renal function and electrolytes with repletion as needed. -Continue telemetry monitoring.  3-HOCM -Echo from 04/23/2022 with concerns for amyloid cardiomyopathy with severe concentric LVH and Mid-cavitary systolic obliteration of the LV cavity with mildly increased gradient noted  -Continue patient follow-up with cardiology service -Continue diuresis as mentioned above -Continue metoprolol.  4-hypokalemia -Associated with diuresis -Continue to follow electrolytes trend and further replete as needed.  5-history of COPD -Continue bronchodilators  6-history of tobacco abuse -Cessation counseling provided -Continue nicotine patch.  7-peripheral arterial disease and coronary artery disease -Cessation counseling for smoking provided. -Continue aspirin, Pletal and statin -Continue outpatient follow-up with vascular service  8-hyperlipidemia -Continue statin. -Heart healthy diet discussed with patient.  9-hypertension -Blood pressure currently soft -Continue metoprolol and SR midodrine will continue diuresis. -Follow-up vital signs.  10-prediabetes -A1c 6.3 -Holding oral hypoglycemic agents while inpatient Use Novolog/Humalog Sliding scale insulin with Accu-Cheks/Fingersticks as ordered   11-dizziness -Continue as needed meclizine and the use of midodrine while providing diuresis. -Patient expressed no dizziness currently.  12-diarrhea -As needed Lomotil will be provided -Continue supportive care.   Subjective:  Dyspnea on exertion, orthopnea and hypoxia persist -Voiding okay  Physical Exam: Vitals:   04/29/22 0419 04/29/22 0500 04/29/22 0841 04/29/22 1436  BP: 96/64   100/64  Pulse: 93   87  Resp: 19   18  Temp: 97.7 F (36.5 C)   98.5 F (36.9 C)  TempSrc: Oral   Oral  SpO2: 99%  90% 99%  Weight:   61.4 kg    Height:       Physical Exam  Gen:- Awake Alert, in no acute distress , significant dyspnea with minimal activity HEENT:- [place].AT, No sclera icterus Nose- [place] 3L/min Neck-Supple Neck, +ve JVD,.  Lungs-diminished breath sounds with bibasilar faint rales CV- S1, S2 normal, RRR Abd-  +ve B.Sounds, Abd Soft, No tenderness,    Extremity/Skin:-  +ve  edema,   good pedal pulses  Psych-affect is appropriate, oriented x3 Neuro-generalized weakness, no new focal deficits, no tremors  Family Communication: No family at bedside on today's evaluation.  Disposition: Anticipate discharge home with home health services Status is: Inpatient  Remains inpatient appropriate because: Continue IV diuresis as part of management of acute on chronic CHF.   Planned Discharge Destination: Home with home health services.   Author: Roxan Hockey, MD 04/29/2022 6:18 PM  For on call review [email or web address].

## 2022-04-29 NOTE — Care Management Important Message (Signed)
Important Message  Patient Details  Name: Leslie [name] MRN: [id number] Date of Birth: Jul 23, 1945   Medicare Important Message Given:  Yes     Tommy Medal 04/29/2022, 12:20 PM

## 2022-04-30 ENCOUNTER — Inpatient Hospital Stay (HOSPITAL_COMMUNITY): Payer: Medicare HMO

## 2022-04-30 DIAGNOSIS — J9601 Acute respiratory failure with hypoxia: Secondary | ICD-10-CM | POA: Diagnosis not present

## 2022-04-30 LAB — RENAL FUNCTION PANEL
Albumin: 2.8 g/dL — ABNORMAL LOW (ref 3.5–5.0)
Anion gap: 13 (ref 5–15)
BUN: 19 mg/dL (ref 8–23)
CO2: 29 mmol/L (ref 22–32)
Calcium: 8.8 mg/dL — ABNORMAL LOW (ref 8.9–10.3)
Chloride: 89 mmol/L — ABNORMAL LOW (ref 98–111)
Creatinine, Ser: 0.97 mg/dL (ref 0.44–1.00)
GFR, Estimated: >60 mL/min (ref >60)
Glucose, Bld: 155 mg/dL — ABNORMAL HIGH (ref 70–99)
Phosphorus: 3.5 mg/dL (ref 2.5–4.6)
Potassium: 4 mmol/L (ref 3.5–5.1)
Sodium: 131 mmol/L — ABNORMAL LOW (ref 135–145)

## 2022-04-30 LAB — CBC
HCT: 38.9 % (ref 36.0–46.0)
Hemoglobin: 12.2 g/dL (ref 12.0–15.0)
MCH: 27.2 pg (ref 26.0–34.0)
MCHC: 31.4 g/dL (ref 30.0–36.0)
MCV: 86.8 fL (ref 80.0–100.0)
Platelets: 205 10*3/uL (ref 150–400)
RBC: 4.48 MIL/uL (ref 3.87–5.11)
RDW: 17.7 % — ABNORMAL HIGH (ref 11.5–15.5)
WBC: 4.5 10*3/uL (ref 4.0–10.5)
nRBC: 0 % (ref 0.0–0.2)

## 2022-04-30 MED ORDER — FUROSEMIDE 10 MG/ML IJ SOLN
6.0000 mg/h | INTRAVENOUS | Status: DC
  Administered 2022-04-30: 4 mg/h via INTRAVENOUS
  Administered 2022-05-02: 6 mg/h via INTRAVENOUS
  Filled 2022-04-30 (×3): qty 20

## 2022-04-30 NOTE — Plan of Care (Addendum)
Patient Leslie [name], VSS throughout shift.  All meds given as ordered.  Diminished lungs, IS encouraged.  Pt ambulated to bathroom with steady gait.  Pt went to x-ray this morning.  POC maintained, will continue to monitor.  Problem: Education: Goal: Ability to demonstrate management of disease process will improve Outcome: Progressing Goal: Ability to verbalize understanding of medication therapies will improve Outcome: Progressing Goal: Individualized Educational Video(s) Outcome: Progressing   Problem: Activity: Goal: Capacity to carry out activities will improve Outcome: Progressing   Problem: Cardiac: Goal: Ability to achieve and maintain adequate cardiopulmonary perfusion will improve Outcome: Progressing   Problem: Education: Goal: Knowledge of General Education information will improve Description: Including pain rating scale, medication(s)/side effects and non-pharmacologic comfort measures Outcome: Progressing   Problem: Health Behavior/Discharge Planning: Goal: Ability to manage health-related needs will improve Outcome: Progressing   Problem: Clinical Measurements: Goal: Ability to maintain clinical measurements within normal limits will improve Outcome: Progressing Goal: Will remain free from infection Outcome: Progressing Goal: Diagnostic test results will improve Outcome: Progressing Goal: Respiratory complications will improve Outcome: Progressing Goal: Cardiovascular complication will be avoided Outcome: Progressing   Problem: Activity: Goal: Risk for activity intolerance will decrease Outcome: Progressing   Problem: Nutrition: Goal: Adequate nutrition will be maintained Outcome: Progressing   Problem: Coping: Goal: Level of anxiety will decrease Outcome: Progressing   Problem: Elimination: Goal: Will not experience complications related to bowel motility Outcome: Progressing Goal: Will not experience complications related to urinary retention Outcome:  Progressing   Problem: Pain Managment: Goal: General experience of comfort will improve Outcome: Progressing   Problem: Safety: Goal: Ability to remain free from injury will improve Outcome: Progressing   Problem: Skin Integrity: Goal: Risk for impaired skin integrity will decrease Outcome: Progressing   Problem: Education: Goal: Ability to describe self-care measures that may prevent or decrease complications (Diabetes Survival Skills Education) will improve Outcome: Progressing Goal: Individualized Educational Video(s) Outcome: Progressing   Problem: Coping: Goal: Ability to adjust to condition or change in health will improve Outcome: Progressing   Problem: Fluid Volume: Goal: Ability to maintain a balanced intake and output will improve Outcome: Progressing   Problem: Health Behavior/Discharge Planning: Goal: Ability to identify and utilize available resources and services will improve Outcome: Progressing Goal: Ability to manage health-related needs will improve Outcome: Progressing   Problem: Metabolic: Goal: Ability to maintain appropriate glucose levels will improve Outcome: Progressing   Problem: Nutritional: Goal: Maintenance of adequate nutrition will improve Outcome: Progressing Goal: Progress toward achieving an optimal weight will improve Outcome: Progressing   Problem: Skin Integrity: Goal: Risk for impaired skin integrity will decrease Outcome: Progressing   Problem: Tissue Perfusion: Goal: Adequacy of tissue perfusion will improve Outcome: Progressing

## 2022-04-30 NOTE — Progress Notes (Addendum)
Progress Note   Patient: Leslie [name] [id number] DOB: 1945/11/20 DOA: 04/22/2022     8 DOS: the patient was seen and examined on 04/30/2022   Brief hospital course: As per H&P written by Dr. Waldron Labs on 04/22/2022  Leslie [name]  is a 77 y.o. female, with medical history significant for hypertension, dyslipidemia, COPD with ongoing tobacco use, PAD/carotid artery disease, and hypertrophic cardiomyopathy with established chronic diastolic CHF  -Patient presents to ED secondary to complaints of shortness of breath, she does report progressive dyspnea and lower extremity edema over the last 68-month and admission 2021 for acute on chronic diastolic CHF, she reports she is compliant with her medications, she denies any increased salt intake, she is supposed to be on Lasix 20 mg oral daily but she herself increased to 40 mg oral daily, she denies fever, chills, chest pain, nausea or vomiting, she was seen by cardiology office today where she was noted to be hypoxic, so she was instructed to come to ED for further evaluation, she does not wear oxygen at home -In ED she was hypoxic 84%, and at 1 point she did drop to 79% on room air, currently requiring 2 L nasal cannula workup significant for elevated BNP at 2990, chest x-ray significant for cardiomegaly, vascular congestion and pulmonary edema with right pleural effusion, she received IV Lasix, and [facility] hospitalist consulted to admit.  Assessment and Plan: 1-acute respiratory failure with hypoxia -Patient oxygen saturation 84% on room air at time of admission; 2 L nasal cannula in place to assist stabilizing  saturation levels. -Presumed secondary to CHF/volume overload 04/30/22  -Hypoxia and dyspnea persist -Unable to titrate IV Lasix dose up due to soft BP -Currently requiring 3 to 4 L of oxygen via nasal cannula --No significant weight change over the last 48 hours -Not voiding as much anymore -DC bolus Lasix -Start IV continuous Lasix drip and  titrate up rate as BP allows  2-acute on chronic diastolic heart failure leading to #1 above -Echo with hyperdynamic function with EF greater than 75 patient with concerns for possible amyloid cardiomyopathy and grade 1 diastolic dysfunction -IV Lasix as above #1 -Daily weight and fluid input and output monitoring -Close monitoring of patient's renal function and electrolytes with repletion as needed. -Continue telemetry monitoring.  3-HOCM -Echo from 04/23/2022 with concerns for amyloid cardiomyopathy with severe concentric LVH and Mid-cavitary systolic obliteration of the LV cavity with mildly increased gradient noted  -Continue patient follow-up with cardiology service -iv Lasix  as above #1 -Continue metoprolol.  4-hypokalemia -Associated with diuresis -Continue to follow electrolytes trend and further replete as needed.  5-history of COPD -Continue bronchodilators  6-history of tobacco abuse -Cessation counseling provided -Continue nicotine patch.  7-peripheral arterial disease and coronary artery disease -Cessation counseling for smoking provided. -Continue aspirin, Pletal and statin -Continue outpatient follow-up with vascular service  8-hyperlipidemia -Continue statin. -Heart healthy diet discussed with patient.  9-hypertension -Blood pressure currently soft -Continue metoprolol and SR midodrine will continue diuresis. -Follow-up vital signs.  10-prediabetes -A1c 6.3 -Holding oral hypoglycemic agents while inpatient Use Novolog/Humalog Sliding scale insulin with Accu-Cheks/Fingersticks as ordered   11-dizziness -Continue as needed meclizine and the use of midodrine while providing diuresis. -Patient expressed no dizziness currently.  12-diarrhea -As needed Lomotil will be provided -Continue supportive care.   Subjective:  Dyspnea on exertion, orthopnea and hypoxia persist--unable to wean off oxygen -- Not voiding as much anymore  Physical Exam: Vitals:    04/30/22 0500 04/30/22 0959 04/30/22  1620 04/30/22 1622  BP:   (!) 162/67 133/70  Pulse:   76 73  Resp:   20   Temp:   98.1 F (36.7 C)   TempSrc:   Oral   SpO2:  (!) 9% 98% 98%  Weight: 61.4 kg     Height:       Physical Exam  Gen:- Awake Alert, in no acute distress , significant dyspnea with minimal activity HEENT:- [place].AT, No sclera icterus Nose- [place] 3L/min Neck-Supple Neck, +ve JVD,.  Lungs-diminished breath sounds with bibasilar faint rales CV- S1, S2 normal, RRR, 3/6 SM Abd-  +ve B.Sounds, Abd Soft, No tenderness,    Extremity/Skin:-  +ve  edema,   good pedal pulses  Psych-affect is appropriate, oriented x3 Neuro-generalized weakness, no new focal deficits, no tremors  Family Communication: Discussed with daughter  at bedside    Disposition: Anticipate discharge home with home health services Status is: Inpatient  Remains inpatient appropriate because: Continue IV diuresis as part of management of acute on chronic CHF.   Planned Discharge Destination: Home with home health services.   Author: Roxan Hockey, MD 04/30/2022 4:57 PM  For on call review [email or web address].

## 2022-05-01 DIAGNOSIS — J9601 Acute respiratory failure with hypoxia: Secondary | ICD-10-CM | POA: Diagnosis not present

## 2022-05-01 LAB — BASIC METABOLIC PANEL
Anion gap: 10 (ref 5–15)
BUN: 29 mg/dL — ABNORMAL HIGH (ref 8–23)
CO2: 31 mmol/L (ref 22–32)
Calcium: 8.8 mg/dL — ABNORMAL LOW (ref 8.9–10.3)
Chloride: 90 mmol/L — ABNORMAL LOW (ref 98–111)
Creatinine, Ser: 1.33 mg/dL — ABNORMAL HIGH (ref 0.44–1.00)
GFR, Estimated: 42 mL/min — ABNORMAL LOW (ref >60)
Glucose, Bld: 147 mg/dL — ABNORMAL HIGH (ref 70–99)
Potassium: 4.4 mmol/L (ref 3.5–5.1)
Sodium: 131 mmol/L — ABNORMAL LOW (ref 135–145)

## 2022-05-01 MED ORDER — FUROSEMIDE 10 MG/ML IJ SOLN
INTRAMUSCULAR | Status: AC
  Filled 2022-05-01: qty 20

## 2022-05-01 NOTE — Progress Notes (Signed)
Patient slept through the night during this shift. 1 prn medication given. Patient continues to be monitored by the centralized telemetry department with a reported heart rate of normal sinus rhythm. Patient given an incentive spirometer and educated. Teach back successful. Current oxygen saturation is 95% on 3L/min. Plan of care ongoing.

## 2022-05-01 NOTE — Progress Notes (Signed)
Progress Note   Patient: Leslie [name] [id number] DOB: June 20, 1945 DOA: 04/22/2022     9 DOS: the patient was seen and examined on 05/01/2022   Brief hospital course: As per H&P written by Dr. Waldron Labs on 04/22/2022  Leslie [name]  is a 76 y.o. female, with medical history significant for hypertension, dyslipidemia, COPD with ongoing tobacco use, PAD/carotid artery disease, and hypertrophic cardiomyopathy with established chronic diastolic CHF  -Patient presents to ED secondary to complaints of shortness of breath, she does report progressive dyspnea and lower extremity edema over the last 72-month and admission 2021 for acute on chronic diastolic CHF, she reports she is compliant with her medications, she denies any increased salt intake, she is supposed to be on Lasix 20 mg oral daily but she herself increased to 40 mg oral daily, she denies fever, chills, chest pain, nausea or vomiting, she was seen by cardiology office today where she was noted to be hypoxic, so she was instructed to come to ED for further evaluation, she does not wear oxygen at home -In ED she was hypoxic 84%, and at 1 point she did drop to 79% on room air, currently requiring 2 L nasal cannula workup significant for elevated BNP at 2990, chest x-ray significant for cardiomegaly, vascular congestion and pulmonary edema with right pleural effusion, she received IV Lasix, and [facility] hospitalist consulted to admit.  Assessment and Plan: 1-acute respiratory failure with hypoxia -Patient oxygen saturation 84% on room air at time of admission; -Presumed secondary to CHF/volume overload 05/01/22  -Hypoxia and dyspnea persist -Desaturated very quickly with attempt to wean off oxygen today -titrate IV Lasix drip dose up as BP allows -Currently requiring  4 L of oxygen via nasal cannula --No significant weight change over the last 48 hours -Not voiding as much anymore -DC bolus Lasix -c/n IV continuous Lasix drip and titrate up rate as BP  allows    2-acute on chronic diastolic heart failure leading to #1 above -Echo with hyperdynamic function with EF greater than 75 patient with concerns for possible amyloid cardiomyopathy and grade 1 diastolic dysfunction -IV Lasix as above #1 -Daily weight and fluid input and output monitoring -Close monitoring of patient's renal function and electrolytes with repletion as needed. -Continue telemetry monitoring.  3-HOCM -Echo from 04/23/2022 with concerns for amyloid cardiomyopathy with severe concentric LVH and Mid-cavitary systolic obliteration of the LV cavity with mildly increased gradient noted  -Continue patient follow-up with cardiology service -iv Lasix  as above #1 -Continue metoprolol.  4-hypokalemia -Associated with diuresis -Continue to follow electrolytes trend and further replete as needed.  5-history of COPD -Continue bronchodilators  6-history of tobacco abuse -Cessation counseling provided -Continue nicotine patch.  7-peripheral arterial disease and coronary artery disease -Cessation counseling for smoking provided. -Continue aspirin, Pletal and statin -Continue outpatient follow-up with vascular service  8-hyperlipidemia -Continue statin. -Heart healthy diet discussed with patient.  9-hypertension -Blood pressure currently soft -Continue metoprolol and SR midodrine will continue diuresis. -Follow-up vital signs.  10-prediabetes -A1c 6.3 -Holding oral hypoglycemic agents while inpatient Use Novolog/Humalog Sliding scale insulin with Accu-Cheks/Fingersticks as ordered   11-dizziness -Continue as needed meclizine and the use of midodrine while providing diuresis. -Patient expressed no dizziness currently.  12-diarrhea -As needed Lomotil will be provided -Continue supportive care.   Subjective:  Dyspnea on exertion, orthopnea and hypoxia persist--unable to wean off oxygen -Desaturated very quickly with attempt to wean off oxygen today  No  Nausea, Vomiting    Physical Exam: Vitals:  04/30/22 1930 04/30/22 2026 05/01/22 0453 05/01/22 0500  BP:  (!) 124/58 (!) 117/58   Pulse:  90 97   Resp:  20 16   Temp:  97.9 F (36.6 C) 98.1 F (36.7 C)   TempSrc:  Oral Oral   SpO2: 97% 95% 95%   Weight:    61.6 kg  Height:       Physical Exam  Gen:- Awake Alert, in no acute distress , significant dyspnea with minimal activity HEENT:- [place].AT, No sclera icterus Nose- [place] 4L/min Neck-Supple Neck, +ve JVD,.  Lungs-diminished breath sounds with bibasilar faint rales CV- S1, S2 normal, RRR, 3/6 SM Abd-  +ve B.Sounds, Abd Soft, No tenderness,    Extremity/Skin:-  +ve  edema,   good pedal pulses  Psych-affect is appropriate, oriented x3 Neuro-generalized weakness, no new focal deficits, no tremors  Family Communication: No Family at bedside today  Disposition: Anticipate discharge home with home health services Status is: Inpatient  Remains inpatient appropriate because: Continue IV diuresis as part of management of acute on chronic CHF.   Planned Discharge Destination: Home with home health services.   Author: Roxan Hockey, MD 05/01/2022 9:26 AM  For on call review [email or web address].

## 2022-05-02 ENCOUNTER — Inpatient Hospital Stay (HOSPITAL_COMMUNITY): Payer: Medicare HMO

## 2022-05-02 DIAGNOSIS — J9601 Acute respiratory failure with hypoxia: Secondary | ICD-10-CM | POA: Diagnosis not present

## 2022-05-02 LAB — BASIC METABOLIC PANEL
Anion gap: 12 (ref 5–15)
BUN: 40 mg/dL — ABNORMAL HIGH (ref 8–23)
CO2: 31 mmol/L (ref 22–32)
Calcium: 9.1 mg/dL (ref 8.9–10.3)
Chloride: 89 mmol/L — ABNORMAL LOW (ref 98–111)
Creatinine, Ser: 1.35 mg/dL — ABNORMAL HIGH (ref 0.44–1.00)
GFR, Estimated: 41 mL/min — ABNORMAL LOW (ref >60)
Glucose, Bld: 184 mg/dL — ABNORMAL HIGH (ref 70–99)
Potassium: 3.6 mmol/L (ref 3.5–5.1)
Sodium: 132 mmol/L — ABNORMAL LOW (ref 135–145)

## 2022-05-02 LAB — PROCALCITONIN: Procalcitonin: <0.1 ng/mL

## 2022-05-02 MED ORDER — FUROSEMIDE 10 MG/ML IJ SOLN
10.0000 mg/h | INTRAVENOUS | Status: AC
  Administered 2022-05-02: 10 mg/h via INTRAVENOUS
  Filled 2022-05-02: qty 20

## 2022-05-02 MED ORDER — FUROSEMIDE 10 MG/ML IJ SOLN
40.0000 mg | Freq: Two times a day (BID) | INTRAMUSCULAR | Status: DC
  Administered 2022-05-03 – 2022-05-05 (×5): 40 mg via INTRAVENOUS
  Filled 2022-05-02 (×5): qty 4

## 2022-05-02 MED ORDER — MIDODRINE HCL 5 MG PO TABS
5.0000 mg | ORAL_TABLET | Freq: Three times a day (TID) | ORAL | Status: DC
  Administered 2022-05-02 – 2022-05-03 (×3): 5 mg via ORAL
  Filled 2022-05-02 (×3): qty 1

## 2022-05-02 MED ORDER — ENOXAPARIN SODIUM 40 MG/0.4ML IJ SOSY
40.0000 mg | PREFILLED_SYRINGE | INTRAMUSCULAR | Status: DC
  Administered 2022-05-04: 40 mg via SUBCUTANEOUS
  Filled 2022-05-02 (×2): qty 0.4

## 2022-05-02 NOTE — Progress Notes (Signed)
Progress Note   Patient: Leslie [name] [id number] DOB: [birth year] DOA: 04/22/2022     10 DOS: the patient was seen and examined on 05/02/2022   Brief hospital course: As per H&P written by Dr. Waldron Labs on 04/22/2022  Leslie [name]  is a 77 y.o. female, with medical history significant for hypertension, dyslipidemia, COPD with ongoing tobacco use, PAD/carotid artery disease, and hypertrophic cardiomyopathy with established chronic diastolic CHF  -Patient presents to ED secondary to complaints of shortness of breath, she does report progressive dyspnea and lower extremity edema over the last 29-month and admission 2021 for acute on chronic diastolic CHF, she reports she is compliant with her medications, she denies any increased salt intake, she is supposed to be on Lasix 20 mg oral daily but she herself increased to 40 mg oral daily, she denies fever, chills, chest pain, nausea or vomiting, she was seen by cardiology office today where she was noted to be hypoxic, so she was instructed to come to ED for further evaluation, she does not wear oxygen at home -In ED she was hypoxic 84%, and at 1 point she did drop to 79% on room air, currently requiring 2 L nasal cannula workup significant for elevated BNP at 2990, chest x-ray significant for cardiomegaly, vascular congestion and pulmonary edema with right pleural effusion, she received IV Lasix, and [facility] hospitalist consulted to admit.  Assessment and Plan: 1-acute respiratory failure with hypoxia -Patient oxygen saturation 84% on room air at time of admission; -Presumed secondary to CHF/volume overload 05/02/22  -Procalcitonin is not elevated -Hypoxia and dyspnea improving --down to  2 L/min -Less dyspnea on exertion (ambulated 200 feet) -BP improving okay to transition back to bolus Lasix from Lasix drip -Weight appears unchanged  2) large right-sided pleural effusion --- repeat chest x-ray 04/23/2022 shows persistent right-sided pleural  effusion -Ultrasound thoracentesis requested  3)-acute on chronic diastolic heart failure leading to #1 above -Echo with hyperdynamic function with EF greater than 75 patient with concerns for possible amyloid cardiomyopathy and grade 1 diastolic dysfunction -IV Lasix as above #1 -Daily weight and fluid input and output monitoring -Close monitoring of patient's renal function and electrolytes with repletion as needed. -Continue telemetry monitoring.  4)-HOCM/severe pulmonary hypertension/moderate to severe TR -Echo from 04/23/2022 with concerns for amyloid cardiomyopathy with severe concentric LVH and Mid-cavitary systolic obliteration of the LV cavity with mildly increased gradient noted  -Continue patient follow-up with cardiology service -iv Lasix  as above #1 -Continue metoprolol.  5-history of COPD -Continue bronchodilators  6-history of tobacco abuse -Cessation counseling provided -Continue nicotine patch.  7-peripheral arterial disease and coronary artery disease -Cessation counseling for smoking provided. -Continue aspirin, Pletal and statin -Continue outpatient follow-up with vascular service  8-hyperlipidemia -Continue statin. -Heart healthy diet discussed with patient.  9-hypertension -Blood pressure currently soft -Continue metoprolol and SR midodrine will continue diuresis. -Follow-up vital signs.  10-prediabetes -A1c 6.3 -Holding oral hypoglycemic agents while inpatient Use Novolog/Humalog Sliding scale insulin with Accu-Cheks/Fingersticks as ordered   11-dizziness -Continue as needed meclizine and the use of midodrine while providing diuresis. -Patient expressed no dizziness currently.  12-diarrhea -As needed Lomotil will be provided -Continue supportive care.   Subjective:  Hypoxia and dyspnea improving --down to  2 L/min -Less dyspnea on exertion (ambulated 200 feet) -Voiding better  No Nausea, Vomiting    Physical Exam: Vitals:   05/02/22  0416 05/02/22 0417 05/02/22 0755 05/02/22 1338  BP: (!) 144/99   (!) 155/77  Pulse: 77  77  80  Resp: 20  16 20  $ Temp: 97.7 F (36.5 C)   98.3 F (36.8 C)  TempSrc:      SpO2: 95% 95% 92% 95%  Weight:      Height:       Physical Exam  Gen:- Awake Alert, in no acute distress , significant dyspnea with minimal activity HEENT:- [place].AT, No sclera icterus Nose- [place] 2L/min Neck-Supple Neck, +ve JVD,.  Lungs-diminished breath sounds with bibasilar faint rales CV- S1, S2 normal, RRR, 3/6 SM Abd-  +ve B.Sounds, Abd Soft, No tenderness,    Extremity/Skin:-  +ve  edema,   good pedal pulses  Psych-affect is appropriate, oriented x3 Neuro-generalized weakness, no new focal deficits, no tremors  Family Communication: No Family at bedside today  Disposition: Anticipate discharge home with home health services Status is: Inpatient  Remains inpatient appropriate because: Continue IV diuresis as part of management of acute on chronic CHF.   Planned Discharge Destination: Home with home health services.   Author: Roxan Hockey, MD 05/02/2022 5:53 PM  For on call review [email or web address].

## 2022-05-02 NOTE — Progress Notes (Signed)
Mobility Specialist Progress Note:    05/02/22 1048  Mobility  Activity Ambulated with assistance in hallway  Level of Assistance Contact guard assist, steadying assist  Assistive Device None  Distance Ambulated (ft) 200 ft  Activity Response Tolerated well  Mobility Referral Yes  $Mobility charge 1 Mobility   Pt was agreeable to mobility session. Tolerated well, asx throughout. Unreliable SpO2 reading. No c/o of SOB or dizziness. Returned pt to chair with all needs met, call bell within reach.   Royetta Crochet Mobility Specialist Please contact via [facility] or  Rehab office at [phone]

## 2022-05-02 NOTE — Plan of Care (Addendum)
Pt alert and oriented x 4. Vitals stable. Pt received tylenol for headache. Edema present in BLE left more than right. Oxygen turned down to 1 L pt sating 92-93% 2 hours following decrease.  Problem: Education: Goal: Ability to demonstrate management of disease process will improve Outcome: Progressing Goal: Ability to verbalize understanding of medication therapies will improve Outcome: Progressing Goal: Individualized Educational Video(s) Outcome: Progressing   Problem: Activity: Goal: Capacity to carry out activities will improve Outcome: Progressing   Problem: Cardiac: Goal: Ability to achieve and maintain adequate cardiopulmonary perfusion will improve Outcome: Progressing   Problem: Education: Goal: Knowledge of General Education information will improve Description: Including pain rating scale, medication(s)/side effects and non-pharmacologic comfort measures Outcome: Progressing   Problem: Health Behavior/Discharge Planning: Goal: Ability to manage health-related needs will improve Outcome: Progressing   Problem: Clinical Measurements: Goal: Ability to maintain clinical measurements within normal limits will improve Outcome: Progressing Goal: Will remain free from infection Outcome: Progressing Goal: Diagnostic test results will improve Outcome: Progressing Goal: Respiratory complications will improve Outcome: Progressing Goal: Cardiovascular complication will be avoided Outcome: Progressing   Problem: Activity: Goal: Risk for activity intolerance will decrease Outcome: Progressing   Problem: Nutrition: Goal: Adequate nutrition will be maintained Outcome: Progressing   Problem: Coping: Goal: Level of anxiety will decrease Outcome: Progressing   Problem: Elimination: Goal: Will not experience complications related to bowel motility Outcome: Progressing Goal: Will not experience complications related to urinary retention Outcome: Progressing   Problem:  Pain Managment: Goal: General experience of comfort will improve Outcome: Progressing   Problem: Safety: Goal: Ability to remain free from injury will improve Outcome: Progressing   Problem: Skin Integrity: Goal: Risk for impaired skin integrity will decrease Outcome: Progressing   Problem: Education: Goal: Ability to describe self-care measures that may prevent or decrease complications (Diabetes Survival Skills Education) will improve Outcome: Progressing Goal: Individualized Educational Video(s) Outcome: Progressing   Problem: Coping: Goal: Ability to adjust to condition or change in health will improve Outcome: Progressing   Problem: Fluid Volume: Goal: Ability to maintain a balanced intake and output will improve Outcome: Progressing   Problem: Health Behavior/Discharge Planning: Goal: Ability to identify and utilize available resources and services will improve Outcome: Progressing Goal: Ability to manage health-related needs will improve Outcome: Progressing   Problem: Metabolic: Goal: Ability to maintain appropriate glucose levels will improve Outcome: Progressing   Problem: Nutritional: Goal: Maintenance of adequate nutrition will improve Outcome: Progressing Goal: Progress toward achieving an optimal weight will improve Outcome: Progressing   Problem: Skin Integrity: Goal: Risk for impaired skin integrity will decrease Outcome: Progressing   Problem: Tissue Perfusion: Goal: Adequacy of tissue perfusion will improve Outcome: Progressing

## 2022-05-03 ENCOUNTER — Inpatient Hospital Stay (HOSPITAL_COMMUNITY): Payer: Medicare HMO

## 2022-05-03 ENCOUNTER — Encounter (HOSPITAL_COMMUNITY): Payer: Self-pay | Admitting: Internal Medicine

## 2022-05-03 DIAGNOSIS — J9601 Acute respiratory failure with hypoxia: Secondary | ICD-10-CM | POA: Diagnosis not present

## 2022-05-03 LAB — BODY FLUID CELL COUNT WITH DIFFERENTIAL
Eos, Fluid: 0 %
Lymphs, Fluid: 40 %
Monocyte-Macrophage-Serous Fluid: 37 % — ABNORMAL LOW (ref 50–90)
Neutrophil Count, Fluid: 23 % (ref 0–25)
Total Nucleated Cell Count, Fluid: 380 uL (ref 0–1000)

## 2022-05-03 LAB — CBC
HCT: 41.4 % (ref 36.0–46.0)
Hemoglobin: 13.1 g/dL (ref 12.0–15.0)
MCH: 27.3 pg (ref 26.0–34.0)
MCHC: 31.6 g/dL (ref 30.0–36.0)
MCV: 86.3 fL (ref 80.0–100.0)
Platelets: 301 10*3/uL (ref 150–400)
RBC: 4.8 MIL/uL (ref 3.87–5.11)
RDW: 18.1 % — ABNORMAL HIGH (ref 11.5–15.5)
WBC: 6.9 10*3/uL (ref 4.0–10.5)
nRBC: 0 % (ref 0.0–0.2)

## 2022-05-03 LAB — RENAL FUNCTION PANEL
Albumin: 3.2 g/dL — ABNORMAL LOW (ref 3.5–5.0)
Anion gap: 13 (ref 5–15)
BUN: 45 mg/dL — ABNORMAL HIGH (ref 8–23)
CO2: 30 mmol/L (ref 22–32)
Calcium: 9 mg/dL (ref 8.9–10.3)
Chloride: 91 mmol/L — ABNORMAL LOW (ref 98–111)
Creatinine, Ser: 1.45 mg/dL — ABNORMAL HIGH (ref 0.44–1.00)
GFR, Estimated: 38 mL/min — ABNORMAL LOW (ref >60)
Glucose, Bld: 162 mg/dL — ABNORMAL HIGH (ref 70–99)
Phosphorus: 4.5 mg/dL (ref 2.5–4.6)
Potassium: 3.5 mmol/L (ref 3.5–5.1)
Sodium: 134 mmol/L — ABNORMAL LOW (ref 135–145)

## 2022-05-03 LAB — LACTATE DEHYDROGENASE, PLEURAL OR PERITONEAL FLUID: LD, Fluid: 58 U/L — ABNORMAL HIGH (ref 3–23)

## 2022-05-03 LAB — GLUCOSE, PLEURAL OR PERITONEAL FLUID: Glucose, Fluid: 203 mg/dL

## 2022-05-03 LAB — GRAM STAIN: Gram Stain: NONE SEEN

## 2022-05-03 LAB — LACTATE DEHYDROGENASE: LDH: 248 U/L — ABNORMAL HIGH (ref 98–192)

## 2022-05-03 MED ORDER — LIDOCAINE HCL (PF) 2 % IJ SOLN
INTRAMUSCULAR | Status: AC
  Administered 2022-05-03: 10 mL
  Filled 2022-05-03: qty 10

## 2022-05-03 MED ORDER — HYDROCORTISONE 1 % EX OINT
TOPICAL_OINTMENT | Freq: Two times a day (BID) | CUTANEOUS | Status: DC
  Filled 2022-05-03: qty 28.35

## 2022-05-03 MED ORDER — MIDODRINE HCL 5 MG PO TABS
10.0000 mg | ORAL_TABLET | Freq: Once | ORAL | Status: AC
  Administered 2022-05-03: 10 mg via ORAL
  Filled 2022-05-03: qty 2

## 2022-05-03 MED ORDER — ALBUMIN HUMAN 25 % IV SOLN
12.5000 g | Freq: Once | INTRAVENOUS | Status: AC
  Administered 2022-05-03: 12.5 g via INTRAVENOUS
  Filled 2022-05-03: qty 50

## 2022-05-03 MED ORDER — MIDODRINE HCL 5 MG PO TABS
10.0000 mg | ORAL_TABLET | Freq: Three times a day (TID) | ORAL | Status: DC
  Administered 2022-05-03 – 2022-05-05 (×7): 10 mg via ORAL
  Filled 2022-05-03 (×7): qty 2

## 2022-05-03 NOTE — Progress Notes (Signed)
Pt has redness to both hands and complaining of itching. MD notified and cream ordered and administered.

## 2022-05-03 NOTE — Progress Notes (Signed)
   05/03/22 1331  Vitals  Temp 98.1 F (36.7 C)  BP (!) 72/58  MAP (mmHg) (!) 64  Pulse Rate (!) 102  Resp 19  Level of Consciousness  Level of Consciousness Alert  MEWS COLOR  MEWS Score Color Yellow  Oxygen Therapy  SpO2 (!) 79 %  O2 Device Room Air  MEWS Score  MEWS Temp 0  MEWS Systolic 2  MEWS Pulse 1  MEWS RR 0  MEWS LOC 0  MEWS Score 3    Notified Dr. Joesph Fillers. Placed patient back on 2l/[place] which has resolved patient's "off" feeling and o2 now 94%. Patient's legs raised. New orders placed.

## 2022-05-03 NOTE — Plan of Care (Signed)
  Problem: Activity: Goal: Capacity to carry out activities will improve Outcome: Progressing   Problem: Cardiac: Goal: Ability to achieve and maintain adequate cardiopulmonary perfusion will improve Outcome: Progressing   

## 2022-05-03 NOTE — Progress Notes (Signed)
Mobility Specialist Progress Note:    05/03/22 0923  Mobility  Activity Ambulated with assistance in hallway  Level of Assistance Contact guard assist, steadying assist  Assistive Device None  Distance Ambulated (ft) 50 ft  Activity Response Tolerated well  Mobility Referral Yes  $Mobility charge 1 Mobility   Pt was agreeable to mobility session. Ambulated 20f in hallway, pt stated feeling weakness in Rt leg, deferred further mobility, returned pt to chair. Recorded BP (see below). Pt stated weakness in right leg may be d/t anxiety for upcoming procedure and requested for me to f/u later today to ambulate further in the hallway. Left pt in chair with all needs met.   Sitting BP: 88/60 (70) Sitting after 2 min: 106/66(78) Sitting after 5 min: 111/70 (83) SpO2 90% on 1LO2.  KRoyetta CrochetMobility Specialist Please contact via [facility]  Rehab office at [phone]

## 2022-05-03 NOTE — Progress Notes (Signed)
Progress Note   Patient: Leslie [name] [id number] DOB: [birth year] DOA: 04/22/2022     11 DOS: the patient was seen and examined on 05/03/2022   Brief hospital course: As per H&P written by Dr. Waldron Labs on 04/22/2022  Gilma Bias  is a 77 y.o. female, with medical history significant for hypertension, dyslipidemia, COPD with ongoing tobacco use, PAD/carotid artery disease, and hypertrophic cardiomyopathy with established chronic diastolic CHF  -Patient presents to ED secondary to complaints of shortness of breath, she does report progressive dyspnea and lower extremity edema over the last 60-month and admission 2021 for acute on chronic diastolic CHF, she reports she is compliant with her medications, she denies any increased salt intake, she is supposed to be on Lasix 20 mg oral daily but she herself increased to 40 mg oral daily, she denies fever, chills, chest pain, nausea or vomiting, she was seen by cardiology office today where she was noted to be hypoxic, so she was instructed to come to ED for further evaluation, she does not wear oxygen at home -In ED she was hypoxic 84%, and at 1 point she did drop to 79% on room air, currently requiring 2 L nasal cannula workup significant for elevated BNP at 2990, chest x-ray significant for cardiomegaly, vascular congestion and pulmonary edema with right pleural effusion, she received IV Lasix, and [facility] hospitalist consulted to admit.  Assessment and Plan: 1-acute respiratory failure with hypoxia -Patient oxygen saturation 84% on room air at time of admission; -Presumed secondary to CHF/volume overload -Procalcitonin is Not elevated -Currently on IV Lasix -05/03/22  -  Attempted to wean patient off oxygen O2 sats dropped to 79% on room air -After thoracentesis on on 05/03/2022 patient developed hypotension with  bp of 75/57 -Rechecked manually, 72/58. Placed her back on o2, 92% on 2l/[place]  -Weight trended down with diuresis -Chest x-ray without  pneumothorax postprocedure- -give p.o. midodrine and IV albumin for postprocedure hypotension  2)Large right-sided pleural effusion ---  -Status post thoracentesis on 05/03/2022 with removal of 600 mL of fluid with postprocedure hypotension and hypoxia as noted above #1 -Fluid analysis consistent with transudate based on LDH -Postprocedural chest x-ray on 05/03/2022 showed near complete resolution of right-sided pleural effusion and no pneumothorax  3)-acute on chronic diastolic heart failure leading to #1 above -Echo with hyperdynamic function with EF greater than 75 patient with concerns for possible amyloid cardiomyopathy and grade 1 diastolic dysfunction -IV Lasix as above #1 -Daily weight and fluid input and output monitoring -Close monitoring of patient's renal function and electrolytes with repletion as needed. -Continue telemetry monitoring. -Continue losartan and metoprolol as BP allows  4)-HOCM/severe pulmonary hypertension/moderate to severe TR -Echo from 04/23/2022 with concerns for amyloid cardiomyopathy with severe concentric LVH and Mid-cavitary systolic obliteration of the LV cavity with mildly increased gradient noted  -She will need outpatient follow-up cardiology team after discharge -iv Lasix  as above #1 -Continue metoprolol and losartan as BP allows  5-history of COPD -Continue bronchodilators  6-history of tobacco abuse -Cessation counseling provided -Continue nicotine patch.  7-peripheral arterial disease and coronary artery disease -Cessation counseling for smoking provided. -Continue aspirin, Pletal and statin -Continue outpatient follow-up with vascular service  8-hyperlipidemia -Continue statin. -Heart healthy diet discussed with patient.  9-hypertension -Blood pressure currently soft -Continue metoprolol and SR midodrine will continue diuresis. -Follow-up vital signs.  10-prediabetes -A1c 6.3 -Holding oral hypoglycemic agents while inpatient Use  Novolog/Humalog Sliding scale insulin with Accu-Cheks/Fingersticks as ordered   11-dizziness -Continue as  needed meclizine and the use of midodrine while providing diuresis. -Patient expressed no dizziness currently.  12-diarrhea -As needed Lomotil will be provided -Continue supportive care.   Subjective:  .  Attempted to wean patient off oxygen O2 sats dropped to 79% on room air -After thoracentesis on 230 2024 patient developed hypotension with  bp of 75/57 -Rechecked manually, 72/58.  Placed her back on o2, 92% on 2l/[place]   No Nausea, Vomiting  -   Physical Exam: Vitals:   05/03/22 1050 05/03/22 1255 05/03/22 1331 05/03/22 1346  BP: 130/73  (!) 72/58 (!) 118/93  Pulse: 84  (!) 102 84  Resp: 20  19 20  $ Temp:   98.1 F (36.7 C)   TempSrc:      SpO2: 95% 95% (!) 79% 98%  Weight:      Height:       Physical Exam  Gen:- Awake Alert, in no acute distress , significant dyspnea with minimal activity HEENT:- [place].AT, No sclera icterus Nose- [place] 2L/min Neck-Supple Neck, +ve JVD,.  Lungs-improving air movement, no wheezing  CV- S1, S2 normal, RRR, 3/6 SM Abd-  +ve B.Sounds, Abd Soft, No tenderness,    Extremity/Skin:-  +ve  edema,   good pedal pulses  Psych-affect is appropriate, oriented x3 Neuro-generalized weakness, no new focal deficits, no tremors  Family Communication: No Family at bedside today  Disposition: Anticipate discharge home with home health services Status is: Inpatient  Remains inpatient appropriate because: Continue IV diuresis as part of management of acute on chronic CHF.   Planned Discharge Destination: Home with home health services.   Author: Roxan Hockey, MD 05/03/2022 1:48 PM  For on call review [email or web address].

## 2022-05-03 NOTE — Progress Notes (Signed)
Patient slept most of the night. Now sitting up the chair. One prn medication given during this shift. Patient assisted the am with a bath and gown change. Plan of care ongoing.

## 2022-05-03 NOTE — Progress Notes (Signed)
Patient tolerated right sided thoracentesis procedure well today and 600 mL of amber colored pleural fluid removed and sent to lab for processing. Patient had post chest xray and then transported back to inpatient bed assignment via stretcher with no acute distress noted verbalizing understanding of post procedure instructions.

## 2022-05-04 DIAGNOSIS — I5033 Acute on chronic diastolic (congestive) heart failure: Secondary | ICD-10-CM | POA: Diagnosis not present

## 2022-05-04 DIAGNOSIS — J9601 Acute respiratory failure with hypoxia: Secondary | ICD-10-CM | POA: Diagnosis not present

## 2022-05-04 DIAGNOSIS — J449 Chronic obstructive pulmonary disease, unspecified: Secondary | ICD-10-CM | POA: Diagnosis not present

## 2022-05-04 LAB — RENAL FUNCTION PANEL
Albumin: 3.3 g/dL — ABNORMAL LOW (ref 3.5–5.0)
Anion gap: 12 (ref 5–15)
BUN: 50 mg/dL — ABNORMAL HIGH (ref 8–23)
CO2: 30 mmol/L (ref 22–32)
Calcium: 8.9 mg/dL (ref 8.9–10.3)
Chloride: 91 mmol/L — ABNORMAL LOW (ref 98–111)
Creatinine, Ser: 1.41 mg/dL — ABNORMAL HIGH (ref 0.44–1.00)
GFR, Estimated: 39 mL/min — ABNORMAL LOW (ref >60)
Glucose, Bld: 168 mg/dL — ABNORMAL HIGH (ref 70–99)
Phosphorus: 4.7 mg/dL — ABNORMAL HIGH (ref 2.5–4.6)
Potassium: 3.6 mmol/L (ref 3.5–5.1)
Sodium: 133 mmol/L — ABNORMAL LOW (ref 135–145)

## 2022-05-04 LAB — CBC
HCT: 42.2 % (ref 36.0–46.0)
Hemoglobin: 13.2 g/dL (ref 12.0–15.0)
MCH: 27.2 pg (ref 26.0–34.0)
MCHC: 31.3 g/dL (ref 30.0–36.0)
MCV: 86.8 fL (ref 80.0–100.0)
Platelets: 310 10*3/uL (ref 150–400)
RBC: 4.86 MIL/uL (ref 3.87–5.11)
RDW: 17.7 % — ABNORMAL HIGH (ref 11.5–15.5)
WBC: 7.2 10*3/uL (ref 4.0–10.5)
nRBC: 0 % (ref 0.0–0.2)

## 2022-05-04 LAB — GLUCOSE, CAPILLARY
Glucose-Capillary: 264 mg/dL — ABNORMAL HIGH (ref 70–99)
Glucose-Capillary: 218 mg/dL — ABNORMAL HIGH (ref 70–99)

## 2022-05-04 MED ORDER — INSULIN ASPART 100 UNIT/ML IJ SOLN
0.0000 [IU] | Freq: Three times a day (TID) | INTRAMUSCULAR | Status: DC
  Administered 2022-05-05: 1 [IU] via SUBCUTANEOUS
  Administered 2022-05-05: 7 [IU] via SUBCUTANEOUS

## 2022-05-04 NOTE — Progress Notes (Signed)
Progress Note   Patient: Leslie [name] [id number] DOB: 1945/11/23 DOA: 04/22/2022     12 DOS: the patient was seen and examined on 05/04/2022   Brief hospital course: As per H&P written by Dr. Waldron Labs on 04/22/2022  Leslie [name]  is a 77 y.o. female, with medical history significant for hypertension, dyslipidemia, COPD with ongoing tobacco use, PAD/carotid artery disease, and hypertrophic cardiomyopathy with established chronic diastolic CHF  -Patient presents to ED secondary to complaints of shortness of breath, she does report progressive dyspnea and lower extremity edema over the last 79-month and admission 2021 for acute on chronic diastolic CHF, she reports she is compliant with her medications, she denies any increased salt intake, she is supposed to be on Lasix 20 mg oral daily but she herself increased to 40 mg oral daily, she denies fever, chills, chest pain, nausea or vomiting, she was seen by cardiology office today where she was noted to be hypoxic, so she was instructed to come to ED for further evaluation, she does not wear oxygen at home -In ED she was hypoxic 84%, and at 1 point she did drop to 79% on room air, currently requiring 2 L nasal cannula workup significant for elevated BNP at 2990, chest x-ray significant for cardiomegaly, vascular congestion and pulmonary edema with right pleural effusion, she received IV Lasix, and [facility] hospitalist consulted to admit.  Assessment and Plan: 1-acute respiratory failure with hypoxia -Patient oxygen saturation 84% on room air at time of admission; -Presumed secondary to CHF/volume overload -Procalcitonin is Not elevated -Currently on IV Lasix -Attempted to wean patient off oxygen O2 sats dropped to 79% on room air -After thoracentesis on on 05/03/2022 patient developed hypotension with  bp of 75/57 -Rechecked manually, 72/58. Placed her back on o2, 92% on 2l/  -Weight trended down with diuresis -Chest x-ray without pneumothorax  postprocedure-  2)Large right-sided pleural effusion ---  -Status post thoracentesis on 05/03/2022 with removal of 600 mL of fluid with postprocedure hypotension and hypoxia as noted above #1 -Fluid analysis consistent with transudate based on LDH -Postprocedural chest x-ray on 05/03/2022 showed near complete resolution of right-sided pleural effusion and no pneumothorax  3)-acute on chronic diastolic heart failure leading to #1 above -Echo with hyperdynamic function with EF greater than 75 patient with concerns for possible amyloid cardiomyopathy and grade 1 diastolic dysfunction -IV Lasix as above #1 -Daily weight and fluid input and output monitoring -Close monitoring of patient's renal function and electrolytes with repletion as needed. -Continue telemetry monitoring. -Continue losartan and metoprolol as BP allows  4)-HOCM/severe pulmonary hypertension/moderate to severe TR -Echo from 04/23/2022 with concerns for amyloid cardiomyopathy with severe concentric LVH and Mid-cavitary systolic obliteration of the LV cavity with mildly increased gradient noted  -She will need outpatient follow-up cardiology team after discharge -iv Lasix  as above #1 -Continue metoprolol and losartan as BP allows  5-history of COPD -Continue bronchodilators  6-history of tobacco abuse -Cessation counseling provided -Continue nicotine patch.  7-peripheral arterial disease and coronary artery disease -Cessation counseling for smoking provided. -Continue aspirin, Pletal and statin -Continue outpatient follow-up with vascular service  8-hyperlipidemia -Continue statin. -Heart healthy diet discussed with patient.  9-hypertension -Blood pressure currently soft -Continue metoprolol and SR midodrine will continue diuresis. -Follow-up vital signs.  10-prediabetes -A1c 6.3 -Holding oral hypoglycemic agents while inpatient  11-dizziness -Continue as needed meclizine and the use of midodrine while  providing diuresis. -Patient expressed no dizziness currently.  12-diarrhea - resolved -As needed Lomotil will  be provided -Continue supportive care.   Subjective:  Still SOB with ambulating.  Coughing and chest congestion still present.   Physical Exam: Vitals:   05/04/22 0509 05/04/22 0700 05/04/22 0804 05/04/22 1234  BP:  97/68  (!) 111/54  Pulse: 73 75  62  Resp:  15  20  Temp:  98 F (36.7 C)  97.9 F (36.6 C)  TempSrc:  Oral  Oral  SpO2: 100% (!) 86% 96% 98%  Weight: 59.6 kg     Height:       Physical Exam  Gen:- Awake Alert, in no acute distress , significant dyspnea with minimal activity HEENT:- [place].AT, No sclera icterus Nose- [place] 2L/min Neck-Supple Neck, +ve JVD,.  Lungs-diminished BS with expiratory wheezing noted  CV- S1, S2 normal, RRR, 3/6 SM Abd-  +ve B.Sounds, Abd Soft, No tenderness,    Extremity/Skin:-  +ve  edema,   good pedal pulses  Psych-affect is appropriate, oriented x3 Neuro-generalized weakness, no new focal deficits, no tremors  Family Communication: No Family at bedside today  Disposition: Anticipate discharge home with home health services Status is: Inpatient  Remains inpatient appropriate because: Continue IV diuresis as part of management of acute on chronic CHF.   Planned Discharge Destination: Home with home health services.   Author: Irwin Brakeman, MD 05/04/2022 5:10 PM  For on call review [email or web address].

## 2022-05-04 NOTE — Progress Notes (Signed)
Mobility Specialist Progress Note:    05/04/22 0930  Mobility  Activity Ambulated with assistance in hallway  Level of Assistance Contact guard assist, steadying assist  Assistive Device None  Distance Ambulated (ft) 400 ft  Activity Response Tolerated well  Mobility Referral Yes  $Mobility charge 1 Mobility   Pt was agreeable to mobility session. Pt can ambulate independently, CGA for safety. Tolerated well, asx throughout session, despite SpO2 81% on 2L. No c/o of weakness, dizziness, or SOB at any point during session. Pt states SpO2 of low 80's has "always been normal for her". Returned pt to chair, SpO2 recovered to 94%. BP stats below.   Post Mobility, Sitting in Chair: 87/59 After 5 min Sitting in Chair: [place] Specialist Please contact via [place] or  Rehab office at [phone]

## 2022-05-04 NOTE — Evaluation (Signed)
Physical Therapy Evaluation Patient Details Name: JOHNINE ARENSDORF MRN: [id number] DOB: 04/02/1945 Today's Date: 05/04/2022  History of Present Illness  Wylie Griscom  is a 77 y.o. female, with medical history significant for hypertension, dyslipidemia, COPD with ongoing tobacco use, PAD/carotid artery disease, and hypertrophic cardiomyopathy with established chronic diastolic CHF   -Patient presents to ED secondary to complaints of shortness of breath, she does report progressive dyspnea and lower extremity edema over the last 41-month and admission 2021 for acute on chronic diastolic CHF, she reports she is compliant with her medications, she denies any increased salt intake, she is supposed to be on Lasix 20 mg oral daily but she herself increased to 40 mg oral daily, she denies fever, chills, chest pain, nausea or vomiting, she was seen by cardiology office today where she was noted to be hypoxic, so she was instructed to come to ED for further evaluation, she does not wear oxygen at home  -In ED she was hypoxic 84%, and at 1 point she did drop to 79% on room air, currently requiring 2 L nasal cannula workup significant for elevated BNP at 2990, chest x-ray significant for cardiomegaly, vascular congestion and pulmonary edema with right pleural effusion, she received IV Lasix, and [facility] hospitalist consulted to admit.   Clinical Impression  Patient functioning near baseline for functional mobility and gait other than SpO2 dropping from 95% to 84% during ambulation while on room air, put on 2 LPM with SpO2 at 88-90% and after resting in chair SpO2 increased above 93% - nurse notified.  Patient encouraged to ambulate ad lib in room and with nursing staff supervising in hallway.  Plan:  Patient discharged from physical therapy to care of nursing for ambulation daily as tolerated for length of stay.         Recommendations for follow up therapy are one component of a multi-disciplinary discharge planning  process, led by the attending physician.  Recommendations may be updated based on patient status, additional functional criteria and insurance authorization.  Follow Up Recommendations No PT follow up      Assistance Recommended at Discharge PRN  Patient can return home with the following  Other (comment) (near baseline)    Equipment Recommendations None recommended by PT  Recommendations for Other Services       Functional Status Assessment Patient has had a recent decline in their functional status and/or demonstrates limited ability to make significant improvements in function in a reasonable and predictable amount of time     Precautions / Restrictions Precautions Precautions: None Restrictions Weight Bearing Restrictions: No      Mobility  Bed Mobility Overal bed mobility: Modified Independent                  Transfers Overall transfer level: Modified independent                      Ambulation/Gait Ambulation/Gait assistance: Modified independent (Device/Increase time) Gait Distance (Feet): 150 Feet Assistive device: None Gait Pattern/deviations: WFL(Within Functional Limits) Gait velocity: decreased     General Gait Details: grossly WFL with good return for ambulation in room and hallways without loss of balance, on room air with SpO2 dropping from 95% to 84%, put back on 2 LPM with SpO2 increasing to 88%  Stairs            Wheelchair Mobility    Modified Rankin (Stroke Patients Only)       Balance Overall balance assessment:  No apparent balance deficits (not formally assessed)                                           Pertinent Vitals/Pain Pain Assessment Pain Assessment: No/denies pain    Home Living Family/patient expects to be discharged to:: Private residence Living Arrangements: Alone Available Help at Discharge: Family;Available 24 hours/day Type of Home: Apartment Home Access: Level entry        Home Layout: One level Home Equipment: Cane - single point      Prior Function Prior Level of Function : Independent/Modified Independent             Mobility Comments: Community ambulation without AD, drives ADLs Comments: Independent     Hand Dominance        Extremity/Trunk Assessment   Upper Extremity Assessment Upper Extremity Assessment: Overall WFL for tasks assessed    Lower Extremity Assessment Lower Extremity Assessment: Overall WFL for tasks assessed    Cervical / Trunk Assessment Cervical / Trunk Assessment: Normal  Communication   Communication: No difficulties  Cognition Arousal/Alertness: Awake/alert Behavior During Therapy: WFL for tasks assessed/performed Overall Cognitive Status: Within Functional Limits for tasks assessed                                          General Comments      Exercises     Assessment/Plan    PT Assessment Patient does not need any further PT services  PT Problem List         PT Treatment Interventions      PT Goals (Current goals can be found in the Care Plan section)  Acute Rehab PT Goals Patient Stated Goal: return home with family to assist PT Goal Formulation: With patient/family Time For Goal Achievement: 05/04/22 Potential to Achieve Goals: Good    Frequency       Co-evaluation               AM-PAC PT "6 Clicks" Mobility  Outcome Measure Help needed turning from your back to your side while in a flat bed without using bedrails?: None Help needed moving from lying on your back to sitting on the side of a flat bed without using bedrails?: None Help needed moving to and from a bed to a chair (including a wheelchair)?: None Help needed standing up from a chair using your arms (e.g., wheelchair or bedside chair)?: None Help needed to walk in hospital room?: None Help needed climbing 3-5 steps with a railing? : None 6 Click Score: 24    End of Session Equipment Utilized  During Treatment: Oxygen Activity Tolerance: Patient tolerated treatment well;Patient limited by fatigue Patient left: in chair;with call bell/phone within reach;with family/visitor present Nurse Communication: Mobility status PT Visit Diagnosis: Unsteadiness on feet (R26.81);Other abnormalities of gait and mobility (R26.89);Muscle weakness (generalized) (M62.81)    Time: [id number] PT Time Calculation (min) (ACUTE ONLY): 33 min   Charges:   PT Evaluation $PT Eval Moderate Complexity: 1 Mod PT Treatments $Therapeutic Activity: 23-37 mins        3:09 PM, 05/04/22 Lonell Grandchild, MPT Physical Therapist with [facility] 336 [phone] office [phone] mobile phone

## 2022-05-05 DIAGNOSIS — I5033 Acute on chronic diastolic (congestive) heart failure: Secondary | ICD-10-CM | POA: Diagnosis not present

## 2022-05-05 DIAGNOSIS — J449 Chronic obstructive pulmonary disease, unspecified: Secondary | ICD-10-CM | POA: Diagnosis not present

## 2022-05-05 DIAGNOSIS — J9601 Acute respiratory failure with hypoxia: Secondary | ICD-10-CM | POA: Diagnosis not present

## 2022-05-05 LAB — GLUCOSE, CAPILLARY
Glucose-Capillary: 184 mg/dL — ABNORMAL HIGH (ref 70–99)
Glucose-Capillary: 189 mg/dL — ABNORMAL HIGH (ref 70–99)
Glucose-Capillary: 303 mg/dL — ABNORMAL HIGH (ref 70–99)
Glucose-Capillary: 133 mg/dL — ABNORMAL HIGH (ref 70–99)

## 2022-05-05 MED ORDER — FUROSEMIDE 40 MG PO TABS: 40.0000 mg | ORAL_TABLET | Freq: Every day | ORAL | 1 refills | Status: AC

## 2022-05-05 MED ORDER — POTASSIUM CHLORIDE CRYS ER 10 MEQ PO TBCR: 10.0000 meq | EXTENDED_RELEASE_TABLET | Freq: Every day | ORAL | 1 refills | Status: AC

## 2022-05-05 MED ORDER — DIPHENHYDRAMINE HCL 50 MG/ML IJ SOLN
25.0000 mg | Freq: Once | INTRAMUSCULAR | Status: AC
  Administered 2022-05-05: 25 mg via INTRAVENOUS
  Filled 2022-05-05: qty 1

## 2022-05-05 MED ORDER — IPRATROPIUM-ALBUTEROL 0.5-2.5 (3) MG/3ML IN SOLN: 3.0000 mL | RESPIRATORY_TRACT | 2 refills | Status: AC | PRN

## 2022-05-05 MED ORDER — LOSARTAN POTASSIUM 25 MG PO TABS: 25.0000 mg | ORAL_TABLET | Freq: Every day | ORAL | 1 refills | Status: AC

## 2022-05-05 MED ORDER — PREDNISONE 20 MG PO TABS: ORAL_TABLET | ORAL | 0 refills | Status: AC

## 2022-05-05 MED ORDER — ACETAMINOPHEN 325 MG PO TABS: 650.0000 mg | ORAL_TABLET | Freq: Four times a day (QID) | ORAL | Status: AC | PRN

## 2022-05-05 NOTE — Discharge Summary (Signed)
Physician Discharge Summary  Leslie [name] [id number] DOB: [birth year] DOA: 04/22/2022  PCP: [name], FNP Pulmonary: Dr. Melvyn Novas Cardiology: [facility]  Admit date: 04/22/2022 Discharge date: 05/05/2022  Admitted From:  Home  Disposition: Home   Recommendations for Outpatient Follow-up:  Follow up with PCP in 1 weeks Follow up with pulmonologist in 2 weeks Follow up with cardiologist in 2 weeks  Please obtain BMP/CBC in 1-2 weeks to follow up renal function/electrolytes Absolute Tobacco Cessation Strongly Advised    Discharge Condition: STABLE   CODE STATUS: FULL DIET:  regular   Brief Hospitalization Summary: Please see all hospital notes, images, labs for full details of the hospitalization. ADMISSION HPI:  Leslie [name]  is a 77 y.o. female, with medical history significant for hypertension, dyslipidemia, COPD with ongoing tobacco use, PAD/carotid artery disease, and hypertrophic cardiomyopathy with established chronic diastolic CHF  -Patient presents to ED secondary to complaints of shortness of breath, she does report progressive dyspnea and lower extremity edema over the last 68-month and admission 2021 for acute on chronic diastolic CHF, she reports she is compliant with her medications, she denies any increased salt intake, she is supposed to be on Lasix 20 mg oral daily but she herself increased to 40 mg oral daily, she denies fever, chills, chest pain, nausea or vomiting, she was seen by cardiology office today where she was noted to be hypoxic, so she was instructed to come to ED for further evaluation, she does not wear oxygen at home -In ED she was hypoxic 84%, and at 1 point she did drop to 79% on room air, currently requiring 2 L nasal cannula workup significant for elevated BNP at 2990, chest x-ray significant for cardiomegaly, vascular congestion and pulmonary edema with right pleural effusion, she received IV Lasix, and [facility] hospitalist consulted to admit.  HOSPITAL  COURSE BY PROBLEM LIST   1-acute respiratory failure with hypoxia -Patient oxygen saturation 84% on room air at time of admission; -Presumed secondary to CHF/volume overload -Procalcitonin is Not elevated -treated with IV Lasix -Attempted to wean patient off oxygen O2 sats dropped to 79% on room air -After thoracentesis on on 05/03/2022 patient developed hypotension with  bp of 75/57 -Rechecked manually, 72/58. Placed her back on o2, 92% on 2l/[place]  -Weight trended down with diuresis -Chest x-ray without pneumothorax postprocedure- -Pt will discharge home on 2L/min supplemental oxygen with order for concentrator   2)Large right-sided pleural effusion ---  -Status post thoracentesis on 05/03/2022 with removal of 600 mL of fluid with postprocedure hypotension and hypoxia as noted above #1 -Fluid analysis consistent with transudate based on LDH -Postprocedural chest x-ray on 05/03/2022 showed near complete resolution of right-sided pleural effusion and no pneumothorax   3)-acute on chronic diastolic heart failure leading to #1 above -Echo with hyperdynamic function with EF greater than 75 patient with concerns for possible amyloid cardiomyopathy and grade 1 diastolic dysfunction -IV Lasix as above #1 with good diuresis results  -Daily weight and fluid input and output monitoring - Pt has diuresed more than 9L since admission -Close monitoring of patient's renal function and electrolytes with repletion as needed. -Continue telemetry monitoring. -Continue losartan and metoprolol as BP allows -DC home on oral lasix 40 mg daily with potassium supplement    4)-HOCM/severe pulmonary hypertension/moderate to severe TR -Echo from 04/23/2022 with concerns for amyloid cardiomyopathy with severe concentric LVH and Mid-cavitary systolic obliteration of the LV cavity with mildly increased gradient noted  -She will need outpatient follow-up cardiology team  after discharge -iv Lasix  as above #1 -Continue  metoprolol and losartan -Ambulatory referral made for outpatient follow up with [facility]   5-history of COPD -Continue bronchodilators   6-history of tobacco abuse -Cessation counseling provided -Treated with nicotine patch in hospital  -Pt strongly advised to stop all tobacco usage    7-peripheral arterial disease and coronary artery disease -Cessation counseling for smoking provided. -Continue aspirin, Pletal and statin -Continue outpatient follow-up with vascular service   8-hyperlipidemia -Continue statin.   9-hypertension -Blood pressures have rebounded  -Continue metoprolol and lowered dose of losartan 25 mg daily -Follow-up with PCP and cardiology    10-prediabetes -A1c 6.3 -recommend close follow up with PCP   11-dizziness - RESOLVED  -Continue as needed meclizine and the use of midodrine while providing diuresis. -Patient expressed no dizziness currently.   12-diarrhea - resolved -As needed Lomotil will be provided -Continue supportive care.   Discharge Diagnoses:  Principal Problem:   Acute respiratory failure with hypoxia (HCC) Active Problems:   Arthritis, rheumatoid (HCC)   COPD (chronic obstructive pulmonary disease) (HCC)   Acute on chronic diastolic HF (heart failure) ([place])   Diabetes mellitus ([place])   Discharge Instructions: Discharge Instructions     Ambulatory referral to Cardiology   Complete by: As directed    Hospital Follow up for heart valve disease   Ambulatory referral to Pulmonology   Complete by: As directed    Hospital Follow up   Reason for referral: Asthma/COPD      Allergies as of 05/05/2022       Reactions   Latex Other (See Comments)   Burns skin    Tape Other (See Comments), Itching   Burns skin    Atorvastatin Other (See Comments)   myalgia myalgia        Medication List     STOP taking these medications    ibuprofen 800 MG tablet Commonly known as: ADVIL   metFORMIN 500 MG tablet Commonly known  as: GLUCOPHAGE       TAKE these medications    acetaminophen 325 MG tablet Commonly known as: TYLENOL Take 2 tablets (650 mg total) by mouth every 6 (six) hours as needed for mild pain or moderate pain (or Fever >/= 101).   albuterol 108 (90 Base) MCG/ACT inhaler Commonly known as: VENTOLIN HFA Inhale 2 puffs into the lungs every 6 (six) hours as needed for wheezing or shortness of breath. INHALE 2 INHALATIONS INTO THE LUNGS EVERY 6 HOURS AS NEEDED FOR WHEEZING Strength: 108 (90 Base) MCG/ACT What changed: Another medication with the same name was removed. Continue taking this medication, and follow the directions you see here.   Artificial Tears ophthalmic solution Place 2 drops into both eyes as needed.   aspirin EC 81 MG tablet Take 81 mg by mouth daily.   cilostazol 100 MG tablet Commonly known as: PLETAL TAKE (1) TABLET BY MOUTH TWICE DAILY BEFORE MEALS. What changed: See the new instructions.   furosemide 40 MG tablet Commonly known as: LASIX Take 1 tablet (40 mg total) by mouth daily. Start taking on: May 06, 2022 What changed:  medication strength how much to take when to take this reasons to take this   ipratropium-albuterol 0.5-2.5 (3) MG/3ML Soln Commonly known as: DUONEB Take 3 mLs by nebulization every 4 (four) hours as needed (wheezing, coughing, SOB).   losartan 25 MG tablet Commonly known as: COZAAR Take 1 tablet (25 mg total) by mouth daily. What changed:  medication  strength See the new instructions.   metoprolol succinate 25 MG 24 hr tablet Commonly known as: TOPROL-XL Take 1 tablet (25 mg total) by mouth daily.   potassium chloride 10 MEQ tablet Commonly known as: KLOR-CON M Take 1 tablet (10 mEq total) by mouth daily.   predniSONE 20 MG tablet Commonly known as: DELTASONE Take 3 PO QAM x5days, 2 PO QAM x5days, 1 PO QAM x5days Start taking on: May 06, 2022   rosuvastatin 10 MG tablet Commonly known as: CRESTOR TAKE 1 TABLET  BY MOUTH ONCE A DAY.   Symbicort 80-4.5 MCG/ACT inhaler Generic drug: budesonide-formoterol INHALE 2 PUFFS TWICE DAILY               Durable Medical Equipment  (From admission, onward)           Start     Ordered   05/05/22 1015  For home use only DME oxygen  Once       Comments: portable concentrator evaluation  Question Answer Comment  Length of Need Lifetime   Mode or (Route) Nasal cannula   Liters per Minute 2   Frequency Continuous (stationary and portable oxygen unit needed)   Oxygen conserving device Yes   Oxygen delivery system Gas      05/05/22 1014            Follow-up Information     [facility] [place] Follow up.   Specialty: Home Health Services Why: Will contact you to schedule home health visits. Contact information: [facility] [address] [phone]         [facility]. Schedule an appointment as soon as possible for a visit in 2 week(s).   Specialty: Cardiology Why: Hospital Follow Up Contact information: [address] [id number] [facility] [place]        [facility]. Schedule an appointment as soon as possible for a visit in 2 week(s).   Specialty: Pulmonology Why: Hospital Follow Up Contact information: [address] [place] [phone]               Allergies  Allergen Reactions   Latex Other (See Comments)    Burns skin    Tape Other (See Comments) and Itching    Burns skin    Atorvastatin Other (See Comments)    myalgia myalgia   Allergies as of 05/05/2022       Reactions   Latex Other (See Comments)   Burns skin    Tape Other (See Comments), Itching   Burns skin    Atorvastatin Other (See Comments)   myalgia myalgia        Medication List     STOP taking these medications    ibuprofen 800 MG tablet Commonly known as: ADVIL   metFORMIN 500 MG  tablet Commonly known as: GLUCOPHAGE       TAKE these medications    acetaminophen 325 MG tablet Commonly known as: TYLENOL Take 2 tablets (650 mg total) by mouth every 6 (six) hours as needed for mild pain or moderate pain (or Fever >/= 101).   albuterol 108 (90 Base) MCG/ACT inhaler Commonly known as: VENTOLIN HFA Inhale 2 puffs into the lungs every 6 (six) hours as needed for wheezing or shortness of breath. INHALE 2 INHALATIONS INTO THE LUNGS EVERY 6 HOURS AS NEEDED FOR WHEEZING Strength: 108 (90 Base) MCG/ACT What changed: Another medication with the  same name was removed. Continue taking this medication, and follow the directions you see here.   Artificial Tears ophthalmic solution Place 2 drops into both eyes as needed.   aspirin EC 81 MG tablet Take 81 mg by mouth daily.   cilostazol 100 MG tablet Commonly known as: PLETAL TAKE (1) TABLET BY MOUTH TWICE DAILY BEFORE MEALS. What changed: See the new instructions.   furosemide 40 MG tablet Commonly known as: LASIX Take 1 tablet (40 mg total) by mouth daily. Start taking on: May 06, 2022 What changed:  medication strength how much to take when to take this reasons to take this   ipratropium-albuterol 0.5-2.5 (3) MG/3ML Soln Commonly known as: DUONEB Take 3 mLs by nebulization every 4 (four) hours as needed (wheezing, coughing, SOB).   losartan 25 MG tablet Commonly known as: COZAAR Take 1 tablet (25 mg total) by mouth daily. What changed:  medication strength See the new instructions.   metoprolol succinate 25 MG 24 hr tablet Commonly known as: TOPROL-XL Take 1 tablet (25 mg total) by mouth daily.   potassium chloride 10 MEQ tablet Commonly known as: KLOR-CON M Take 1 tablet (10 mEq total) by mouth daily.   predniSONE 20 MG tablet Commonly known as: DELTASONE Take 3 PO QAM x5days, 2 PO QAM x5days, 1 PO QAM x5days Start taking on: May 06, 2022   rosuvastatin 10 MG tablet Commonly known as:  CRESTOR TAKE 1 TABLET BY MOUTH ONCE A DAY.   Symbicort 80-4.5 MCG/ACT inhaler Generic drug: budesonide-formoterol INHALE 2 PUFFS TWICE DAILY               Durable Medical Equipment  (From admission, onward)           Start     Ordered   05/05/22 1015  For home use only DME oxygen  Once       Comments: portable concentrator evaluation  Question Answer Comment  Length of Need Lifetime   Mode or (Route) Nasal cannula   Liters per Minute 2   Frequency Continuous (stationary and portable oxygen unit needed)   Oxygen conserving device Yes   Oxygen delivery system Gas      05/05/22 1014            Procedures/Studies: DG CHEST PORT 1 VIEW  Result Date: 05/03/2022 CLINICAL DATA:  [id number] Dyspnea and respiratory abnormalities [id number] EXAM: PORTABLE CHEST 1 VIEW COMPARISON:  Radiograph 05/03/2022 FINDINGS: Unchanged cardiomegaly. There are diffuse interstitial opacities. Probable trace residual right pleural effusion. Skin folds overlie the upper chest bilaterally. No evidence of pneumothorax. Bones are unchanged. IMPRESSION: Cardiomegaly with mild pulmonary edema and trace residual right pleural effusion. Electronically Signed   By: Maurine Simmering M.D.   On: 05/03/2022 14:15   DG Chest Port 1 View  Result Date: 05/03/2022 CLINICAL DATA:  Status post thoracentesis. EXAM: PORTABLE CHEST 1 VIEW COMPARISON:  Chest x-ray dated 05/02/2022. FINDINGS: Significantly improved aeration at the RIGHT lung base status post thoracentesis. No residual pleural effusion is seen. No pneumothorax is seen. Stable cardiomegaly. IMPRESSION: Significantly improved aeration at the RIGHT lung base status post thoracentesis. No residual pleural effusion is seen. No pneumothorax. Electronically Signed   By: Franki Cabot M.D.   On: 05/03/2022 10:56   US THORACENTESIS ASP PLEURAL SPACE W/IMG GUIDE  Result Date: 05/03/2022 INDICATION: Dyspnea, pleural effusion. EXAM: ULTRASOUND GUIDED THORACENTESIS  MEDICATIONS: None. COMPLICATIONS: None immediate. PROCEDURE: An ultrasound guided thoracentesis was thoroughly discussed with the patient and questions answered. The benefits,  risks, alternatives and complications were also discussed. The patient understands and wishes to proceed with the procedure. Written consent was obtained. Ultrasound was performed to localize and mark an adequate pocket of fluid in the RIGHT chest. The area was then prepped and draped in the normal sterile fashion. 1% Lidocaine was used for local anesthesia. Under ultrasound guidance a 19 gauge, 7-cm, Yueh catheter was introduced. Thoracentesis was performed. The catheter was removed and a dressing applied. FINDINGS: A total of approximately 600 cc of yellowish fluid was removed. Samples were sent to the laboratory as requested by the clinical team. IMPRESSION: Successful ultrasound guided right thoracentesis yielding 600 cc of pleural fluid. Electronically Signed   By: Franki Cabot M.D.   On: 05/03/2022 10:55   DG CHEST PORT 1 VIEW  Result Date: 05/02/2022 CLINICAL DATA:  Shortness of breath. EXAM: PORTABLE CHEST 1 VIEW COMPARISON:  04/30/2022. FINDINGS: Trachea is midline. Heart is enlarged. Interstitial prominence and indistinctness bilaterally, similar to minimally improved from 04/30/2022. Moderate right pleural effusion with collapse/consolidation in the right lung base. Tiny left pleural effusion. IMPRESSION: 1. Congestive heart failure. 2. Moderate right pleural effusion with right basilar collapse/consolidation. Difficult to exclude pneumonia. Electronically Signed   By: Lorin Picket M.D.   On: 05/02/2022 08:07   DG Chest 2 View  Result Date: 04/30/2022 CLINICAL DATA:  Dyspnea EXAM: CHEST - 2 VIEW COMPARISON:  04/22/2022 FINDINGS: Bilateral interstitial and alveolar airspace opacities. Bilateral small pleural effusions, right greater than left. No pneumothorax. Stable cardiomegaly. No acute osseous abnormality. IMPRESSION:  1. Mild worsening pulmonary edema versus multilobar pneumonia. Electronically Signed   By: Kathreen Devoid M.D.   On: 04/30/2022 09:20   ECHOCARDIOGRAM COMPLETE  Result Date: 04/23/2022    ECHOCARDIOGRAM REPORT   Patient Name:   STEFAN BLEHM Date of Exam: 04/23/2022 Medical Rec #:  [id number]    Height:       67.0 in Accession #:    [id number]   Weight:       147.5 lb Date of Birth:  12-01-1945     BSA:          1.777 m Patient Age:    20 years     BP:           142/88 mmHg Patient Gender: F            HR:           72 bpm. Exam Location:  [facility] Procedure: 2D Echo, Cardiac Doppler and Color Doppler Indications:    CHF  History:        Patient has prior history of Echocardiogram examinations, most                 recent 03/08/2020. CHF, COPD,                 Signs/Symptoms:Dizziness/Lightheadedness; Risk Factors:Current                 Smoker, Hypertension, Diabetes and Dyslipidemia.  Sonographer:    Wenda Low Referring Phys: [place]  1. Consider evaluation for amyloid cardiomyopathy. Mid-cavitary systolic obliteration of the LV cavity with mildly increased gradient noted. Left ventricular ejection fraction, by estimation, is >75%. The left ventricle has hyperdynamic function. The left ventricle has no regional wall motion abnormalities. There is severe concentric left ventricular hypertrophy. Left ventricular diastolic parameters are consistent with Grade I diastolic dysfunction (impaired relaxation). Elevated left ventricular end-diastolic pressure.  2. Right ventricular systolic  function is low normal. The right ventricular size is normal. There is severely elevated pulmonary artery systolic pressure. The estimated right ventricular systolic pressure is 0000000 mmHg.  3. Left atrial size was moderately dilated.  4. Right atrial size was mildly dilated.  5. Trivial pericardial effusion. Cannot evaluate for tamponade given severe pulmonary hypertension.  6. The mitral valve is  abnormal. Mild mitral valve regurgitation. Moderate mitral annular calcification.  7. The tricuspid valve is abnormal. Tricuspid valve regurgitation is moderate to severe.  8. The aortic valve is tricuspid. There is moderate calcification of the aortic valve. Aortic valve regurgitation is not visualized. Aortic valve sclerosis/calcification is present, without any evidence of aortic stenosis. Aortic valve mean gradient measures 7.0 mmHg.  9. The inferior vena cava is dilated in size with <50% respiratory variability, suggesting right atrial pressure of 15 mmHg. Comparison(s): Changes from prior study are noted. 03/08/2020: LVEF 70-75%, RVSP 77 mmHg. FINDINGS  Left Ventricle: Consider evaluation for amyloid cardiomyopathy. Mid-cavitary systolic obliteration of the LV cavity with mildly increased gradient noted. Left ventricular ejection fraction, by estimation, is >75%. The left ventricle has hyperdynamic function. The left ventricle has no regional wall motion abnormalities. The left ventricular internal cavity size was small. There is severe concentric left ventricular hypertrophy. Left ventricular diastolic parameters are consistent with Grade I diastolic dysfunction (impaired relaxation). Elevated left ventricular end-diastolic pressure. Right Ventricle: The right ventricular size is normal. No increase in right ventricular wall thickness. Right ventricular systolic function is low normal. There is severely elevated pulmonary artery systolic pressure. The tricuspid regurgitant velocity is 4.32 m/s, and with an assumed right atrial pressure of 15 mmHg, the estimated right ventricular systolic pressure is 0000000 mmHg. Left Atrium: Left atrial size was moderately dilated. Right Atrium: Right atrial size was mildly dilated. Pericardium: Trivial pericardial effusion. Cannot evaluate for tamponade given severe pulmonary hypertension. Trivial pericardial effusion is present. The pericardial effusion is circumferential.  Mitral Valve: The mitral valve is abnormal. Moderate mitral annular calcification. Mild mitral valve regurgitation. MV peak gradient, 10.6 mmHg. The mean mitral valve gradient is 4.0 mmHg. Tricuspid Valve: The tricuspid valve is abnormal. Tricuspid valve regurgitation is moderate to severe. Aortic Valve: The aortic valve is tricuspid. There is moderate calcification of the aortic valve. Aortic valve regurgitation is not visualized. Aortic valve sclerosis/calcification is present, without any evidence of aortic stenosis. Aortic valve mean gradient measures 7.0 mmHg. Aortic valve peak gradient measures 14.4 mmHg. Aortic valve area, by VTI measures 1.86 cm. Pulmonic Valve: The pulmonic valve was grossly normal. Pulmonic valve regurgitation is trivial. Aorta: The aortic root and ascending aorta are structurally normal, with no evidence of dilitation. Venous: The inferior vena cava is dilated in size with less than 50% respiratory variability, suggesting right atrial pressure of 15 mmHg. IAS/Shunts: No atrial level shunt detected by color flow Doppler.  LEFT VENTRICLE PLAX 2D LVIDd:         2.20 cm   Diastology LVIDs:         1.10 cm   LV e' medial:    3.00 cm/s LV PW:         2.00 cm   LV E/e' medial:  35.3 LV IVS:        1.70 cm   LV e' lateral:   5.93 cm/s LVOT diam:     1.90 cm   LV E/e' lateral: 17.9 LV SV:         67 LV SV Index:   38 LVOT Area:  2.84 cm  RIGHT VENTRICLE RV Basal diam:  4.70 cm RV Mid diam:    3.10 cm RV S prime:     10.80 cm/s TAPSE (M-mode): 2.1 cm LEFT ATRIUM             Index        RIGHT ATRIUM           Index LA diam:        4.60 cm 2.59 cm/m   RA Area:     19.70 cm LA Vol (A2C):   94.1 ml 52.96 ml/m  RA Volume:   58.50 ml  32.93 ml/m LA Vol (A4C):   67.2 ml 37.82 ml/m LA Biplane Vol: 81.4 ml 45.81 ml/m  AORTIC VALVE                     PULMONIC VALVE AV Area (Vmax):    2.00 cm      PV Vmax:       1.13 m/s AV Area (Vmean):   1.94 cm      PV Peak grad:  5.1 mmHg AV Area (VTI):      1.86 cm AV Vmax:           190.00 cm/s AV Vmean:          117.000 cm/s AV VTI:            0.359 m AV Peak Grad:      14.4 mmHg AV Mean Grad:      7.0 mmHg LVOT Vmax:         134.00 cm/s LVOT Vmean:        79.900 cm/s LVOT VTI:          0.235 m LVOT/AV VTI ratio: 0.65  AORTA Ao Root diam: 2.70 cm MITRAL VALVE                TRICUSPID VALVE MV Area (PHT): 2.65 cm     TR Peak grad:   74.6 mmHg MV Area VTI:   1.19 cm     TR Vmax:        432.00 cm/s MV Peak grad:  10.6 mmHg MV Mean grad:  4.0 mmHg     SHUNTS MV Vmax:       1.63 m/s     Systemic VTI:  0.24 m MV Vmean:      92.2 cm/s    Systemic Diam: 1.90 cm MV Decel Time: 286 msec MV E velocity: 106.00 cm/s MV A velocity: 145.00 cm/s MV E/A ratio:  0.73 Lyman Bishop MD Electronically signed by Lyman Bishop MD Signature Date/Time: 04/23/2022/1:25:27 PM    Final    DG Chest 2 View  Result Date: 04/22/2022 CLINICAL DATA:  Shortness of breath. EXAM: CHEST - 2 VIEW COMPARISON:  March 09, 2020. FINDINGS: Stable cardiomegaly. Mild central pulmonary vascular congestion is noted. Probable bilateral pulmonary edema is noted. Mild to moderate right pleural effusion is noted with associated right basilar atelectasis or infiltrate. Bony thorax is unremarkable. IMPRESSION: Stable cardiomegaly with mild central pulmonary vascular congestion and probable mild bilateral pulmonary edema. Mild-to-moderate right pleural effusion is noted with associated right basilar atelectasis or infiltrate. Electronically Signed   By: Marijo Conception M.D.   On: 04/22/2022 15:53     Subjective: Pt has been ambulating in room and halls, no specific complaints.  No CP, No SOB.  Pt understands she will go home on supplemental oxygen.    Discharge Exam: Vitals:  05/05/22 0536 05/05/22 0800  BP: (!) 154/83   Pulse: 87   Resp: 18   Temp: 98.1 F (36.7 C)   SpO2: 99% 94%   Vitals:   05/04/22 2046 05/05/22 0500 05/05/22 0536 05/05/22 0800  BP: (!) 159/82  (!) 154/83   Pulse: 68  87    Resp: 20  18   Temp: 97.8 F (36.6 C)  98.1 F (36.7 C)   TempSrc: Oral     SpO2: 100%  99% 94%  Weight:  58.6 kg    Height:       Gen:- Awake Alert, in no acute distress , significant dyspnea with minimal activity HEENT:- [place].AT, No sclera icterus Nose- [place] 2L/min Neck-Supple Neck, +ve JVD,.  Lungs-diminished BS with expiratory wheezing noted  CV- S1, S2 normal, RRR, 3/6 SM Abd-  +ve B.Sounds, Abd Soft, No tenderness,    Extremity/Skin:-  +ve  edema,   good pedal pulses  Psych-affect is appropriate, oriented x3 Neuro-generalized weakness, no new focal deficits, no tremors The results of significant diagnostics from this hospitalization (including imaging, microbiology, ancillary and laboratory) are listed below for reference.     Microbiology: Recent Results (from the past 240 hour(s))  Resp panel by RT-PCR (RSV, Flu A&B, Covid) Anterior Nasal Swab     Status: None   Collection Time: 04/28/22  4:06 PM   Specimen: Anterior Nasal Swab  Result Value Ref Range Status   SARS Coronavirus 2 by RT PCR NEGATIVE NEGATIVE Final    Comment: (NOTE) SARS-CoV-2 target nucleic acids are NOT DETECTED.  The SARS-CoV-2 RNA is generally detectable in upper respiratory specimens during the acute phase of infection. The lowest concentration of SARS-CoV-2 viral copies this assay can detect is 138 copies/mL. A negative result does not preclude SARS-Cov-2 infection and should not be used as the sole basis for treatment or other patient management decisions. A negative result may occur with  improper specimen collection/handling, submission of specimen other than nasopharyngeal swab, presence of viral mutation(s) within the areas targeted by this assay, and inadequate number of viral copies(<138 copies/mL). A negative result must be combined with clinical observations, patient history, and epidemiological information. The expected result is Negative.  Fact Sheet for Patients:   EntrepreneurPulse.com.au  Fact Sheet for Healthcare Providers:  IncredibleEmployment.be  This test is no t yet approved or cleared by the [place] FDA and  has been authorized for detection and/or diagnosis of SARS-CoV-2 by FDA under an Emergency Use Authorization (EUA). This EUA will remain  in effect (meaning this test can be used) for the duration of the COVID-19 declaration under Section 564(b)(1) of the Act, 21 U.S.C.section 360bbb-3(b)(1), unless the authorization is terminated  or revoked sooner.       Influenza A by PCR NEGATIVE NEGATIVE Final   Influenza B by PCR NEGATIVE NEGATIVE Final    Comment: (NOTE) The Xpert Xpress SARS-CoV-2/FLU/RSV plus assay is intended as an aid in the diagnosis of influenza from Nasopharyngeal swab specimens and should not be used as a sole basis for treatment. Nasal washings and aspirates are unacceptable for Xpert Xpress SARS-CoV-2/FLU/RSV testing.  Fact Sheet for Patients: EntrepreneurPulse.com.au  Fact Sheet for Healthcare Providers: IncredibleEmployment.be  This test is not yet approved or cleared by the [place] FDA and has been authorized for detection and/or diagnosis of SARS-CoV-2 by FDA under an Emergency Use Authorization (EUA). This EUA will remain in effect (meaning this test can be used) for the duration of the COVID-19 declaration under Section  564(b)(1) of the Act, 21 U.S.C. section 360bbb-3(b)(1), unless the authorization is terminated or revoked.     Resp Syncytial Virus by PCR NEGATIVE NEGATIVE Final    Comment: (NOTE) Fact Sheet for Patients: EntrepreneurPulse.com.au  Fact Sheet for Healthcare Providers: IncredibleEmployment.be  This test is not yet approved or cleared by the [place] FDA and has been authorized for detection and/or diagnosis of SARS-CoV-2 by FDA under an Emergency Use  Authorization (EUA). This EUA will remain in effect (meaning this test can be used) for the duration of the COVID-19 declaration under Section 564(b)(1) of the Act, 21 U.S.C. section 360bbb-3(b)(1), unless the authorization is terminated or revoked.  Performed at [facility], [address], [place], [place] [address]   Gram stain     Status: None   Collection Time: 05/03/22 10:45 AM   Specimen: Pleura  Result Value Ref Range Status   Specimen Description PLEURAL  Final   Special Requests NONE  Final   Gram Stain   Final    NO ORGANISMS SEEN WBC PRESENT,BOTH PMN AND MONONUCLEAR CYTOSPIN SMEAR Performed at [facility], [address] [place], [place] [address]    Report Status 05/03/2022 FINAL  Final  Culture, body fluid w Gram Stain-bottle     Status: None (Preliminary result)   Collection Time: 05/03/22 10:45 AM   Specimen: Pleura  Result Value Ref Range Status   Specimen Description PLEURAL  Final   Special Requests BOTTLES DRAWN AEROBIC AND ANAEROBIC 10CC  Final   Culture   Final    NO GROWTH 2 DAYS Performed at [facility], [address], [place], [place] [address]    Report Status PENDING  Incomplete     Labs: BNP (last 3 results) Recent Labs    04/22/22 1823  BNP 123456*   Basic Metabolic Panel: Recent Labs  Lab 04/30/22 0549 05/01/22 0616 05/02/22 0537 05/03/22 0426 05/04/22 0437  NA 131* 131* 132* 134* 133*  K 4.0 4.4 3.6 3.5 3.6  CL 89* 90* 89* 91* 91*  CO2 29 31 31 30 30  $ GLUCOSE 155* 147* 184* 162* 168*  BUN 19 29* 40* 45* 50*  CREATININE 0.97 1.33* 1.35* 1.45* 1.41*  CALCIUM 8.8* 8.8* 9.1 9.0 8.9  PHOS 3.5  --   --  4.5 4.7*   Liver Function Tests: Recent Labs  Lab 04/30/22 0549 05/03/22 0426 05/04/22 0437  ALBUMIN 2.8* 3.2* 3.3*   No results for input(s): "LIPASE", "AMYLASE" in the last 168 hours. No results for input(s): "AMMONIA" in the last 168 hours. CBC: Recent Labs  Lab 04/30/22 0549 05/03/22 0426 05/04/22 0437  WBC 4.5  6.9 7.2  HGB 12.2 13.1 13.2  HCT 38.9 41.4 42.2  MCV 86.8 86.3 86.8  PLT 205 301 310   Cardiac Enzymes: No results for input(s): "CKTOTAL", "CKMB", "CKMBINDEX", "TROPONINI" in the last 168 hours. BNP: Invalid input(s): "POCBNP" CBG: Recent Labs  Lab 05/04/22 1811 05/04/22 2043 05/05/22 0328 05/05/22 0759  GLUCAP 264* 218* 184* 189*   D-Dimer No results for input(s): "DDIMER" in the last 72 hours. Hgb A1c No results for input(s): "HGBA1C" in the last 72 hours. Lipid Profile No results for input(s): "CHOL", "HDL", "LDLCALC", "TRIG", "CHOLHDL", "LDLDIRECT" in the last 72 hours. Thyroid function studies No results for input(s): "TSH", "T4TOTAL", "T3FREE", "THYROIDAB" in the last 72 hours.  Invalid input(s): "FREET3" Anemia work up No results for input(s): "VITAMINB12", "FOLATE", "FERRITIN", "TIBC", "IRON", "RETICCTPCT" in the last 72 hours. Urinalysis    Component Value  Date/Time   COLORURINE STRAW (A) 09/18/2019 2146   APPEARANCEUR HAZY (A) 09/18/2019 2146   LABSPEC 1.004 (L) 09/18/2019 2146   PHURINE 7.0 09/18/2019 2146   GLUCOSEU NEGATIVE 09/18/2019 2146   HGBUR SMALL (A) 09/18/2019 2146   BILIRUBINUR NEGATIVE 09/18/2019 2146   KETONESUR NEGATIVE 09/18/2019 2146   PROTEINUR NEGATIVE 09/18/2019 2146   UROBILINOGEN 0.2 04/16/2012 1525   NITRITE NEGATIVE 09/18/2019 2146   LEUKOCYTESUR NEGATIVE 09/18/2019 2146   Sepsis Labs Recent Labs  Lab 04/30/22 0549 05/03/22 0426 05/04/22 0437  WBC 4.5 6.9 7.2   Microbiology Recent Results (from the past 240 hour(s))  Resp panel by RT-PCR (RSV, Flu A&B, Covid) Anterior Nasal Swab     Status: None   Collection Time: 04/28/22  4:06 PM   Specimen: Anterior Nasal Swab  Result Value Ref Range Status   SARS Coronavirus 2 by RT PCR NEGATIVE NEGATIVE Final    Comment: (NOTE) SARS-CoV-2 target nucleic acids are NOT DETECTED.  The SARS-CoV-2 RNA is generally detectable in upper respiratory specimens during the acute phase of  infection. The lowest concentration of SARS-CoV-2 viral copies this assay can detect is 138 copies/mL. A negative result does not preclude SARS-Cov-2 infection and should not be used as the sole basis for treatment or other patient management decisions. A negative result may occur with  improper specimen collection/handling, submission of specimen other than nasopharyngeal swab, presence of viral mutation(s) within the areas targeted by this assay, and inadequate number of viral copies(<138 copies/mL). A negative result must be combined with clinical observations, patient history, and epidemiological information. The expected result is Negative.  Fact Sheet for Patients:  EntrepreneurPulse.com.au  Fact Sheet for Healthcare Providers:  IncredibleEmployment.be  This test is no t yet approved or cleared by the [place] FDA and  has been authorized for detection and/or diagnosis of SARS-CoV-2 by FDA under an Emergency Use Authorization (EUA). This EUA will remain  in effect (meaning this test can be used) for the duration of the COVID-19 declaration under Section 564(b)(1) of the Act, 21 U.S.C.section 360bbb-3(b)(1), unless the authorization is terminated  or revoked sooner.       Influenza A by PCR NEGATIVE NEGATIVE Final   Influenza B by PCR NEGATIVE NEGATIVE Final    Comment: (NOTE) The Xpert Xpress SARS-CoV-2/FLU/RSV plus assay is intended as an aid in the diagnosis of influenza from Nasopharyngeal swab specimens and should not be used as a sole basis for treatment. Nasal washings and aspirates are unacceptable for Xpert Xpress SARS-CoV-2/FLU/RSV testing.  Fact Sheet for Patients: EntrepreneurPulse.com.au  Fact Sheet for Healthcare Providers: IncredibleEmployment.be  This test is not yet approved or cleared by the [place] FDA and has been authorized for detection and/or diagnosis of SARS-CoV-2  by FDA under an Emergency Use Authorization (EUA). This EUA will remain in effect (meaning this test can be used) for the duration of the COVID-19 declaration under Section 564(b)(1) of the Act, 21 U.S.C. section 360bbb-3(b)(1), unless the authorization is terminated or revoked.     Resp Syncytial Virus by PCR NEGATIVE NEGATIVE Final    Comment: (NOTE) Fact Sheet for Patients: EntrepreneurPulse.com.au  Fact Sheet for Healthcare Providers: IncredibleEmployment.be  This test is not yet approved or cleared by the [place] FDA and has been authorized for detection and/or diagnosis of SARS-CoV-2 by FDA under an Emergency Use Authorization (EUA). This EUA will remain in effect (meaning this test can be used) for the duration of the COVID-19 declaration under Section 564(b)(1) of the  Act, 21 U.S.C. section 360bbb-3(b)(1), unless the authorization is terminated or revoked.  Performed at [facility], [address] [place], [place] [address]   Gram stain     Status: None   Collection Time: 05/03/22 10:45 AM   Specimen: Pleura  Result Value Ref Range Status   Specimen Description PLEURAL  Final   Special Requests NONE  Final   Gram Stain   Final    NO ORGANISMS SEEN WBC PRESENT,BOTH PMN AND MONONUCLEAR CYTOSPIN SMEAR Performed at [facility], [address] [place], [place] [address]    Report Status 05/03/2022 FINAL  Final  Culture, body fluid w Gram Stain-bottle     Status: None (Preliminary result)   Collection Time: 05/03/22 10:45 AM   Specimen: Pleura  Result Value Ref Range Status   Specimen Description PLEURAL  Final   Special Requests BOTTLES DRAWN AEROBIC AND ANAEROBIC 10CC  Final   Culture   Final    NO GROWTH 2 DAYS Performed at [facility], [address], [place], [place] [address]    Report Status PENDING  Incomplete    Time coordinating discharge: 42 mins   SIGNED:  Irwin Brakeman, MD  [facility] 05/05/2022, 10:51 AM How to contact the [facility] Attending or Consulting provider [place] or covering provider during after hours [place], for this patient?  Check the care team in [facility] and look for a) attending/consulting TRH provider listed and b) the [facility] team listed Log into [email or web address] and use [place]'s universal password to access. If you do not have the password, please contact the hospital operator. Locate the [facility] provider you are looking for under [facility] and page to a number that you can be directly reached. If you still have difficulty reaching the provider, please page the [facility] (Director on Call) for the Hospitalists listed on amion for assistance.

## 2022-05-05 NOTE — Progress Notes (Addendum)
SATURATION QUALIFICATIONS:  Patient Saturations on Room Air at Rest = 85%  Patient Saturations on Room Air while Ambulating = 80%  Patient Saturations on 2 Liters of oxygen while Ambulating = 94-96%  Pt will need o2 due to destating. Notified MD.

## 2022-05-05 NOTE — Discharge Instructions (Signed)
IMPORTANT INFORMATION: PAY CLOSE ATTENTION   PHYSICIAN DISCHARGE INSTRUCTIONS  Follow with Primary care provider  [name], [place]  and other consultants as instructed by your Hospitalist Physician  [place] IF SYMPTOMS COME BACK, WORSEN OR NEW PROBLEM DEVELOPS   Please note: You were cared for by a hospitalist during your hospital stay. Every effort will be made to forward records to your primary care provider.  You can request that your primary care provider send for your hospital records if they have not received them.  Once you are discharged, your primary care physician will handle any further medical issues. Please note that NO REFILLS for any discharge medications will be authorized once you are discharged, as it is imperative that you return to your primary care physician (or establish a relationship with a primary care physician if you do not have one) for your post hospital discharge needs so that they can reassess your need for medications and monitor your lab values.  Please get a complete blood count and chemistry panel checked by your Primary MD at your next visit, and again as instructed by your Primary MD.  Get Medicines reviewed and adjusted: Please take all your medications with you for your next visit with your Primary MD  Laboratory/radiological data: Please request your Primary MD to go over all hospital tests and procedure/radiological results at the follow up, please ask your primary care provider to get all Hospital records sent to his/her office.  In some cases, they will be blood work, cultures and biopsy results pending at the time of your discharge. Please request that your primary care provider follow up on these results.  If you are diabetic, please bring your blood sugar readings with you to your follow up appointment with primary care.    Please call and make your follow up appointments as soon as possible.    Also Note  the following: If you experience worsening of your admission symptoms, develop shortness of breath, life threatening emergency, suicidal or homicidal thoughts you must seek medical attention immediately by calling 911 or calling your MD immediately  if symptoms less severe.  You must read complete instructions/literature along with all the possible adverse reactions/side effects for all the Medicines you take and that have been prescribed to you. Take any new Medicines after you have completely understood and accpet all the possible adverse reactions/side effects.   Do not drive when taking Pain medications or sleeping medications (Benzodiazepines)  Do not take more than prescribed Pain, Sleep and Anxiety Medications. It is not advisable to combine anxiety,sleep and pain medications without talking with your primary care practitioner  Special Instructions: If you have smoked or chewed Tobacco  in the last 2 yrs please stop smoking, stop any regular Alcohol  and or any Recreational drug use.  Wear Seat belts while driving.  Do not drive if taking any narcotic, mind altering or controlled substances or recreational drugs or alcohol.

## 2022-05-05 NOTE — Care Management Important Message (Signed)
Important Message  Patient Details  Name: Leslie [name] MRN: [id number] Date of Birth: [birth year]   Medicare Important Message Given:  Yes     Tommy Medal 05/05/2022, 1:17 PM

## 2022-05-05 NOTE — TOC Transition Note (Signed)
Transition of Care [facility]) - CM/SW Discharge Note   Patient Details  Name: Leslie [name] MRN: [id number] Date of Birth: [birth year]  [facility] CM/SW Contact:  Ihor Gully, LCSW Phone Number: 05/05/2022, 12:27 PM   Clinical Narrative:    Patient in need of home oxygen. DME providers discussed. No preference identified. Referral accepted by Erasmo Downer at [place].    Final next level of care: Home/Self Care Barriers to Discharge: No Barriers Identified   Patient Goals and CMS Choice   Choice offered to / list presented to : Patient  Discharge Placement                         Discharge Plan and Services Additional resources added to the After Visit Summary for   In-house Referral: Clinical Social Work   Post Acute Care Choice: Home Health          DME Arranged: Oxygen DME Agency: [employer] Date DME Agency Contacted: 05/05/22 Time DME Agency Contacted: 72 Representative spoke with at DME Agency: Erasmo Downer HH Arranged: RN [place] Agency: [place] Date [place]: 04/25/22 Time [place]: [phone] Representative spoke with at [place]: [place] Determinants of Health ([place]) Interventions [place]: No Food Insecurity (04/22/2022)  Housing: Low Risk  (04/22/2022)  Transportation Needs: No Transportation Needs (04/22/2022)  Utilities: Not At Risk (04/22/2022)  Alcohol Screen: Low Risk  (03/30/2021)  Depression (PHQ2-9): Low Risk  (04/11/2022)  Recent Concern: Depression (PHQ2-9) - Medium Risk (04/05/2022)  Financial Resource Strain: Medium Risk (03/30/2021)  Physical Activity: Inactive (03/30/2021)  Social Connections: Moderately Integrated (03/30/2021)  Stress: No Stress Concern Present (03/30/2021)  Tobacco Use: High Risk (05/03/2022)     Readmission Risk Interventions     No data to display

## 2022-05-06 ENCOUNTER — Telehealth: Payer: Self-pay | Admitting: *Deleted

## 2022-05-06 ENCOUNTER — Encounter: Payer: Self-pay | Admitting: *Deleted

## 2022-05-06 NOTE — Transitions of Care (Post Inpatient/ED Visit) (Signed)
05/06/2022  Name: Leslie [name] MRN: [id number] DOB: 07-31-1945  Today's TOC FU Call Status: Today's TOC FU Call Status:: Successful TOC FU Call Competed TOC FU Call Complete Date: 05/06/22  Transition Care Management Follow-up Telephone Call Date of Discharge: 05/05/22 Discharge Facility: [facility] Type of Discharge: Inpatient Admission Primary Inpatient Discharge Diagnosis:: Acute Respiratory Failure with hypoxia/ CHF exacerbation, pleural effusion How have you been since you were released from the hospital?: Better Any questions or concerns?: Yes Patient Questions/Concerns:: "Things are going okay, but this hme oxygen is all new to me; I hope I can use it like I am supposed to; my family is helping me with it.  I will call [facility] as you have suggested if I have any problems.  Have not yet received my new prescriptions.  I don't have a doctor appointment yet- thanks for helping me get it scheduled.  I will call [facility] on Monday if I don't hera from them by then, and I will make sure I start weighing myself every day and writing it down" Patient Questions/Concerns Addressed: Other: (see interventions/ care coordination activities below)  -- Full medication review completed; education provided around importance of compliance/ adherence in taking all medications as prescribed along with purpose of medications -- Care Coordination Outreach to outpatient pharmacy to confirm newly prescribed medications ready/ facilitate delivery to patient's home today -- Care coordination outreach in real time with scheduling care guide team to successfully schedule hospital follow up visit within 7 days  -- Full and thorough review of AVS -- Education provided around using home O2, new to patient; importance of monitoring/ recording daily weights; following low salt diet; importance of following up with home health agency and DME company as/ if needed for questions/ concerns -- scheduled post-TOC care coordination  telephone visit with RN CM Care Coordinator for Thursday 05/11/2022 to re-assess any potential care coordination needs -- Provided my direct phone number should questions/ needs/ concerns arise prior to follow up RN CM Care Coordinator Telephone visit  Items Reviewed: Did you receive and understand the discharge instructions provided?: Yes (thoroughly reviewed AVS-- page by page- with patient who verbalizes good undertanding of same after our rview today) Medications obtained and verified?: Yes (Medications Reviewed) (Full medication review completed; self-manages medications; care coordinaiton outreach to [place] to ensure delivery of newly prescribed medications today) Any new allergies since your discharge?: No Dietary orders reviewed?: Yes Type of Diet Ordered:: heart healthy, low salt Do you have support at home?: Yes People in Home: alone Name of Support/Comfort Primary Source: local family- daughters and niece assist with care needs as indicated; patient reports very supportive family  Home Care and Equipment/Supplies: [place] [facility] [place] Ordered?: Yes Name of [place]:: [phone] confirmed patient has phone number to agency; advised her to call them on Monday 05/09/22 if she has not heard from them by then Any new equipment or medical supplies ordered?: Yes (home O2- new to patient) Name of Medical supply agency?: [facility] Were you able to get the equipment/medical supplies?: Yes Do you have any questions related to the use of the equipment/supplies?: No (reports using as prescribed, but verbalizes lack of confidence in use due to newness of using at home; confirmed she has phone number to [facility] and understands to call for ongoing questions/ need for additional education)  Functional Questionnaire: Do you need assistance with bathing/showering or dressing?: No Do you need assistance with meal preparation?: No Do you need assistance  with  eating?: No Do you have difficulty maintaining continence: No Do you need assistance with getting out of bed/getting out of a chair/moving?: No Do you have difficulty managing or taking your medications?: No  Folllow up appointments reviewed: PCP Follow-up appointment confirmed?: Yes Date of PCP follow-up appointment?: 05/10/22 (care coordination outreach in real time with schedulng care guide team to successfully schedule hospital follow up visit within 7 days) Follow-up Provider: PCP, Dr. Court Joy Specialist [facility] Follow-up appointment confirmed?: Yes Date of Specialist follow-up appointment?: 05/19/22 Follow-Up Specialty Provider:: Pulmonary provider- [place] Do you need transportation to your follow-up appointment?: No Do you understand care options if your condition(s) worsen?: Yes-patient verbalized understanding  SDOH Interventions Today    Flowsheet Row Most Recent Value  SDOH Interventions   Food Insecurity Interventions Intervention Not Indicated  Transportation Interventions Intervention Not Indicated  [normally drives self,  local family assists as indicated/ needed]      TOC Interventions Today    Flowsheet Row Most Recent Value  TOC Interventions   TOC Interventions Discussed/Reviewed TOC Interventions Discussed, TOC Interventions Reviewed, Arranged PCP follow up within 7 days/Care Guide scheduled, Troubleshoot use of DME in the home, Contacted provider for patient needs  [care coordination outreach in real time with schedulng care guide team to successfully schedule hospital follow up visit within 7 days,  contacted outpatient pharmacy to facilitate delivery of newly prescribed medications today]      Interventions Today    Flowsheet Row Most Recent Value  Chronic Disease   Chronic disease during today's visit Congestive Heart Failure (CHF)  General Interventions   General Interventions Discussed/Reviewed General Interventions Discussed, Doctor Visits, Referral  to Nurse  [care coordination outreach in real time with schedulng care guide team to successfully schedule hospital follow up visit within 7 days]  Doctor Visits Discussed/Reviewed Doctor Visits Discussed, PCP, Specialist  PCP/Specialist Visits Compliance with follow-up visit  Education Interventions   Education Provided Provided Education  Provided Verbal Education On Medication, When to see the doctor, Other  [use of newly ordered home O2,  need to take K+ supplmentation as prescribed,  importance of monitoring/ recording daily weights in setting of CHF,  importance of limiting salt in diet]  Nutrition Interventions   Nutrition Discussed/Reviewed Nutrition Discussed, Decreasing salt  Pharmacy Interventions   Pharmacy Dicussed/Reviewed Pharmacy Topics Discussed, Medication Adherence, Medications and their functions  [care coordination outreach to outpatient pharmacy]      Oneta Rack, RN, BSN, CCRN Alumnus RN CM Care Coordination/ Transition of [place] Management [phone]: direct office

## 2022-05-06 NOTE — Progress Notes (Signed)
  Care Coordination  Note  05/06/2022 Name: Leslie [name] MRN: [id number] DOB: Jul 10, 1945  HERO PREM is a 77 y.o. year old primary care patient of [name], [place].   Follow up plan: Hospital Follow Up appointment scheduled with (Dr Court Joy) on (05/10/2022) at (220pm).  Julian Hy, [place] Direct Dial: [phone]

## 2022-05-08 LAB — CYTOLOGY - NON PAP

## 2022-05-08 LAB — CULTURE, BODY FLUID W GRAM STAIN -BOTTLE: Culture: NO GROWTH

## 2022-05-09 ENCOUNTER — Telehealth: Payer: Self-pay | Admitting: Family Medicine

## 2022-05-09 DIAGNOSIS — I3139 Other pericardial effusion (noninflammatory): Secondary | ICD-10-CM | POA: Diagnosis not present

## 2022-05-09 DIAGNOSIS — E1151 Type 2 diabetes mellitus with diabetic peripheral angiopathy without gangrene: Secondary | ICD-10-CM | POA: Diagnosis not present

## 2022-05-09 DIAGNOSIS — I110 Hypertensive heart disease with heart failure: Secondary | ICD-10-CM | POA: Diagnosis not present

## 2022-05-09 DIAGNOSIS — J441 Chronic obstructive pulmonary disease with (acute) exacerbation: Secondary | ICD-10-CM | POA: Diagnosis not present

## 2022-05-09 DIAGNOSIS — I2510 Atherosclerotic heart disease of native coronary artery without angina pectoris: Secondary | ICD-10-CM | POA: Diagnosis not present

## 2022-05-09 DIAGNOSIS — I421 Obstructive hypertrophic cardiomyopathy: Secondary | ICD-10-CM | POA: Diagnosis not present

## 2022-05-09 DIAGNOSIS — J9601 Acute respiratory failure with hypoxia: Secondary | ICD-10-CM | POA: Diagnosis not present

## 2022-05-09 DIAGNOSIS — I5033 Acute on chronic diastolic (congestive) heart failure: Secondary | ICD-10-CM | POA: Diagnosis not present

## 2022-05-09 DIAGNOSIS — I959 Hypotension, unspecified: Secondary | ICD-10-CM | POA: Diagnosis not present

## 2022-05-09 NOTE — Telephone Encounter (Signed)
Spoke to Judson Roch let he know approval was given by provider.

## 2022-05-09 NOTE — Telephone Encounter (Signed)
yes

## 2022-05-09 NOTE — Telephone Encounter (Signed)
Leslie [name], [phone]   Wants verbal order for nursing for  2x wk for 1wk 1x wk for 3 wks 1 x every other week for 4 wks    Also wants to add OT?

## 2022-05-10 ENCOUNTER — Ambulatory Visit (INDEPENDENT_AMBULATORY_CARE_PROVIDER_SITE_OTHER): Payer: Medicare HMO | Admitting: Internal Medicine

## 2022-05-10 ENCOUNTER — Encounter: Payer: Self-pay | Admitting: Internal Medicine

## 2022-05-10 ENCOUNTER — Telehealth: Payer: Self-pay

## 2022-05-10 VITALS — BP 110/73 | HR 128 | Ht 67.0 in | Wt 103.0 lb

## 2022-05-10 DIAGNOSIS — I5033 Acute on chronic diastolic (congestive) heart failure: Secondary | ICD-10-CM

## 2022-05-10 NOTE — Assessment & Plan Note (Addendum)
Patient admitted 04/22/2022 for acute respiratory failure with hypoxia secondary to CHF and discharged 05/05/2022.  Patient underwent thoracentesis for right-sided transudate pleural effusion.  She was diuresed 9 L and discharge weight 129 and today she is 103lbs. Nurse double checked her weight and recording in office correct.  Satting at 87% on 3 L and asymptomatic at rest. She has trace pleural effusions seen with point of care ultrasound, rales on auscultation, 2+ pitting edema of ankles,and JVD 9 to 10 cm. She has continued lasix 40 mg since discharge and feels about the same as when she left the hospital.    Plan: - She will continue 40 mg Lasix daily - Continue Supplemental oxygen to maintain O2 sat 88 to 92% - BMP, MG - I coordinated care with patient cardiologist after visit. Patient will be seen in cardiology clinic and  cardiology will coordinate having patient seen by HF Clinic for further workup of possible hypertrophic or infiltrative cardiomyopathy and severe pulmonary HTN.

## 2022-05-10 NOTE — Telephone Encounter (Signed)
Spoke w/pt's family members after OV today, per pt wanted to know if you can send Rx for shower bench and toilet seat riser with handles sent to [employer] ?

## 2022-05-10 NOTE — Progress Notes (Signed)
   HPI:Leslie [name] is a 77 y.o. female living with pertinent history of COPD and hypertrophic cardiomyopathy who presents for hospital follow up. For the details of today's visit, please refer to the assessment and plan.  Physical Exam: Vitals:   05/10/22 1442  BP: 110/73  Pulse: (!) 128  SpO2: (!) 87%  Weight: 103 lb (46.7 kg)  Height: 5' 7"$  (1.702 m)     Physical Exam Constitutional:      Comments: No acute distress, sitting in wheelchair wearing nasal canula , on 3 L supplemental oxygen via [place]  Neck:     Vascular: JVD present.  Cardiovascular:     Rate and Rhythm: Regular rhythm. Tachycardia present.     Heart sounds: Murmur heard.     Systolic murmur is present with a grade of 3/6.     Comments: Warm distal extremities Pulmonary:     Breath sounds: Examination of the right-lower field reveals decreased breath sounds. Examination of the left-lower field reveals decreased breath sounds. Decreased breath sounds and rales present. No wheezing.  Musculoskeletal:     Right lower leg: 2+ Edema present.     Left lower leg: 2+ Edema present.      Assessment & Plan:   Leslie [name] was seen today for follow-up.  Acute on chronic diastolic HF (heart failure) [facility]) Assessment & Plan: Patient admitted 04/22/2022 for acute respiratory failure with hypoxia secondary to CHF and discharged 05/05/2022.  Patient underwent thoracentesis for right-sided transudate pleural effusion.  She was diuresed 9 L and discharge weight 129 and today she is 103lbs. Nurse double checked her weight and recording in office correct.  Satting at 87% on 3 L and asymptomatic at rest. She has trace pleural effusions seen with point of care ultrasound, rales on auscultation, 2+ pitting edema of ankles,and JVD 9 to 10 cm. She has continued lasix 40 mg since discharge and feels about the same as when she left the hospital.    Plan: - She will continue 40 mg Lasix daily - BMP, MG - I coordinated care with patient  cardiologist after visit. Patient will be seen in cardiology clinic and  cardiology will coordinate having patient seen by HF Clinic for further workup of possible hypertrophic or infiltrative cardiomyopathy and severe pulmonary HTN.   Orders: -     BMP8+EGFR -     Magnesium -     For home use only DME Shower stool -     For home use only DME Eelevated commode seat      Lorene Dy, MD

## 2022-05-10 NOTE — Patient Instructions (Signed)
Thank you, Ms.Ashanty E Dobberstein for allowing [place] to provide your care today.   I have ordered the following labs for you:   Lab Orders         BMP8+EGFR         Magnesium        Reminders: I will follow up with cardiologist and see if he can see you sooner.     Tamsen Snider, M.D.

## 2022-05-11 LAB — BMP8+EGFR
BUN/Creatinine Ratio: 27 (ref 12–28)
BUN: 36 mg/dL — ABNORMAL HIGH (ref 8–27)
CO2: 25 mmol/L (ref 20–29)
Calcium: 9.2 mg/dL (ref 8.7–10.3)
Chloride: 90 mmol/L — ABNORMAL LOW (ref 96–106)
Creatinine, Ser: 1.33 mg/dL — ABNORMAL HIGH (ref 0.57–1.00)
Glucose: 152 mg/dL — ABNORMAL HIGH (ref 70–99)
Potassium: 4.4 mmol/L (ref 3.5–5.2)
Sodium: 136 mmol/L (ref 134–144)
eGFR: 42 mL/min/{1.73_m2} — ABNORMAL LOW (ref >59)

## 2022-05-11 LAB — MAGNESIUM: Magnesium: 1.7 mg/dL (ref 1.6–2.3)

## 2022-05-12 ENCOUNTER — Inpatient Hospital Stay (HOSPITAL_COMMUNITY)
Admission: EM | Admit: 2022-05-12 | Discharge: 2022-05-20 | DRG: 871 | Disposition: E | Payer: Medicare HMO | Attending: Pulmonary Disease | Admitting: Pulmonary Disease

## 2022-05-12 ENCOUNTER — Encounter (HOSPITAL_COMMUNITY): Payer: Self-pay | Admitting: *Deleted

## 2022-05-12 ENCOUNTER — Emergency Department (HOSPITAL_COMMUNITY): Payer: Medicare HMO

## 2022-05-12 ENCOUNTER — Encounter: Payer: Self-pay | Admitting: *Deleted

## 2022-05-12 ENCOUNTER — Ambulatory Visit: Payer: Self-pay | Admitting: *Deleted

## 2022-05-12 ENCOUNTER — Other Ambulatory Visit: Payer: Self-pay

## 2022-05-12 ENCOUNTER — Telehealth: Payer: Self-pay

## 2022-05-12 DIAGNOSIS — U071 COVID-19: Secondary | ICD-10-CM | POA: Diagnosis present

## 2022-05-12 DIAGNOSIS — Z66 Do not resuscitate: Secondary | ICD-10-CM | POA: Diagnosis present

## 2022-05-12 DIAGNOSIS — D649 Anemia, unspecified: Secondary | ICD-10-CM | POA: Diagnosis present

## 2022-05-12 DIAGNOSIS — E440 Moderate protein-calorie malnutrition: Secondary | ICD-10-CM | POA: Diagnosis not present

## 2022-05-12 DIAGNOSIS — E8729 Other acidosis: Secondary | ICD-10-CM | POA: Diagnosis present

## 2022-05-12 DIAGNOSIS — J80 Acute respiratory distress syndrome: Secondary | ICD-10-CM | POA: Diagnosis present

## 2022-05-12 DIAGNOSIS — I5032 Chronic diastolic (congestive) heart failure: Secondary | ICD-10-CM | POA: Diagnosis present

## 2022-05-12 DIAGNOSIS — M069 Rheumatoid arthritis, unspecified: Secondary | ICD-10-CM | POA: Diagnosis present

## 2022-05-12 DIAGNOSIS — Z4682 Encounter for fitting and adjustment of non-vascular catheter: Secondary | ICD-10-CM | POA: Diagnosis not present

## 2022-05-12 DIAGNOSIS — I2489 Other forms of acute ischemic heart disease: Secondary | ICD-10-CM | POA: Diagnosis present

## 2022-05-12 DIAGNOSIS — R231 Pallor: Secondary | ICD-10-CM | POA: Diagnosis not present

## 2022-05-12 DIAGNOSIS — Z83438 Family history of other disorder of lipoprotein metabolism and other lipidemia: Secondary | ICD-10-CM

## 2022-05-12 DIAGNOSIS — I4891 Unspecified atrial fibrillation: Secondary | ICD-10-CM | POA: Diagnosis present

## 2022-05-12 DIAGNOSIS — Z8249 Family history of ischemic heart disease and other diseases of the circulatory system: Secondary | ICD-10-CM

## 2022-05-12 DIAGNOSIS — N170 Acute kidney failure with tubular necrosis: Secondary | ICD-10-CM | POA: Diagnosis present

## 2022-05-12 DIAGNOSIS — J9601 Acute respiratory failure with hypoxia: Secondary | ICD-10-CM

## 2022-05-12 DIAGNOSIS — I272 Pulmonary hypertension, unspecified: Secondary | ICD-10-CM | POA: Diagnosis present

## 2022-05-12 DIAGNOSIS — J441 Chronic obstructive pulmonary disease with (acute) exacerbation: Secondary | ICD-10-CM | POA: Diagnosis present

## 2022-05-12 DIAGNOSIS — A4101 Sepsis due to Methicillin susceptible Staphylococcus aureus: Secondary | ICD-10-CM | POA: Diagnosis not present

## 2022-05-12 DIAGNOSIS — J811 Chronic pulmonary edema: Secondary | ICD-10-CM | POA: Diagnosis not present

## 2022-05-12 DIAGNOSIS — R6521 Severe sepsis with septic shock: Secondary | ICD-10-CM | POA: Diagnosis not present

## 2022-05-12 DIAGNOSIS — Z515 Encounter for palliative care: Secondary | ICD-10-CM | POA: Diagnosis not present

## 2022-05-12 DIAGNOSIS — H5702 Anisocoria: Secondary | ICD-10-CM | POA: Diagnosis present

## 2022-05-12 DIAGNOSIS — J439 Emphysema, unspecified: Secondary | ICD-10-CM | POA: Diagnosis not present

## 2022-05-12 DIAGNOSIS — R069 Unspecified abnormalities of breathing: Secondary | ICD-10-CM | POA: Diagnosis not present

## 2022-05-12 DIAGNOSIS — I110 Hypertensive heart disease with heart failure: Secondary | ICD-10-CM | POA: Diagnosis present

## 2022-05-12 DIAGNOSIS — J1282 Pneumonia due to coronavirus disease 2019: Secondary | ICD-10-CM | POA: Diagnosis present

## 2022-05-12 DIAGNOSIS — R000 Tachycardia, unspecified: Secondary | ICD-10-CM | POA: Diagnosis not present

## 2022-05-12 DIAGNOSIS — Z7951 Long term (current) use of inhaled steroids: Secondary | ICD-10-CM

## 2022-05-12 DIAGNOSIS — A419 Sepsis, unspecified organism: Secondary | ICD-10-CM

## 2022-05-12 DIAGNOSIS — E1151 Type 2 diabetes mellitus with diabetic peripheral angiopathy without gangrene: Secondary | ICD-10-CM | POA: Diagnosis present

## 2022-05-12 DIAGNOSIS — I422 Other hypertrophic cardiomyopathy: Secondary | ICD-10-CM | POA: Diagnosis present

## 2022-05-12 DIAGNOSIS — Z7982 Long term (current) use of aspirin: Secondary | ICD-10-CM

## 2022-05-12 DIAGNOSIS — J9690 Respiratory failure, unspecified, unspecified whether with hypoxia or hypercapnia: Secondary | ICD-10-CM | POA: Diagnosis not present

## 2022-05-12 DIAGNOSIS — J9621 Acute and chronic respiratory failure with hypoxia: Principal | ICD-10-CM

## 2022-05-12 DIAGNOSIS — R54 Age-related physical debility: Secondary | ICD-10-CM | POA: Diagnosis present

## 2022-05-12 DIAGNOSIS — R579 Shock, unspecified: Secondary | ICD-10-CM

## 2022-05-12 DIAGNOSIS — J9622 Acute and chronic respiratory failure with hypercapnia: Secondary | ICD-10-CM | POA: Diagnosis not present

## 2022-05-12 DIAGNOSIS — F17210 Nicotine dependence, cigarettes, uncomplicated: Secondary | ICD-10-CM | POA: Diagnosis not present

## 2022-05-12 DIAGNOSIS — Z833 Family history of diabetes mellitus: Secondary | ICD-10-CM

## 2022-05-12 DIAGNOSIS — J189 Pneumonia, unspecified organism: Secondary | ICD-10-CM | POA: Diagnosis not present

## 2022-05-12 DIAGNOSIS — Z79899 Other long term (current) drug therapy: Secondary | ICD-10-CM

## 2022-05-12 DIAGNOSIS — J9691 Respiratory failure, unspecified with hypoxia: Secondary | ICD-10-CM | POA: Diagnosis present

## 2022-05-12 DIAGNOSIS — R6520 Severe sepsis without septic shock: Secondary | ICD-10-CM | POA: Diagnosis not present

## 2022-05-12 DIAGNOSIS — I700 Atherosclerosis of aorta: Secondary | ICD-10-CM | POA: Diagnosis not present

## 2022-05-12 DIAGNOSIS — I38 Endocarditis, valve unspecified: Secondary | ICD-10-CM | POA: Diagnosis not present

## 2022-05-12 DIAGNOSIS — G929 Unspecified toxic encephalopathy: Secondary | ICD-10-CM | POA: Diagnosis not present

## 2022-05-12 DIAGNOSIS — Z8616 Personal history of COVID-19: Secondary | ICD-10-CM | POA: Diagnosis not present

## 2022-05-12 DIAGNOSIS — J90 Pleural effusion, not elsewhere classified: Secondary | ICD-10-CM | POA: Diagnosis not present

## 2022-05-12 DIAGNOSIS — Z90710 Acquired absence of both cervix and uterus: Secondary | ICD-10-CM

## 2022-05-12 DIAGNOSIS — R918 Other nonspecific abnormal finding of lung field: Secondary | ICD-10-CM | POA: Diagnosis not present

## 2022-05-12 DIAGNOSIS — J15211 Pneumonia due to Methicillin susceptible Staphylococcus aureus: Secondary | ICD-10-CM | POA: Diagnosis not present

## 2022-05-12 DIAGNOSIS — E785 Hyperlipidemia, unspecified: Secondary | ICD-10-CM | POA: Diagnosis present

## 2022-05-12 DIAGNOSIS — R0602 Shortness of breath: Secondary | ICD-10-CM | POA: Diagnosis not present

## 2022-05-12 DIAGNOSIS — Z9049 Acquired absence of other specified parts of digestive tract: Secondary | ICD-10-CM | POA: Diagnosis not present

## 2022-05-12 DIAGNOSIS — J440 Chronic obstructive pulmonary disease with acute lower respiratory infection: Secondary | ICD-10-CM | POA: Diagnosis present

## 2022-05-12 DIAGNOSIS — I4510 Unspecified right bundle-branch block: Secondary | ICD-10-CM | POA: Diagnosis present

## 2022-05-12 DIAGNOSIS — I10 Essential (primary) hypertension: Secondary | ICD-10-CM | POA: Diagnosis not present

## 2022-05-12 DIAGNOSIS — Z809 Family history of malignant neoplasm, unspecified: Secondary | ICD-10-CM

## 2022-05-12 DIAGNOSIS — R7989 Other specified abnormal findings of blood chemistry: Secondary | ICD-10-CM

## 2022-05-12 DIAGNOSIS — R0902 Hypoxemia: Secondary | ICD-10-CM | POA: Diagnosis not present

## 2022-05-12 LAB — LACTIC ACID, PLASMA
Lactic Acid, Venous: 3.6 mmol/L (ref 0.5–1.9)
Lactic Acid, Venous: 3.7 mmol/L (ref 0.5–1.9)

## 2022-05-12 LAB — CBC WITH DIFFERENTIAL/PLATELET
Abs Immature Granulocytes: 0.82 10*3/uL — ABNORMAL HIGH (ref 0.00–0.07)
Basophils Absolute: 0.2 10*3/uL — ABNORMAL HIGH (ref 0.0–0.1)
Basophils Relative: 1 %
Eosinophils Absolute: 0 10*3/uL (ref 0.0–0.5)
Eosinophils Relative: 0 %
HCT: 42.7 % (ref 36.0–46.0)
Hemoglobin: 13.4 g/dL (ref 12.0–15.0)
Immature Granulocytes: 3 %
Lymphocytes Relative: 2 %
Lymphs Abs: 0.5 10*3/uL — ABNORMAL LOW (ref 0.7–4.0)
MCH: 27.7 pg (ref 26.0–34.0)
MCHC: 31.4 g/dL (ref 30.0–36.0)
MCV: 88.2 fL (ref 80.0–100.0)
Monocytes Absolute: 0.6 10*3/uL (ref 0.1–1.0)
Monocytes Relative: 2 %
Neutro Abs: 28.9 10*3/uL — ABNORMAL HIGH (ref 1.7–7.7)
Neutrophils Relative %: 92 %
Platelets: 178 10*3/uL (ref 150–400)
RBC: 4.84 MIL/uL (ref 3.87–5.11)
RDW: 18 % — ABNORMAL HIGH (ref 11.5–15.5)
WBC Morphology: INCREASED
WBC: 31 10*3/uL — ABNORMAL HIGH (ref 4.0–10.5)
nRBC: 0 % (ref 0.0–0.2)

## 2022-05-12 LAB — BLOOD GAS, ARTERIAL
Acid-Base Excess: 3.6 mmol/L — ABNORMAL HIGH (ref 0.0–2.0)
Bicarbonate: 29.6 mmol/L — ABNORMAL HIGH (ref 20.0–28.0)
Drawn by: 38235
FIO2: 100 %
O2 Saturation: 96.1 %
Patient temperature: 38.3
pCO2 arterial: 53 mmHg — ABNORMAL HIGH (ref 32–48)
pH, Arterial: 7.36 (ref 7.35–7.45)
pO2, Arterial: 82 mmHg — ABNORMAL LOW (ref 83–108)

## 2022-05-12 LAB — COMPREHENSIVE METABOLIC PANEL
ALT: 51 U/L — ABNORMAL HIGH (ref 0–44)
AST: 87 U/L — ABNORMAL HIGH (ref 15–41)
Albumin: 2.7 g/dL — ABNORMAL LOW (ref 3.5–5.0)
Alkaline Phosphatase: 78 U/L (ref 38–126)
Anion gap: 10 (ref 5–15)
BUN: 45 mg/dL — ABNORMAL HIGH (ref 8–23)
CO2: 30 mmol/L (ref 22–32)
Calcium: 8.2 mg/dL — ABNORMAL LOW (ref 8.9–10.3)
Chloride: 91 mmol/L — ABNORMAL LOW (ref 98–111)
Creatinine, Ser: 1.63 mg/dL — ABNORMAL HIGH (ref 0.44–1.00)
GFR, Estimated: 33 mL/min — ABNORMAL LOW (ref >60)
Glucose, Bld: 130 mg/dL — ABNORMAL HIGH (ref 70–99)
Potassium: 4.4 mmol/L (ref 3.5–5.1)
Sodium: 131 mmol/L — ABNORMAL LOW (ref 135–145)
Total Bilirubin: 3.2 mg/dL — ABNORMAL HIGH (ref 0.3–1.2)
Total Protein: 6.7 g/dL (ref 6.5–8.1)

## 2022-05-12 LAB — BLOOD GAS, VENOUS
Acid-Base Excess: 3.4 mmol/L — ABNORMAL HIGH (ref 0.0–2.0)
Bicarbonate: 35.5 mmol/L — ABNORMAL HIGH (ref 20.0–28.0)
Drawn by: 66297
O2 Saturation: 79.6 %
Patient temperature: 41.1
pCO2, Ven: 114 mmHg (ref 44–60)
pH, Ven: 7.13 — CL (ref 7.25–7.43)
pO2, Ven: 76 mmHg — ABNORMAL HIGH (ref 32–45)

## 2022-05-12 LAB — URINALYSIS, W/ REFLEX TO CULTURE (INFECTION SUSPECTED)
Bilirubin Urine: NEGATIVE
Glucose, UA: NEGATIVE mg/dL
Ketones, ur: NEGATIVE mg/dL
Leukocytes,Ua: NEGATIVE
Nitrite: NEGATIVE
Protein, ur: >=300 mg/dL — AB
Renal Epithelial: 1
Specific Gravity, Urine: 1.019 (ref 1.005–1.030)
pH: 6 (ref 5.0–8.0)

## 2022-05-12 LAB — RESP PANEL BY RT-PCR (RSV, FLU A&B, COVID)  RVPGX2
Influenza A by PCR: NEGATIVE
Influenza B by PCR: NEGATIVE
Resp Syncytial Virus by PCR: NEGATIVE
SARS Coronavirus 2 by RT PCR: POSITIVE — AB

## 2022-05-12 LAB — BRAIN NATRIURETIC PEPTIDE: B Natriuretic Peptide: 1528 pg/mL — ABNORMAL HIGH (ref 0.0–100.0)

## 2022-05-12 LAB — TROPONIN I (HIGH SENSITIVITY)
Troponin I (High Sensitivity): 1077 ng/L (ref <18)
Troponin I (High Sensitivity): 1141 ng/L (ref <18)

## 2022-05-12 MED ORDER — ETOMIDATE 2 MG/ML IV SOLN: 20.0000 mg | Freq: Once | INTRAVENOUS | Status: AC

## 2022-05-12 MED ORDER — AMIODARONE LOAD VIA INFUSION
150.0000 mg | Freq: Once | INTRAVENOUS | Status: AC
  Administered 2022-05-12: 150 mg via INTRAVENOUS
  Filled 2022-05-12: qty 83.34

## 2022-05-12 MED ORDER — VANCOMYCIN HCL 750 MG/150ML IV SOLN: 750.0000 mg | INTRAVENOUS | Status: DC

## 2022-05-12 MED ORDER — ETOMIDATE 2 MG/ML IV SOLN
INTRAVENOUS | Status: AC
  Administered 2022-05-12: 20 mg via INTRAVENOUS
  Filled 2022-05-12: qty 20

## 2022-05-12 MED ORDER — SODIUM CHLORIDE 0.9 % IV SOLN: INTRAVENOUS | Status: DC

## 2022-05-12 MED ORDER — SODIUM CHLORIDE 0.9 % IV SOLN: 500.0000 mg | Freq: Once | INTRAVENOUS | Status: DC

## 2022-05-12 MED ORDER — ACETAMINOPHEN 10 MG/ML IV SOLN
1000.0000 mg | Freq: Four times a day (QID) | INTRAVENOUS | Status: AC
  Administered 2022-05-12 – 2022-05-13 (×3): 1000 mg via INTRAVENOUS
  Filled 2022-05-12 (×4): qty 100

## 2022-05-12 MED ORDER — SODIUM CHLORIDE 0.9 % IV SOLN: 2.0000 g | INTRAVENOUS | Status: DC

## 2022-05-12 MED ORDER — SUCCINYLCHOLINE CHLORIDE 200 MG/10ML IV SOSY
PREFILLED_SYRINGE | INTRAVENOUS | Status: AC
  Administered 2022-05-12: 100 mg via INTRAVENOUS
  Filled 2022-05-12: qty 10

## 2022-05-12 MED ORDER — FUROSEMIDE 10 MG/ML IJ SOLN
80.0000 mg | Freq: Once | INTRAMUSCULAR | Status: AC
  Administered 2022-05-12: 80 mg via INTRAVENOUS
  Filled 2022-05-12: qty 8

## 2022-05-12 MED ORDER — NITROGLYCERIN IN D5W 200-5 MCG/ML-% IV SOLN: 5.0000 ug/min | INTRAVENOUS | Status: DC

## 2022-05-12 MED ORDER — MIDAZOLAM-SODIUM CHLORIDE 100-0.9 MG/100ML-% IV SOLN
0.5000 mg/h | INTRAVENOUS | Status: DC
  Administered 2022-05-12: 0.5 mg/h via INTRAVENOUS
  Filled 2022-05-12: qty 100

## 2022-05-12 MED ORDER — AMIODARONE HCL 150 MG/3ML IV SOLN
150.0000 mg | Freq: Once | INTRAVENOUS | Status: DC
  Filled 2022-05-12: qty 3

## 2022-05-12 MED ORDER — SODIUM CHLORIDE 0.9 % IV SOLN: 1.0000 g | Freq: Once | INTRAVENOUS | Status: DC

## 2022-05-12 MED ORDER — LACTATED RINGERS IV BOLUS (SEPSIS)
1000.0000 mL | Freq: Once | INTRAVENOUS | Status: AC
  Administered 2022-05-12: 1000 mL via INTRAVENOUS

## 2022-05-12 MED ORDER — NOREPINEPHRINE 4 MG/250ML-% IV SOLN
0.0000 ug/min | INTRAVENOUS | Status: DC
  Administered 2022-05-12: 2 ug/min via INTRAVENOUS
  Administered 2022-05-13 (×2): 18 ug/min via INTRAVENOUS
  Filled 2022-05-12 (×3): qty 250

## 2022-05-12 MED ORDER — AMIODARONE HCL IN DEXTROSE 360-4.14 MG/200ML-% IV SOLN
60.0000 mg/h | INTRAVENOUS | Status: AC
  Administered 2022-05-12: 60 mg/h via INTRAVENOUS
  Filled 2022-05-12 (×2): qty 200

## 2022-05-12 MED ORDER — VANCOMYCIN HCL IN DEXTROSE 1-5 GM/200ML-% IV SOLN
1000.0000 mg | Freq: Once | INTRAVENOUS | Status: AC
  Administered 2022-05-12: 1000 mg via INTRAVENOUS
  Filled 2022-05-12: qty 200

## 2022-05-12 MED ORDER — LACTATED RINGERS IV SOLN: INTRAVENOUS | Status: DC

## 2022-05-12 MED ORDER — SODIUM CHLORIDE 0.9 % IV SOLN
2.0000 g | Freq: Once | INTRAVENOUS | Status: AC
  Administered 2022-05-12: 2 g via INTRAVENOUS
  Filled 2022-05-12: qty 12.5

## 2022-05-12 MED ORDER — LACTATED RINGERS IV BOLUS
1000.0000 mL | Freq: Once | INTRAVENOUS | Status: AC
  Administered 2022-05-12: 1000 mL via INTRAVENOUS

## 2022-05-12 MED ORDER — FENTANYL 2500MCG IN NS 250ML (10MCG/ML) PREMIX INFUSION
0.0000 ug/h | INTRAVENOUS | Status: DC
  Administered 2022-05-12: 25 ug/h via INTRAVENOUS
  Filled 2022-05-12: qty 250

## 2022-05-12 MED ORDER — LACTATED RINGERS IV BOLUS (SEPSIS)
500.0000 mL | Freq: Once | INTRAVENOUS | Status: AC
  Administered 2022-05-12: 500 mL via INTRAVENOUS

## 2022-05-12 MED ORDER — AMIODARONE HCL IN DEXTROSE 360-4.14 MG/200ML-% IV SOLN
30.0000 mg/h | INTRAVENOUS | Status: DC
  Administered 2022-05-13 – 2022-05-14 (×3): 30 mg/h via INTRAVENOUS
  Filled 2022-05-12 (×3): qty 200

## 2022-05-12 MED ORDER — SUCCINYLCHOLINE CHLORIDE 200 MG/10ML IV SOSY: 100.0000 mg | PREFILLED_SYRINGE | Freq: Once | INTRAVENOUS | Status: AC

## 2022-05-12 NOTE — ED Notes (Signed)
Pt depart with [facility]

## 2022-05-12 NOTE — ED Provider Notes (Signed)
[facility] Provider Note   CSN: [id number] Arrival date & time: 04/21/2022  1716     History  Chief Complaint  Patient presents with   Shortness of Breath    Leslie [name] is a 77 y.o. female.  Patient brought in by EMS on CPAP.  EMS been at the house earlier thought was COPD exacerbation gave her breathing treatments.  Patient got worse EMS called again.  This time they started on CPAP.  Patient normally on oxygen at home according to EMS.  But not sure how much.  Patient with recent admission to the [facility] from February 2 to February 15.  Main reason for admission was acute respiratory failure with hypoxia secondary to congestive heart failure and volume overload.  Patient in significant respiratory distress upon arrival switched over to BiPAP.  Patient monitored patient had a fever to 105 but we did not know that it first.  Initially ordered blood cultures urine culture and lab work with the keeping in mind that possibly could be sepsis but she never had hypotension but did have high fever and was tachycardic.  Sepsis protocol initiated.  Patient eventually became very somnolent probably from CO2 retention we figured and patient required emergent intubation.  Following intubation patient's blood pressures dropped before to [facility] it was like [id number] systolic [id number] systolic and was thinking that she was going to need IV nitroglycerin drip and IV Lasix.  But postintubation blood pressures were low going along with more of the sepsis picture.  Also her initial EKG raise concerns about an anterior MI however with her critical care situation not a candidate to go directly to the Cath Lab.  Troponins were ordered.  Right prior to intubation patient was bringing up frothy stuff again the concern for pulmonary edema which could correlate with her recent admission.       Home Medications Prior to Admission medications   Medication Sig Start Date  End Date Taking? Authorizing Provider  acetaminophen (TYLENOL) 325 MG tablet Take 2 tablets (650 mg total) by mouth every 6 (six) hours as needed for mild pain or moderate pain (or Fever >/= 101). 05/05/22  Yes Johnson, Clanford L, MD  albuterol (VENTOLIN HFA) 108 (90 Base) MCG/ACT inhaler Inhale 2 puffs into the lungs every 6 (six) hours as needed for wheezing or shortness of breath. INHALE 2 INHALATIONS INTO THE LUNGS EVERY 6 HOURS AS NEEDED FOR WHEEZING Strength: 108 (90 Base) MCG/ACT 11/17/21  Yes Paseda, Lillie Columbia R, FNP  Artificial Tears ophthalmic solution Place 2 drops into both eyes as needed. 04/05/22 04/05/23 Yes [name], FNP  aspirin EC 81 MG tablet Take 81 mg by mouth daily.   Yes [provider]  furosemide (LASIX) 40 MG tablet Take 1 tablet (40 mg total) by mouth daily. 05/06/22  Yes Johnson, Clanford L, MD  ipratropium-albuterol (DUONEB) 0.5-2.5 (3) MG/3ML SOLN Take 3 mLs by nebulization every 4 (four) hours as needed (wheezing, coughing, SOB). 05/05/22  Yes Johnson, Clanford L, MD  losartan (COZAAR) 25 MG tablet Take 1 tablet (25 mg total) by mouth daily. 05/05/22  Yes Johnson, Clanford L, MD  metoprolol succinate (TOPROL-XL) 25 MG 24 hr tablet Take 1 tablet (25 mg total) by mouth daily. 04/30/21  Yes Paseda, Dewaine Conger, FNP  potassium chloride (KLOR-CON M) 10 MEQ tablet Take 1 tablet (10 mEq total) by mouth daily. 05/05/22  Yes Johnson, Clanford L, MD  predniSONE (DELTASONE) 20 MG tablet Take  3 PO QAM x5days, 2 PO QAM x5days, 1 PO QAM x5days 05/06/22  Yes Johnson, Clanford L, MD  rosuvastatin (CRESTOR) 10 MG tablet TAKE 1 TABLET BY MOUTH ONCE A DAY. 06/15/21  Yes Imogene Burn, PA-C  SYMBICORT 80-4.5 MCG/ACT inhaler INHALE 2 PUFFS TWICE DAILY 11/15/21  Yes Paseda, Dewaine Conger, FNP      Allergies    Latex, Tape, and Atorvastatin    Review of Systems   Review of Systems  Unable to perform ROS: Severe respiratory distress    Physical Exam Updated Vital Signs BP (!)  51/42   Pulse (!) 109   Temp (!) 104.9 F (40.5 C)   Resp (!) 22   Ht 1.702 m (5' 7"$ )   Wt 46.7 kg   SpO2 98%   BMI 16.13 kg/m  Physical Exam Constitutional:      General: She is in acute distress.     Appearance: She is ill-appearing and toxic-appearing.  Eyes:     Extraocular Movements: Extraocular movements intact.     Pupils: Pupils are equal, round, and reactive to light.  Cardiovascular:     Rate and Rhythm: Tachycardia present.  Pulmonary:     Effort: Tachypnea and respiratory distress present.     Breath sounds: Examination of the right-upper field reveals rales. Examination of the left-upper field reveals rales. Examination of the right-middle field reveals rales. Examination of the left-middle field reveals rales. Examination of the right-lower field reveals rales. Examination of the left-lower field reveals rales. Rales present.  Chest:     Chest wall: No tenderness or crepitus.  Abdominal:     Palpations: Abdomen is soft.  Musculoskeletal:     Cervical back: Normal range of motion and neck supple.     Right lower leg: No edema.     Left lower leg: No edema.  Neurological:     Comments: Initially would follow commands could say yes or no to appropriate information like her date of birth.  Moving all 4 extremities.     ED Results / Procedures / Treatments   Labs (all labs ordered are listed, but only abnormal results are displayed) Labs Reviewed  BLOOD GAS, VENOUS - Abnormal; Notable for the following components:      Result Value   pH, Ven 7.13 (*)    pCO2, Ven 114 (*)    pO2, Ven 76 (*)    Bicarbonate 35.5 (*)    Acid-Base Excess 3.4 (*)    All other components within normal limits  CBC WITH DIFFERENTIAL/PLATELET - Abnormal; Notable for the following components:   WBC 31.0 (*)    RDW 18.0 (*)    Neutro Abs 28.9 (*)    Lymphs Abs 0.5 (*)    Basophils Absolute 0.2 (*)    Abs Immature Granulocytes 0.82 (*)    All other components within normal limits   COMPREHENSIVE METABOLIC PANEL - Abnormal; Notable for the following components:   Sodium 131 (*)    Chloride 91 (*)    Glucose, Bld 130 (*)    BUN 45 (*)    Creatinine, Ser 1.63 (*)    Calcium 8.2 (*)    Albumin 2.7 (*)    AST 87 (*)    ALT 51 (*)    Total Bilirubin 3.2 (*)    GFR, Estimated 33 (*)    All other components within normal limits  LACTIC ACID, PLASMA - Abnormal; Notable for the following components:   Lactic Acid, Venous 3.6 (*)  All other components within normal limits  LACTIC ACID, PLASMA - Abnormal; Notable for the following components:   Lactic Acid, Venous 3.7 (*)    All other components within normal limits  TROPONIN I (HIGH SENSITIVITY) - Abnormal; Notable for the following components:   Troponin I (High Sensitivity) 1,077 (*)    All other components within normal limits  CULTURE, BLOOD (ROUTINE X 2)  CULTURE, BLOOD (ROUTINE X 2)  RESP PANEL BY RT-PCR (RSV, FLU A&B, COVID)  RVPGX2  BRAIN NATRIURETIC PEPTIDE  URINALYSIS, W/ REFLEX TO CULTURE (INFECTION SUSPECTED)  BLOOD GAS, ARTERIAL  TROPONIN I (HIGH SENSITIVITY)    EKG EKG Interpretation  Date/Time:  Thursday May 12 2022 17:23:06 EST Ventricular Rate:  117 PR Interval:  131 QRS Duration: 101 QT Interval:  267 QTC Calculation: 373 R Axis:   0 Text Interpretation: Sinus tachycardia Atrial premature complex LVH with secondary repolarization abnormality Anterior infarct, acute (LAD) Minimal ST elevation, inferior leads Similar to 04/25/22 but changes from 04/28/22 Questionable anterior MI Confirmed by Fredia Sorrow [phone]) on 05/02/2022 5:33:19 PM  Radiology DG Chest Port 1 View  Result Date: 05/04/2022 CLINICAL DATA:  Check for tube placement EXAM: PORTABLE CHEST 1 VIEW COMPARISON:  04/29/2022 FINDINGS: ET tube and enteric tube in place. Overlapping cardiac leads. Enlarged cardiopericardial silhouette. Hyperinflation. No pneumothorax. Persistent diffuse interstitial changes noted bilaterally  with some consolidative areas along the right lung base. No significant pleural effusion. Overall appearance is similar to previous. IMPRESSION: New ET tube and enteric tube in place. ET tube with the tip 4.9 cm above the carina. Enteric tube extends beneath the diaphragm Electronically Signed   By: Jill Side M.D.   On: 04/23/2022 19:08   DG Chest Port 1 View  Result Date: 05/14/2022 CLINICAL DATA:  Unresponsive EXAM: PORTABLE CHEST 1 VIEW COMPARISON:  05/03/2022 FINDINGS: Cardiac shadow is enlarged but stable. Aortic calcifications are noted. Lungs are well aerated bilaterally. Diffuse interstitial changes are again seen bilaterally but increased consistent with worsening edema. More focal right basilar density is noted consistent with new infiltrate in the medial base. No bony abnormality is noted. IMPRESSION: Worsening pulmonary edema when compare with the prior exam. Worsening infiltrate in the medial right lung base. Electronically Signed   By: Inez Catalina M.D.   On: 05/04/2022 18:35    Procedures Procedure Name: Intubation Date/Time: 05/06/2022 7:57 PM  Performed by: Fredia Sorrow, MDPre-anesthesia Checklist: Emergency Drugs available, Suction available, Timeout performed, Patient being monitored and Patient identified Oxygen Delivery Method: Ambu bag Preoxygenation: Pre-oxygenation with 100% oxygen Ventilation: Mask ventilation without difficulty Laryngoscope Size: Glidescope and 3 Tube size: 7.5 mm Number of attempts: 1 Airway Equipment and Method: Rigid stylet Placement Confirmation: ETT inserted through vocal cords under direct vision and CO2 detector Secured at: 24 cm Tube secured with: ETT holder        Medications Ordered in ED Medications  0.9 %  sodium chloride infusion ( Intravenous New Bag/Given 05/15/2022 1928)  acetaminophen (OFIRMEV) IV 1,000 mg (1,000 mg Intravenous New Bag/Given 04/21/2022 1831)  midazolam (VERSED) 100 mg/100 mL (1 mg/mL) premix infusion (0.5  mg/hr Intravenous New Bag/Given 05/17/2022 1940)  fentaNYL 2566mg in NS 2564m(1022mml) infusion-PREMIX (25 mcg/hr Intravenous Rate/Dose Change 05/17/2022 1947)  lactated ringers infusion (has no administration in time range)  lactated ringers bolus 1,000 mL (1,000 mLs Intravenous New Bag/Given 04/30/2022 1932)    And  lactated ringers bolus 500 mL (500 mLs Intravenous New Bag/Given 05/15/2022 1934)  vancomycin (VANCOCIN) IVPB 1000 mg/200  mL premix (has no administration in time range)  ceFEPIme (MAXIPIME) 2 g in sodium chloride 0.9 % 100 mL IVPB (has no administration in time range)  etomidate (AMIDATE) injection 20 mg (20 mg Intravenous Given by Other 04/28/2022 1943)  succinylcholine (ANECTINE) syringe 100 mg (100 mg Intravenous Given by Other 04/26/2022 1844)  furosemide (LASIX) injection 80 mg (80 mg Intravenous Given 05/08/2022 1927)    ED Course/ Medical Decision Making/ A&P                             Medical Decision Making Amount and/or Complexity of Data Reviewed Labs: ordered. Radiology: ordered.  Risk Prescription drug management.   CRITICAL CARE Performed by: Fredia Sorrow Total critical care time: 95 minutes Critical care time was exclusive of separately billable procedures and treating other patients. Critical care was necessary to treat or prevent imminent or life-threatening deterioration. Critical care was time spent personally by me on the following activities: development of treatment plan with patient and/or surrogate as well as nursing, discussions with consultants, evaluation of patient's response to treatment, examination of patient, obtaining history from patient or surrogate, ordering and performing treatments and interventions, ordering and review of laboratory studies, ordering and review of radiographic studies, pulse oximetry and re-evaluation of patient's condition.  Discussed with Audria Nine intensivist who accept patient for admission to [facility].  Needs to go to  [facility] because could require cardiac catheterization for the elevated troponins.  They recommend giving another liter of fluid and starting her on norepinephrine did not want to start dobutamine without her having a central line.  If there is significant delay on bed availability patient will be transferred ED to ED.  Patient was intubated without difficulty.  Following intubation blood pressures were low patient receiving fluids critical care specialist Audria Nine recommending a given extra liter of fluid as stated above and starting norepinephrine.  Patient started on antibiotics for hospital-acquired pneumonia.  Patient started on sepsis protocol.  Patient's labs came back lactic acid 3.6 white blood cell count 30,000.  And troponin 1077.  Again patient not stable enough to go to the Cath Lab at this point in time.  Chest x-ray raised worsening pulmonary edema compared to prior exam and that worsening infiltrate in the medial right lung base.  Patient's complete metabolic panel is back now potassium 4.4 sodium 131 chloride 91 glucose 130 BUN 45 creatinine 1.63 for a GFR of 33.  Total bili elevated AST ALT up just a little bit anion gap normal.  BNP pending.  Patient's venous blood gas had a pH 7.13 with pCO2 114 which makes sense and was probably the reason for intubation.   Patient also had cooling blanket ordered.  Patient had temp Foley placed.  Temp is still elevated patient received IV Tylenol.  Will monitor her blood pressures.  Final Clinical Impression(s) / ED Diagnoses Final diagnoses:  Acute on chronic respiratory failure with hypoxia and hypercapnia (HCC)  HCAP (healthcare-associated pneumonia)  Sepsis, due to unspecified organism, unspecified whether acute organ dysfunction present (Chloride)  Elevated troponin    Rx / DC Orders ED Discharge Orders     None         Fredia Sorrow, MD 05/01/2022 2019

## 2022-05-12 NOTE — ED Notes (Signed)
Pt intubated by Dr. Rogene Houston with 7.5 ETT, secured at 24cm at the lip. Positive color change on EtCO2 detector. Bilateral breath sounds auscultated.

## 2022-05-12 NOTE — Telephone Encounter (Signed)
The following Staff Message was received:  Ilean China, RN  P [facility] Pool Patient was discharged from inpatient stay on 2L of oxygen. She is using 3L during the day and 4L at night. It may have been adjusted by [employer] due to floor concentrator tubing length, but I'm not sure. She does not feel short of breath. She does have a pulse ox but it doesn't stay on very long. She is going to change the battery to see if that will help so that she can check and record her O2 levels.  She has an appt with Dr Melvyn Novas in the [place] office on 05/19/22. Can someone reach out to her and verify whether she is using the correct flow rate or provide her with guidance on what that should be set at?  Please let me know if I can be of any assistance.  Thank you,  [name], BSN, RN-BC [place]: [phone] Main #: [phone]  ATC LVMTCB X1

## 2022-05-12 NOTE — Patient Outreach (Signed)
Care Coordination   Initial Visit Note   04/26/2022 Name: Leslie [name] MRN: [id number] DOB: [birth year]  Leslie [name] is a 77 y.o. year old female who sees Leslie [name], [place] for primary care. I spoke with  Leslie [name] by phone today.  What matters to the patients health and wellness today?  Managing heart failure, obtaining DME equipment, proper use of supplemental oxgen    Goals Addressed             This Visit's Progress    Care Coordination Services       Care Coordination Goals: Patient will obtain scales for daily weights Patient will weigh and record weight each  morning after urinating Patient will call PCP with any weight gain >3 lbs overnight or >5 lbs in one week Patient will keep all scheduled f/u appts:  Dr Melvyn Novas (pulm) on 05/19/22 Leslie Nordmann, NP (cardio) on 06/02/22 Leslie Monday, FNP on 06/07/22 for 4 week f/u Patient will reach out to PCP with any new or worsening symptoms Patient will talk with niece about the prices she was given by [employer] for a shower chair and elevated toilet seat  Patient will replace battery in pulse ox and check and record oxygen level as needed Patient will monitor her legs, feet, and abdomen for any increased swelling and will call PCP with any changes Patient will talk with [facility] about the correct flow rate for her oxygen Patient will continue taking lasix 65m daily and all other mediations as prescribed Patient will keep telephone f/u with Leslie Poling RN Care Coordinator [phone] 05/26/22 at 10:30 Patient will reach out to PCP or seek emergency medical attention if necessary for any new or worsening symptoms Patient will call this RN Care Manager at [phone] any resource or care coordination needs prior to her telephone appt with Leslie Quails RN        SDOH assessments and interventions completed:  Yes  SDOH Interventions Today    Flowsheet Row Most Recent Value  SDOH Interventions    Transportation Interventions Intervention Not Indicated  Financial Strain Interventions Intervention Not Indicated        Care Coordination Interventions:  Yes, provided  Interventions Today    Flowsheet Row Most Recent Value  Chronic Disease   Chronic disease during today's visit Congestive Heart Failure (CHF)  [acute respiratory failure with hypoxia]  General Interventions   General Interventions Discussed/Reviewed General Interventions Discussed, General Interventions Reviewed, Communication with, Durable Medical Equipment (DME), Referral to Nurse, Labs  [facility]  Doctor Visits Discussed/Reviewed Doctor Visits Discussed, Doctor Visits Reviewed, Specialist  [reviewed upcoming appts and verified that patient has transportation]  Durable Medical Equipment (DME) Oxygen, Shower bench, Other  [conference call placed to [employer] Verified that they received order for shower bench and elevated commode seat, but that ins does not cover and cost will be out of pocket. Pt's niece was given info on cost yesterday. Pt will f/u with her.]  PCP/Specialist Visits Compliance with follow-up visit  Communication with PCP/Specialists  [[facility] via Staff Message regading correct oxygen flow rate. Patient is using 3L via [place] from floor concentrator during the day and 4L at night. She was discharged on 2L. Asked pulm to reach out to patient to clarify instructions.]  Education Interventions   Education Provided Provided Education  [daily weights, monitor for edema, use of pulse ox, use of supplemental oxygen (pt should get a call from   Pulmonary to clarify flow rate)]  Provided Verbal Education On Medication, When to see the doctor  [Lasix 27m]  Nutrition Interventions   Nutrition Discussed/Reviewed Decreasing salt, Nutrition Reviewed  Pharmacy Interventions   Pharmacy Dicussed/Reviewed Medications and their functions       Follow up plan: Follow up  call scheduled for 05/26/22 with Leslie Poling RN Care Coordinator [phone]   Encounter Outcome:  Pt. Visit Completed   KChong Sicilian BSN, RN-BC RN Care Coordinator [facility] [place]: [phone] #: [phone]

## 2022-05-12 NOTE — ED Notes (Signed)
Family remains at bedside.

## 2022-05-12 NOTE — ED Triage Notes (Signed)
Pt brought in by [facility] for c/o sob and then pt became unresponsive en route; pt was found by ems to have O2 sats in the 60's; ems applied cpap; just as pt arrived to ED pt became responsive and is alert; pt denies any pain

## 2022-05-12 NOTE — Sepsis Progress Note (Signed)
Following for sepsis monitoring ?

## 2022-05-12 NOTE — ED Notes (Signed)
[facility] arrived  

## 2022-05-12 NOTE — Progress Notes (Signed)
Report called to [facility] RT covering 39M.

## 2022-05-12 NOTE — Progress Notes (Signed)
Pharmacy Antibiotic Note  EHRIN SHAHEED is a 77 y.o. female admitted on 04/27/2022 with  HCAP .  Pharmacy has been consulted for Cefepime and Vancomycin dosing x 7 days. Also covering for sepsis. Recent hospitalization (2/2>>05/05/22) with respiratory failure, CHF and COPD; did not receive antibiotics during that admission.  Intubated in ED.  Blood cultures sent. Cefepime 2gm and Vancomycin 1gm IV given in ED. Creatinine is up some from prior admit.  Plan: Cefepime 2gm IV q24hrs. Vancomycin 750 mg IV Q 48 hrs.  Goal AUC 400-550. Expected AUC: 492 SCr used: 1.63, TBW (since < IBW) Will follow renal function for any need to modify antibiotic regimens. Follow culture data, clinical status and antibiotic plans. Current plan is for 7 days of antibiotics.   Height: 5' 7"$  (170.2 cm) Weight: 46.7 kg (102 lb 15.3 oz) IBW/kg (Calculated) : 61.6  Temp (24hrs), Avg:103.1 F (39.5 C), Min:100.2 F (37.9 C), Max:106 F (41.1 C)  Recent Labs  Lab 05/10/22 1526 05/09/2022 1744 05/14/2022 1919  WBC  --  31.0*  --   CREATININE 1.33* 1.63*  --   LATICACIDVEN  --  3.6* 3.7*    Estimated Creatinine Clearance: 22 mL/min (A) (by C-G formula based on SCr of 1.63 mg/dL (H)).    Allergies  Allergen Reactions   Latex Other (See Comments)    Burns skin    Tape Other (See Comments) and Itching    Burns skin    Atorvastatin Other (See Comments)    myalgia myalgia    Antimicrobials this admission: Cefepime 2/22>> Vancomycin 2/22 >>  Dose adjustments this admission: n/a  Microbiology results: 2/22 blood: sent 2/22 respiratory panel: sent 2/22 urine: sent  Thank you for allowing pharmacy to be a part of this patient's care.  Arty Baumgartner, [place] 04/23/2022 9:14 PM

## 2022-05-12 NOTE — Sepsis Progress Note (Signed)
Sepsis interventions complete. Pt now en route to [facility] ICU. Will close sepsis and monitor upon arrival.

## 2022-05-13 ENCOUNTER — Inpatient Hospital Stay (HOSPITAL_COMMUNITY): Payer: Medicare HMO

## 2022-05-13 DIAGNOSIS — J9622 Acute and chronic respiratory failure with hypercapnia: Secondary | ICD-10-CM

## 2022-05-13 DIAGNOSIS — R579 Shock, unspecified: Secondary | ICD-10-CM | POA: Diagnosis not present

## 2022-05-13 DIAGNOSIS — J9621 Acute and chronic respiratory failure with hypoxia: Secondary | ICD-10-CM

## 2022-05-13 DIAGNOSIS — E440 Moderate protein-calorie malnutrition: Secondary | ICD-10-CM | POA: Insufficient documentation

## 2022-05-13 DIAGNOSIS — J9601 Acute respiratory failure with hypoxia: Secondary | ICD-10-CM

## 2022-05-13 LAB — BLOOD CULTURE ID PANEL (REFLEXED) - BCID2

## 2022-05-13 LAB — STREP PNEUMONIAE URINARY ANTIGEN: Strep Pneumo Urinary Antigen: NEGATIVE

## 2022-05-13 LAB — COMPREHENSIVE METABOLIC PANEL
ALT: 49 U/L — ABNORMAL HIGH (ref 0–44)
AST: 102 U/L — ABNORMAL HIGH (ref 15–41)
Albumin: 1.9 g/dL — ABNORMAL LOW (ref 3.5–5.0)
Alkaline Phosphatase: 65 U/L (ref 38–126)
Anion gap: 14 (ref 5–15)
BUN: 42 mg/dL — ABNORMAL HIGH (ref 8–23)
CO2: 22 mmol/L (ref 22–32)
Calcium: 8.1 mg/dL — ABNORMAL LOW (ref 8.9–10.3)
Chloride: 91 mmol/L — ABNORMAL LOW (ref 98–111)
Creatinine, Ser: 1.75 mg/dL — ABNORMAL HIGH (ref 0.44–1.00)
GFR, Estimated: 30 mL/min — ABNORMAL LOW (ref >60)
Glucose, Bld: 196 mg/dL — ABNORMAL HIGH (ref 70–99)
Potassium: 3.7 mmol/L (ref 3.5–5.1)
Sodium: 127 mmol/L — ABNORMAL LOW (ref 135–145)
Total Bilirubin: 3.8 mg/dL — ABNORMAL HIGH (ref 0.3–1.2)
Total Protein: 4.8 g/dL — ABNORMAL LOW (ref 6.5–8.1)

## 2022-05-13 LAB — TROPONIN I (HIGH SENSITIVITY)
Troponin I (High Sensitivity): 1078 ng/L (ref <18)
Troponin I (High Sensitivity): 988 ng/L (ref <18)
Troponin I (High Sensitivity): 798 ng/L (ref <18)
Troponin I (High Sensitivity): 754 ng/L (ref <18)

## 2022-05-13 LAB — PROCALCITONIN: Procalcitonin: 76.59 ng/mL

## 2022-05-13 LAB — CBC WITH DIFFERENTIAL/PLATELET
Abs Immature Granulocytes: 0 10*3/uL (ref 0.00–0.07)
Basophils Absolute: 0 10*3/uL (ref 0.0–0.1)
Basophils Relative: 0 %
Eosinophils Absolute: 0 10*3/uL (ref 0.0–0.5)
Eosinophils Relative: 0 %
HCT: 36.5 % (ref 36.0–46.0)
Hemoglobin: 12.1 g/dL (ref 12.0–15.0)
Lymphocytes Relative: 4 %
Lymphs Abs: 0.7 10*3/uL (ref 0.7–4.0)
MCH: 27.9 pg (ref 26.0–34.0)
MCHC: 33.2 g/dL (ref 30.0–36.0)
MCV: 84.3 fL (ref 80.0–100.0)
Monocytes Absolute: 0.3 10*3/uL (ref 0.1–1.0)
Monocytes Relative: 2 %
Neutro Abs: 16 10*3/uL — ABNORMAL HIGH (ref 1.7–7.7)
Neutrophils Relative %: 94 %
Platelets: 138 10*3/uL — ABNORMAL LOW (ref 150–400)
RBC: 4.33 MIL/uL (ref 3.87–5.11)
RDW: 17.7 % — ABNORMAL HIGH (ref 11.5–15.5)
WBC: 17 10*3/uL — ABNORMAL HIGH (ref 4.0–10.5)
nRBC: 0 % (ref 0.0–0.2)
nRBC: 0 /100 WBC

## 2022-05-13 LAB — GLUCOSE, CAPILLARY
Glucose-Capillary: 137 mg/dL — ABNORMAL HIGH (ref 70–99)
Glucose-Capillary: 143 mg/dL — ABNORMAL HIGH (ref 70–99)
Glucose-Capillary: 156 mg/dL — ABNORMAL HIGH (ref 70–99)
Glucose-Capillary: 179 mg/dL — ABNORMAL HIGH (ref 70–99)
Glucose-Capillary: 195 mg/dL — ABNORMAL HIGH (ref 70–99)
Glucose-Capillary: 197 mg/dL — ABNORMAL HIGH (ref 70–99)
Glucose-Capillary: 222 mg/dL — ABNORMAL HIGH (ref 70–99)

## 2022-05-13 LAB — AMMONIA: Ammonia: 28 umol/L (ref 9–35)

## 2022-05-13 LAB — LACTIC ACID, PLASMA
Lactic Acid, Venous: 4.2 mmol/L (ref 0.5–1.9)
Lactic Acid, Venous: 3.4 mmol/L (ref 0.5–1.9)
Lactic Acid, Venous: 3.6 mmol/L (ref 0.5–1.9)
Lactic Acid, Venous: 4 mmol/L (ref 0.5–1.9)
Lactic Acid, Venous: 2.9 mmol/L (ref 0.5–1.9)
Lactic Acid, Venous: 2.3 mmol/L (ref 0.5–1.9)

## 2022-05-13 LAB — MRSA NEXT GEN BY PCR, NASAL: MRSA by PCR Next Gen: NOT DETECTED

## 2022-05-13 LAB — MAGNESIUM
Magnesium: 1.5 mg/dL — ABNORMAL LOW (ref 1.7–2.4)
Magnesium: 2.8 mg/dL — ABNORMAL HIGH (ref 1.7–2.4)

## 2022-05-13 LAB — PHOSPHORUS: Phosphorus: 4.8 mg/dL — ABNORMAL HIGH (ref 2.5–4.6)

## 2022-05-13 MED ORDER — SODIUM CHLORIDE 0.9 % IV SOLN: INTRAVENOUS | Status: DC

## 2022-05-13 MED ORDER — NOREPINEPHRINE 16 MG/250ML-% IV SOLN
0.0000 ug/min | INTRAVENOUS | Status: DC
  Administered 2022-05-13: 14 ug/min via INTRAVENOUS
  Administered 2022-05-14: 8 ug/min via INTRAVENOUS
  Administered 2022-05-14: 30 ug/min via INTRAVENOUS
  Administered 2022-05-15: 5 ug/min via INTRAVENOUS
  Administered 2022-05-16: 6 ug/min via INTRAVENOUS
  Filled 2022-05-13 (×4): qty 250

## 2022-05-13 MED ORDER — FENTANYL CITRATE PF 50 MCG/ML IJ SOSY
25.0000 ug | PREFILLED_SYRINGE | INTRAMUSCULAR | Status: DC | PRN
  Administered 2022-05-13: 50 ug via INTRAVENOUS
  Administered 2022-05-14: 100 ug via INTRAVENOUS
  Administered 2022-05-15 (×3): 50 ug via INTRAVENOUS
  Filled 2022-05-13: qty 1
  Filled 2022-05-13: qty 2
  Filled 2022-05-13: qty 1
  Filled 2022-05-13: qty 2
  Filled 2022-05-13: qty 1

## 2022-05-13 MED ORDER — REVEFENACIN 175 MCG/3ML IN SOLN
175.0000 ug | Freq: Every day | RESPIRATORY_TRACT | Status: DC
  Administered 2022-05-13 – 2022-05-15 (×3): 175 ug via RESPIRATORY_TRACT
  Filled 2022-05-13 (×3): qty 3

## 2022-05-13 MED ORDER — SODIUM CHLORIDE 0.9 % IV SOLN: INTRAVENOUS | Status: DC | PRN

## 2022-05-13 MED ORDER — FUROSEMIDE 10 MG/ML IJ SOLN
40.0000 mg | Freq: Once | INTRAMUSCULAR | Status: DC
  Filled 2022-05-13: qty 4

## 2022-05-13 MED ORDER — CEFAZOLIN SODIUM-DEXTROSE 2-4 GM/100ML-% IV SOLN
2.0000 g | Freq: Two times a day (BID) | INTRAVENOUS | Status: DC
  Administered 2022-05-13 – 2022-05-15 (×4): 2 g via INTRAVENOUS
  Filled 2022-05-13 (×5): qty 100

## 2022-05-13 MED ORDER — IPRATROPIUM-ALBUTEROL 0.5-2.5 (3) MG/3ML IN SOLN
3.0000 mL | Freq: Four times a day (QID) | RESPIRATORY_TRACT | Status: DC | PRN
  Administered 2022-05-15: 3 mL via RESPIRATORY_TRACT
  Filled 2022-05-13: qty 3

## 2022-05-13 MED ORDER — CHLORHEXIDINE GLUCONATE CLOTH 2 % EX PADS
6.0000 | MEDICATED_PAD | Freq: Every day | CUTANEOUS | Status: DC
  Administered 2022-05-13 – 2022-05-15 (×3): 6 via TOPICAL

## 2022-05-13 MED ORDER — INSULIN ASPART 100 UNIT/ML IJ SOLN
0.0000 [IU] | Freq: Three times a day (TID) | INTRAMUSCULAR | Status: DC
  Administered 2022-05-13: 3 [IU] via SUBCUTANEOUS

## 2022-05-13 MED ORDER — DOBUTAMINE IN D5W 4-5 MG/ML-% IV SOLN
2.5000 ug/kg/min | INTRAVENOUS | Status: DC
  Administered 2022-05-13: 2.5 ug/kg/min via INTRAVENOUS
  Filled 2022-05-13 (×2): qty 250

## 2022-05-13 MED ORDER — BUDESONIDE 0.25 MG/2ML IN SUSP
0.2500 mg | Freq: Two times a day (BID) | RESPIRATORY_TRACT | Status: DC
  Administered 2022-05-13 – 2022-05-15 (×5): 0.25 mg via RESPIRATORY_TRACT
  Filled 2022-05-13 (×5): qty 2

## 2022-05-13 MED ORDER — MAGNESIUM SULFATE 4 GM/100ML IV SOLN
4.0000 g | Freq: Once | INTRAVENOUS | Status: AC
  Administered 2022-05-13: 4 g via INTRAVENOUS
  Filled 2022-05-13: qty 100

## 2022-05-13 MED ORDER — FAMOTIDINE 20 MG PO TABS
20.0000 mg | ORAL_TABLET | Freq: Every day | ORAL | Status: DC
  Administered 2022-05-13: 20 mg via ORAL
  Filled 2022-05-13: qty 1

## 2022-05-13 MED ORDER — INSULIN ASPART 100 UNIT/ML IJ SOLN
0.0000 [IU] | INTRAMUSCULAR | Status: DC
  Administered 2022-05-13: 1 [IU] via SUBCUTANEOUS
  Administered 2022-05-13: 2 [IU] via SUBCUTANEOUS
  Administered 2022-05-13: 1 [IU] via SUBCUTANEOUS
  Administered 2022-05-13: 2 [IU] via SUBCUTANEOUS
  Administered 2022-05-14 (×2): 1 [IU] via SUBCUTANEOUS
  Administered 2022-05-14: 7 [IU] via SUBCUTANEOUS
  Administered 2022-05-14: 3 [IU] via SUBCUTANEOUS

## 2022-05-13 MED ORDER — DEXAMETHASONE SODIUM PHOSPHATE 10 MG/ML IJ SOLN
10.0000 mg | Freq: Two times a day (BID) | INTRAMUSCULAR | Status: DC
  Administered 2022-05-13 – 2022-05-16 (×7): 10 mg via INTRAVENOUS
  Filled 2022-05-13 (×8): qty 1

## 2022-05-13 MED ORDER — HEPARIN SODIUM (PORCINE) 5000 UNIT/ML IJ SOLN
5000.0000 [IU] | Freq: Three times a day (TID) | INTRAMUSCULAR | Status: DC
  Administered 2022-05-13 – 2022-05-15 (×7): 5000 [IU] via SUBCUTANEOUS
  Filled 2022-05-13 (×6): qty 1

## 2022-05-13 MED ORDER — ARFORMOTEROL TARTRATE 15 MCG/2ML IN NEBU
15.0000 ug | INHALATION_SOLUTION | Freq: Two times a day (BID) | RESPIRATORY_TRACT | Status: DC
  Administered 2022-05-13 – 2022-05-15 (×5): 15 ug via RESPIRATORY_TRACT
  Filled 2022-05-13 (×7): qty 2

## 2022-05-13 MED ORDER — FENTANYL CITRATE PF 50 MCG/ML IJ SOSY
25.0000 ug | PREFILLED_SYRINGE | INTRAMUSCULAR | Status: DC | PRN
  Filled 2022-05-13: qty 1

## 2022-05-13 MED ORDER — INSULIN ASPART 100 UNIT/ML IJ SOLN: 0.0000 [IU] | Freq: Every day | INTRAMUSCULAR | Status: DC

## 2022-05-13 MED ORDER — PROSOURCE TF20 ENFIT COMPATIBL EN LIQD
60.0000 mL | Freq: Every day | ENTERAL | Status: DC
  Administered 2022-05-13 – 2022-05-16 (×4): 60 mL
  Filled 2022-05-13 (×4): qty 60

## 2022-05-13 MED ORDER — VASOPRESSIN 20 UNITS/100 ML INFUSION FOR SHOCK
0.0000 [IU]/min | INTRAVENOUS | Status: DC
  Administered 2022-05-13 – 2022-05-16 (×4): 0.03 [IU]/min via INTRAVENOUS
  Filled 2022-05-13 (×5): qty 100

## 2022-05-13 MED ORDER — ORAL CARE MOUTH RINSE: 15.0000 mL | OROMUCOSAL | Status: DC | PRN

## 2022-05-13 MED ORDER — FUROSEMIDE 10 MG/ML IJ SOLN
80.0000 mg | Freq: Once | INTRAMUSCULAR | Status: AC
  Administered 2022-05-13: 80 mg via INTRAVENOUS
  Filled 2022-05-13: qty 8

## 2022-05-13 MED ORDER — SODIUM CHLORIDE 0.9 % IV SOLN
500.0000 mg | INTRAVENOUS | Status: DC
  Administered 2022-05-13: 500 mg via INTRAVENOUS
  Filled 2022-05-13: qty 5

## 2022-05-13 MED ORDER — ORAL CARE MOUTH RINSE
15.0000 mL | OROMUCOSAL | Status: DC
  Administered 2022-05-13 – 2022-05-16 (×41): 15 mL via OROMUCOSAL

## 2022-05-13 MED ORDER — OSMOLITE 1.5 CAL PO LIQD
1000.0000 mL | ORAL | Status: DC
  Administered 2022-05-13 – 2022-05-16 (×3): 1000 mL
  Filled 2022-05-13 (×5): qty 1000

## 2022-05-13 MED ORDER — FAMOTIDINE 20 MG PO TABS
10.0000 mg | ORAL_TABLET | Freq: Every day | ORAL | Status: DC
  Administered 2022-05-14 – 2022-05-16 (×3): 10 mg via ORAL
  Filled 2022-05-13 (×3): qty 1

## 2022-05-13 MED ORDER — DOCUSATE SODIUM 50 MG/5ML PO LIQD
100.0000 mg | Freq: Two times a day (BID) | ORAL | Status: DC
  Administered 2022-05-13 – 2022-05-15 (×4): 100 mg
  Filled 2022-05-13 (×4): qty 10

## 2022-05-13 MED ORDER — POLYETHYLENE GLYCOL 3350 17 G PO PACK
17.0000 g | PACK | Freq: Every day | ORAL | Status: DC
  Administered 2022-05-13 – 2022-05-14 (×2): 17 g
  Filled 2022-05-13 (×2): qty 1

## 2022-05-13 MED ORDER — INSULIN ASPART 100 UNIT/ML IJ SOLN
2.0000 [IU] | INTRAMUSCULAR | Status: DC
  Administered 2022-05-14 – 2022-05-15 (×5): 2 [IU] via SUBCUTANEOUS

## 2022-05-13 MED ORDER — VASOPRESSIN 20 UNITS/100 ML INFUSION FOR SHOCK
INTRAVENOUS | Status: AC
  Administered 2022-05-13: 0.03 [IU]/min via INTRAVENOUS
  Filled 2022-05-13: qty 100

## 2022-05-13 NOTE — Progress Notes (Signed)
[facility] ADULT ICU REPLACEMENT PROTOCOL   The patient does apply for the [facility] Adult ICU Electrolyte Replacment Protocol based on the criteria listed below:   1.Exclusion criteria: TCTS, ECMO, Dialysis, and Myasthenia Gravis patients 2. Is GFR >/= 30 ml/min? Yes.    Patient's GFR today is 30 3. Is SCr </= 2? Yes.   Patient's SCr is 1.75 mg/dL 4. Did SCr increase >/= 0.5 in 24 hours? No. 5.Pt's weight >40kg  Yes.   6. Abnormal electrolyte(s): mag 1.5  7. Electrolytes replaced per protocol 8.  Call MD STAT for K+ </= 2.5, Phos </= 1, or Mag </= 1 Physician:  protocol  Darlys Gales 05/13/2022 4:39 AM

## 2022-05-13 NOTE — IPAL (Signed)
  Interdisciplinary Goals of Care Family Meeting   Date carried out: 05/13/2022  Location of the meeting: Conference room  Member's involved: Nurse Practitioner and Family Member or next of kin  Durable Power of Attorney or acting medical decision maker: Patient's daughter, but multiple other family members are also present. (Nieces and nephews)  Discussion: We discussed goals of care for Leslie [name]. Shalanda has been clear with family in the past she would not want prolonged life support. Family understands [name] is critically ill requiring mechanical ventilation and high multiple vasopressors. They also understand adding cardiac arrest on top of this would almost certainly result in prolonged life support measures. They state if her heart were to stop in the hospital we should allow her to pass away peacefully.     Code status:   Code Status: DNR   Disposition: Continue current acute care  Time spent for the meeting: 22 minutes   [name], AGACNP-BC [place] for personal pager PCCM on call pager [phone] until 7pm. Please call Elink 7p-7a. [phone]  05/13/2022 1:00 AM

## 2022-05-13 NOTE — Progress Notes (Signed)
Wasted 16m of Fentanyl drip and 712mof Versed drip in Stericycle in 82M Med room with Amrah, RN as witness.

## 2022-05-13 NOTE — Progress Notes (Signed)
Pt taken and returned from CT, no incidents occurred.

## 2022-05-13 NOTE — Progress Notes (Signed)
PHARMACY - PHYSICIAN COMMUNICATION CRITICAL VALUE ALERT - BLOOD CULTURE IDENTIFICATION (BCID)  Leslie [name] is an 77 y.o. female who presented to [facility] on 04/26/2022 with a chief complaint of covid pneumonia  Assessment:  77 year old female with staph bacteremia.   Name of physician (or Provider) Contacted: Agarwala  Current antibiotics: cefepime  Changes to prescribed antibiotics recommended:  Recommendations accepted by provider Will narrow antibiotics to ancef.   Results for orders placed or performed during the hospital encounter of 05/06/2022  Blood Culture ID Panel (Reflexed) (Collected: 05/03/2022  6:42 PM)  Result Value Ref Range   Enterococcus faecalis NOT DETECTED NOT DETECTED   Enterococcus Faecium NOT DETECTED NOT DETECTED   Listeria monocytogenes NOT DETECTED NOT DETECTED   Staphylococcus species DETECTED (A) NOT DETECTED   Staphylococcus aureus (BCID) DETECTED (A) NOT DETECTED   Staphylococcus epidermidis NOT DETECTED NOT DETECTED   Staphylococcus lugdunensis NOT DETECTED NOT DETECTED   Streptococcus species NOT DETECTED NOT DETECTED   Streptococcus agalactiae NOT DETECTED NOT DETECTED   Streptococcus pneumoniae NOT DETECTED NOT DETECTED   Streptococcus pyogenes NOT DETECTED NOT DETECTED   A.calcoaceticus-baumannii NOT DETECTED NOT DETECTED   Bacteroides fragilis NOT DETECTED NOT DETECTED   Enterobacterales NOT DETECTED NOT DETECTED   Enterobacter cloacae complex NOT DETECTED NOT DETECTED   Escherichia coli NOT DETECTED NOT DETECTED   Klebsiella aerogenes NOT DETECTED NOT DETECTED   Klebsiella oxytoca NOT DETECTED NOT DETECTED   Klebsiella pneumoniae NOT DETECTED NOT DETECTED   Proteus species NOT DETECTED NOT DETECTED   Salmonella species NOT DETECTED NOT DETECTED   Serratia marcescens NOT DETECTED NOT DETECTED   Haemophilus influenzae NOT DETECTED NOT DETECTED   Neisseria meningitidis NOT DETECTED NOT DETECTED   Pseudomonas aeruginosa NOT DETECTED NOT  DETECTED   Stenotrophomonas maltophilia NOT DETECTED NOT DETECTED   Candida albicans NOT DETECTED NOT DETECTED   Candida auris NOT DETECTED NOT DETECTED   Candida glabrata NOT DETECTED NOT DETECTED   Candida krusei NOT DETECTED NOT DETECTED   Candida parapsilosis NOT DETECTED NOT DETECTED   Candida tropicalis NOT DETECTED NOT DETECTED   Cryptococcus neoformans/gattii NOT DETECTED NOT DETECTED   Meth resistant mecA/C and MREJ NOT DETECTED NOT DETECTED   Erin Hearing PharmD., BCPS Clinical Pharmacist 05/13/2022 5:40 PM

## 2022-05-13 NOTE — Progress Notes (Addendum)
eLink Physician-Brief Progress Note Patient Name: Leslie [name] DOB: Dec 10, 1945 MRN: [id number]   Date of Service  05/13/2022  HPI/Events of Note  77 year old female with a history of COPD that was brought into the emergency department with worsening dyspnea thought to be COPD exacerbation found to have septic shock secondary to HCAP.  She has right lower lobe infiltrate and significant pulmonary edema.  She was intubated in the emergency department and brought up to the intensive care unit for further management.  Currently on norepinephrine, received vancomycin and cefepime.  Positive for COVID, infiltrates may represent evolving COVID-19 pneumonia.  eICU Interventions  Hypotensive on bedside evaluation, ground team is about to enter the room, ordering vasopressin as an additional vasopressor.  Maxed out on nor epi at 25 through peripheral.  Anticipate placement of a central line.  Trend lactates and troponins.  Add on procalcitonin.  Urine Legionella/strep testing.  Nasal MRSA.  Continue vancomycin/cefepime.  Will continue to monitor    0445 - troponin 1078, lactic acid 4.2, sodium 127; start NS 100cc/hr for 10 hours. Defer further trop checks  Intervention Category Intermediate Interventions: Hypotension - evaluation and management Evaluation Type: New Patient Evaluation  Lameeka Schleifer 05/13/2022, 12:30 AM

## 2022-05-13 NOTE — Procedures (Signed)
Arterial Catheter Insertion Procedure Note  Leslie [name]  [id number]  11/30/1945  Date:05/13/22  Time:2:22 AM    Provider Performing: Corey Harold    Procedure: Insertion of Arterial Line [phone]) with US guidance [id number]   Indication(s) Blood pressure monitoring and/or need for frequent ABGs  Consent Risks of the procedure as well as the alternatives and risks of each were explained to the patient and/or caregiver.  Consent for the procedure was obtained and is signed in the bedside chart  Anesthesia None   Time Out Verified patient identification, verified procedure, site/side was marked, verified correct patient position, special equipment/implants available, medications/allergies/relevant history reviewed, required imaging and test results available.   Sterile Technique Maximal sterile technique including full sterile barrier drape, hand hygiene, sterile gown, sterile gloves, mask, hair covering, sterile ultrasound probe cover (if used).   Procedure Description Area of catheter insertion was cleaned with chlorhexidine and draped in sterile fashion. With real-time ultrasound guidance an arterial catheter was placed into the left radial artery.  Appropriate arterial tracings confirmed on monitor.     Complications/Tolerance None; patient tolerated the procedure well.   EBL Minimal   Specimen(s) None   [name], AGACNP-BC [place] [facility]  See Amion for personal pager PCCM on call pager [phone] until 7pm. Please call Elink 7p-7a. [phone]  05/13/2022 2:22 AM

## 2022-05-13 NOTE — Progress Notes (Signed)
NAME:  Leslie [name], MRN:  [id number], DOB:  [birth year], LOS: 1 ADMISSION DATE:  05/17/2022, CONSULTATION DATE:  05/13/22 REFERRING MD:  EDP, CHIEF COMPLAINT:  Acute hypoxic hypercarbic respiratory failure   History of Present Illness:  77 yo female, with pmh copd, hypertrophic CM and frequent hospitalizations. Presented after recent discharge on 05/05/22 for reportedly acutely decompensated heart failure. Per chart review pt originally called ems for sob and was given breathing nebulizers and improved. However subsequently pt decompensated and called EMS again which prompted transfer to ED.     Pt was initially noted to be in resp distress req NIV support but became poorly responsive on such and required intubation.  Aftter  pt intubated she was noted to be hypotensive and febrile to 105F. Septic work up ensued at osh and she was transferred to [facility] for further eval in light of her trop of reportedly > 1000. Pt presented on >20 mcg levo via piv, intubated on100% and 8 peep. She was unknowingly p ositive with COVID (prior to transfer).     History and ROS is unobtainable 2/2 pt's intubated and sedated condition.  Pertinent  Medical History  Active smoker COPD Hypertrophic cardiomyopathy Pulmonary hypertension HTN DM Recent d/c 05/05/22 for decompensated HF with resolution of R pleural effusion   Significant Hospital Events: Including procedures, antibiotic start and stop dates in addition to other pertinent events   2/22 transferred from OSH 2/23 admitted to ICU On fentanyl, versed for sedation On vasopressin and levophed CT head without acute abnormality CT chest with pulmonary edema, bilateral opacities, and small R pleural effusion   Interim History / Subjective:  No change in neurologic status. Discontinued continuous versed and fentanyl as switched to PRN. Now off of vasopressin, continues on Levophed Decreased FiO2 with good oxygenation   Objective   Blood pressure 107/68,  pulse 66, temperature 97.9 F (36.6 C), resp. rate (!) 22, height '5\' 7"'$  (1.702 m), weight 62.6 kg, SpO2 96 %.    Vent Mode: PRVC FiO2 (%):  [55 %-100 %] 55 % Set Rate:  [22 bmp] 22 bmp Vt Set:  [490 mL] 490 mL PEEP:  [8 cmH20] 8 cmH20 Plateau Pressure:  [26 cmH20-29 cmH20] 27 cmH20  SPO2 96%   Intake/Output Summary (Last 24 hours) at 05/13/2022 1008 Last data filed at 05/13/2022 1005 Gross per 24 hour  Intake 3219.39 ml  Output 75 ml  Net 3144.39 ml   Filed Weights   04/23/2022 1736 05/13/22 0004  Weight: 46.7 kg 62.6 kg  Telemetry: NSR PPV:6  Examination: General: chronically ill woman, laying on bed on ventilator with minimal response HENT: pupils are unequal and poorly reactive, MMM ETT in place Lungs: diminished lung sounds bilaterally, with mild crackles on RLL anteriorly Cardiovascular: RRR Abdomen: BS present, soft Extremities: No rashes, 1+ DP pulses, skin is cool to the touch, dry. No extremity edema Neuro: unresponsive with unequal pupils, no motor response to pain on 4 extremities while on fentanyl. GU: Foley in place, with output  Significant labs Wbc 17 Cr 1.75 (baseline ~1.3) Lactate 4.2 -> 3.4 Troponin 1141->1078->988 Procal 76 MRSA negative Strep Ag negative Legionella pending   Resolved Hospital Problem list   Atrial fibrillation with RVR 05/13/22  Assessment & Plan:  Acute hypoxic hypercarbic respiratory failure COVID pneumonia COPD exacerbation -continue on ventilator support, now improving -f/u tracheal aspirate cultures, ordered -Procal 76; continue Cefepime for 7 days -Continue azithromycin 5 days -Continue on dexamethasone (10 mg BID 5 days, then 10  mg for 5 days, 10 days total) -Triple inhaler therapy  Shock, likely septic AKI On antibiotics per above -current total fluids: 100 mL/HR -PPV 6 -Weaning vasopressin and continue levophed -follow lactic acid until closed -Continue monitoring renal function  Acute  encephalopathy Anisocoria CT head without intracranial abnormalities Unknown if patient was hypoxic for extended period Now off sedation Plan on EEG/MRI to evaluate hypoxic encephalopathy if no improvement  Elevated troponin Downtrend 1078->988->798 Likely in the setting of demand ischemia Will continue to monitor  Hypertrophic Cardiomyopathy Pulmonary HTN TTE EF 75% 04/2022, concerns for cardiac amyloidosis Residual R pleural effusion BNP 1528, down from 2938 3 weeks ago No volume overloaded on exam Continue to monitor  Caloric malnutrition Alb 2.7 Will start tube feeds  DM Last A1c 6.3 Sensitive SSI  Tube feed correction   Best Practice (right click and "Reselect all SmartList Selections" daily)   Diet/type: NPO DVT prophylaxis: prophylactic heparin  GI prophylaxis: H2B Lines: Arterial Line Foley:  Yes, and it is still needed Code Status:  DNR Last date of multidisciplinary goals of care discussion: Daughter will be in the unit later; will update then  Labs   CBC: Recent Labs  Lab 04/25/2022 1744 05/13/22 0224  WBC 31.0* 17.0*  NEUTROABS 28.9* 16.0*  HGB 13.4 12.1  HCT 42.7 36.5  MCV 88.2 84.3  PLT 178 138*    Basic Metabolic Panel: Recent Labs  Lab 05/10/22 1526 04/25/2022 1744 05/13/22 0224  NA 136 131* 127*  K 4.4 4.4 3.7  CL 90* 91* 91*  CO2 '25 30 22  '$ GLUCOSE 152* 130* 196*  BUN 36* 45* 42*  CREATININE 1.33* 1.63* 1.75*  CALCIUM 9.2 8.2* 8.1*  MG 1.7  --  1.5*   GFR: Estimated Creatinine Clearance: 27 mL/min (A) (by C-G formula based on SCr of 1.75 mg/dL (H)). Recent Labs  Lab 05/13/2022 1744 04/25/2022 1919 05/13/22 0222 05/13/22 0224 05/13/22 0533 05/13/22 0753  PROCALCITON  --   --   --  76.59  --   --   WBC 31.0*  --   --  17.0*  --   --   LATICACIDVEN 3.6* 3.7* 4.2*  --  3.4* 3.6*    Liver Function Tests: Recent Labs  Lab 05/11/2022 1744 05/13/22 0224  AST 87* 102*  ALT 51* 49*  ALKPHOS 78 65  BILITOT 3.2* 3.8*  PROT 6.7  4.8*  ALBUMIN 2.7* 1.9*   No results for input(s): "LIPASE", "AMYLASE" in the last 168 hours. Recent Labs  Lab 05/13/22 0533  AMMONIA 28    ABG    Component Value Date/Time   PHART 7.36 05/05/2022 2101   PCO2ART 53 (H) 04/23/2022 2101   PO2ART 82 (L) 05/04/2022 2101   HCO3 29.6 (H) 05/11/2022 2101   O2SAT 96.1 04/22/2022 2101     Coagulation Profile: No results for input(s): "INR", "PROTIME" in the last 168 hours.  Cardiac Enzymes: No results for input(s): "CKTOTAL", "CKMB", "CKMBINDEX", "TROPONINI" in the last 168 hours.  HbA1C: Hgb A1c MFr Bld  Date/Time Value Ref Range Status  04/22/2022 06:23 PM 6.3 (H) 4.8 - 5.6 % Final    Comment:    (NOTE) Pre diabetes:          5.7%-6.4%  Diabetes:              >6.4%  Glycemic control for   <7.0% adults with diabetes   03/28/2022 11:34 AM 6.8 (H) 4.8 - 5.6 % Final    Comment:  Prediabetes: 5.7 - 6.4          Diabetes: >6.4          Glycemic control for adults with diabetes: <7.0     CBG: Recent Labs  Lab 05/13/22 0005 05/13/22 0350 05/13/22 0729  GLUCAP 156* 179* 222*    Review of Systems:     Past Medical History:  She,  has a past medical history of Back pain, Carotid artery occlusion, Cough, Diastolic heart failure ([place]), Dry skin, Essential hypertension, History of bruising easily, History of colon polyps, History of migraine, History of pneumonia, Hyperlipidemia, PAD (peripheral artery disease) ([place]), Panic attack, and Rheumatoid arthritis(714.0).   Surgical History:   Past Surgical History:  Procedure Laterality Date   ABDOMINAL HYSTERECTOMY     ANGIOPLASTY  04/20/2012   Procedure: ANGIOPLASTY;  Surgeon: Serafina Mitchell, MD;  Location: [place];  Service: Vascular;  Laterality: Right;  Using 1cm x6 cm vascu-guard patch    [place]  01/31/2005   CAROTID ANGIOGRAM N/A 01/06/2012   Procedure: CAROTID ANGIOGRAM;  Surgeon: Lorretta Harp, MD;  Location: [facility] CATH  LAB;  Service: Cardiovascular;  Laterality: N/A;   CARPAL TUNNEL RELEASE     CHOLECYSTECTOMY     COLONOSCOPY     CORONARY ANGIOPLASTY  01/06/12/2005/2006    2 stents   EGD/TCS  03/2004   Dr. Gala Romney: Tiny distal esophageal erosions, prepyloric antral erosions/ulcerations, internal hemorrhoids, diminutive polyp in the rectosigmoid junction removed (hyperplastic).  Pathology not available but reportedly she had H. pylori which was treated.   ENDARTERECTOMY  04/20/2012   Procedure: ENDARTERECTOMY CAROTID;  Surgeon: Serafina Mitchell, MD;  Location: [place];  Service: Vascular;  Laterality: Right;   ESOPHAGOGASTRODUODENOSCOPY  07/2004   Dr. Gala Romney: Previous antral ulcer was completely healed.  EGD normal.   GIVENS CAPSULE STUDY     ???   leg stents     LOWER EXTREMITY ANGIOGRAM N/A 01/06/2012   Procedure: LOWER EXTREMITY ANGIOGRAM;  Surgeon: Lorretta Harp, MD;  Location[facility] CATH LAB;  Service: Cardiovascular;  Laterality: N/A;   TOTAL KNEE ARTHROPLASTY     TRIGGER FINGER RELEASE     ulnar nerve surgery       Social History:   reports that she has been smoking cigarettes. She started smoking about 63 years ago. She has a 25.00 pack-year smoking history. She has never used smokeless tobacco. She reports that she does not currently use alcohol. She reports that she does not use drugs.   Family History:  Her family history includes Cancer in her father; Diabetes in her brother and sister; Heart attack in her brother; Heart disease in her brother; Hyperlipidemia in her mother; Hypertension in her brother, mother, and sister. There is no history of Colon cancer.   Allergies Allergies  Allergen Reactions   Latex Other (See Comments)    Burns skin    Tape Other (See Comments) and Itching    Burns skin    Atorvastatin Other (See Comments)    myalgia myalgia     Home Medications  Prior to Admission medications   Medication Sig Start Date End Date Taking? Authorizing Provider  acetaminophen  (TYLENOL) 325 MG tablet Take 2 tablets (650 mg total) by mouth every 6 (six) hours as needed for mild pain or moderate pain (or Fever >/= 101). 05/05/22  Yes Johnson, Clanford L, MD  albuterol (VENTOLIN HFA) 108 (90 Base) MCG/ACT inhaler Inhale 2 puffs into the  lungs every 6 (six) hours as needed for wheezing or shortness of breath. INHALE 2 INHALATIONS INTO THE LUNGS EVERY 6 HOURS AS NEEDED FOR WHEEZING Strength: 108 (90 Base) MCG/ACT 11/17/21  Yes Paseda, Lillie Columbia R, FNP  Artificial Tears ophthalmic solution Place 2 drops into both eyes as needed. 04/05/22 04/05/23 Yes [name], FNP  aspirin EC 81 MG tablet Take 81 mg by mouth daily.   Yes [provider]  furosemide (LASIX) 40 MG tablet Take 1 tablet (40 mg total) by mouth daily. 05/06/22  Yes Johnson, Clanford L, MD  ipratropium-albuterol (DUONEB) 0.5-2.5 (3) MG/3ML SOLN Take 3 mLs by nebulization every 4 (four) hours as needed (wheezing, coughing, SOB). 05/05/22  Yes Johnson, Clanford L, MD  losartan (COZAAR) 25 MG tablet Take 1 tablet (25 mg total) by mouth daily. 05/05/22  Yes Johnson, Clanford L, MD  metoprolol succinate (TOPROL-XL) 25 MG 24 hr tablet Take 1 tablet (25 mg total) by mouth daily. 04/30/21  Yes Paseda, Dewaine Conger, FNP  potassium chloride (KLOR-CON M) 10 MEQ tablet Take 1 tablet (10 mEq total) by mouth daily. 05/05/22  Yes Johnson, Clanford L, MD  predniSONE (DELTASONE) 20 MG tablet Take 3 PO QAM x5days, 2 PO QAM x5days, 1 PO QAM x5days 05/06/22  Yes Johnson, Clanford L, MD  rosuvastatin (CRESTOR) 10 MG tablet TAKE 1 TABLET BY MOUTH ONCE A DAY. 06/15/21  Yes Imogene Burn, PA-C  SYMBICORT 80-4.5 MCG/ACT inhaler INHALE 2 PUFFS TWICE DAILY 11/15/21  Yes Paseda, Dewaine Conger, FNP     Critical care time: 35 min    Romana Juniper, MD [facility] Internal Medicine Program - PGY-1 05/13/2022, 10:08 AM

## 2022-05-13 NOTE — Procedures (Addendum)
Central Venous Catheter Insertion Procedure Note  KORY DIMARZIO  [id number]  September 04, 1945  Date:05/13/22  Time:2:21 AM   Provider Performing:Kaitlin Ardito W Heber [place]   Procedure: Insertion of Non-tunneled Central Venous [phone]) with US guidance [id number])   Indication(s) Medication administration  Consent Risks of the procedure as well as the alternatives and risks of each were explained to the patient and/or caregiver.  Consent for the procedure was obtained and is signed in the bedside chart  Anesthesia Topical only with 1% lidocaine   Timeout Verified patient identification, verified procedure, site/side was marked, verified correct patient position, special equipment/implants available, medications/allergies/relevant history reviewed, required imaging and test results available.  Sterile Technique Maximal sterile technique including full sterile barrier drape, hand hygiene, sterile gown, sterile gloves, mask, hair covering, sterile ultrasound probe cover (if used).  Procedure Description Area of catheter insertion was cleaned with chlorhexidine and draped in sterile fashion.  With real-time ultrasound guidance a central venous catheter was placed into the right internal jugular vein. Nonpulsatile blood flow and easy flushing noted in all ports.  The catheter was sutured in place and sterile dressing applied.  Complications/Tolerance None; patient tolerated the procedure well. Chest X-ray is ordered to verify placement for internal jugular or subclavian cannulation.   Chest x-ray is not ordered for femoral cannulation.  EBL Minimal  Specimen(s) None    [name], AGACNP-BC [place] [facility]  See Amion for personal pager PCCM on call pager [phone] until 7pm. Please call Elink 7p-7a. [phone]  05/13/2022 2:21 AM

## 2022-05-13 NOTE — Progress Notes (Addendum)
Initial Nutrition Assessment  DOCUMENTATION CODES:   Non-severe (moderate) malnutrition in context of chronic illness  INTERVENTION:  Once OGT advanced, recommend the following: Osmolite 1.5  at 50 ml/h (1200 ml per day) Start at 20 and advance by 60m q8h to goal Prosource TF20 60 ml 1x/d Provides 1880 kcal, 95 gm protein, 914 ml free water daily  NUTRITION DIAGNOSIS:  Moderate Malnutrition related to chronic illness (COPD) as evidenced by mild fat depletion, mild muscle depletion.  GOAL:   Patient will meet greater than or equal to 90% of their needs  MONITOR:   TF tolerance, I & O's, Vent status, Labs  REASON FOR ASSESSMENT:  Ventilator    ASSESSMENT:  Pt with hx of PAD, RA, HLD, HTN, CHF, and COPD presented to ED with SOB. Found to have septic shock related to HCAP. Intubated in ED  Patient is currently intubated on ventilator support. Found to be positive for COVID19. No family in room at the time of assessment to provide a nutrition hx. Muscle and fat depletions noted on exam.    Discussed in rounds, will initiate EN via OG tube today. Reviewed imaging and sideport noted to be at the GE junction. RN unsure if nightshift advanced per radiologist's recommendation but will advance and obtain a new KUB. Pt requiring pressor support x 2. Stable rates overall today. Will initiate TF slowly to allow for hemodynamic stability and monitor for signs of refeeding.  MV: 10 L/min Temp (24hrs), Avg:99 F (37.2 C), Min:97.5 F (36.4 C), Max:106 F (41.1 C)   Intake/Output Summary (Last 24 hours) at 05/13/2022 1303 Last data filed at 05/13/2022 1300 Gross per 24 hour  Intake 4081.47 ml  Output 124 ml  Net 3957.47 ml  Net IO Since Admission: 3,957.47 mL [05/13/22 1303]  Nutritionally Relevant Medications: Scheduled Meds:  dexamethasone (DECADRON) injection  10 mg Intravenous Q12H   famotidine  20 mg Oral Daily   mouth rinse  15 mL Mouth Rinse Q2H   Continuous Infusions:   sodium chloride 100 mL/hr at 05/13/22 0600   ceFEPime (MAXIPIME) IV     lactated ringers 150 mL/hr at 05/13/22 0600   norepinephrine (LEVOPHED) Adult infusion 9 mcg/min (05/13/22 0600)   vasopressin 0.03 Units/min (05/13/22 0600)   Labs Reviewed: Na 127, chloride 91  BUN 42, creatinine 1.75 Mg 1.5 CBG ranges from 156-222 mg/dL over the last 24 hours  NUTRITION - FOCUSED PHYSICAL EXAM: Flowsheet Row Most Recent Value  Orbital Region Moderate depletion  Upper Arm Region Mild depletion  Thoracic and Lumbar Region Mild depletion  Buccal Region Moderate depletion  Temple Region Moderate depletion  Clavicle Bone Region Mild depletion  Clavicle and Acromion Bone Region Moderate depletion  Scapular Bone Region Moderate depletion  Dorsal Hand Unable to assess  Patellar Region No depletion  Anterior Thigh Region No depletion  Posterior Calf Region No depletion  [pitting edema to the knee]  Edema (RD Assessment) Moderate  [BLE]  Hair Reviewed  Eyes Reviewed  Mouth Reviewed  Skin Reviewed  Nails Reviewed    Diet Order:   Diet Order             Diet NPO time specified  Diet effective now                   EDUCATION NEEDS:   Not appropriate for education at this time  Skin:  Skin Assessment: Reviewed RN Assessment  Last BM:  unsure  Height:   Ht Readings from Last 1  Encounters:  05/13/22 '5\' 7"'$  (1.702 m)    Weight:   Wt Readings from Last 1 Encounters:  05/13/22 62.6 kg    Ideal Body Weight:  61.4 kg  BMI:  Body mass index is 21.62 kg/m.  Estimated Nutritional Needs:   Kcal:  1700-2000 kcal/d  Protein:  95-110g/d  Fluid:  1.8-2L/d    Ranell Patrick, RD, LDN Clinical Dietitian RD pager # available in [facility]  After hours/weekend pager # available in [facility]

## 2022-05-13 NOTE — Telephone Encounter (Signed)
Per patient's chart, she is still admitted to the hospital.

## 2022-05-13 NOTE — H&P (Addendum)
NAME:  Leslie [name], MRN:  [id number], DOB:  [birth year], LOS: 1 ADMISSION DATE:  04/23/2022, CONSULTATION DATE:  05/13/22 REFERRING MD:  EDP, CHIEF COMPLAINT:  acute hypoxic/hypercarbic resp failure    History of Present Illness:  77 yo female, with pmh copd, hypertrophic CM and frequent hospitalizations. Presented after recent discharge on 05/05/22 for reportedly acutely decompensated heart failure. Per chart review pt originally called ems for sob and was given breathing nebulizers and improved. However subsequently pt decompensated and called EMS again which prompted transfer to ED.    Pt was initially noted to be in resp distress req NIV support but became poorly responsive on such and required intubation.  Aftter  pt intubated she was noted to be hypotensive and febrile to 105F. Septic work up ensued at osh and she was transferred to [facility] for further eval in light of her trop of reportedly > 1000. Pt presented on >20 mcg levo via piv, intubated on100% and 8 peep. She was unknowingly p ositive with COVID (prior to transfer).    History and ROS is unobtainable 2/2 pt's intubated and sedated condition. Pertinent  Medical History  Copd Htn CHF DM2   Significant Hospital Events: Including procedures, antibiotic start and stop dates in addition to other pertinent events   -Transfer from OSH  Interim History / Subjective:    Objective   Blood pressure 92/76, pulse 88, temperature 98.2 F (36.8 C), temperature source Oral, resp. rate (!) 22, height '5\' 7"'$  (1.702 m), weight 62.6 kg, SpO2 91 %.    Vent Mode: PRVC FiO2 (%):  [100 %] 100 % Set Rate:  [22 bmp] 22 bmp Vt Set:  [490 mL] 490 mL PEEP:  [8 cmH20] 8 cmH20 Plateau Pressure:  [26 cmH20-29 cmH20] 29 cmH20  No intake or output data in the 24 hours ending 05/13/22 0014 Filed Weights   05/03/2022 1736 05/13/22 0004  Weight: 46.7 kg 62.6 kg    Examination: General: chronically ill, laying supine, unresponsive with ventin place HENT:  ncat, pupils are unequal, and poorly reactive, mmmp Lungs: diminished bilaterally Cardiovascular: tachy regular Abdomen: soft nt,nd,bs+ Extremities: no c/c/e Neuro: unresponsive with unequal pupils, no respone in other 4 extremities  GU: defferred  Resolved Hospital Problem list     Assessment & Plan:  Acute hypoxic/hypercarbic resp failure:  Covid pna aecopd -titrate vent -ct chest -perhaps proning would be beneficial -duo nebs -empiric abx -steroids for covid and copd  Elevated troponin:  -1,141 consult cards -no acute st elevation -edp at osh consulted cards who recommended conservative management at that time.  -echo from 2 weeks ago reveals severe pulm htn and normal LVF -official mch cards consult in am.  -trend trop Sever pulm htm:  -consider outpt therapy  -supportive care   Afib rvr:  -amio infusion -monitor, rate controlled at this time.   Shock suspect septic 2/2 above:  -titrate vasopressors  -empiric abx -f/u cx  Anisocoria:  Acute toxic encephalopathy:   -c th pending -check ammonia   Best Practice (right click and "Reselect all SmartList Selections" daily)   Diet/type: NPO DVT prophylaxis: SCD GI prophylaxis: H2B Lines: N/A Foley:  Yes, and it is still needed Code Status:  DNR Last date of multidisciplinary goals of care discussion [pending family interaction]  Labs   CBC: Recent Labs  Lab 04/25/2022 1744  WBC 31.0*  NEUTROABS 28.9*  HGB 13.4  HCT 42.7  MCV 88.2  PLT 0000000    Basic Metabolic Panel: Recent Labs  Lab 05/10/22 1526 04/28/2022 1744  NA 136 131*  K 4.4 4.4  CL 90* 91*  CO2 25 30  GLUCOSE 152* 130*  BUN 36* 45*  CREATININE 1.33* 1.63*  CALCIUM 9.2 8.2*  MG 1.7  --    GFR: Estimated Creatinine Clearance: 29 mL/min (A) (by C-G formula based on SCr of 1.63 mg/dL (H)). Recent Labs  Lab 05/17/2022 1744 05/18/2022 1919  WBC 31.0*  --   LATICACIDVEN 3.6* 3.7*    Liver Function Tests: Recent Labs  Lab  05/08/2022 1744  AST 87*  ALT 51*  ALKPHOS 78  BILITOT 3.2*  PROT 6.7  ALBUMIN 2.7*   No results for input(s): "LIPASE", "AMYLASE" in the last 168 hours. No results for input(s): "AMMONIA" in the last 168 hours.  ABG    Component Value Date/Time   PHART 7.36 05/13/2022 2101   PCO2ART 53 (H) 04/24/2022 2101   PO2ART 82 (L) 04/30/2022 2101   HCO3 29.6 (H) 05/11/2022 2101   O2SAT 96.1 05/14/2022 2101     Coagulation Profile: No results for input(s): "INR", "PROTIME" in the last 168 hours.  Cardiac Enzymes: No results for input(s): "CKTOTAL", "CKMB", "CKMBINDEX", "TROPONINI" in the last 168 hours.  HbA1C: Hgb A1c MFr Bld  Date/Time Value Ref Range Status  04/22/2022 06:23 PM 6.3 (H) 4.8 - 5.6 % Final    Comment:    (NOTE) Pre diabetes:          5.7%-6.4%  Diabetes:              >6.4%  Glycemic control for   <7.0% adults with diabetes   03/28/2022 11:34 AM 6.8 (H) 4.8 - 5.6 % Final    Comment:             Prediabetes: 5.7 - 6.4          Diabetes: >6.4          Glycemic control for adults with diabetes: <7.0     CBG: Recent Labs  Lab 05/13/22 0005  GLUCAP 156*    Review of Systems:   Unobtainable 2/2 mental status  Past Medical History:  She,  has a past medical history of Back pain, Carotid artery occlusion, Cough, Diastolic heart failure ([place]), Dry skin, Essential hypertension, History of bruising easily, History of colon polyps, History of migraine, History of pneumonia, Hyperlipidemia, PAD (peripheral artery disease) ([place]), Panic attack, and Rheumatoid arthritis(714.0).   Surgical History:   Past Surgical History:  Procedure Laterality Date   ABDOMINAL HYSTERECTOMY     ANGIOPLASTY  04/20/2012   Procedure: ANGIOPLASTY;  Surgeon: Serafina Mitchell, MD;  Location: [place];  Service: Vascular;  Laterality: Right;  Using 1cm x6 cm vascu-guard patch    [place]  01/31/2005   CAROTID ANGIOGRAM N/A 01/06/2012   Procedure: CAROTID  ANGIOGRAM;  Surgeon: Lorretta Harp, MD;  Location: [facility] CATH LAB;  Service: Cardiovascular;  Laterality: N/A;   CARPAL TUNNEL RELEASE     CHOLECYSTECTOMY     COLONOSCOPY     CORONARY ANGIOPLASTY  01/06/12/2005/2006    2 stents   EGD/TCS  03/2004   Dr. Gala Romney: Tiny distal esophageal erosions, prepyloric antral erosions/ulcerations, internal hemorrhoids, diminutive polyp in the rectosigmoid junction removed (hyperplastic).  Pathology not available but reportedly she had H. pylori which was treated.   ENDARTERECTOMY  04/20/2012   Procedure: ENDARTERECTOMY CAROTID;  Surgeon: Serafina Mitchell, MD;  Location: [place];  Service: Vascular;  Laterality: Right;  ESOPHAGOGASTRODUODENOSCOPY  07/2004   Dr. Gala Romney: Previous antral ulcer was completely healed.  EGD normal.   GIVENS CAPSULE STUDY     ???   leg stents     LOWER EXTREMITY ANGIOGRAM N/A 01/06/2012   Procedure: LOWER EXTREMITY ANGIOGRAM;  Surgeon: Lorretta Harp, MD;  Location: [facility] CATH LAB;  Service: Cardiovascular;  Laterality: N/A;   TOTAL KNEE ARTHROPLASTY     TRIGGER FINGER RELEASE     ulnar nerve surgery       Social History:   reports that she has been smoking cigarettes. She started smoking about 63 years ago. She has a 25.00 pack-year smoking history. She has never used smokeless tobacco. She reports that she does not currently use alcohol. She reports that she does not use drugs.   Family History:  Her family history includes Cancer in her father; Diabetes in her brother and sister; Heart attack in her brother; Heart disease in her brother; Hyperlipidemia in her mother; Hypertension in her brother, mother, and sister. There is no history of Colon cancer.   Allergies Allergies  Allergen Reactions   Latex Other (See Comments)    Burns skin    Tape Other (See Comments) and Itching    Burns skin    Atorvastatin Other (See Comments)    myalgia myalgia     Home Medications  Prior to Admission medications   Medication Sig Start  Date End Date Taking? Authorizing Provider  acetaminophen (TYLENOL) 325 MG tablet Take 2 tablets (650 mg total) by mouth every 6 (six) hours as needed for mild pain or moderate pain (or Fever >/= 101). 05/05/22  Yes Johnson, Clanford L, MD  albuterol (VENTOLIN HFA) 108 (90 Base) MCG/ACT inhaler Inhale 2 puffs into the lungs every 6 (six) hours as needed for wheezing or shortness of breath. INHALE 2 INHALATIONS INTO THE LUNGS EVERY 6 HOURS AS NEEDED FOR WHEEZING Strength: 108 (90 Base) MCG/ACT 11/17/21  Yes Paseda, Lillie Columbia R, FNP  Artificial Tears ophthalmic solution Place 2 drops into both eyes as needed. 04/05/22 04/05/23 Yes [name], FNP  aspirin EC 81 MG tablet Take 81 mg by mouth daily.   Yes [provider]  furosemide (LASIX) 40 MG tablet Take 1 tablet (40 mg total) by mouth daily. 05/06/22  Yes Johnson, Clanford L, MD  ipratropium-albuterol (DUONEB) 0.5-2.5 (3) MG/3ML SOLN Take 3 mLs by nebulization every 4 (four) hours as needed (wheezing, coughing, SOB). 05/05/22  Yes Johnson, Clanford L, MD  losartan (COZAAR) 25 MG tablet Take 1 tablet (25 mg total) by mouth daily. 05/05/22  Yes Johnson, Clanford L, MD  metoprolol succinate (TOPROL-XL) 25 MG 24 hr tablet Take 1 tablet (25 mg total) by mouth daily. 04/30/21  Yes Paseda, Dewaine Conger, FNP  potassium chloride (KLOR-CON M) 10 MEQ tablet Take 1 tablet (10 mEq total) by mouth daily. 05/05/22  Yes Johnson, Clanford L, MD  predniSONE (DELTASONE) 20 MG tablet Take 3 PO QAM x5days, 2 PO QAM x5days, 1 PO QAM x5days 05/06/22  Yes Johnson, Clanford L, MD  rosuvastatin (CRESTOR) 10 MG tablet TAKE 1 TABLET BY MOUTH ONCE A DAY. 06/15/21  Yes Imogene Burn, PA-C  SYMBICORT 80-4.5 MCG/ACT inhaler INHALE 2 PUFFS TWICE DAILY 11/15/21  Yes Paseda, Dewaine Conger, FNP     Critical care time: 95

## 2022-05-14 DIAGNOSIS — F17210 Nicotine dependence, cigarettes, uncomplicated: Secondary | ICD-10-CM

## 2022-05-14 DIAGNOSIS — J9601 Acute respiratory failure with hypoxia: Secondary | ICD-10-CM

## 2022-05-14 LAB — COOXEMETRY PANEL
Carboxyhemoglobin: 2.5 % — ABNORMAL HIGH (ref 0.5–1.5)
Methemoglobin: 0.9 % (ref 0.0–1.5)
O2 Saturation: 78.1 %
Total hemoglobin: 12 g/dL (ref 12.0–16.0)

## 2022-05-14 LAB — CBC
HCT: 40.5 % (ref 36.0–46.0)
Hemoglobin: 13 g/dL (ref 12.0–15.0)
MCH: 27 pg (ref 26.0–34.0)
MCHC: 32.1 g/dL (ref 30.0–36.0)
MCV: 84 fL (ref 80.0–100.0)
Platelets: 125 10*3/uL — ABNORMAL LOW (ref 150–400)
RBC: 4.82 MIL/uL (ref 3.87–5.11)
RDW: 17.9 % — ABNORMAL HIGH (ref 11.5–15.5)
WBC: 27.9 10*3/uL — ABNORMAL HIGH (ref 4.0–10.5)
nRBC: 0 % (ref 0.0–0.2)

## 2022-05-14 LAB — COMPREHENSIVE METABOLIC PANEL
ALT: 81 U/L — ABNORMAL HIGH (ref 0–44)
AST: 134 U/L — ABNORMAL HIGH (ref 15–41)
Albumin: 2.1 g/dL — ABNORMAL LOW (ref 3.5–5.0)
Alkaline Phosphatase: 103 U/L (ref 38–126)
Anion gap: 18 — ABNORMAL HIGH (ref 5–15)
BUN: 52 mg/dL — ABNORMAL HIGH (ref 8–23)
CO2: 22 mmol/L (ref 22–32)
Calcium: 8.3 mg/dL — ABNORMAL LOW (ref 8.9–10.3)
Chloride: 92 mmol/L — ABNORMAL LOW (ref 98–111)
Creatinine, Ser: 2.17 mg/dL — ABNORMAL HIGH (ref 0.44–1.00)
GFR, Estimated: 23 mL/min — ABNORMAL LOW (ref >60)
Glucose, Bld: 99 mg/dL (ref 70–99)
Potassium: 4 mmol/L (ref 3.5–5.1)
Sodium: 132 mmol/L — ABNORMAL LOW (ref 135–145)
Total Bilirubin: 3.6 mg/dL — ABNORMAL HIGH (ref 0.3–1.2)
Total Protein: 5.5 g/dL — ABNORMAL LOW (ref 6.5–8.1)

## 2022-05-14 LAB — POCT I-STAT 7, (LYTES, BLD GAS, ICA,H+H)
Acid-Base Excess: 0 mmol/L (ref 0.0–2.0)
Bicarbonate: 26.4 mmol/L (ref 20.0–28.0)
Calcium, Ion: 1.15 mmol/L (ref 1.15–1.40)
HCT: 36 % (ref 36.0–46.0)
Hemoglobin: 12.2 g/dL (ref 12.0–15.0)
O2 Saturation: 93 %
Patient temperature: 99.3
Potassium: 4 mmol/L (ref 3.5–5.1)
Sodium: 128 mmol/L — ABNORMAL LOW (ref 135–145)
TCO2: 28 mmol/L (ref 22–32)
pCO2 arterial: 49.4 mmHg — ABNORMAL HIGH (ref 32–48)
pH, Arterial: 7.337 — ABNORMAL LOW (ref 7.35–7.45)
pO2, Arterial: 73 mmHg — ABNORMAL LOW (ref 83–108)

## 2022-05-14 LAB — GLUCOSE, CAPILLARY
Glucose-Capillary: 125 mg/dL — ABNORMAL HIGH (ref 70–99)
Glucose-Capillary: 147 mg/dL — ABNORMAL HIGH (ref 70–99)
Glucose-Capillary: 199 mg/dL — ABNORMAL HIGH (ref 70–99)
Glucose-Capillary: 205 mg/dL — ABNORMAL HIGH (ref 70–99)
Glucose-Capillary: 268 mg/dL — ABNORMAL HIGH (ref 70–99)
Glucose-Capillary: 301 mg/dL — ABNORMAL HIGH (ref 70–99)

## 2022-05-14 LAB — PHOSPHORUS
Phosphorus: 4.9 mg/dL — ABNORMAL HIGH (ref 2.5–4.6)
Phosphorus: 4.6 mg/dL (ref 2.5–4.6)

## 2022-05-14 LAB — MAGNESIUM
Magnesium: 2.8 mg/dL — ABNORMAL HIGH (ref 1.7–2.4)
Magnesium: 2.5 mg/dL — ABNORMAL HIGH (ref 1.7–2.4)

## 2022-05-14 LAB — URINE CULTURE: Culture: NO GROWTH

## 2022-05-14 MED ORDER — INSULIN ASPART 100 UNIT/ML IJ SOLN
0.0000 [IU] | INTRAMUSCULAR | Status: DC
  Administered 2022-05-14: 8 [IU] via SUBCUTANEOUS
  Administered 2022-05-14 – 2022-05-15 (×4): 3 [IU] via SUBCUTANEOUS
  Administered 2022-05-15 – 2022-05-16 (×2): 5 [IU] via SUBCUTANEOUS
  Administered 2022-05-16: 3 [IU] via SUBCUTANEOUS

## 2022-05-14 MED ORDER — WHITE PETROLATUM EX OINT
TOPICAL_OINTMENT | CUTANEOUS | Status: DC | PRN
  Filled 2022-05-14: qty 28.35

## 2022-05-14 NOTE — Progress Notes (Addendum)
NAME:  Leslie [name], MRN:  [id number], DOB:  01-03-1946, LOS: 2 ADMISSION DATE:  05/11/2022, CONSULTATION DATE:  05/13/22 REFERRING MD:  EDP, CHIEF COMPLAINT:  Acute hypoxic hypercarbic respiratory failure   History of Present Illness:  77 yo female, with pmh copd, hypertrophic CM and frequent hospitalizations. Presented after recent discharge on 05/05/22 for reportedly acutely decompensated heart failure. Per chart review pt originally called ems for sob and was given breathing nebulizers and improved. However subsequently pt decompensated and called EMS again which prompted transfer to ED.     Pt was initially noted to be in resp distress req NIV support but became poorly responsive on such and required intubation.  Aftter  pt intubated she was noted to be hypotensive and febrile to 105F. Septic work up ensued at osh and she was transferred to [facility] for further eval in light of her trop of reportedly > 1000. Pt presented on >20 mcg levo via piv, intubated on100% and 8 peep. She was unknowingly p ositive with COVID (prior to transfer).     History and ROS is unobtainable 2/2 pt's intubated and sedated condition.  Pertinent  Medical History  Active smoker COPD Hypertrophic cardiomyopathy Pulmonary hypertension HTN DM Recent d/c 05/05/22 for decompensated HF with resolution of R pleural effusion   Significant Hospital Events: Including procedures, antibiotic start and stop dates in addition to other pertinent events   2/22 transferred from OSH 2/23 admitted to ICU On fentanyl, versed for sedation On vasopressin and levophed CT head without acute abnormality CT chest with pulmonary edema, bilateral opacities, and small R pleural effusion Blood cultures positive for MSSA  Interim History / Subjective:  More awake today.   Objective   Blood pressure (!) 144/83, pulse 88, temperature 99.5 F (37.5 C), resp. rate (!) 27, height '5\' 7"'$  (1.702 m), weight 71.1 kg, SpO2 91 %. CVP:  [16 mmHg] 16  mmHg  Vent Mode: PRVC FiO2 (%):  [55 %-100 %] 55 % Set Rate:  [22 bmp] 22 bmp Vt Set:  [490 mL] 490 mL PEEP:  [8 cmH20] 8 cmH20 Plateau Pressure:  [26 cmH20-29 cmH20] 27 cmH20  SPO2 96%   Intake/Output Summary (Last 24 hours) at 05/14/2022 1058 Last data filed at 05/14/2022 0800 Gross per 24 hour  Intake 1919.39 ml  Output 346 ml  Net 1573.39 ml    Filed Weights   05/13/22 0004 05/14/22 0734  Weight: 62.6 kg 71.1 kg  Telemetry: NSR PPV:6  Examination: General: chronically ill woman, laying on bed on ventilator with minimal response HENT: pupils are unequal and poorly reactive, MMM ETT in place Lungs: diminished lung sounds bilaterally, with mild crackles on RLL anteriorly. Did not tolerate weaning FiO2 below 0.4  Cardiovascular: RRR 3/6 SEM. CVP 7  Abdomen: BS present, soft Extremities: No rashes, 1+ DP pulses, skin is cool to the touch, dry. No extremity edema Neuro: opens eyes to voice.  GU: Foley in place, with output  Ancillary tests personally reviewed:  ScvO2 78%, SaO2 99% - CI 3.2 (Fick),  SVR 949,  Assessment & Plan:  Acute hypoxic hypercarbic respiratory failure due to COVID/MSSA pneumonia COPD exacerbation Septic shock due to MSSA bacteria Atrial fibrillation AKI Acute encephalopathy [place] related ischemia Hypertrophic Cardiomyopathy Pulmonary HTN Caloric malnutrition DM  Plan: - Continue to wean FiO2 to 0.4 prior to weaning PEEP.  - May be ready for SBT soon - Keep off continuous sedation, allow her to wake up and will also help BP. - Hemodynamics adequately  corrected. At this point, looks more like septic without significant cardiogenic component.  - Will adequate perfusion per hemodynamics, renal function should improve.  - Taper antibiotics to cefazolin, repeat blood cultures to ensure blood sterilization.  - Continue current bronchodilators  Best Practice (right click and "Reselect all SmartList Selections" daily)   Diet/type: NPO tube  feeds DVT prophylaxis: prophylactic heparin  GI prophylaxis: H2B Lines: Arterial Line Foley:  Yes, and it is still needed Code Status:  DNR Last date of multidisciplinary goals of care discussion: Nephew updated at bedside today.   CRITICAL CARE Performed by: Kipp Brood   Total critical care time: 35 minutes  Critical care time was exclusive of separately billable procedures and treating other patients.  Critical care was necessary to treat or prevent imminent or life-threatening deterioration.  Critical care was time spent personally by me on the following activities: development of treatment plan with patient and/or surrogate as well as nursing, discussions with consultants, evaluation of patient's response to treatment, examination of patient, obtaining history from patient or surrogate, ordering and performing treatments and interventions, ordering and review of laboratory studies, ordering and review of radiographic studies, pulse oximetry, re-evaluation of patient's condition and participation in multidisciplinary rounds.  Kipp Brood, MD [facility] ICU Physician [place]  Pager: [phone] Mobile: [phone] After hours: [phone].   05/14/2022, 10:58 AM

## 2022-05-14 NOTE — Consult Note (Signed)
[facility] for Infectious Disease    Date of Admission:  05/06/2022   Total days of inpatient antibiotics 2        Reason for Consult: MSSA bacteremia    Principal Problem:   Respiratory failure with hypoxia ([place]) Active Problems:   Acute hypoxemic respiratory failure due to COVID-19 (HCC)   Shock (HCC)   Malnutrition of moderate degree   Assessment: 77 year old female with history of hypertrophic cardiomyopathy, diabetes mellitus, COPD admitted with acute  respiratory failure found to be COVID-positive.  #MSSA bacteremia with concern for superimposed bacterial pneumonia #COVID-19 infection #Intubated - Recent admission and discharged on 2/15 for acute, decompensated heart failure. Per EMR review there was right post forearm PIV, no phlebitis noted on my exam.  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  Noted patchy consolidative airspace opacities more prominent within the left lower lobe. - Pro-Cal 76.5K Recommendations:  -Repeat blood Cx to ensure clearance -Continue cefazolin -TTE, given abnormalities seen on prebious TTE and MSSA bacteremia will likely need TEE -Would engage cards given abnormal TTE on 04/23/22 - Legionella urine/strep Ag - Sputum cultures Microbiology:   Antibiotics: Vancomycin and cefepime 2/22 2/22-present cefazolin azithromycin     HPI: Leslie [name] is a 77 y.o. female past medical history of COPD, diabetes mellitus type 2 hypertrophic cardiomyopathy, frequent hospitalization recently discharged 2/15 for acutely decompensated heart failure presented with shortness of breath.  This.  Found to have a temp of 105, hypotensive on arrival elevated troponin greater than 1 K, WBC 31 K.  Required levo, IV  fluids and intubated.  Found to be COVID-positive. CT chest showed pulmonary edema with superimposed infection consistent with COVID-19.  Initially started on vancomycin and cefepime then transition to cefazolin once blood cultures returned positive for MSSA.  Review of Systems: Review of Systems  Unable to perform ROS: Intubated    Past Medical History:  Diagnosis Date   Back pain    DDD   Carotid artery occlusion    Cough    Diastolic heart failure (HCC)    Dry skin    Essential hypertension    History of bruising easily    History of colon polyps    History of migraine    History of pneumonia    Hyperlipidemia    PAD (peripheral artery disease) (HCC)    Panic attack    Rheumatoid arthritis(714.0)     Social History   Tobacco Use   Smoking status: Every Day    Packs/day: 0.50    Years: 50.00    Total pack years: 25.00    Types: Cigarettes    Start date: 1961   Smokeless tobacco: Never   Tobacco comments:    Smoke 1/2 pack per day 09/04/20. Not willing to quit now.  Vaping Use   Vaping Use: Never used  Substance Use Topics   Alcohol use: Not Currently   Drug use: No    Family History  Problem Relation Age of Onset   Hyperlipidemia Mother    Hypertension Mother    Cancer Father        Brain   Diabetes Sister    Hypertension Sister    Diabetes Brother    Heart disease Brother    Hypertension Brother  Heart attack Brother    Colon cancer Neg Hx    Scheduled Meds:  arformoterol  15 mcg Nebulization BID   budesonide (PULMICORT) nebulizer solution  0.25 mg Nebulization BID   Chlorhexidine Gluconate Cloth  6 each Topical Q0600   dexamethasone (DECADRON) injection  10 mg Intravenous Q12H   docusate  100 mg Per Tube BID   famotidine  10 mg Oral Daily   feeding supplement (PROSource TF20)  60 mL Per Tube Daily   heparin injection (subcutaneous)  5,000 Units Subcutaneous Q8H   insulin aspart  0-9 Units Subcutaneous Q4H   insulin aspart  2 Units  Subcutaneous Q4H   mouth rinse  15 mL Mouth Rinse Q2H   polyethylene glycol  17 g Per Tube Daily   revefenacin  175 mcg Nebulization Daily   Continuous Infusions:  sodium chloride 10 mL/hr at 05/14/22 0300   amiodarone 30 mg/hr (05/14/22 1114)    ceFAZolin (ANCEF) IV 2 g (05/14/22 1101)   DOBUTamine 2.5 mcg/kg/min (05/14/22 0600)   feeding supplement (OSMOLITE 1.5 CAL) 40 mL/hr at 05/14/22 0755   norepinephrine (LEVOPHED) Adult infusion 10.667 mcg/min (05/14/22 0600)   vasopressin 0.03 Units/min (05/14/22 1215)   PRN Meds:.sodium chloride, fentaNYL (SUBLIMAZE) injection, fentaNYL (SUBLIMAZE) injection, ipratropium-albuterol, mouth rinse, white petrolatum Allergies  Allergen Reactions   Latex Other (See Comments)    Burns skin    Tape Other (See Comments) and Itching    Burns skin    Atorvastatin Other (See Comments)    myalgia myalgia    OBJECTIVE: Blood pressure (!) 144/83, pulse 87, temperature 99.3 F (37.4 C), resp. rate (!) 24, height '5\' 7"'$  (1.702 m), weight 71.1 kg, SpO2 98 %.  Physical Exam Constitutional:      Appearance: Normal appearance.     Comments: Intubated  HENT:     Head: Normocephalic and atraumatic.     Right Ear: Tympanic membrane normal.     Left Ear: Tympanic membrane normal.     Nose: Nose normal.     Mouth/Throat:     Mouth: Mucous membranes are moist.  Eyes:     Extraocular Movements: Extraocular movements intact.     Conjunctiva/sclera: Conjunctivae normal.     Pupils: Pupils are equal, round, and reactive to light.  Cardiovascular:     Rate and Rhythm: Normal rate and regular rhythm.     Heart sounds: No murmur heard.    No friction rub. No gallop.  Pulmonary:     Effort: Pulmonary effort is normal.     Breath sounds: Normal breath sounds.  Abdominal:     General: Abdomen is flat.     Palpations: Abdomen is soft.  Skin:    General: Skin is warm and dry.  Psychiatric:        Mood and Affect: Mood normal.     Lab Results Lab  Results  Component Value Date   WBC 27.9 (H) 05/14/2022   HGB 12.2 05/14/2022   HCT 36.0 05/14/2022   MCV 84.0 05/14/2022   PLT 125 (L) 05/14/2022    Lab Results  Component Value Date   CREATININE 2.17 (H) 05/14/2022   BUN 52 (H) 05/14/2022   NA 128 (L) 05/14/2022   K 4.0 05/14/2022   CL 92 (L) 05/14/2022   CO2 22 05/14/2022    Lab Results  Component Value Date   ALT 81 (H) 05/14/2022   AST 134 (H) 05/14/2022   ALKPHOS 103 05/14/2022   BILITOT 3.6 (H) 05/14/2022  Laurice Record, MD [facility] for Infectious Disease [facility] 05/14/2022, 1:43 PM

## 2022-05-14 NOTE — Progress Notes (Signed)
[facility] Progress Note Patient Name: Leslie [name] DOB: Mar 20, 1946 MRN: [id number]   Date of Service  05/14/2022  HPI/Events of Note  RN reporting irreg rhythm & gradually increasing ST segment.  I asked her to obtain an EKG, not in Epic yet.  Appears to be SR w/PACs & PVCs but also looks like she could be going in & out of afib    eICU Interventions  EKG reviewed, incomplete right bundle branch block.  No acute intervention indicated.  Rate controlled under 110.  BPs remain greater than 65.     Intervention Category Intermediate Interventions: Arrhythmia - evaluation and management  Deanndra Kirley 05/14/2022, 5:30 AM

## 2022-05-14 NOTE — Progress Notes (Signed)
[facility] Progress Note Patient Name: Leslie [name] DOB: 1945-09-20 MRN: [id number]   Date of Service  05/14/2022  HPI/Events of Note  Pt CBGs 260-300 on sensitive SSI. Per report she was supposed to be changed to resistant scale. TF running @ 50/hr.  eICU Interventions  Changed to moderate SSI coverage, goal < 180.      Intervention Category Intermediate Interventions: Best-practice therapies (e.g. DVT, beta blocker, etc.);Hyperglycemia - evaluation and treatment  Elmer Sow 05/14/2022, 8:04 PM  2:02 Camera: Discussed with RN. Fio2 and PEEP up to 70%, 10. Sats 91%. RR is ok. Decreasing UOP in this shift.    AHRF on Vent Covid/MSSA PNA, COPD PAH Sepsis, on vaso, levo low dose and dobutamine at 2.3  - get CxR, labs -already drawn.  No fever or in pain.   3:12 CxR  worsening effusion right > left, pneumonia. ABG: worsening P/F ratio. RT increased fio2 to 80%.ARDS.  ID notes reviewed.  - get Cardiology and Renal consultation in AM. DNR status. Thought of giving lasix, but has severe PAH and on dobutamine.  - follow ARDS net protocol.

## 2022-05-15 ENCOUNTER — Inpatient Hospital Stay (HOSPITAL_COMMUNITY): Payer: Medicare HMO

## 2022-05-15 DIAGNOSIS — I38 Endocarditis, valve unspecified: Secondary | ICD-10-CM

## 2022-05-15 LAB — POCT I-STAT 7, (LYTES, BLD GAS, ICA,H+H)
Acid-base deficit: 1 mmol/L (ref 0.0–2.0)
Acid-base deficit: 2 mmol/L (ref 0.0–2.0)
Acid-base deficit: 2 mmol/L (ref 0.0–2.0)
Acid-base deficit: 1 mmol/L (ref 0.0–2.0)
Bicarbonate: 23.8 mmol/L (ref 20.0–28.0)
Bicarbonate: 27.5 mmol/L (ref 20.0–28.0)
Bicarbonate: 29 mmol/L — ABNORMAL HIGH (ref 20.0–28.0)
Bicarbonate: 24.7 mmol/L (ref 20.0–28.0)
Calcium, Ion: 1.14 mmol/L — ABNORMAL LOW (ref 1.15–1.40)
Calcium, Ion: 1.21 mmol/L (ref 1.15–1.40)
Calcium, Ion: 1.23 mmol/L (ref 1.15–1.40)
Calcium, Ion: 1.19 mmol/L (ref 1.15–1.40)
HCT: 34 % — ABNORMAL LOW (ref 36.0–46.0)
HCT: 34 % — ABNORMAL LOW (ref 36.0–46.0)
HCT: 36 % (ref 36.0–46.0)
HCT: 34 % — ABNORMAL LOW (ref 36.0–46.0)
Hemoglobin: 11.6 g/dL — ABNORMAL LOW (ref 12.0–15.0)
Hemoglobin: 11.6 g/dL — ABNORMAL LOW (ref 12.0–15.0)
Hemoglobin: 12.2 g/dL (ref 12.0–15.0)
Hemoglobin: 11.6 g/dL — ABNORMAL LOW (ref 12.0–15.0)
O2 Saturation: 88 %
O2 Saturation: 63 %
O2 Saturation: 71 %
O2 Saturation: 95 %
Patient temperature: 99.2
Patient temperature: 39.7
Patient temperature: 38.7
Patient temperature: 100.2
Potassium: 3.8 mmol/L (ref 3.5–5.1)
Potassium: 4.5 mmol/L (ref 3.5–5.1)
Potassium: 4.4 mmol/L (ref 3.5–5.1)
Potassium: 4.5 mmol/L (ref 3.5–5.1)
Sodium: 128 mmol/L — ABNORMAL LOW (ref 135–145)
Sodium: 130 mmol/L — ABNORMAL LOW (ref 135–145)
Sodium: 130 mmol/L — ABNORMAL LOW (ref 135–145)
Sodium: 130 mmol/L — ABNORMAL LOW (ref 135–145)
TCO2: 25 mmol/L (ref 22–32)
TCO2: 30 mmol/L (ref 22–32)
TCO2: 31 mmol/L (ref 22–32)
TCO2: 26 mmol/L (ref 22–32)
pCO2 arterial: 40.3 mmHg (ref 32–48)
pCO2 arterial: 81.5 mmHg (ref 32–48)
pCO2 arterial: 88.2 mmHg (ref 32–48)
pCO2 arterial: 45.1 mmHg (ref 32–48)
pH, Arterial: 7.381 (ref 7.35–7.45)
pH, Arterial: 7.152 — CL (ref 7.35–7.45)
pH, Arterial: 7.134 — CL (ref 7.35–7.45)
pH, Arterial: 7.35 (ref 7.35–7.45)
pO2, Arterial: 57 mmHg — ABNORMAL LOW (ref 83–108)
pO2, Arterial: 50 mmHg — ABNORMAL LOW (ref 83–108)
pO2, Arterial: 56 mmHg — ABNORMAL LOW (ref 83–108)
pO2, Arterial: 84 mmHg (ref 83–108)

## 2022-05-15 LAB — ECHOCARDIOGRAM LIMITED
AV Mean grad: 19 mmHg
AV Peak grad: 41.2 mmHg
Ao pk vel: 3.21 m/s
Area-P 1/2: 1.93 cm2
Est EF: >75
S' Lateral: 2 cm

## 2022-05-15 LAB — GLUCOSE, CAPILLARY
Glucose-Capillary: 102 mg/dL — ABNORMAL HIGH (ref 70–99)
Glucose-Capillary: 170 mg/dL — ABNORMAL HIGH (ref 70–99)
Glucose-Capillary: 179 mg/dL — ABNORMAL HIGH (ref 70–99)
Glucose-Capillary: 189 mg/dL — ABNORMAL HIGH (ref 70–99)
Glucose-Capillary: 196 mg/dL — ABNORMAL HIGH (ref 70–99)
Glucose-Capillary: 221 mg/dL — ABNORMAL HIGH (ref 70–99)
Glucose-Capillary: 78 mg/dL (ref 70–99)

## 2022-05-15 LAB — CBC
HCT: 32.5 % — ABNORMAL LOW (ref 36.0–46.0)
Hemoglobin: 11.1 g/dL — ABNORMAL LOW (ref 12.0–15.0)
MCH: 28.1 pg (ref 26.0–34.0)
MCHC: 34.2 g/dL (ref 30.0–36.0)
MCV: 82.3 fL (ref 80.0–100.0)
Platelets: 105 10*3/uL — ABNORMAL LOW (ref 150–400)
RBC: 3.95 MIL/uL (ref 3.87–5.11)
RDW: 18.1 % — ABNORMAL HIGH (ref 11.5–15.5)
WBC: 18.9 10*3/uL — ABNORMAL HIGH (ref 4.0–10.5)
nRBC: 0 % (ref 0.0–0.2)

## 2022-05-15 LAB — PHOSPHORUS: Phosphorus: 4.5 mg/dL (ref 2.5–4.6)

## 2022-05-15 LAB — COMPREHENSIVE METABOLIC PANEL
ALT: 51 U/L — ABNORMAL HIGH (ref 0–44)
AST: 124 U/L — ABNORMAL HIGH (ref 15–41)
Albumin: 1.8 g/dL — ABNORMAL LOW (ref 3.5–5.0)
Alkaline Phosphatase: 120 U/L (ref 38–126)
Anion gap: 16 — ABNORMAL HIGH (ref 5–15)
BUN: 70 mg/dL — ABNORMAL HIGH (ref 8–23)
CO2: 20 mmol/L — ABNORMAL LOW (ref 22–32)
Calcium: 8 mg/dL — ABNORMAL LOW (ref 8.9–10.3)
Chloride: 93 mmol/L — ABNORMAL LOW (ref 98–111)
Creatinine, Ser: 2.85 mg/dL — ABNORMAL HIGH (ref 0.44–1.00)
GFR, Estimated: 17 mL/min — ABNORMAL LOW (ref >60)
Glucose, Bld: 240 mg/dL — ABNORMAL HIGH (ref 70–99)
Potassium: 4 mmol/L (ref 3.5–5.1)
Sodium: 129 mmol/L — ABNORMAL LOW (ref 135–145)
Total Bilirubin: 2.2 mg/dL — ABNORMAL HIGH (ref 0.3–1.2)
Total Protein: 5.3 g/dL — ABNORMAL LOW (ref 6.5–8.1)

## 2022-05-15 LAB — HEPARIN LEVEL (UNFRACTIONATED): Heparin Unfractionated: 0.51 IU/mL (ref 0.30–0.70)

## 2022-05-15 LAB — CULTURE, RESPIRATORY W GRAM STAIN

## 2022-05-15 LAB — CULTURE, BLOOD (ROUTINE X 2): Special Requests: ADEQUATE

## 2022-05-15 LAB — POCT ACTIVATED CLOTTING TIME: Activated Clotting Time: 0 seconds

## 2022-05-15 LAB — MAGNESIUM: Magnesium: 2.6 mg/dL — ABNORMAL HIGH (ref 1.7–2.4)

## 2022-05-15 MED ORDER — STERILE WATER FOR INJECTION IJ SOLN
50.0000 ng/kg/min | INTRAVENOUS | Status: DC
  Administered 2022-05-15 – 2022-05-16 (×3): 50 ng/kg/min via RESPIRATORY_TRACT
  Filled 2022-05-15 (×3): qty 5

## 2022-05-15 MED ORDER — FENTANYL 2500MCG IN NS 250ML (10MCG/ML) PREMIX INFUSION
25.0000 ug/h | INTRAVENOUS | Status: DC
  Administered 2022-05-16: 200 ug/h via INTRAVENOUS
  Filled 2022-05-15: qty 250

## 2022-05-15 MED ORDER — FUROSEMIDE 10 MG/ML IJ SOLN
10.0000 mg/h | INTRAVENOUS | Status: DC
  Administered 2022-05-15: 10 mg/h via INTRAVENOUS
  Filled 2022-05-15 (×5): qty 20

## 2022-05-15 MED ORDER — DOCUSATE SODIUM 50 MG/5ML PO LIQD: 100.0000 mg | Freq: Two times a day (BID) | ORAL | Status: DC

## 2022-05-15 MED ORDER — SODIUM CHLORIDE 0.9 % IV SOLN
2.0000 g | INTRAVENOUS | Status: DC
  Administered 2022-05-15: 2 g via INTRAVENOUS
  Filled 2022-05-15: qty 12.5

## 2022-05-15 MED ORDER — VANCOMYCIN HCL 1500 MG/300ML IV SOLN
1500.0000 mg | Freq: Once | INTRAVENOUS | Status: AC
  Administered 2022-05-15: 1500 mg via INTRAVENOUS
  Filled 2022-05-15: qty 300

## 2022-05-15 MED ORDER — HEPARIN (PORCINE) 25000 UT/250ML-% IV SOLN
900.0000 [IU]/h | INTRAVENOUS | Status: DC
  Administered 2022-05-15: 1000 [IU]/h via INTRAVENOUS
  Filled 2022-05-15: qty 250

## 2022-05-15 MED ORDER — FENTANYL BOLUS VIA INFUSION
25.0000 ug | INTRAVENOUS | Status: DC | PRN
  Administered 2022-05-15: 25 ug via INTRAVENOUS
  Administered 2022-05-15 – 2022-05-16 (×2): 50 ug via INTRAVENOUS

## 2022-05-15 MED ORDER — ACETAMINOPHEN 325 MG PO TABS
650.0000 mg | ORAL_TABLET | Freq: Four times a day (QID) | ORAL | Status: DC | PRN
  Administered 2022-05-15 – 2022-05-16 (×3): 650 mg
  Filled 2022-05-15 (×3): qty 2

## 2022-05-15 MED ORDER — FENTANYL CITRATE PF 50 MCG/ML IJ SOSY
25.0000 ug | PREFILLED_SYRINGE | Freq: Once | INTRAMUSCULAR | Status: AC
  Administered 2022-05-15: 25 ug via INTRAVENOUS

## 2022-05-15 MED ORDER — FENTANYL 2500MCG IN NS 250ML (10MCG/ML) PREMIX INFUSION
INTRAVENOUS | Status: AC
  Administered 2022-05-15: 50 ug/h via INTRAVENOUS
  Filled 2022-05-15: qty 250

## 2022-05-15 MED ORDER — FUROSEMIDE 10 MG/ML IJ SOLN
100.0000 mg | Freq: Once | INTRAVENOUS | Status: AC
  Administered 2022-05-15: 100 mg via INTRAVENOUS
  Filled 2022-05-15: qty 10

## 2022-05-15 MED ORDER — ACETYLCYSTEINE 20 % IN SOLN
4.0000 mL | Freq: Two times a day (BID) | RESPIRATORY_TRACT | Status: DC
  Administered 2022-05-15: 4 mL via RESPIRATORY_TRACT
  Filled 2022-05-15 (×2): qty 4

## 2022-05-15 MED ORDER — MIDAZOLAM HCL 2 MG/2ML IJ SOLN
1.0000 mg | INTRAMUSCULAR | Status: DC | PRN
  Administered 2022-05-16 (×2): 2 mg via INTRAVENOUS
  Filled 2022-05-15 (×2): qty 2

## 2022-05-15 MED ORDER — INSULIN ASPART 100 UNIT/ML IJ SOLN
4.0000 [IU] | INTRAMUSCULAR | Status: DC
  Administered 2022-05-15 – 2022-05-16 (×6): 4 [IU] via SUBCUTANEOUS

## 2022-05-15 MED ORDER — INSULIN GLARGINE-YFGN 100 UNIT/ML ~~LOC~~ SOLN
5.0000 [IU] | Freq: Every day | SUBCUTANEOUS | Status: DC
  Administered 2022-05-15 – 2022-05-16 (×2): 5 [IU] via SUBCUTANEOUS
  Filled 2022-05-15 (×2): qty 0.05

## 2022-05-15 MED ORDER — HEPARIN BOLUS VIA INFUSION
2000.0000 [IU] | Freq: Once | INTRAVENOUS | Status: AC
  Administered 2022-05-15: 2000 [IU] via INTRAVENOUS
  Filled 2022-05-15: qty 2000

## 2022-05-15 NOTE — Progress Notes (Signed)
  Echocardiogram 2D Echocardiogram has been performed.  Leslie [name] 05/15/2022, 4:47 PM

## 2022-05-15 NOTE — Progress Notes (Signed)
ANTICOAGULATION CONSULT NOTE - Initial Consult  Pharmacy Consult for heparin Indication: r/o pulmonary embolus  Allergies  Allergen Reactions   Latex Other (See Comments)    Burns skin    Tape Other (See Comments) and Itching    Burns skin    Atorvastatin Other (See Comments)    myalgia myalgia    Patient Measurements: Height: '5\' 7"'$  (170.2 cm) Weight: 69.9 kg (154 lb 1.6 oz) IBW/kg (Calculated) : 61.6 Heparin Dosing Weight: 62 kg  Vital Signs: Temp: 101.7 F (38.7 C) (02/25 1530) Temp Source: Bladder (02/25 1521) BP: 114/60 (02/25 1530) Pulse Rate: 95 (02/25 1530)  Labs: Recent Labs    05/13/22 0224 05/13/22 0533 05/13/22 0753 05/13/22 1138 05/14/22 0444 05/14/22 1150 05/15/22 0131 05/15/22 0256 05/15/22 1343  HGB 12.1  --   --   --  13.0   < > 11.1* 11.6* 11.6*  HCT 36.5  --   --   --  40.5   < > 32.5* 34.0* 34.0*  PLT 138*  --   --   --  125*  --  105*  --   --   CREATININE 1.75*  --   --   --  2.17*  --  2.85*  --   --   TROPONINIHS 1,078* 988* 798* 754*  --   --   --   --   --    < > = values in this interval not displayed.    Estimated Creatinine Clearance: 16.6 mL/min (A) (by C-G formula based on SCr of 2.85 mg/dL (H)).   Medical History: Past Medical History:  Diagnosis Date   Back pain    DDD   Carotid artery occlusion    Cough    Diastolic heart failure (HCC)    Dry skin    Essential hypertension    History of bruising easily    History of colon polyps    History of migraine    History of pneumonia    Hyperlipidemia    PAD (peripheral artery disease) (HCC)    Panic attack    Rheumatoid arthritis(714.0)     Medications:  Medications Prior to Admission  Medication Sig Dispense Refill Last Dose   acetaminophen (TYLENOL) 325 MG tablet Take 2 tablets (650 mg total) by mouth every 6 (six) hours as needed for mild pain or moderate pain (or Fever >/= 101).   unknown   albuterol (VENTOLIN HFA) 108 (90 Base) MCG/ACT inhaler Inhale 2 puffs  into the lungs every 6 (six) hours as needed for wheezing or shortness of breath. INHALE 2 INHALATIONS INTO THE LUNGS EVERY 6 HOURS AS NEEDED FOR WHEEZING Strength: 108 (90 Base) MCG/ACT 54 g 6 05/02/2022   Artificial Tears ophthalmic solution Place 2 drops into both eyes as needed. 15 mL 12 unknown   aspirin EC 81 MG tablet Take 81 mg by mouth daily.   05/11/2022   furosemide (LASIX) 40 MG tablet Take 1 tablet (40 mg total) by mouth daily. 30 tablet 1 05/13/2022   ipratropium-albuterol (DUONEB) 0.5-2.5 (3) MG/3ML SOLN Take 3 mLs by nebulization every 4 (four) hours as needed (wheezing, coughing, SOB). 360 mL 2 04/24/2022   losartan (COZAAR) 25 MG tablet Take 1 tablet (25 mg total) by mouth daily. 30 tablet 1 05/03/2022   metoprolol succinate (TOPROL-XL) 25 MG 24 hr tablet Take 1 tablet (25 mg total) by mouth daily. 90 tablet 3 04/27/2022 at 1000   potassium chloride (KLOR-CON M) 10 MEQ tablet Take 1 tablet (  10 mEq total) by mouth daily. 30 tablet 1 05/14/2022   predniSONE (DELTASONE) 20 MG tablet Take 3 PO QAM x5days, 2 PO QAM x5days, 1 PO QAM x5days 30 tablet 0 04/22/2022   rosuvastatin (CRESTOR) 10 MG tablet TAKE 1 TABLET BY MOUTH ONCE A DAY. 90 tablet 2 05/07/2022   SYMBICORT 80-4.5 MCG/ACT inhaler INHALE 2 PUFFS TWICE DAILY 3 each 2 unknown   Infusions:   sodium chloride 10 mL/hr at 05/14/22 0300    ceFAZolin (ANCEF) IV Stopped (05/15/22 1052)   DOBUTamine 2.5 mcg/kg/min (05/15/22 1500)   epoprostenol (VELETRI) for inhalation 1.'5mg'$ /34m (30,000 ng/mL)     feeding supplement (OSMOLITE 1.5 CAL) 50 mL/hr at 05/15/22 1500   fentaNYL infusion INTRAVENOUS 200 mcg/hr (05/15/22 1500)   furosemide (LASIX) 200 mg in dextrose 5 % 100 mL (2 mg/mL) infusion 10 mg/hr (05/15/22 1500)   norepinephrine (LEVOPHED) Adult infusion 20 mcg/min (05/15/22 1500)   vasopressin Stopped (05/15/22 1031)    Assessment: [id number] who presented to the ED with SOB on 2/22. She is COVID+ and has MSSA bacteremia. Pharmacy consulted  to begin IV heparin to r/o PE. She was on SQ heparin with last dose at 14:35. AKI noted, SCr up 2.85 with baseline ~0.9-1.4. Hgb down to 11.6 and platelets are down to 105.    Goal of Therapy:  Heparin level 0.3-0.7 units/ml Monitor platelets by anticoagulation protocol: Yes   Plan:  Heparin 2000 unit IV bolus with recent SQ heparin Infuse heparin drip at 1000 units/hr 8 hr heparin level Daily heparin level and CBC Monitor for signs/symptoms of bleeding   Thank you for involving pharmacy in this patient's care.  JRenold Genta PharmD, BCPS Clinical Pharmacist Clinical phone for 05/15/2022 is [phone]/25/2024 3:59 PM

## 2022-05-15 NOTE — Progress Notes (Addendum)
NAME:  Leslie [name], MRN:  [id number], DOB:  1946/01/10, LOS: 3 ADMISSION DATE:  05/07/2022, CONSULTATION DATE:  05/13/22 REFERRING MD:  EDP, CHIEF COMPLAINT:  Acute hypoxic hypercarbic respiratory failure   History of Present Illness:  77 yo female, with pmh copd, hypertrophic CM and frequent hospitalizations. Presented after recent discharge on 05/05/22 for reportedly acutely decompensated heart failure. Per chart review pt originally called ems for sob and was given breathing nebulizers and improved. However subsequently pt decompensated and called EMS again which prompted transfer to ED.     Pt was initially noted to be in resp distress req NIV support but became poorly responsive on such and required intubation.  Aftter  pt intubated she was noted to be hypotensive and febrile to 105F. Septic work up ensued at osh and she was transferred to [facility] for further eval in light of her trop of reportedly > 1000. Pt presented on >20 mcg levo via piv, intubated on100% and 8 peep. She was unknowingly p ositive with COVID (prior to transfer).     History and ROS is unobtainable 2/2 pt's intubated and sedated condition.  Pertinent  Medical History  Active smoker COPD Hypertrophic cardiomyopathy Pulmonary hypertension HTN DM Recent d/c 05/05/22 for decompensated HF with resolution of R pleural effusion  Significant Hospital Events: Including procedures, antibiotic start and stop dates in addition to other pertinent events   2/22 transferred from OSH 2/23 admitted to ICU On fentanyl, versed for sedation On vasopressin and levophed CT head without acute abnormality CT chest with pulmonary edema, bilateral opacities, and small R pleural effusion Blood cultures positive for MSSA  Interim History / Subjective:  Mental status unchanged and vasopressors weaning. Significant increase in oxygen requirements.   Objective   Blood pressure (!) 143/69, pulse (!) 102, temperature (!) 100.8 F (38.2 C), resp.  rate (!) 27, height '5\' 7"'$  (1.702 m), weight 69.9 kg, SpO2 90 %. CVP:  [7 mmHg-14 mmHg] 14 mmHg  Vent Mode: PRVC FiO2 (%):  [55 %-100 %] 55 % Set Rate:  [22 bmp] 22 bmp Vt Set:  [490 mL] 490 mL PEEP:  [8 cmH20] 8 cmH20 Plateau Pressure:  [26 cmH20-29 cmH20] 27 cmH20  SPO2 96%   Intake/Output Summary (Last 24 hours) at 05/15/2022 0957 Last data filed at 05/15/2022 0900 Gross per 24 hour  Intake 2048.22 ml  Output 388 ml  Net 1660.22 ml    Filed Weights   05/14/22 0734 05/15/22 0405  Weight: 71.1 kg 69.9 kg    Examination: General: chronically ill woman, laying on bed on ventilator, moderate distress HENT:  MMM ETT in place Lungs: Diffuse rhonchi with purulent sputum  Cardiovascular: RRR 3/6 SEM. JVP to jaw, CVP 16 Abdomen: BS present, soft Extremities: No rashes, 1+ DP pulses, skin is cool to the touch, dry. No extremity edema Neuro: opens eyes to voice.  GU: Foley in place, with output  Ancillary tests personally reviewed:  ScvO2 78%, SaO2 99% - CI 3.2 (Fick),  SVR 949 on 2/24 Na 128, Creatinine 2.85 Bilateral interstitial disease right infiltrate.  Repeat cultures remain negative.  Assessment & Plan:   Acute hypoxic hypercarbic respiratory failure due to COVID/MSSA pneumonia COPD exacerbation Septic shock due to MSSA bacteria Atrial fibrillation AKI with ATN Acute encephalopathy Demand related ischemia Hypertrophic Cardiomyopathy Pulmonary HTN Caloric malnutrition DM  Plan: - Chest PT, mucolytic - No weaning today. Needs secretion mobilization - Wean off vasopressin and NE if possible. Continue dobutamine for RV support. - Attempt  to diurese to improve oxygenation - Keep off continuous sedation, allow her to wake up and will also help BP. - Hemodynamics adequately corrected. At this point, looks more like septic without significant cardiogenic component.  - Continued worsening renal function portends a poor prognosis. If doesn't improve soon, may be time to  reconsider goals of care.  - Taper antibiotics to cefazolin, repeat blood cultures to ensure blood sterilization.  - Continue current bronchodilators  Best Practice (right click and "Reselect all SmartList Selections" daily)   Diet/type: NPO tube feeds DVT prophylaxis: prophylactic heparin  GI prophylaxis: H2B Lines: Arterial Line Foley:  Yes, and it is still needed Code Status:  DNR Last date of multidisciplinary goals of care discussion: Nephew updated at bedside today.   CRITICAL CARE Performed by: Kipp Brood  Total critical care time: 40 minutes  Critical care time was exclusive of separately billable procedures and treating other patients.  Critical care was necessary to treat or prevent imminent or life-threatening deterioration.  Critical care was time spent personally by me on the following activities: development of treatment plan with patient and/or surrogate as well as nursing, discussions with consultants, evaluation of patient's response to treatment, examination of patient, obtaining history from patient or surrogate, ordering and performing treatments and interventions, ordering and review of laboratory studies, ordering and review of radiographic studies, pulse oximetry, re-evaluation of patient's condition and participation in multidisciplinary rounds.  Kipp Brood, MD [facility] ICU Physician [place]  Pager: [phone] Mobile: [phone] After hours: [phone].  05/15/2022, 9:57 AM

## 2022-05-15 NOTE — Progress Notes (Signed)
Pharmacy Antibiotic Note  Leslie [name] is a 77 y.o. female with MSSA bactermia and possible endocarditis on ancef . Pharmacy has been consulted for cefepime and vancomycin dosing.She is noted with AKI -WBC= 18.9 (down), tmax= 103.5 -SCr= 2.85 (BL 0.9-1.4)  Plan: -Cefepime 2gm IV q24h -Vancomycin '1500mg'$  IV x1 then will consider a random level on 06-01-22 -Will follow renal function, cultures and clinical progress    Height: '5\' 7"'$  (170.2 cm) Weight: 69.9 kg (154 lb 1.6 oz) IBW/kg (Calculated) : 61.6  Temp (24hrs), Avg:100.3 F (37.9 C), Min:99 F (37.2 C), Max:103.5 F (39.7 C)  Recent Labs  Lab 05/10/22 1526 05/01/2022 1744 05/02/2022 1919 05/13/22 0224 05/13/22 0533 05/13/22 0753 05/13/22 1138 05/13/22 1736 05/13/22 2025 05/14/22 0444 05/15/22 0131  WBC  --  31.0*  --  17.0*  --   --   --   --   --  27.9* 18.9*  CREATININE 1.33* 1.63*  --  1.75*  --   --   --   --   --  2.17* 2.85*  LATICACIDVEN  --  3.6*   < >  --  3.4* 3.6* 4.0* 2.9* 2.3*  --   --    < > = values in this interval not displayed.    Estimated Creatinine Clearance: 16.6 mL/min (A) (by C-G formula based on SCr of 2.85 mg/dL (H)).    Allergies  Allergen Reactions   Latex Other (See Comments)    Burns skin    Tape Other (See Comments) and Itching    Burns skin    Atorvastatin Other (See Comments)    myalgia myalgia    Antimicrobials this admission: Vanc 2/22 x 1  Cefepime 2/22 >> 2/23 Azithromycin 2/23>>2/23 Cefazolin 2/23 >> 2/25  Dose adjustments this admission:   Microbiology results: 2/23 TA cx: MSSA, abundant yeast 2/22 blood: BCID MSSA 2/22 resp panel: COVID positive (neg when tested 04/28/22)  2/23 MRSA PCR not detected  Strep pneumo urinary antigen negative  2/24 BCx >>ngtd 2/24 resp- GPC/pairs, clusters  Thank you for allowing pharmacy to be a part of this patient's care.  Hildred Laser, PharmD Clinical Pharmacist **Pharmacist phone directory can now be found on [email or web address] (PW  [email or web address]).  Listed under [place].

## 2022-05-15 NOTE — Progress Notes (Addendum)
1910 family at bedside patient on vent and multiple IV medications non responsive 2300 blood to oral cavity with oral care patient having blood run down out of mouth notified Elink and pharmacy labs sent, RT decreasing vent oxygen needs based off SP02  04/29/2022 0000  Hep level decreased 0130 blood clot removed from oral cavity with oral care moth still bleeding, patient eyes open and biting tube with oral care PRN medications given 0530 labs resulted HBG and PLT dropped patient still bleeding in oral cavity spoke with CC Dr repeat CBC at 8 am  with other 8 am labs

## 2022-05-15 NOTE — Progress Notes (Addendum)
Pharmacy ICU Insulin Management  CBGs above goal 140-180 mg/dL: Yes  Current regimen (include units of SSI in last 24hr): moderate SSI and insulin aspart 2 units every 4 hours.   Medications affecting CBG levels: Continuous tube feeds + Decadron 10 BID  Plan: Increase insulin aspart to 4 units every 4 hours.  Add Semglee 5 units daily.  Continue moderate SSI.  Monitor CBGs every 4 hours.   Sloan Leiter, PharmD, BCPS, BCCCP Clinical Pharmacist Please refer to [facility] for [facility] Pharmacy numbers

## 2022-05-15 NOTE — Progress Notes (Signed)
[facility] for heparin Indication: r/o pulmonary embolus Brief A/P: Heparin level within goal range Continue Heparin at current rate   Allergies  Allergen Reactions   Latex Other (See Comments)    Burns skin    Tape Other (See Comments) and Itching    Burns skin    Atorvastatin Other (See Comments)    myalgia myalgia    Patient Measurements: Height: '5\' 7"'$  (170.2 cm) Weight: 69.9 kg (154 lb 1.6 oz) IBW/kg (Calculated) : 61.6 Heparin Dosing Weight: 62 kg  Vital Signs: Temp: 100.4 F (38 C) (02/25 2200) Temp Source: Bladder (02/25 1915) BP: 100/46 (02/25 2200) Pulse Rate: 66 (02/25 2200)  Labs: Recent Labs    05/13/22 0224 05/13/22 0533 05/13/22 0753 05/13/22 1138 05/14/22 0444 05/14/22 1150 05/15/22 0131 05/15/22 0256 05/15/22 1343 05/15/22 1613 05/15/22 1811 05/15/22 2311  HGB 12.1  --   --   --  13.0   < > 11.1*   < > 11.6* 12.2 11.6*  --   HCT 36.5  --   --   --  40.5   < > 32.5*   < > 34.0* 36.0 34.0*  --   PLT 138*  --   --   --  125*  --  105*  --   --   --   --   --   HEPARINUNFRC  --   --   --   --   --   --   --   --   --   --   --  0.51  CREATININE 1.75*  --   --   --  2.17*  --  2.85*  --   --   --   --   --   TROPONINIHS 1,078* 988* 798* 754*  --   --   --   --   --   --   --   --    < > = values in this interval not displayed.     Estimated Creatinine Clearance: 16.6 mL/min (A) (by C-G formula based on SCr of 2.85 mg/dL (H)).  Assessment: 77 y.o. female with SOB and possible PE for heparin.  RN noted bleeding from mouth tonight  Goal of Therapy:  Heparin level 0.3-0.7 units/ml Monitor platelets by anticoagulation protocol: Yes   Plan:  Decrease Heparin 900 units/hr to keep toward lower end of goal Check heparin level in 8 hours.   Phillis Knack, PharmD, BCPS  05/15/2022 11:35 PM

## 2022-05-16 LAB — CBC
HCT: 27.4 % — ABNORMAL LOW (ref 36.0–46.0)
HCT: 25.6 % — ABNORMAL LOW (ref 36.0–46.0)
Hemoglobin: 9.2 g/dL — ABNORMAL LOW (ref 12.0–15.0)
Hemoglobin: 8.9 g/dL — ABNORMAL LOW (ref 12.0–15.0)
MCH: 27.3 pg (ref 26.0–34.0)
MCH: 27.9 pg (ref 26.0–34.0)
MCHC: 33.6 g/dL (ref 30.0–36.0)
MCHC: 34.8 g/dL (ref 30.0–36.0)
MCV: 81.3 fL (ref 80.0–100.0)
MCV: 80.3 fL (ref 80.0–100.0)
Platelets: 84 10*3/uL — ABNORMAL LOW (ref 150–400)
Platelets: 81 10*3/uL — ABNORMAL LOW (ref 150–400)
RBC: 3.37 MIL/uL — ABNORMAL LOW (ref 3.87–5.11)
RBC: 3.19 MIL/uL — ABNORMAL LOW (ref 3.87–5.11)
RDW: 18.4 % — ABNORMAL HIGH (ref 11.5–15.5)
RDW: 18.5 % — ABNORMAL HIGH (ref 11.5–15.5)
WBC: 19.6 10*3/uL — ABNORMAL HIGH (ref 4.0–10.5)
WBC: 18.1 10*3/uL — ABNORMAL HIGH (ref 4.0–10.5)
nRBC: 0 % (ref 0.0–0.2)
nRBC: 0.2 % (ref 0.0–0.2)

## 2022-05-16 LAB — COMPREHENSIVE METABOLIC PANEL
ALT: 7 U/L (ref 0–44)
AST: 74 U/L — ABNORMAL HIGH (ref 15–41)
Albumin: 1.5 g/dL — ABNORMAL LOW (ref 3.5–5.0)
Alkaline Phosphatase: 110 U/L (ref 38–126)
Anion gap: 17 — ABNORMAL HIGH (ref 5–15)
BUN: 97 mg/dL — ABNORMAL HIGH (ref 8–23)
CO2: 21 mmol/L — ABNORMAL LOW (ref 22–32)
Calcium: 7.9 mg/dL — ABNORMAL LOW (ref 8.9–10.3)
Chloride: 91 mmol/L — ABNORMAL LOW (ref 98–111)
Creatinine, Ser: 3.73 mg/dL — ABNORMAL HIGH (ref 0.44–1.00)
GFR, Estimated: 12 mL/min — ABNORMAL LOW (ref >60)
Glucose, Bld: 206 mg/dL — ABNORMAL HIGH (ref 70–99)
Potassium: 4.5 mmol/L (ref 3.5–5.1)
Sodium: 129 mmol/L — ABNORMAL LOW (ref 135–145)
Total Bilirubin: 1.8 mg/dL — ABNORMAL HIGH (ref 0.3–1.2)
Total Protein: 4.9 g/dL — ABNORMAL LOW (ref 6.5–8.1)

## 2022-05-16 LAB — CULTURE, RESPIRATORY W GRAM STAIN

## 2022-05-16 LAB — GLUCOSE, CAPILLARY
Glucose-Capillary: 201 mg/dL — ABNORMAL HIGH (ref 70–99)
Glucose-Capillary: 193 mg/dL — ABNORMAL HIGH (ref 70–99)

## 2022-05-16 LAB — EXPECTORATED SPUTUM ASSESSMENT W GRAM STAIN, RFLX TO RESP C

## 2022-05-16 MED ORDER — MIDAZOLAM HCL 2 MG/2ML IJ SOLN: 2.0000 mg | INTRAMUSCULAR | Status: DC | PRN

## 2022-05-16 MED ORDER — GLYCOPYRROLATE 0.2 MG/ML IJ SOLN: 0.2000 mg | INTRAMUSCULAR | Status: DC | PRN

## 2022-05-16 MED ORDER — FENTANYL BOLUS VIA INFUSION: 100.0000 ug | INTRAVENOUS | Status: DC | PRN

## 2022-05-16 MED ORDER — GLYCOPYRROLATE 1 MG PO TABS: 1.0000 mg | ORAL_TABLET | ORAL | Status: DC | PRN

## 2022-05-16 MED ORDER — ACETAMINOPHEN 325 MG PO TABS: 650.0000 mg | ORAL_TABLET | Freq: Four times a day (QID) | ORAL | Status: DC | PRN

## 2022-05-16 MED ORDER — POLYVINYL ALCOHOL 1.4 % OP SOLN: 1.0000 [drp] | Freq: Four times a day (QID) | OPHTHALMIC | Status: DC | PRN

## 2022-05-16 MED ORDER — FENTANYL 2500MCG IN NS 250ML (10MCG/ML) PREMIX INFUSION: 0.0000 ug/h | INTRAVENOUS | Status: DC

## 2022-05-16 MED ORDER — FAMOTIDINE 20 MG PO TABS: 10.0000 mg | ORAL_TABLET | Freq: Every day | ORAL | Status: DC

## 2022-05-16 MED ORDER — ACETAMINOPHEN 650 MG RE SUPP: 650.0000 mg | Freq: Four times a day (QID) | RECTAL | Status: DC | PRN

## 2022-05-16 MED ORDER — VANCOMYCIN VARIABLE DOSE PER UNSTABLE RENAL FUNCTION (PHARMACIST DOSING): Status: DC

## 2022-05-17 LAB — CULTURE, BLOOD (ROUTINE X 2): Culture: NO GROWTH

## 2022-05-17 LAB — LEGIONELLA PNEUMOPHILA SEROGP 1 UR AG: L. pneumophila Serogp 1 Ur Ag: NEGATIVE

## 2022-05-19 ENCOUNTER — Inpatient Hospital Stay: Payer: Medicare HMO | Admitting: Internal Medicine

## 2022-05-19 LAB — CULTURE, BLOOD (ROUTINE X 2)
Culture: NO GROWTH
Culture: NO GROWTH

## 2022-05-20 NOTE — Progress Notes (Incomplete)
[facility] for Infectious Disease  Date of Admission:  04/27/2022   Total days of inpatient antibiotics ***  Principal Problem:   Respiratory failure with hypoxia (HCC) Active Problems:   Acute hypoxemic respiratory failure due to COVID-19 (HCC)   Shock (HCC)   Malnutrition of moderate degree          Assessment: 77 year old female with history of hypertrophic cardiomyopathy, diabetes mellitus, COPD admitted with acute  respiratory failure found to be COVID-positive.   #MSSA bacteremia with concern for superimposed bacterial pneumonia #COVID-19 infection #Intubated - Recent admission and discharged on 2/15 for acute, decompensated heart failure. Per EMR review there was right post forearm PIV, no phlebitis noted on my exam.  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  Noted patchy consolidative airspace opacities more prominent within the left lower lobe. - Pro-Cal 76.5K Recommendations:  -Repeat blood Cx to ensure clearance -Continue cefazolin -TTE, given abnormalities seen on prebious TTE and MSSA bacteremia will likely need TEE -Would engage cards given abnormal TTE on 04/23/22 - Legionella urine/strep Ag - Sputum cultures  Microbiology:   Antibiotics:  Cultures: Blood  Urine  Other   SUBJECTIVE: *** Interval: 103.5, wbc 19.6k  Review of Systems: ROS   Scheduled Meds:  Chlorhexidine Gluconate Cloth  6 each Topical Q0600   dexamethasone (DECADRON) injection  10 mg Intravenous Q12H   docusate  100 mg Per Tube BID   famotidine  10 mg Oral Daily   feeding supplement (PROSource TF20)  60 mL Per Tube Daily   insulin aspart  0-15 Units Subcutaneous Q4H   insulin aspart  4 Units Subcutaneous Q4H   insulin  glargine-yfgn  5 Units Subcutaneous Daily   mouth rinse  15 mL Mouth Rinse Q2H   polyethylene glycol  17 g Per Tube Daily   vancomycin variable dose per unstable renal function (pharmacist dosing)   Does not apply See admin instructions   Continuous Infusions:  sodium chloride 10 mL/hr at 05/14/22 0300   ceFEPime (MAXIPIME) IV Stopped (05/15/22 1902)   DOBUTamine Stopped (05/15/22 1605)   epoprostenol (VELETRI) for inhalation 1.'5mg'$ /64m (30,000 ng/mL) 50 ng/kg/min (003-14-20240018)   feeding supplement (OSMOLITE 1.5 CAL) 50 mL/hr at 003/14/240800   fentaNYL infusion INTRAVENOUS 200 mcg/hr (003/14/20240800)   furosemide (LASIX) 200 mg in dextrose 5 % 100 mL (2 mg/mL) infusion 10 mg/hr (02024/03/140800)   norepinephrine (LEVOPHED) Adult infusion 5 mcg/min (0Mar 14, 20240800)   vasopressin 0.03 Units/min (003-14-20240800)   PRN Meds:.sodium chloride, acetaminophen, fentaNYL, ipratropium-albuterol, midazolam, mouth rinse, white petrolatum Allergies  Allergen Reactions   Latex Other (See Comments)    Burns skin    Tape Other (See Comments) and Itching    Burns skin    Atorvastatin Other (See Comments)    myalgia myalgia    OBJECTIVE: Vitals:   003/14/240644 02024/03/140736 003-14-240739 003-14-20240800  BP:   98/61 93/61  Pulse:   75 72  Resp:   (!) 28 (!) 28  Temp:  (!) 100.4 F (38 C)  (!) 100.4 F (38 C)  TempSrc:    Bladder  SpO2: 97%   95%  Weight:      Height:       Body mass index is 25.1 kg/m.  Physical  Exam    Lab Results Lab Results  Component Value Date   WBC 19.6 (H) 2022-05-31   HGB 9.2 (L) 05/31/22   HCT 27.4 (L) 2022/05/31   MCV 81.3 May 31, 2022   PLT 84 (L) 05-31-2022    Lab Results  Component Value Date   CREATININE 3.73 (H) 05/31/2022   BUN 97 (H) 2022/05/31   NA 129 (L) 31-May-2022   K 4.5 May 31, 2022   CL 91 (L) 2022-05-31   CO2 21 (L) 05-31-22    Lab Results  Component Value Date   ALT 7 31-May-2022   AST 74 (H) 05-31-2022   ALKPHOS 110  May 31, 2022   BILITOT 1.8 (H) 2022/05/31        Laurice Record, [facility] 05-31-22, 8:37 AM

## 2022-05-20 NOTE — Progress Notes (Signed)
Patient admitted for respiratory failure.  Patient currently intubated.  TOC following.

## 2022-05-20 NOTE — Progress Notes (Signed)
[facility] Progress Note Patient Name: KAITLAND LABELLE DOB: 1945-09-05 MRN: [id number]   Date of Service  06/01/2022  HPI/Events of Note  AM labs Hgb 9.2 (was 11.1) and Platelets 84 (was 105). Pt on hepartin drip @ 900 u for suspected P.E. and BSRN noting bleding from mouth with mouthcare.    Discussed with RN. Pharmacy decreased heparin drip at 9 from 11. No bleeding from ET or NG tube  On levo 6, Vaso 0.03   eICU Interventions  Cbc at 8 AM to follow through. For now watch.      Intervention Category Intermediate Interventions: Bleeding - evaluation and treatment with blood products  Elmer Sow 01-Jun-2022, 5:32 AM

## 2022-05-20 NOTE — Death Summary Note (Signed)
  Name: Leslie [name] MRN: [id number] DOB: 06/04/1945 77 y.o.  Date of Admission: 04/24/2022  5:19 PM Date of Discharge: 06-04-2022 Attending Physician: Kipp Brood, MD  Discharge Diagnosis: Principal Problem:   Respiratory failure with hypoxia [facility]) Active Problems:   Acute hypoxemic respiratory failure due to COVID-19 ([place])   Shock ([place])   Malnutrition of moderate degree   Cause of death: cardiac arrest Time of death: 1300  Disposition and follow-up:   [name] was discharged from [facility] in expired condition.    Hospital Course: [name], a 77 yo female with COPD, hypertrophic cardiomyopathy, pulmonary hypertension, HTN, DM, presented to OSH with shortness of breath, found to be COVID positive, and was admitted to the [facility] ICU for septic shock in setting MSSA pneumonia/bacteremia. Given severity of comorbid conditions, pressor and ventilatory support to achieve hemodynamic stability, and progression of kidney failure, patient's family opted to transition [name]'s status to comfort care. Patient was extubated at 1253 and expired at 61 with family at bedside.   Signed: Romana Juniper, MD 06/04/2022, 2:13 PM

## 2022-05-20 NOTE — Procedures (Signed)
Extubation Procedure Note  Patient Details:   Name: [name] DOB: 04-Dec-1945 MRN: [id number]   Airway Documentation:    Vent end date: 2022/06/06 Vent end time: 1253   Evaluation  O2 sats: stable throughout Complications: No apparent complications Patient did tolerate procedure well. Bilateral Breath Sounds: Rhonchi, Diminished   No  Pt. Extubated to comfort care.   Whitman Hero 2022/06/06, 12:54 PM

## 2022-05-20 NOTE — Progress Notes (Signed)
Inpatient Diabetes Program Recommendations  AACE/ADA: New Consensus Statement on Inpatient Glycemic Control (2015)  Target Ranges:  Prepandial:   less than 140 mg/dL      Peak postprandial:   less than 180 mg/dL (1-2 hours)      Critically ill patients:  140 - 180 mg/dL   Lab Results  Component Value Date   GLUCAP 193 (H) 06-04-22   HGBA1C 6.3 (H) 04/22/2022    Review of Glycemic Control  Latest Reference Range & Units 05/15/22 19:33 05/15/22 23:16 05/15/22 23:56 06-04-2022 04:12 2022/06/04 07:29  Glucose-Capillary 70 - 99 mg/dL 78 170 (H) 196 (H) 201 (H) 193 (H)   Diabetes history: None  Current orders for Inpatient glycemic control:  Novolog 0-15 units q 4 hours Novolog 4 units q 4 hours Decadron 10 mg IV q 12 hours Osmolite 1.5 cal- 50 ml/hr Semglee 5 units daily Inpatient Diabetes Program Recommendations:    Consider increasing Semglee to 12 units daily.   Thanks,  Adah Perl, RN, BC-ADM Inpatient Diabetes Coordinator Pager [phone]  (8a-5p)

## 2022-05-20 NOTE — Progress Notes (Signed)
NAME:  Leslie [name], MRN:  [id number], DOB:  Jan 13, 1946, LOS: 4 ADMISSION DATE:  05/05/2022, CONSULTATION DATE:  05/13/22 REFERRING MD:  EDP, CHIEF COMPLAINT:  Acute hypoxic hypercarbic respiratory failure   History of Present Illness:  77 yo female, with pmh copd, hypertrophic CM and frequent hospitalizations. Presented after recent discharge on 05/05/22 for reportedly acutely decompensated heart failure. Per chart review pt originally called ems for sob and was given breathing nebulizers and improved. However subsequently pt decompensated and called EMS again which prompted transfer to ED.     Pt was initially noted to be in resp distress req NIV support but became poorly responsive on such and required intubation.  Aftter  pt intubated she was noted to be hypotensive and febrile to 105F. Septic work up ensued at osh and she was transferred to [facility] for further eval in light of her trop of reportedly > 1000. Pt presented on >20 mcg levo via piv, intubated on100% and 8 peep. She was unknowingly p ositive with COVID (prior to transfer).     History and ROS is unobtainable 2/2 pt's intubated and sedated condition.  Pertinent  Medical History  Active smoker COPD Hypertrophic cardiomyopathy Pulmonary hypertension HTN DM Recent d/c 05/05/22 for decompensated HF with resolution of R pleural effusion  Significant Hospital Events: Including procedures, antibiotic start and stop dates in addition to other pertinent events   2/22 transferred from OSH 2/23 admitted to ICU On fentanyl, versed for sedation On vasopressin and levophed CT head without acute abnormality CT chest with pulmonary edema, bilateral opacities, and small R pleural effusion Blood cultures positive for MSSA [id number] TTE systolic anterior motion of the MV, MR. LVEF >75% with hyperdynamic LV. Concentric LV hypertrophy with flattening of the interventricular septum during systole and diastole. Severely elevated PA pressure. NO evidence  of vegetations Vancomycin cefepime -> Cefazolin per susceptibilities Atrial fibrillation and concern for pulmonary embolism 2/25 started on heparin drip 2/25 started on veletri  Interim History / Subjective:  Drop in hemoglobin from 11.1 to 9.2; heparin level decreased overnight Bleeding per mouth; turned heparin off Family at beside willing to re-engage in [place] conversation given clinical decline  Objective   Blood pressure 120/66, pulse 75, temperature (!) 100.4 F (38 C), resp. rate (!) 28, height '5\' 7"'$  (1.702 m), weight 72.7 kg, SpO2 97 %. CVP:  [6 mmHg-22 mmHg] 7 mmHg   Vent Mode: PRVC FiO2 (%):  [60 %-100 %] 60 % Set Rate:  [22 bmp-28 bmp] 28 bmp Vt Set:  [490 mL] 490 mL PEEP:  [10 cmH20] 10 cmH20   Intake/Output Summary (Last 24 hours) at 2022/05/20 0729 Last data filed at 20-May-2022 0616 Gross per 24 hour  Intake 2560.39 ml  Output 303 ml  Net 2257.39 ml   Filed Weights   05/14/22 0734 05/15/22 0405 May 20, 2022 0420  Weight: 71.1 kg 69.9 kg 72.7 kg    Examination: General: chronically ill woman, laying on bed on ventilator in moderate distress HENT:  MMM ETT in place, dry blood around tube.  Lungs: rhochi and crackles throughout Cardiovascular: RRR, systolic murmur Abdomen: BS present, soft Extremities: No rashes, 1+ DP pulses, skin is cool to the touch, dry. 2+ edema Neuro: opening eyes intermittently GU: Foley in place, with minimal output and dark urine in bag  Ancillary tests personally reviewed:  Cr. 3.73 WBC 198.2 Hgb 9.2 PLT 85  TTE systolic anterior motion of the MV, MR. LVEF >75% with hyperdynamic LV. Concentric LV hypertrophy with flattening  of the interventricular septum during systole and diastole. Severely elevated PA pressure. NO evidence of vegetations  Assessment & Plan:   Acute hypoxic hypercarbic respiratory failure due to COVID/MSSA pneumonia COPD exacerbation Septic shock due to MSSA bacteremia AKI likely secondary of ATN in setting of  septic shock Anemia Acute encephalopathy Demand related ischemia Hypertrophic Cardiomyopathy Pulmonary HTN Caloric malnutrition DM Patient with continued need for ventilatory support in setting of MSSA bacteremia, with worsening renal function despite ongoing pressor support and hemodynamic stability, fluid management, and diuretics. Given the poor prognosis and patient's own wishes for independence, patient's family convened a meeting to discuss goals of care. Both daughters and grandson were at bedside. After reviewing the clinical timeline and today's clinical status, patient's daughter ultimately decided to transition Mrs. Whitner's care to comfort only. They expressed no other family members would be able to come to bedside. With this information, I placed orders in.   Best Practice (right click and "Reselect all SmartList Selections" daily)   Diet/type: NPO  DVT prophylaxis: order to remove GI prophylaxis: H2B Lines: Arterial Line and No longer needed.  Order written to d/c  Foley:  Yes, and it is still needed Code Status:  DNR Last date of multidisciplinary goals of care discussion: Nephew updated at bedside today.   Romana Juniper, MD [facility] Internal Medicine Program - PGY-1 13-Jun-2022, 12:18 PM

## 2022-05-20 DEATH — deceased

## 2022-05-26 ENCOUNTER — Encounter: Payer: Medicare HMO | Admitting: *Deleted

## 2022-06-02 ENCOUNTER — Ambulatory Visit: Payer: Medicare HMO | Admitting: Cardiology

## 2022-06-03 ENCOUNTER — Ambulatory Visit (HOSPITAL_COMMUNITY): Payer: Medicare HMO

## 2022-06-07 ENCOUNTER — Ambulatory Visit: Payer: Medicare HMO | Admitting: Family Medicine

## 2022-06-22 ENCOUNTER — Telehealth: Payer: Self-pay | Admitting: Family Medicine

## 2022-06-22 NOTE — Telephone Encounter (Signed)
Leslie [name] [facility], [phone]  Called stating that they had sent orders for pt but never got them back. They can't close out her chart info till they receive this. Can you please look into this? She is refaxing this.

## 2022-07-06 ENCOUNTER — Ambulatory Visit: Payer: Medicare HMO | Admitting: Family Medicine
